# Patient Record
Sex: Female | Born: 1970 | Race: White | Hispanic: No | Marital: Married | Smoking: Never smoker
Health system: Southern US, Community
[De-identification: ages and names within clinical notes are randomized; demographics above are authoritative.]

## PROBLEM LIST (undated history)

## (undated) DIAGNOSIS — T4145XA Adverse effect of unspecified anesthetic, initial encounter: Secondary | ICD-10-CM

## (undated) DIAGNOSIS — Z923 Personal history of irradiation: Secondary | ICD-10-CM

## (undated) DIAGNOSIS — T8859XA Other complications of anesthesia, initial encounter: Secondary | ICD-10-CM

## (undated) DIAGNOSIS — F419 Anxiety disorder, unspecified: Secondary | ICD-10-CM

## (undated) DIAGNOSIS — M1990 Unspecified osteoarthritis, unspecified site: Secondary | ICD-10-CM

## (undated) DIAGNOSIS — C50919 Malignant neoplasm of unspecified site of unspecified female breast: Secondary | ICD-10-CM

## (undated) DIAGNOSIS — D689 Coagulation defect, unspecified: Secondary | ICD-10-CM

## (undated) DIAGNOSIS — Z9221 Personal history of antineoplastic chemotherapy: Secondary | ICD-10-CM

## (undated) DIAGNOSIS — D649 Anemia, unspecified: Secondary | ICD-10-CM

## (undated) DIAGNOSIS — F32A Depression, unspecified: Secondary | ICD-10-CM

## (undated) DIAGNOSIS — T7840XA Allergy, unspecified, initial encounter: Secondary | ICD-10-CM

## (undated) DIAGNOSIS — G809 Cerebral palsy, unspecified: Secondary | ICD-10-CM

## (undated) HISTORY — DX: Depression, unspecified: F32.A

## (undated) HISTORY — DX: Personal history of irradiation: Z92.3

## (undated) HISTORY — PX: EYE SURGERY: SHX253

## (undated) HISTORY — DX: Anemia, unspecified: D64.9

## (undated) HISTORY — DX: Allergy, unspecified, initial encounter: T78.40XA

## (undated) HISTORY — PX: LEG SURGERY: SHX1003

## (undated) HISTORY — DX: Malignant neoplasm of unspecified site of unspecified female breast: C50.919

## (undated) HISTORY — DX: Coagulation defect, unspecified: D68.9

## (undated) HISTORY — DX: Unspecified osteoarthritis, unspecified site: M19.90

## (undated) HISTORY — PX: BREAST BIOPSY: SHX20

---

## 2000-10-26 ENCOUNTER — Encounter: Admission: RE | Admit: 2000-10-26 | Discharge: 2000-10-26 | Payer: Self-pay | Admitting: Internal Medicine

## 2000-10-26 ENCOUNTER — Encounter: Payer: Self-pay | Admitting: Internal Medicine

## 2001-04-04 ENCOUNTER — Ambulatory Visit (HOSPITAL_COMMUNITY): Admission: RE | Admit: 2001-04-04 | Discharge: 2001-04-04 | Payer: Self-pay | Admitting: Obstetrics and Gynecology

## 2001-12-31 ENCOUNTER — Ambulatory Visit (HOSPITAL_COMMUNITY): Admission: RE | Admit: 2001-12-31 | Discharge: 2001-12-31 | Payer: Self-pay | Admitting: Obstetrics and Gynecology

## 2002-01-29 ENCOUNTER — Ambulatory Visit (HOSPITAL_COMMUNITY): Admission: RE | Admit: 2002-01-29 | Discharge: 2002-01-29 | Payer: Self-pay | Admitting: Obstetrics and Gynecology

## 2002-01-29 ENCOUNTER — Encounter: Payer: Self-pay | Admitting: Obstetrics and Gynecology

## 2003-05-18 ENCOUNTER — Ambulatory Visit (HOSPITAL_COMMUNITY): Admission: RE | Admit: 2003-05-18 | Discharge: 2003-05-18 | Payer: Self-pay | Admitting: Obstetrics and Gynecology

## 2004-07-03 HISTORY — PX: ABDOMINAL HYSTERECTOMY: SHX81

## 2006-03-08 ENCOUNTER — Encounter: Admission: RE | Admit: 2006-03-08 | Discharge: 2006-04-12 | Payer: Self-pay | Admitting: Family Medicine

## 2011-11-29 ENCOUNTER — Other Ambulatory Visit (HOSPITAL_COMMUNITY): Payer: Self-pay | Admitting: Family Medicine

## 2011-11-29 DIAGNOSIS — Z1231 Encounter for screening mammogram for malignant neoplasm of breast: Secondary | ICD-10-CM

## 2011-12-21 ENCOUNTER — Ambulatory Visit (HOSPITAL_COMMUNITY): Payer: Self-pay

## 2012-01-15 ENCOUNTER — Ambulatory Visit (HOSPITAL_COMMUNITY)
Admission: RE | Admit: 2012-01-15 | Discharge: 2012-01-15 | Disposition: A | Discharge status: Home / Self Care | Payer: PRIVATE HEALTH INSURANCE | Source: Ambulatory Visit | Attending: Family Medicine | Admitting: Family Medicine

## 2012-01-15 DIAGNOSIS — Z1231 Encounter for screening mammogram for malignant neoplasm of breast: Secondary | ICD-10-CM | POA: Insufficient documentation

## 2012-01-22 ENCOUNTER — Other Ambulatory Visit: Payer: Self-pay | Admitting: Family Medicine

## 2012-01-22 DIAGNOSIS — R928 Other abnormal and inconclusive findings on diagnostic imaging of breast: Secondary | ICD-10-CM

## 2012-02-01 ENCOUNTER — Other Ambulatory Visit: Payer: Self-pay | Admitting: Family Medicine

## 2012-02-01 ENCOUNTER — Ambulatory Visit
Admission: RE | Admit: 2012-02-01 | Discharge: 2012-02-01 | Disposition: A | Discharge status: Home / Self Care | Payer: PRIVATE HEALTH INSURANCE | Source: Ambulatory Visit | Attending: Family Medicine | Admitting: Family Medicine

## 2012-02-01 DIAGNOSIS — R928 Other abnormal and inconclusive findings on diagnostic imaging of breast: Secondary | ICD-10-CM

## 2012-02-02 ENCOUNTER — Other Ambulatory Visit: Payer: Self-pay | Admitting: Family Medicine

## 2012-02-02 ENCOUNTER — Ambulatory Visit
Admission: RE | Admit: 2012-02-02 | Discharge: 2012-02-02 | Disposition: A | Discharge status: Home / Self Care | Payer: PRIVATE HEALTH INSURANCE | Source: Ambulatory Visit | Attending: Family Medicine | Admitting: Family Medicine

## 2012-02-02 DIAGNOSIS — C50912 Malignant neoplasm of unspecified site of left female breast: Secondary | ICD-10-CM

## 2012-02-02 DIAGNOSIS — R928 Other abnormal and inconclusive findings on diagnostic imaging of breast: Secondary | ICD-10-CM

## 2012-02-06 ENCOUNTER — Telehealth (INDEPENDENT_AMBULATORY_CARE_PROVIDER_SITE_OTHER): Payer: Self-pay

## 2012-02-06 NOTE — Telephone Encounter (Signed)
Spoke to pt about moving her appt up earlier with Dr Dwain Sarna for 8/7 instead of 8/12. The pt will look at her schedule and get back in touch with me. I told the pt that I wouldn't change anything until she got back in touch with me.

## 2012-02-06 NOTE — Telephone Encounter (Signed)
Pt returned my call and she would like to keep her appt a lone for the 8/12. I notified Dr Dwain Sarna.

## 2012-02-08 ENCOUNTER — Other Ambulatory Visit: Payer: PRIVATE HEALTH INSURANCE

## 2012-02-11 ENCOUNTER — Ambulatory Visit
Admission: RE | Admit: 2012-02-11 | Discharge: 2012-02-11 | Disposition: A | Discharge status: Home / Self Care | Payer: PRIVATE HEALTH INSURANCE | Source: Ambulatory Visit | Attending: Family Medicine | Admitting: Family Medicine

## 2012-02-11 DIAGNOSIS — C50912 Malignant neoplasm of unspecified site of left female breast: Secondary | ICD-10-CM

## 2012-02-11 MED ORDER — GADOBENATE DIMEGLUMINE 529 MG/ML IV SOLN
14.0000 mL | Freq: Once | INTRAVENOUS | Status: AC | PRN
  Administered 2012-02-11: 14 mL via INTRAVENOUS

## 2012-02-12 ENCOUNTER — Encounter (INDEPENDENT_AMBULATORY_CARE_PROVIDER_SITE_OTHER): Payer: Self-pay | Admitting: General Surgery

## 2012-02-12 ENCOUNTER — Ambulatory Visit (INDEPENDENT_AMBULATORY_CARE_PROVIDER_SITE_OTHER): Payer: PRIVATE HEALTH INSURANCE | Admitting: General Surgery

## 2012-02-12 VITALS — BP 130/70 | HR 88 | Temp 98.0°F | Resp 12 | Ht 60.0 in | Wt 157.2 lb

## 2012-02-12 DIAGNOSIS — C50419 Malignant neoplasm of upper-outer quadrant of unspecified female breast: Secondary | ICD-10-CM

## 2012-02-12 NOTE — Progress Notes (Signed)
Patient ID: Alicia Pena, female   DOB: 1970/07/13, 41 y.o.   MRN: [id number]  Chief Complaint  Patient presents with  . Other    eval lt brca    HPI Alicia Pena is a 41 y.o. female.  Referred by Dr. Guinevere Ferrari HPI 41 yof who is otherwise healthy has had left breast upper outer quadrant dimpling since January. This has gotten worse over this time.  She underwent her first mmg recently with finding of a left breast spiculated mass.  She underwent more mmg with no other findings.  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  She has also undergone mr that shows multiple other masses on both sides but no adenopathy. She comes in today to discuss her options.  Past Medical History  Diagnosis Date  . Anemia     Past Surgical History  Procedure Date  . Abdominal hysterectomy   . Leg surgery   . Eye surgery     as a child    Family History  Problem Relation Age of Onset  . Cancer Father 35    lung  . Diabetes Maternal Grandmother   . Hyperlipidemia Maternal Grandmother   . Hypertension Maternal Grandmother     Social History History  Substance Use Topics  . Smoking status: Never Smoker   . Smokeless tobacco: Never Used  . Alcohol Use: No    Allergies  Allergen Reactions  . Duricef (Cefadroxil) Hives and Nausea Only    No current outpatient prescriptions on file.    Review of Systems Review of Systems  Constitutional: Negative for fever, chills and unexpected weight change.  HENT: Negative for hearing loss, congestion, sore throat, trouble swallowing and voice change.   Eyes: Negative for visual disturbance.  Respiratory: Negative for cough and wheezing.   Cardiovascular: Negative for chest pain, palpitations and leg swelling.  Gastrointestinal: Negative for nausea, vomiting, abdominal pain, diarrhea, constipation, blood in stool, abdominal distention and anal bleeding.  Genitourinary: Negative  for hematuria, vaginal bleeding and difficulty urinating.  Musculoskeletal: Negative for arthralgias.  Skin: Negative for rash and wound.  Neurological: Negative for seizures, syncope and headaches.  Hematological: Negative for adenopathy. Does not bruise/bleed easily.  Psychiatric/Behavioral: Negative for confusion.    Blood pressure 130/70, pulse 88, temperature 98 F (36.7 C), temperature source Temporal, resp. rate 12, height 5' (1.524 m), weight 157 lb 3.2 oz (71.305 kg).  Physical Exam Physical Exam  Vitals reviewed. Constitutional: She appears well-developed and well-nourished.  Cardiovascular: Normal rate, regular rhythm and normal heart sounds.   Pulmonary/Chest: Effort normal and breath sounds normal. She has no wheezes. She has no rales. Right breast exhibits no inverted nipple, no mass, no nipple discharge, no skin change and no tenderness. Left breast exhibits mass. Left breast exhibits no inverted nipple, no nipple discharge, no skin change and no tenderness. Breasts are symmetrical.    Lymphadenopathy:    She has no cervical adenopathy.    She has no axillary adenopathy.       Right: No supraclavicular adenopathy present.       Left: No supraclavicular adenopathy present.    Data Reviewed MRI, MMR and u/s Pathology report reviewed and provided to patient  Assessment    Left breast cancer    Plan    Genetic testing, second look u/s and biopsy of mr found lesions, oncology appt Then I will see back for discussion of surgery  We discussed the staging and pathophysiology of breast cancer. We discussed all of the different options for treatment for breast cancer including surgery, chemotherapy, radiation therapy, Herceptin, and antiestrogen therapy.   We discussed a sentinel lymph node biopsy as she does not appear to having lymph node involvement right now. We discussed the performance of that with injection of radioactive tracer and blue dye. We discussed that  she would have an incision underneath her axillary hairline. We discussed that there is a bout a 10-20% chance of having a positive node with a sentinel lymph node biopsy and we will await the permanent pathology to make any other first further decisions in terms of her treatment. One of these options might be to return to the operating room to perform an axillary lymph node dissection. We discussed about a 1-2% risk lifetime of chronic shoulder pain as well as lymphedema associated with a sentinel lymph node biopsy.  We discussed the options for treatment of the breast cancer which included lumpectomy versus a mastectomy. We discussed the performance of the lumpectomy with a wire placement. We discussed a 10-20% chance of a positive margin requiring reexcision in the operating room. We also discussed that she may need radiation therapy or antiestrogen therapy or both if she undergoes lumpectomy. We discussed the mastectomy and the postoperative care for that as well. We discussed that there is no difference in her survival whether she undergoes lumpectomy with radiation therapy or antiestrogen therapy versus a mastectomy. There is a slight difference in the local recurrence rate being 3-5% with lumpectomy and about 1% with a mastectomy. i am not sure she is candidate for bct until we get other biopsies. We discussed the risks of operation including bleeding, infection, possible reoperation. She understands her further therapy will be based on what her stages at the time of her operation.        Alicia Pena 02/12/2012, 10:14 PM

## 2012-02-14 ENCOUNTER — Telehealth: Payer: Self-pay | Admitting: *Deleted

## 2012-02-14 NOTE — Telephone Encounter (Signed)
Confirmed 02/20/12 genetic appt & 02/27/12 med onc appt w/ pt.  Emailed Alisha at [facility] to make aware.   Mailed before appt letter & packet to pt.  Took paperwork to Med Rec for chart.

## 2012-02-15 ENCOUNTER — Other Ambulatory Visit (INDEPENDENT_AMBULATORY_CARE_PROVIDER_SITE_OTHER): Payer: Self-pay | Admitting: General Surgery

## 2012-02-15 ENCOUNTER — Ambulatory Visit
Admission: RE | Admit: 2012-02-15 | Discharge: 2012-02-15 | Disposition: A | Discharge status: Home / Self Care | Payer: PRIVATE HEALTH INSURANCE | Source: Ambulatory Visit | Attending: General Surgery | Admitting: General Surgery

## 2012-02-15 DIAGNOSIS — C50419 Malignant neoplasm of upper-outer quadrant of unspecified female breast: Secondary | ICD-10-CM

## 2012-02-15 HISTORY — PX: OTHER SURGICAL HISTORY: SHX169

## 2012-02-16 ENCOUNTER — Other Ambulatory Visit: Payer: Self-pay | Admitting: *Deleted

## 2012-02-16 DIAGNOSIS — C50419 Malignant neoplasm of upper-outer quadrant of unspecified female breast: Secondary | ICD-10-CM | POA: Insufficient documentation

## 2012-02-19 ENCOUNTER — Ambulatory Visit
Admission: RE | Admit: 2012-02-19 | Discharge: 2012-02-19 | Disposition: A | Discharge status: Home / Self Care | Payer: PRIVATE HEALTH INSURANCE | Source: Ambulatory Visit | Attending: General Surgery | Admitting: General Surgery

## 2012-02-19 ENCOUNTER — Telehealth: Payer: Self-pay | Admitting: Oncology

## 2012-02-19 ENCOUNTER — Ambulatory Visit (INDEPENDENT_AMBULATORY_CARE_PROVIDER_SITE_OTHER): Payer: PRIVATE HEALTH INSURANCE | Admitting: General Surgery

## 2012-02-19 ENCOUNTER — Encounter (INDEPENDENT_AMBULATORY_CARE_PROVIDER_SITE_OTHER): Payer: Self-pay | Admitting: General Surgery

## 2012-02-19 ENCOUNTER — Other Ambulatory Visit (INDEPENDENT_AMBULATORY_CARE_PROVIDER_SITE_OTHER): Payer: Self-pay | Admitting: General Surgery

## 2012-02-19 VITALS — BP 138/76 | HR 88 | Temp 98.2°F | Resp 16 | Ht 60.0 in | Wt 156.2 lb

## 2012-02-19 DIAGNOSIS — N6320 Unspecified lump in the left breast, unspecified quadrant: Secondary | ICD-10-CM

## 2012-02-19 DIAGNOSIS — N6310 Unspecified lump in the right breast, unspecified quadrant: Secondary | ICD-10-CM

## 2012-02-19 DIAGNOSIS — C50419 Malignant neoplasm of upper-outer quadrant of unspecified female breast: Secondary | ICD-10-CM

## 2012-02-19 MED ORDER — LORAZEPAM 1 MG PO TABS: 1.0000 mg | ORAL_TABLET | Freq: Three times a day (TID) | ORAL | Status: DC

## 2012-02-19 NOTE — Progress Notes (Signed)
Subjective:     Patient ID: Alicia Pena, female   DOB: [birth year], 41 y.o.   MRN: [id number]  HPI This is a 41 year old female I saw proceed with a newly diagnosed left breast cancer. She had an MRI with a number of other abnormalities. She went back for second look ultrasound and had an additional area on the right biopsied it was a fibroadenoma. There is one other area on the right side that was not biopsied as it could not be seen on ultrasound. She also had an additional area biopsy on the left which shows this to be an invasive cancer. She stopped by today just to go over these results and discuss indications for more biopsies. She is also due to see genetics tomorrow and medical oncology next week.  Review of Systems  **ADDENDUM** CREATED: 02/13/2012 09:10:09  CORRECTED REPORT:  In the findings section of the report, the report should read:  Right breast:  1. There is a 0.9 x 0.9 x 1.3 cm enhancing ovoid mass in the upper  inner quadrant of the right breast.  Addended by: Rolla Plate, M.D. on 02/13/2012 09:10:09.  **END ADDENDUM** SIGNED BY: Rolla Plate, M.D.      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 Three dimensional images were evaluated at  the independent DynaCad workstation.  Comparison: Mammograms dated 02/01/2012 and 01/15/2012  Findings: There is a moderate background parenchymal enhancement  pattern.  Right breast:  1. There is a 9 x 9 x 1.3 cm enhancing ovoid mass in the upper  inner quadrant of the right breast.  2. In the central portion of the right breast there is a second  enhancing mass measuring 7 x 6 x 3 mm.  Left breast:   1. There is a 2.3 x 2.7 x 2.8 cm spiculated mass in the upper  outer quadrant of the left breast that is associated with a clip  artifact. This is concordant with the recently diagnosed invasive  ductal carcinoma.  2. 2.2 cm anterior to the known area of malignancy is a second  enhancing mass measuring 9 x 7 x 6 mm.  3. There is a third mass located 2.1 cm anterior to the known area  of malignancy measuring 8 x 7 x 5 mm.  4. There is a satellite spiculated mass located 6 mm anterior to  the known malignancy measuring 8 x 7 x 8 mm.  5. There is a 4 mm rounded mass in the 12 o'clock subareolar  location of the left breast.  6. The left nipple is retracted and the pectoralis muscle is  pulled anteriorly.  No enlarged axillary or internal mammary adenopathy is detected.  IMPRESSION:  1. Biopsy-proven carcinoma in the upper outer quadrant of the left  breast for which treatment planning is recommended.  2. Two additional masses are seen in the right breast and four  additional masses are seen in the left breast. Bilateral breast  ultrasound with possible ultrasound guided core biopsies of both  breast is recommended. Findings were discussed with Dr. Dwain Sarna.  RECOMMENDATION:  Ultrasound of both breast with possible biopsies.        Objective:   Physical Exam  Deferred     Assessment:     Left breast cancer    Plan:     I discussed with her and her in today her diagnosis again. I answered all of her questions. She needs an additional biopsy of this right sided lesion neck and only be done by MR guidance. I did give her prescription for a dose of Ativan to take before this to her nervousness. She is also due to see genetics as well. I think at least right now she is still a candidate to undergo a lumpectomy on the left side if she so chooses. If that was going to be the case and she will need some additional areas biopsied by MR and I'm going to have to discuss with the radiologist  first as well. She was seen medical oncology next week also. I will follow her up after this and will call to set up the MRI guided biopsy.

## 2012-02-19 NOTE — Telephone Encounter (Signed)
The patient called to verify her appt with the genetic counselor and Dr. Donnie Coffin.   She had many questions about the process and reasons and I spent about 20 minutes explaining to her.   She verbalized understanding.

## 2012-02-20 ENCOUNTER — Other Ambulatory Visit: Payer: PRIVATE HEALTH INSURANCE | Admitting: Lab

## 2012-02-20 ENCOUNTER — Encounter: Payer: Self-pay | Admitting: Genetic Counselor

## 2012-02-20 ENCOUNTER — Ambulatory Visit (HOSPITAL_BASED_OUTPATIENT_CLINIC_OR_DEPARTMENT_OTHER): Payer: PRIVATE HEALTH INSURANCE | Admitting: Genetic Counselor

## 2012-02-20 DIAGNOSIS — Z801 Family history of malignant neoplasm of trachea, bronchus and lung: Secondary | ICD-10-CM

## 2012-02-20 DIAGNOSIS — C50419 Malignant neoplasm of upper-outer quadrant of unspecified female breast: Secondary | ICD-10-CM

## 2012-02-20 DIAGNOSIS — C50919 Malignant neoplasm of unspecified site of unspecified female breast: Secondary | ICD-10-CM

## 2012-02-20 NOTE — Progress Notes (Signed)
Dr. Dwain Sarna requested a consultation for genetic counseling and risk assessment for Alicia Pena, a 41 y.o. female, for discussion of her personal history of invasive ductal carcinoma. She presents to clinic today with her aunt to discuss the possibility of a genetic predisposition to cancer, and to further clarify her risks, as well as her family members' risks for cancer.   HISTORY OF PRESENT ILLNESS: In July 2013, at the age of 48, Alicia Pena was diagnosed with invasive ductal carcinoma of the breast.    Past Medical History  Diagnosis Date  . Anemia   . Breast cancer     Past Surgical History  Procedure Date  . Abdominal hysterectomy   . Leg surgery   . Eye surgery     as a child    History  Substance Use Topics  . Smoking status: Never Smoker   . Smokeless tobacco: Never Used  . Alcohol Use: No    REPRODUCTIVE HISTORY AND PERSONAL RISK ASSESSMENT FACTORS: Menarche was at age 36.   Premenopausal Uterus Intact: No; removed in 2006 b/c of fibroids Ovaries Intact: Yes [id number] , first live birth at age N/A  She has not previously undergone treatment for infertility.   OCP use for 6 months to help shrink fibroid tumors   She has not used HRT in the past.    FAMILY HISTORY:  We obtained a detailed, 4-generation family history.  Significant diagnoses are listed below: Family History  Problem Relation Age of Onset  . Cancer Father 21    lung  . Diabetes Maternal Grandmother   . Hyperlipidemia Maternal Grandmother   . Hypertension Maternal Grandmother   The patient was diagnosed with breast cancer at age 8.  She is the only family member with breast cancer.  Her father was diagnosed with lung cancer which is attributed to his smoking.  Patient's maternal ancestors are of unknown descent, and paternal ancestors are of unknown descent. There is no reported Ashkenazi Jewish ancestry. There is no  known consanguinity.  GENETIC COUNSELING RISK ASSESSMENT, DISCUSSION,  AND SUGGESTED FOLLOW UP: We reviewed the natural history and genetic etiology of sporadic, familial and hereditary cancer syndromes.  About 5-10% of breast cancer is hereditary.  Of this, about 85% is the result of a BRCA1 or BRCA2 mutation.  We reviewed the red flags of hereditary cancer syndromes and the dominant inheritance patterns.   The patient's personal history of breast cancer is suggestive of the following possible diagnosis: hereditary cancer syndrome  We discussed that identification of a hereditary cancer syndrome may help her care providers tailor the patients medical management. If a mutation indicating a hereditary cancer syndrome is detected in this case, the [employer] recommendations would include increased cancer surveillience and possible prophylactic surgery. If a mutation is detected, the patient will be referred back to the referring provider and to any additional appropriate care providers to discuss the relevant options.   If a mutation is not found in the patient, this will decrease the likelihood of a hereditary cancer syndrome as the explanation for her breast cancer. Cancer surveillance options would be discussed for the patient according to the appropriate standard National Comprehensive Cancer Network and American Cancer Society guidelines, with consideration of their personal and family history risk factors. In this case, the patient will be referred back to their care providers for discussions of management.   In order to estimate her chance of having a BRCA1 or BRCA2 mutation, we used statistical  models (Penn II) and laboratory data that take into account her personal medical history, family history and ancestry.  Because each model is different, there can be a lot of variability in the risks they give.  Therefore, these numbers must be considered a rough range and not a precise risk of having a BRCA1 or BRCA2 mutation.  These models estimate  that she has approximately a 10% chance of having a mutation. Based on this assessment of her family and personal history, genetic testing is recommended.  After considering the risks, benefits, and limitations, the patient provided informed consent for  the following  Testing:  [facility] through [facility].   Per the patient's request, we will contact her by telephone to discuss these results. A follow up genetic counseling visit will be scheduled if indicated.  The patient was seen for a total of 45 minutes, greater than 50% of which was spent face-to-face counseling.  This plan is being carried out per Dr. Dwain Sarna recommendations.  This note will also be sent to the referring provider via the electronic medical record. The patient will be supplied with a summary of this genetic counseling discussion as well as educational information on the discussed hereditary cancer syndromes following the conclusion of their visit.   Patient was discussed with Dr. Drue Second.  EDUCATIONAL INFORMATION SUPPLIED TO PATIENT AT ENCOUNTER:  Hereditary Breast and Ovarian Cancer Syndrome brochure    _______________________________________________________________________ For Office Staff:  Number of people involved in session: 3 Was an Intern/ student involved with case: not applicable

## 2012-02-21 ENCOUNTER — Telehealth (INDEPENDENT_AMBULATORY_CARE_PROVIDER_SITE_OTHER): Payer: Self-pay

## 2012-02-21 ENCOUNTER — Other Ambulatory Visit (INDEPENDENT_AMBULATORY_CARE_PROVIDER_SITE_OTHER): Payer: Self-pay | Admitting: General Surgery

## 2012-02-21 DIAGNOSIS — C50919 Malignant neoplasm of unspecified site of unspecified female breast: Secondary | ICD-10-CM

## 2012-02-21 NOTE — Telephone Encounter (Signed)
Called Olegario Messier to let her know that I have orders in epic for pt to get bilateral MR bx's to be scheduled. Olegario Messier will call me with pt's appt dates.

## 2012-02-27 ENCOUNTER — Other Ambulatory Visit (HOSPITAL_BASED_OUTPATIENT_CLINIC_OR_DEPARTMENT_OTHER): Payer: PRIVATE HEALTH INSURANCE | Admitting: Lab

## 2012-02-27 ENCOUNTER — Ambulatory Visit (HOSPITAL_BASED_OUTPATIENT_CLINIC_OR_DEPARTMENT_OTHER): Payer: PRIVATE HEALTH INSURANCE | Admitting: Oncology

## 2012-02-27 ENCOUNTER — Ambulatory Visit: Payer: PRIVATE HEALTH INSURANCE

## 2012-02-27 VITALS — BP 139/95 | HR 83 | Temp 97.7°F | Resp 20 | Ht 60.0 in | Wt 155.9 lb

## 2012-02-27 DIAGNOSIS — D249 Benign neoplasm of unspecified breast: Secondary | ICD-10-CM

## 2012-02-27 DIAGNOSIS — C50919 Malignant neoplasm of unspecified site of unspecified female breast: Secondary | ICD-10-CM

## 2012-02-27 DIAGNOSIS — C50419 Malignant neoplasm of upper-outer quadrant of unspecified female breast: Secondary | ICD-10-CM

## 2012-02-27 LAB — CANCER ANTIGEN 27.29: CA 27.29: 12 U/mL (ref 0–39)

## 2012-02-27 LAB — CBC WITH DIFFERENTIAL/PLATELET
BASO%: 0.4 % (ref 0.0–2.0)
EOS%: 1.3 % (ref 0.0–7.0)
HCT: 38 % (ref 34.8–46.6)
LYMPH%: 23.8 % (ref 14.0–49.7)
MCH: 23.4 pg — ABNORMAL LOW (ref 25.1–34.0)
MCHC: 31.9 g/dL (ref 31.5–36.0)
NEUT%: 69.1 % (ref 38.4–76.8)
Platelets: 271 10*3/uL (ref 145–400)
lymph#: 2.1 10*3/uL (ref 0.9–3.3)

## 2012-02-27 LAB — COMPREHENSIVE METABOLIC PANEL (CC13)
AST: 14 U/L (ref 5–34)
Creatinine: 0.6 mg/dL (ref 0.6–1.1)
Total Bilirubin: 0.9 mg/dL (ref 0.20–1.20)
Total Protein: 7.5 g/dL (ref 6.4–8.3)

## 2012-02-27 NOTE — Progress Notes (Signed)
Referral MD  Reason for Referral: Locally advanced breast cancer    No chief complaint on file. : Left breast dimpling   HPI:   Past Medical History  Diagnosis Date  . Anemia   . Breast cancer   : This is a 41 year old woman from [place] who was referred by Dr. Dwain Sarna for consideration of neoadjuvant therapy. She relates having had left breast implant back in January of this year. She notes progression of this finding. She ultimately was referred for a mammogram. Mammogram and left breast ultrasound performed 02/01/2012 demonstrated a mass in the left breast which was confirmed by physical exam. This measured about 3 cm by palpation. Ultrasound showed a hypoechoic mass with posterior shadowing 1:00 position measuring 1.9 x 1.1 x 1.2 cm. 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  A subsequent MRI scan of both breasts was performed on 02/11/12,   multiple findings were identified as detailed below.  Right breast:  1. There is a 9 x 9 x 1.3 cm enhancing ovoid mass in the upper  inner quadrant of the right breast.  2. In the central portion of the right breast there is a second  enhancing mass measuring 7 x 6 x 3 mm.  Left breast:  1. There is a 2.3 x 2.7 x 2.8 cm spiculated mass in the upper  outer quadrant of the left breast that is associated with a clip  artifact. This is concordant with the recently diagnosed invasive  ductal carcinoma.  2. 2.2 cm anterior to the known area of malignancy is a second  enhancing mass measuring 9 x 7 x 6 mm.  3. There is a third mass located 2.1 cm anterior to the known area  of malignancy measuring 8 x 7 x 5 mm.  4. There is a satellite spiculated mass located 6 mm anterior to  the known malignancy measuring 8 x 7 x 8 mm.  5. There is a 4 mm rounded mass in the 12 o'clock subareolar  location of the left breast.  6. The  left nipple is retracted and the pectoralis muscle is  pulled anteriorly.  The patient has returned and had a biopsy of a lesion in the left breast anterior to the index lesion. This all showed grade 2 ductal cancer ER PR positive percent respectively without HER-2 amplification. A biopsy of the right side showed benign disease. She is due for additional biopsies of lesions both the right and left breasts.     Past Surgical History  Procedure Date  . Abdominal hysterectomy, without oophorectomy   2006  . Leg surgery   . Eye surgery     as a child  :  Current outpatient prescriptions:LORazepam (ATIVAN) 1 MG tablet, Take 1 tablet (1 mg total) by mouth every 8 (eight) hours. Take one tablet prior to mri, Disp: 2 tablet, Rfl: 0:    :  Allergies  Allergen Reactions  . Duricef (Cefadroxil) Hives and Nausea Only  :  Family History  Problem Relation Age of Onset  . Cancer Father 29    lung  . Diabetes Maternal Grandmother   . Hyperlipidemia Maternal Grandmother   . Hypertension Maternal Grandmother   :  History   Social History  . Marital Status: Single    Spouse Name: N/A    Number of Children: 0  . Years of Education: N/A  The patient moved to [place] and after graduating from high school. She has worked for him [place] for the past 18 years. She was adopted at the age of 50. She currently lives with her birth mother who has a mental capacity of 6-year-old. Occupational History  . Not on file.   Social History Main Topics  . Smoking status: Never Smoker   . Smokeless tobacco: Never Used  . Alcohol Use: No  . Drug Use: No  . Sexually Active: Not on file   Other Topics Concern  . Not on file, there is no other history of breast or ovarian cancer in the family. She is one sister was in good health.    Social History Narrative  . 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 ,   :  G0 P0  She did use birth control  pills for 6 months  Review of systems   denies headaches blurred vision shortness of breath or cough denies any nausea vomiting numbness in hands and feet.   Exam: Short physical habitus  HEENT:  Sclerae anicteric, conjunctivae pink.  Oropharynx clear.  No mucositis or candidiasis.  Nodes:  No cervical, supraclavicular, or axillary lymphadenopathy palpated.  Breast Exam:  Right breast is benign.  No masses, discharge, skin change, or nipple inversion.  Left breast has an indentation in the lateral portion of the breast with retraction of the nipple point is laterally. There is a palpable mass the left breast measuring about 3 cm on the upper outer quadrant. I cannot appreciate any axillary adenopathy. The right breast there may be a firm a little area in the central portion of the breast.  Lungs:  Clear to auscultation bilaterally.  No crackles, rhonchi, or wheezes.  Heart:  Regular rate and rhythm.  Abdomen:  Soft, nontender.  Positive bowel sounds.  No organomegaly or masses palpated.  Musculoskeletal:  No focal spinal tenderness to palpation. Wide-base gait Extremities:  Benign.  No peripheral edema or cyanosis.  Skin:  Benign.  Neuro:  Nonfocal.    Basename 02/27/12 1611  WBC 8.7  HGB 12.1  HCT 38.0  PLT 271    Basename 02/27/12 1611  NA 137  K 3.6  CL 106  CO2 22  GLUCOSE 84  BUN 10.0  CREATININE 0.6  CALCIUM 8.9    Blood smear review: NA   Pathology:As above   US Breast Left  02/01/2012  *RADIOLOGY REPORT*  Clinical Data:  Abnormal screening.  DIGITAL DIAGNOSTIC BILATERAL MAMMOGRAM WITHOUT CAD AND LEFT BREAST ULTRASOUND:  Comparison:  None.  Findings:  Spot compression views in the upper-outer quadrant of the left breast, posteriorly confirm the presence of a a round high- density mass with spiculated margins and overlying skin retraction. 90 degree lateral and spot compression views in the subareolar region of the right breast demonstrate normal tissue without persistent mass.   On physical exam, I see an area of dimpling in the 1 o'clock position of the left areola with the lying patient supine.  When the patient is upright, I see skin dimpling and retraction in the 1 o'clock position of the left breast, 4 cm from the nipple.  I palpate a firm, readily mobile mass in the 1 o'clock position of the left breast approximately 3 cm by palpation.  Ultrasound is performed, showing an irregular, hypoechoic mass with ill-defined margins and posterior shadowing in the 1 o'clock position, 4 cm the nipple measuring  1.9 x 1.1 x 1.2 cm.  Normal axillary contents are imaged.  IMPRESSION: Mass, left breast for which a biopsy is done. No mammographic evidence of malignancy, right breast.  RECOMMENDATION: Ultrasound guided left breast biopsy.  BI-RADS CATEGORY 4:  Suspicious abnormality - biopsy should be considered.  Original Report Authenticated By: Hiram Gash, M.D.   US Breast Bilateral  02/15/2012  *RADIOLOGY REPORT*  Clinical Data:  Abnormal MRI.  BILATERAL BREAST ULTRASOUND  Comparison:  Multiple priors most recently 02/01/2012  On physical exam of the right breast, I palpate no mass. Sonographically, an oval heterogeneously hypoechoic mass with circumscribed margins is imaged in the 1 o'clock position, 3 cm from the nipple measuring 0.9 x 0.6 x 1.0 cm.  This corresponds to one the enhancing mass is seen mammographically.  The second mass seen on the MRI in the central portion of the right breast is not seen sonographically.  On physical exam of the left breast, a firm mass is palpated in the 1 o'clock position, 4 cm the nipple.  No additional masses are palpable today.  Sonographically, an ill-defined hypoechoic mass with posterior shadowing is imaged in the 1 o'clock position, 3 cm the nipple measuring 0.5 x 0.4 x 0.3 cm.  Additionally, an oval hypoechoic mass with shadowing is imaged in the 12 o'clock position, 2 cm the nipple measuring 0.5 x 0.4 x 0.4 cm.  IMPRESSION: Bilateral breast  masses for which biopsies are done and reported separately.  RECOMMENDATION: Ultrasound guided breast biopsies.  BI-RADS CATEGORY 4:  Suspicious abnormality - biopsy should be considered.  Original Report Authenticated By: Hiram Gash, M.D.   Mr Breast Bilateral W Wo Contrast  02/13/2012  **ADDENDUM** CREATED: 02/13/2012 09:10:09  CORRECTED REPORT:  In the findings section of the report, the report should read:  Right breast: 1.  There is a 0.9 x 0.9 x 1.3 cm enhancing ovoid mass in the upper inner quadrant of the right breast.  Addended by:  Rolla Plate, M.D. on 02/13/2012 09:10:09.  **END ADDENDUM** SIGNED BY: Rolla Plate, M.D.   02/12/2012  *RADIOLOGY REPORT*  Clinical Data: 41 year old female with biopsy-proven invasive ductal carcinoma in the upper outer quadrant of the left breast  BILATERAL BREAST MRI WITH AND WITHOUT CONTRAST  Technique: Multiplanar, multisequence MR images of both breasts were obtained prior to and following the intravenous administration of 14ml of multihance.  Three dimensional images were evaluated at the independent DynaCad workstation.  Comparison:  Mammograms dated 02/01/2012 and 01/15/2012  Findings: There is a moderate background parenchymal enhancement pattern.  Right breast: 1.  There is a 9 x 9 x 1.3 cm enhancing ovoid mass in the upper inner quadrant of the right breast. 2.  In the central portion of the right breast there is a second enhancing mass measuring 7 x 6 x 3 mm.  Left breast: 1.  There is a 2.3 x 2.7 x 2.8 cm spiculated mass in the upper outer quadrant of the left breast that is associated with a clip artifact.  This is concordant with the recently diagnosed invasive ductal carcinoma. 2.   2.2 cm anterior to the known area of malignancy is a second enhancing mass measuring 9 x 7 x 6 mm. 3.  There is a third mass located 2.1 cm anterior to the known area of malignancy measuring 8 x 7 x 5 mm. 4.  There is a satellite spiculated mass located 6 mm  anterior to the known malignancy measuring 8 x  7 x 8 mm. 5.  There is a 4 mm rounded mass in the 12 o'clock subareolar location of the left breast. 6.  The left nipple is retracted and the pectoralis muscle is pulled anteriorly.  No enlarged axillary or internal mammary adenopathy is detected.  IMPRESSION:  1.  Biopsy-proven carcinoma in the upper outer quadrant of the left breast for which treatment planning is recommended. 2. Two additional masses are seen in the right breast and four additional masses are seen in the left breast.  Bilateral breast ultrasound with possible ultrasound guided core biopsies of both breast is recommended.  Findings were discussed with Dr. Dwain Sarna.  RECOMMENDATION: Ultrasound of both breast with possible biopsies.  THREE-DIMENSIONAL MR IMAGE RENDERING ON INDEPENDENT WORKSTATION:  Three-dimensional MR images were rendered by post-processing of the original MR data on an independent workstation.  The three- dimensional MR images were interpreted, and findings were reported in the accompanying complete MRI report for this study.  BI-RADS CATEGORY 4:  Suspicious abnormality - biopsy should be considered.  Original Report Authenticated By: Rolla Plate, M.D.   [place] Core Biopsy  02/15/2012  *RADIOLOGY REPORT*  Clinical Data:  Abnormal MRI  ULTRASOUND GUIDED CORE BIOPSY OF THE LEFT AND RIGHT BREAST  Comparison: Previous exams.  I met with the patient and we discussed the procedure of ultrasound- guided biopsy, including benefits and alternatives.  We discussed the high likelihood of a successful procedure. We discussed the risks of the procedure, including infection, bleeding, tissue injury, clip migration, and inadequate sampling.  Informed written consent was given.  Using sterile technique 2% lidocaine, ultrasound guidance and a 14 gauge automated biopsy device, biopsy was performed of right breast mass using a lateral approach.  At the conclusion of the procedure a ribbon tissue  marker clip was deployed into the biopsy cavity. Follow up 2 view mammogram was performed and dictated separately.  Using sterile technique 2% lidocaine, ultrasound guidance and a 14 gauge automated biopsy device, biopsy was performed of left breast mass using a lateral approach.  At the conclusion of the procedure a  coil tissue marker clip was deployed into the biopsy cavity.  Follow up 2 view mammogram was performed and dictated separately.  IMPRESSION: Ultrasound guided biopsy of bilateral breast masses.  No apparent complications.  Original Report Authenticated By: Hiram Gash, M.D.   [place] Core Biopsy  02/15/2012  *RADIOLOGY REPORT*  Clinical Data:  Abnormal MRI  ULTRASOUND GUIDED CORE BIOPSY OF THE LEFT AND RIGHT BREAST  Comparison: Previous exams.  I met with the patient and we discussed the procedure of ultrasound- guided biopsy, including benefits and alternatives.  We discussed the high likelihood of a successful procedure. We discussed the risks of the procedure, including infection, bleeding, tissue injury, clip migration, and inadequate sampling.  Informed written consent was given.  Using sterile technique 2% lidocaine, ultrasound guidance and a 14 gauge automated biopsy device, biopsy was performed of right breast mass using a lateral approach.  At the conclusion of the procedure a ribbon tissue marker clip was deployed into the biopsy cavity. Follow up 2 view mammogram was performed and dictated separately.  Using sterile technique 2% lidocaine, ultrasound guidance and a 14 gauge automated biopsy device, biopsy was performed of left breast mass using a lateral approach.  At the conclusion of the procedure a  coil tissue marker clip was deployed into the biopsy cavity.  Follow up 2 view mammogram was performed and dictated separately.  IMPRESSION: Ultrasound guided biopsy  of bilateral breast masses.  No apparent complications.  Original Report Authenticated By: Hiram Gash, M.D.   [place]  Core Biopsy  02/01/2012  *RADIOLOGY REPORT*  Clinical Data:  Left breast mass  ULTRASOUND GUIDED CORE BIOPSY OF THE LEFT BREAST  Comparison: Previous exams  I met with the patient and we discussed the procedure of ultrasound- guided biopsy, including benefits and alternatives.  We discussed the high likelihood of a successful procedure. We discussed the risks of the procedure, including infection, bleeding, tissue injury, clip migration, and inadequate sampling.  Informed written consent was given.  Using sterile technique 2% lidocaine, ultrasound guidance and a 14 gauge automated biopsy device, biopsy was performed of the mass using a lateral approach.  At the conclusion of the procedure a a ribbon tissue marker clip was deployed into the biopsy cavity. Follow up 2 view mammogram was performed and dictated separately.  IMPRESSION: Ultrasound guided biopsy of a left breast mass.  No apparent complications.  Original Report Authenticated By: Hiram Gash, M.D.   Mm Digital Diagnostic Bilat  02/15/2012  *RADIOLOGY REPORT*  Clinical Data:  Abnormal MRI.  DIGITAL DIAGNOSTIC BILATERAL MAMMOGRAM  Comparison:  Previous exams.  Findings:  Films are performed following ultrasound guided biopsy of a right breast mass and a left breast mass.  Images show a ribbon type clip in the upper outer quadrant of the right breast, middle third in association with a mass.  In the left breast, a coil type clip is seen in the upper outer quadrant of the left breast, 2.4 cm anterior to the known malignancy.  IMPRESSION: Adequate clip placement following ultrasound guided bilateral breast biopsies.  Original Report Authenticated By: Hiram Gash, M.D.   Mm Digital Diagnostic Bilat Ltd  02/01/2012  *RADIOLOGY REPORT*  Clinical Data:  Abnormal screening.  DIGITAL DIAGNOSTIC BILATERAL MAMMOGRAM WITHOUT CAD AND LEFT BREAST ULTRASOUND:  Comparison:  None.  Findings:  Spot compression views in the upper-outer quadrant of the left  breast, posteriorly confirm the presence of a a round high- density mass with spiculated margins and overlying skin retraction. 90 degree lateral and spot compression views in the subareolar region of the right breast demonstrate normal tissue without persistent mass.  On physical exam, I see an area of dimpling in the 1 o'clock position of the left areola with the lying patient supine.  When the patient is upright, I see skin dimpling and retraction in the 1 o'clock position of the left breast, 4 cm from the nipple.  I palpate a firm, readily mobile mass in the 1 o'clock position of the left breast approximately 3 cm by palpation.  Ultrasound is performed, showing an irregular, hypoechoic mass with ill-defined margins and posterior shadowing in the 1 o'clock position, 4 cm the nipple measuring 1.9 x 1.1 x 1.2 cm.  Normal axillary contents are imaged.  IMPRESSION: Mass, left breast for which a biopsy is done. No mammographic evidence of malignancy, right breast.  RECOMMENDATION: Ultrasound guided left breast biopsy.  BI-RADS CATEGORY 4:  Suspicious abnormality - biopsy should be considered.  Original Report Authenticated By: Hiram Gash, M.D.   Mm Digital Diagnostic Unilat L  02/01/2012  *RADIOLOGY REPORT*  Clinical Data:  Left breast mass  DIGITAL DIAGNOSTIC LEFT MAMMOGRAM  Comparison:  Previous exams  Findings:  Films are performed following ultrasound guided biopsy of a left breast mass  images show a clip in association with a mass in the upper-outer quadrant of the left breast, posteriorly.  IMPRESSION: Adequate clip placement following ultrasound guided left breast biopsy.  Original Report Authenticated By: Hiram Gash, M.D.   Mm Radiologist Eval And Mgmt  02/19/2012  *RADIOLOGY REPORT*  ESTABLISHED PATIENT OFFICE VISIT - LEVEL II [phone])  Chief Complaint:  Status post bilateral breast ultrasound guided core needle biopsies on 02/15/2012.  The patient reports no pain at either biopsy site  today.  History:  Recently diagnosed left breast invasive ductal carcinoma. Four additional left breast masses and two right breast masses on a subsequent staging MR examination of the breasts.  One of the additional masses in the right breast breast and two of the additional masses in the left breast were found at targeted ultrasound.  The right breast mass seen with ultrasound and one of the two additional left breast masses seen with ultrasound were biopsied with ultrasound guidance on 02/15/2012.  Exam:  The patient has some bruising in the areolar and periareolar regions of the left breast.  She has no bruising in the right breast and there is no palpable hematoma in either breast.  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  Assessment and Plan:  The final pathological diagnoses for both breasts are concordant with the imaging findings.  The coil clip placed at the time of the left breast ultrasound guided core needle biopsy on 02/15/2012 is located 3.6 cm anterior to the ribbon shaped clip located within the originally diagnosed invasive ductal carcinoma.  The coil clip was previously shown to be located 2.4 cm anterior to the anterior margin of the known malignancy in the left breast.  The ribbon shaped clip in the right breast is within the more benign-appearing of the two masses seen in the right breast on the recent MR.  The 7 mm mass with mildly irregular margins in the central right breast was sonographically occult.  This remains suspicious for the possibility of malignancy.  This would require MR guided core needle biopsy for diagnosis.  This was discussed with the patient and Dr. Dwain Sarna.   Original Report Authenticated By: Darrol Angel, M.D.    Mm Radiologist Eval And Mgmt  02/02/2012  *RADIOLOGY REPORT*  ESTABLISHED PATIENT OFFICE VISIT - LEVEL II [phone])  Chief Complaint:  Left breast mass  History:  The patient had an  abnormal mammogram showing a left breast mass.  Normal ultrasound guided biopsy was done.  Exam:  The biopsy site is clean and dry without significant erythema or bruising.  Pathology: Biopsy results reveal invasive ductal carcinoma, concordant with imaging findings.  Assessment and Plan:  A surgical consultation is made with Dr. Dwain Sarna August 12 at 2:00 p.m.  A breast MRI is scheduled for August 11 at 12:30 p.m.  Original Report Authenticated By: Hiram Gash, M.D.    Assessment and Plan: Pleasant woman who presents with a locally advanced breast cancer involving the left breast. She has multiple lesions in both breasts. At this point I am unclear as to whether she is a candidate for breast conservation. She is due for more biopsies tomorrow. I will discuss my findings with Dr. Dwain Sarna. If in fact these other lesions aren't been ongoing she certainly is a candidate for down staging possibly with antiestrogen therapy. She's had genetic testing performed were awaiting results of that as well. This will also play well in ultimate decision making. I plan to see her again in followup after final biopsies of been obtained and I had a  discussion with Dr. Dwain Sarna.  Mervin Hack M.D. FRCP C.*

## 2012-02-28 ENCOUNTER — Ambulatory Visit
Admission: RE | Admit: 2012-02-28 | Discharge: 2012-02-28 | Disposition: A | Discharge status: Home / Self Care | Payer: PRIVATE HEALTH INSURANCE | Source: Ambulatory Visit | Attending: General Surgery | Admitting: General Surgery

## 2012-02-28 DIAGNOSIS — C50919 Malignant neoplasm of unspecified site of unspecified female breast: Secondary | ICD-10-CM

## 2012-02-28 MED ORDER — GADOBENATE DIMEGLUMINE 529 MG/ML IV SOLN
14.0000 mL | Freq: Once | INTRAVENOUS | Status: AC | PRN
  Administered 2012-02-28: 14 mL via INTRAVENOUS

## 2012-02-29 ENCOUNTER — Encounter: Payer: Self-pay | Admitting: *Deleted

## 2012-02-29 ENCOUNTER — Telehealth (INDEPENDENT_AMBULATORY_CARE_PROVIDER_SITE_OTHER): Payer: Self-pay

## 2012-02-29 ENCOUNTER — Telehealth: Payer: Self-pay | Admitting: Genetic Counselor

## 2012-02-29 NOTE — Telephone Encounter (Signed)
Left good news message about test results and phone number to call me back.

## 2012-02-29 NOTE — Progress Notes (Signed)
Mailed after appt letter to pt. 

## 2012-02-29 NOTE — Telephone Encounter (Signed)
Called pt to give her the appt date and time for Dr Dwain Sarna on 8/30.

## 2012-03-01 ENCOUNTER — Encounter (INDEPENDENT_AMBULATORY_CARE_PROVIDER_SITE_OTHER): Payer: Self-pay | Admitting: General Surgery

## 2012-03-01 ENCOUNTER — Ambulatory Visit (INDEPENDENT_AMBULATORY_CARE_PROVIDER_SITE_OTHER): Payer: PRIVATE HEALTH INSURANCE | Admitting: General Surgery

## 2012-03-01 ENCOUNTER — Encounter: Payer: Self-pay | Admitting: Genetic Counselor

## 2012-03-01 VITALS — BP 152/100 | HR 104 | Temp 97.4°F | Resp 16 | Ht 60.0 in | Wt 155.4 lb

## 2012-03-01 DIAGNOSIS — C50419 Malignant neoplasm of upper-outer quadrant of unspecified female breast: Secondary | ICD-10-CM

## 2012-03-01 NOTE — Progress Notes (Signed)
Subjective:     Patient ID: Alicia Pena, female   DOB: Feb 20, 1971, 41 y.o.   MRN: [id number]  HPI This is a 41 year old female I've seen previously for a left upper-outer quadrant breast mass associated with dimpling. 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 She also undergone an MRI which showed multiple other masses. I sent her back to get a number of these biopsied at this point. She's had 2 other biopsies on the right side one of which is by ultrasound and the other one by MRI. 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 Neither of these is clinically apparent. She has had now 2 other biopsies on the left breast both of which are invasive ductal carcinoma. The second one I sent her back to get an MRI guided biopsy of the nodule that was farthest from the incident cancer. This is invasive ductal carcinoma and this appears to be about 7 cm anterior and medial to the central area of the dominant mass. She comes back in today to discuss her options. I've also received the message that her BRCA testing is negative. She has also been seen by medical oncology as well.  Review of Systems DIGITAL DIAGNOSTIC BILATERAL MAMMOGRAM  Comparison: Previous exams  Findings: Films are performed following bilateral MRI guided  biopsies.  In the left breast, a cylindrical biopsy clip was placed at the  time of today's MRI-guided biopsy. It is in the expected location  of the biopsy, in the anterior third of the upper central left  breast. The biopsy clip placed today is 7 cm anterior and medial  (measured on the CC view) to the central aspect of the dominant  mass which was proven to be malignant on recent ultrasound guided  core needle biopsy.  In the right breast 8 pounds of shaped biopsy clip is present in  the upper  and slightly outer right breast near the middle to  posterior thirds. This is in the general expected location of the  biopsied mass  IMPRESSION:  Satisfactory position of bilateral biopsy clips as described above.      Objective:   Physical Exam deferred    Assessment:     Multicentric left breast cancer    Plan:     I sat down with her today and had a long discussion about all of these findings. Her genetic testing is negative and her right side does not need any treatment. We discussed for the left side I still recommended a sentinel lymph node. I went over on the left side now that she has 3 separate areas there biopsy-proven invasive ductal carcinoma that are separated by about 7 cm. This gives her a multicentric left breast cancer I recommended that the best treatment for cancer and eventually for cosmesis would be a mastectomy followed by the her immediate or delayed reconstruction at that point. My feeling is that probably a delayed reconstruction might be reasonable given the possibility of radiation therapy for her. We discussed this and she was very tearful today. She does not want to undergo a mastectomy. We discussed several times the indications for this. She is to take the weekend and think about this and I'll see her back on next Tuesday. I offered a second opinion as well she will let me know she wants to do next week.

## 2012-03-05 ENCOUNTER — Ambulatory Visit (INDEPENDENT_AMBULATORY_CARE_PROVIDER_SITE_OTHER): Payer: PRIVATE HEALTH INSURANCE | Admitting: General Surgery

## 2012-03-05 ENCOUNTER — Encounter (INDEPENDENT_AMBULATORY_CARE_PROVIDER_SITE_OTHER): Payer: Self-pay | Admitting: General Surgery

## 2012-03-05 VITALS — BP 120/80 | HR 72 | Resp 16 | Ht 60.0 in | Wt 155.0 lb

## 2012-03-05 DIAGNOSIS — C50419 Malignant neoplasm of upper-outer quadrant of unspecified female breast: Secondary | ICD-10-CM

## 2012-03-06 ENCOUNTER — Encounter (INDEPENDENT_AMBULATORY_CARE_PROVIDER_SITE_OTHER): Payer: Self-pay | Admitting: General Surgery

## 2012-03-06 NOTE — Progress Notes (Signed)
Subjective:     Patient ID: Alicia Pena, female   DOB: 04/30/1971, 40 y.o.   MRN: [id number]  HPI This is a 40-year-old female I've seen previously for a left upper-outer quadrant breast mass associated with dimpling. 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 She also undergone an MRI which showed multiple other masses. I sent her back to get a number of these biopsied at this point. She's had 2 other biopsies on the right side one of which is by ultrasound and the other one by MRI. 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 Neither of these is clinically apparent. She has had now 2 other biopsies on the left breast both of which are invasive ductal carcinoma. The second one I sent her back to get an MRI guided biopsy of the nodule that was farthest from the incident cancer. This is invasive ductal carcinoma and this appears to be about 7 cm anterior and medial to the central area of the dominant mass. . I've also received the message that her BRCA testing is negative. She has also been seen by medical oncology as well. I saw her Friday and we discussed the multicentric breast cancer and I recommended mastectomy with snbx.  She wanted to consider this and return to discuss further.   Review of Systems     Objective:   Physical Exam deferred    Assessment:     Multicentric left breast cancer    Plan:     Left mastectomy, left ax snbx  We discussed indications for mastectomy and why I don't think lumpectomy is option.  All other treatments including chemotherapy and xrt will be made on this pathology.  I think because of the nature of her tumor and possibility of radiation postop we should plan on delayed reconstruction.  We discussed plastics referral and she is ok waiting for this.  We discussed  risks of surgery and hospital course. We discussed the risks of operation including bleeding, infection, possible reoperation. She understands her further therapy will be based on what her stages at the time of her operation.        

## 2012-03-15 NOTE — Pre-Procedure Instructions (Signed)
20 Alicia Pena  03/15/2012   Your procedure is scheduled on:  Thursday March 21, 2012  Report to [facility] Short Stay Center at 8:00 AM.  Call this number if you have problems the morning of surgery: [phone]   Remember:   Do not eat food or drink:After Midnight.      Take these medicines the morning of surgery with A SIP OF WATER: lorazepam   Do not wear jewelry, make-up or nail polish.  Do not wear lotions, powders, or perfumes.   Do not shave 48 hours prior to surgery. Men may shave face and neck.  Do not bring valuables to the hospital.  Contacts, dentures or bridgework may not be worn into surgery.  Leave suitcase in the car. After surgery it may be brought to your room.  For patients admitted to the hospital, checkout time is 11:00 AM the day of discharge.   Patients discharged the day of surgery will not be allowed to drive home.  Name and phone number of your driver: familly / friend  Special Instructions: CHG Shower Use Special Wash: 1/2 bottle night before surgery and 1/2 bottle morning of surgery.   Please read over the following fact sheets that you were given: Pain Booklet, Coughing and Deep Breathing, MRSA Information and Surgical Site Infection Prevention

## 2012-03-18 ENCOUNTER — Encounter (HOSPITAL_COMMUNITY): Payer: Self-pay

## 2012-03-18 ENCOUNTER — Encounter (HOSPITAL_COMMUNITY)
Admission: RE | Admit: 2012-03-18 | Discharge: 2012-03-18 | Disposition: A | Discharge status: Home / Self Care | Payer: PRIVATE HEALTH INSURANCE | Source: Ambulatory Visit | Attending: General Surgery | Admitting: General Surgery

## 2012-03-18 HISTORY — DX: Cerebral palsy, unspecified: G80.9

## 2012-03-18 HISTORY — DX: Anxiety disorder, unspecified: F41.9

## 2012-03-18 LAB — CBC WITH DIFFERENTIAL/PLATELET
Eosinophils Absolute: 0.2 10*3/uL (ref 0.0–0.7)
HCT: 39.3 % (ref 36.0–46.0)
Hemoglobin: 12.4 g/dL (ref 12.0–15.0)
Lymphs Abs: 1.9 10*3/uL (ref 0.7–4.0)
MCH: 23.8 pg — ABNORMAL LOW (ref 26.0–34.0)
Monocytes Absolute: 0.5 10*3/uL (ref 0.1–1.0)
Monocytes Relative: 6 % (ref 3–12)
Neutro Abs: 5.2 10*3/uL (ref 1.7–7.7)
Neutrophils Relative %: 67 % (ref 43–77)
RBC: 5.2 MIL/uL — ABNORMAL HIGH (ref 3.87–5.11)

## 2012-03-18 LAB — SURGICAL PCR SCREEN: Staphylococcus aureus: NEGATIVE

## 2012-03-18 MED ORDER — VANCOMYCIN HCL IN DEXTROSE 1-5 GM/200ML-% IV SOLN: 1000.0000 mg | INTRAVENOUS | Status: DC

## 2012-03-21 ENCOUNTER — Other Ambulatory Visit (HOSPITAL_COMMUNITY): Payer: PRIVATE HEALTH INSURANCE

## 2012-03-21 ENCOUNTER — Encounter (HOSPITAL_COMMUNITY): Payer: Self-pay | Admitting: *Deleted

## 2012-03-21 ENCOUNTER — Ambulatory Visit (HOSPITAL_COMMUNITY)
Admission: RE | Admit: 2012-03-21 | Discharge: 2012-03-21 | Disposition: A | Discharge status: Home / Self Care | Payer: PRIVATE HEALTH INSURANCE | Source: Ambulatory Visit | Attending: General Surgery | Admitting: General Surgery

## 2012-03-21 ENCOUNTER — Encounter (HOSPITAL_COMMUNITY): Payer: Self-pay | Admitting: General Practice

## 2012-03-21 ENCOUNTER — Ambulatory Visit (HOSPITAL_COMMUNITY): Payer: PRIVATE HEALTH INSURANCE | Admitting: Anesthesiology

## 2012-03-21 ENCOUNTER — Ambulatory Visit (HOSPITAL_COMMUNITY)
Admission: RE | Admit: 2012-03-21 | Discharge: 2012-03-22 | Disposition: A | Discharge status: Home / Self Care | Payer: PRIVATE HEALTH INSURANCE | Source: Ambulatory Visit | Attending: General Surgery | Admitting: General Surgery

## 2012-03-21 ENCOUNTER — Encounter (HOSPITAL_COMMUNITY): Payer: Self-pay | Admitting: Anesthesiology

## 2012-03-21 ENCOUNTER — Encounter (HOSPITAL_COMMUNITY)
Admission: RE | Disposition: A | Discharge status: Home / Self Care | Payer: Self-pay | Source: Ambulatory Visit | Attending: General Surgery

## 2012-03-21 DIAGNOSIS — F411 Generalized anxiety disorder: Secondary | ICD-10-CM | POA: Insufficient documentation

## 2012-03-21 DIAGNOSIS — C773 Secondary and unspecified malignant neoplasm of axilla and upper limb lymph nodes: Secondary | ICD-10-CM | POA: Insufficient documentation

## 2012-03-21 DIAGNOSIS — C50919 Malignant neoplasm of unspecified site of unspecified female breast: Secondary | ICD-10-CM

## 2012-03-21 DIAGNOSIS — C50419 Malignant neoplasm of upper-outer quadrant of unspecified female breast: Secondary | ICD-10-CM | POA: Insufficient documentation

## 2012-03-21 DIAGNOSIS — D0590 Unspecified type of carcinoma in situ of unspecified breast: Secondary | ICD-10-CM | POA: Insufficient documentation

## 2012-03-21 DIAGNOSIS — Z01810 Encounter for preprocedural cardiovascular examination: Secondary | ICD-10-CM | POA: Insufficient documentation

## 2012-03-21 DIAGNOSIS — Z01812 Encounter for preprocedural laboratory examination: Secondary | ICD-10-CM | POA: Insufficient documentation

## 2012-03-21 HISTORY — DX: Malignant neoplasm of unspecified site of unspecified female breast: C50.919

## 2012-03-21 HISTORY — PX: MASTECTOMY COMPLETE / SIMPLE W/ SENTINEL NODE BIOPSY: SUR846

## 2012-03-21 SURGERY — SIMPLE MASTECTOMY WITH AXILLARY SENTINEL NODE BIOPSY
Anesthesia: General | Site: Breast | Laterality: Left | Wound class: Clean

## 2012-03-21 MED ORDER — MIDAZOLAM HCL 2 MG/2ML IJ SOLN
INTRAMUSCULAR | Status: AC
  Filled 2012-03-21: qty 2

## 2012-03-21 MED ORDER — LACTATED RINGERS IV SOLN
INTRAVENOUS | Status: DC | PRN
  Administered 2012-03-21: 10:00:00 via INTRAVENOUS

## 2012-03-21 MED ORDER — MIDAZOLAM HCL 2 MG/2ML IJ SOLN
1.0000 mg | INTRAMUSCULAR | Status: DC | PRN
  Administered 2012-03-21: 1 mg via INTRAVENOUS

## 2012-03-21 MED ORDER — SODIUM CHLORIDE 0.9 % IJ SOLN
INTRAMUSCULAR | Status: DC | PRN
  Administered 2012-03-21: 11:00:00

## 2012-03-21 MED ORDER — FENTANYL CITRATE 0.05 MG/ML IJ SOLN
50.0000 ug | INTRAMUSCULAR | Status: DC | PRN
  Administered 2012-03-21: 50 ug via INTRAVENOUS

## 2012-03-21 MED ORDER — HYDROMORPHONE HCL PF 1 MG/ML IJ SOLN
0.2500 mg | INTRAMUSCULAR | Status: DC | PRN
  Administered 2012-03-21 (×2): 0.5 mg via INTRAVENOUS

## 2012-03-21 MED ORDER — 0.9 % SODIUM CHLORIDE (POUR BTL) OPTIME
TOPICAL | Status: DC | PRN
  Administered 2012-03-21: 1000 mL

## 2012-03-21 MED ORDER — ACETAMINOPHEN 650 MG RE SUPP: 650.0000 mg | Freq: Four times a day (QID) | RECTAL | Status: DC | PRN

## 2012-03-21 MED ORDER — HYDROMORPHONE HCL PF 1 MG/ML IJ SOLN
INTRAMUSCULAR | Status: AC
  Filled 2012-03-21: qty 1

## 2012-03-21 MED ORDER — FENTANYL CITRATE 0.05 MG/ML IJ SOLN
INTRAMUSCULAR | Status: AC
  Filled 2012-03-21: qty 2

## 2012-03-21 MED ORDER — MORPHINE SULFATE 2 MG/ML IJ SOLN
2.0000 mg | INTRAMUSCULAR | Status: DC | PRN
  Administered 2012-03-21: 2 mg via INTRAVENOUS
  Filled 2012-03-21: qty 1

## 2012-03-21 MED ORDER — LORAZEPAM 1 MG PO TABS: 1.0000 mg | ORAL_TABLET | Freq: Every evening | ORAL | Status: DC | PRN

## 2012-03-21 MED ORDER — ONDANSETRON HCL 4 MG/2ML IJ SOLN
INTRAMUSCULAR | Status: DC | PRN
  Administered 2012-03-21: 4 mg via INTRAVENOUS

## 2012-03-21 MED ORDER — ACETAMINOPHEN 325 MG PO TABS: 650.0000 mg | ORAL_TABLET | Freq: Four times a day (QID) | ORAL | Status: DC | PRN

## 2012-03-21 MED ORDER — PROPOFOL 10 MG/ML IV BOLUS
INTRAVENOUS | Status: DC | PRN
  Administered 2012-03-21: 200 mg via INTRAVENOUS

## 2012-03-21 MED ORDER — MIDAZOLAM HCL 5 MG/5ML IJ SOLN
INTRAMUSCULAR | Status: DC | PRN
  Administered 2012-03-21 (×2): 1 mg via INTRAVENOUS

## 2012-03-21 MED ORDER — VANCOMYCIN HCL 1000 MG IV SOLR
1000.0000 mg | INTRAVENOUS | Status: DC | PRN
  Administered 2012-03-21: 1000 mg via INTRAVENOUS

## 2012-03-21 MED ORDER — FENTANYL CITRATE 0.05 MG/ML IJ SOLN: 50.0000 ug | INTRAMUSCULAR | Status: DC | PRN

## 2012-03-21 MED ORDER — OXYCODONE HCL 5 MG PO TABS
5.0000 mg | ORAL_TABLET | ORAL | Status: DC | PRN
  Administered 2012-03-22 (×2): 5 mg via ORAL
  Filled 2012-03-21 (×2): qty 1

## 2012-03-21 MED ORDER — METHYLENE BLUE 1 % INJ SOLN
INTRAMUSCULAR | Status: AC
  Filled 2012-03-21: qty 10

## 2012-03-21 MED ORDER — TECHNETIUM TC 99M SULFUR COLLOID FILTERED
1.0000 | Freq: Once | INTRAVENOUS | Status: AC | PRN
  Administered 2012-03-21: 1 via INTRADERMAL

## 2012-03-21 MED ORDER — MIDAZOLAM HCL 2 MG/2ML IJ SOLN: 1.0000 mg | INTRAMUSCULAR | Status: DC | PRN

## 2012-03-21 MED ORDER — LIDOCAINE HCL (CARDIAC) 20 MG/ML IV SOLN
INTRAVENOUS | Status: DC | PRN
  Administered 2012-03-21: 70 mg via INTRAVENOUS

## 2012-03-21 MED ORDER — FENTANYL CITRATE 0.05 MG/ML IJ SOLN
INTRAMUSCULAR | Status: DC | PRN
  Administered 2012-03-21: 75 ug via INTRAVENOUS
  Administered 2012-03-21 (×5): 25 ug via INTRAVENOUS

## 2012-03-21 MED ORDER — SODIUM CHLORIDE 0.9 % IV SOLN
INTRAVENOUS | Status: DC
  Administered 2012-03-21 – 2012-03-22 (×2): via INTRAVENOUS

## 2012-03-21 MED ORDER — LACTATED RINGERS IV SOLN
INTRAVENOUS | Status: DC
  Administered 2012-03-21: 09:00:00 via INTRAVENOUS

## 2012-03-21 MED ORDER — PROMETHAZINE HCL 25 MG/ML IJ SOLN
12.5000 mg | Freq: Four times a day (QID) | INTRAMUSCULAR | Status: DC | PRN
  Administered 2012-03-21: 12.5 mg via INTRAVENOUS
  Filled 2012-03-21: qty 1

## 2012-03-21 MED ORDER — ONDANSETRON HCL 4 MG/2ML IJ SOLN
4.0000 mg | Freq: Four times a day (QID) | INTRAMUSCULAR | Status: DC | PRN
  Administered 2012-03-21: 4 mg via INTRAVENOUS
  Filled 2012-03-21: qty 2

## 2012-03-21 MED ORDER — ONDANSETRON HCL 4 MG/2ML IJ SOLN: 4.0000 mg | Freq: Four times a day (QID) | INTRAMUSCULAR | Status: DC | PRN

## 2012-03-21 MED ORDER — EPHEDRINE SULFATE 50 MG/ML IJ SOLN
INTRAMUSCULAR | Status: DC | PRN
  Administered 2012-03-21 (×3): 5 mg via INTRAVENOUS

## 2012-03-21 SURGICAL SUPPLY — 50 items
APPLIER CLIP 9.375 MED OPEN (MISCELLANEOUS) ×2
BINDER BREAST LRG (GAUZE/BANDAGES/DRESSINGS) ×2 IMPLANT
BINDER BREAST XLRG (GAUZE/BANDAGES/DRESSINGS) IMPLANT
CANISTER SUCTION 2500CC (MISCELLANEOUS) ×2 IMPLANT
CHLORAPREP W/TINT 26ML (MISCELLANEOUS) ×2 IMPLANT
CLIP APPLIE 9.375 MED OPEN (MISCELLANEOUS) ×1 IMPLANT
CLOTH BEACON ORANGE TIMEOUT ST (SAFETY) ×2 IMPLANT
CONT SPEC 4OZ CLIKSEAL STRL BL (MISCELLANEOUS) ×2 IMPLANT
COVER PROBE W GEL 5X96 (DRAPES) ×2 IMPLANT
COVER SURGICAL LIGHT HANDLE (MISCELLANEOUS) ×2 IMPLANT
DERMABOND ADHESIVE PROPEN (GAUZE/BANDAGES/DRESSINGS) ×3
DERMABOND ADVANCED (GAUZE/BANDAGES/DRESSINGS) ×1
DERMABOND ADVANCED .7 DNX12 (GAUZE/BANDAGES/DRESSINGS) ×1 IMPLANT
DERMABOND ADVANCED .7 DNX6 (GAUZE/BANDAGES/DRESSINGS) ×3 IMPLANT
DRAIN CHANNEL 19F RND (DRAIN) ×2 IMPLANT
DRAPE CHEST BREAST 15X10 FENES (DRAPES) ×2 IMPLANT
DRSG PAD ABDOMINAL 8X10 ST (GAUZE/BANDAGES/DRESSINGS) ×4 IMPLANT
ELECT BLADE 4.0 EZ CLEAN MEGAD (MISCELLANEOUS) ×2
ELECT CAUTERY BLADE 6.4 (BLADE) ×2 IMPLANT
ELECT REM PT RETURN 9FT ADLT (ELECTROSURGICAL) ×2
ELECTRODE BLDE 4.0 EZ CLN MEGD (MISCELLANEOUS) ×1 IMPLANT
ELECTRODE REM PT RTRN 9FT ADLT (ELECTROSURGICAL) ×1 IMPLANT
EVACUATOR SILICONE 100CC (DRAIN) ×2 IMPLANT
GLOVE BIO SURGEON STRL SZ7 (GLOVE) ×2 IMPLANT
GLOVE BIOGEL PI IND STRL 7.5 (GLOVE) ×1 IMPLANT
GLOVE BIOGEL PI INDICATOR 7.5 (GLOVE) ×1
GOWN STRL NON-REIN LRG LVL3 (GOWN DISPOSABLE) ×6 IMPLANT
KIT BASIN OR (CUSTOM PROCEDURE TRAY) ×2 IMPLANT
KIT ROOM TURNOVER OR (KITS) ×2 IMPLANT
NEEDLE 18GX1X1/2 (RX/OR ONLY) (NEEDLE) ×2 IMPLANT
NEEDLE HYPO 25GX1X1/2 BEV (NEEDLE) ×2 IMPLANT
NS IRRIG 1000ML POUR BTL (IV SOLUTION) ×2 IMPLANT
PACK GENERAL/GYN (CUSTOM PROCEDURE TRAY) ×2 IMPLANT
PAD ARMBOARD 7.5X6 YLW CONV (MISCELLANEOUS) ×2 IMPLANT
SPECIMEN JAR X LARGE (MISCELLANEOUS) ×2 IMPLANT
STAPLER VISISTAT 35W (STAPLE) ×2 IMPLANT
STRIP CLOSURE SKIN 1/2X4 (GAUZE/BANDAGES/DRESSINGS) ×2 IMPLANT
SUT ETHILON 2 0 FS 18 (SUTURE) ×2 IMPLANT
SUT ETHILON 3 0 FSL (SUTURE) ×2 IMPLANT
SUT MNCRL AB 4-0 PS2 18 (SUTURE) ×2 IMPLANT
SUT MON AB 5-0 PS2 18 (SUTURE) ×2 IMPLANT
SUT SILK 2 0 FS (SUTURE) ×2 IMPLANT
SUT VIC AB 2-0 SH 27 (SUTURE) ×1
SUT VIC AB 2-0 SH 27X BRD (SUTURE) ×1 IMPLANT
SUT VIC AB 3-0 SH 18 (SUTURE) ×2 IMPLANT
SUT VIC AB 3-0 SH 8-18 (SUTURE) ×4 IMPLANT
SYR CONTROL 10ML LL (SYRINGE) ×2 IMPLANT
TOWEL OR 17X24 6PK STRL BLUE (TOWEL DISPOSABLE) ×2 IMPLANT
TOWEL OR 17X26 10 PK STRL BLUE (TOWEL DISPOSABLE) ×2 IMPLANT
WATER STERILE IRR 1000ML POUR (IV SOLUTION) IMPLANT

## 2012-03-21 NOTE — Progress Notes (Signed)
Pt arrived to unit from PACU. Alert and oriented. Delayed responses. Vomiting upon transfer. Zofran given IV. Pain 4/10 to left breast surgical site. Dressing dry and intact with binder overtop. JP drain in place - serosanguinous drainage. Oriented to unit. Bed in low position. Instructed not to get out of bed without calling for help first. Call bell within reach.

## 2012-03-21 NOTE — Interval H&P Note (Signed)
History and Physical Interval Note:  03/21/2012 9:42 AM  Alicia Pena  has presented today for surgery, with the diagnosis of left breast cancer  The various methods of treatment have been discussed with the patient and family. After consideration of risks, benefits and other options for treatment, the patient has consented to  Procedure(s) (LRB) with comments: SIMPLE MASTECTOMY WITH AXILLARY SENTINEL NODE BIOPSY (Left) - left simple mastectomy with left axillary sentinel node biopsy as a surgical intervention .  The patient's history has been reviewed, patient examined, no change in status, stable for surgery.  I have reviewed the patient's chart and labs.  Questions were answered to the patient's satisfaction.     Megon Kalina

## 2012-03-21 NOTE — Anesthesia Procedure Notes (Signed)
Procedure Name: LMA Insertion Date/Time: 03/21/2012 11:01 AM Performed by: Gayla Medicus Pre-anesthesia Checklist: Patient identified, Emergency Drugs available, Patient being monitored, Suction available and Timeout performed Patient Re-evaluated:Patient Re-evaluated prior to inductionOxygen Delivery Method: Circle system utilized Preoxygenation: Pre-oxygenation with 100% oxygen Intubation Type: IV induction LMA: LMA inserted LMA Size: 4.0 Number of attempts: 1 Placement Confirmation: positive ETCO2 and breath sounds checked- equal and bilateral Tube secured with: Tape Dental Injury: Teeth and Oropharynx as per pre-operative assessment

## 2012-03-21 NOTE — Preoperative (Signed)
Beta Blockers   Reason not to administer Beta Blockers:Not Applicable 

## 2012-03-21 NOTE — Op Note (Signed)
Preoperative diagnosis: Multifocal left breast cancer Postoperative diagnosis: Same as above Procedure: #1 left simple mastectomy #2 left axillary sentinel lymph node biopsy Surgeon: Dr. Harden Mo Anesthesia: Gen. Estimated blood loss: Minimal Specimens: #1 left breast marked short stitch superior, long stitch lateral #2 left axillary sentinel lymph node with counts of between 345 and 1499 Complications: None Drains: 19 [place] Blake drain to mastectomy space Sponge and needle count correct x2 at operation Disposition to recovery in stable condition  Indications: This a 41 year old female who presented after noting a left breast mass and left nipple retraction. There is dimpling in her skin in the upper outer quadrant as well. This larger mass underwent a biopsy showed invasive ductal carcinoma. She underwent further evaluation which included an MRI that showed several other nodules. Another one of these was biopsied it did show invasive ductal carcinoma and this was about 5 cm away from the other lesion. Due to her exam and based on the pathology I did recommend a mastectomy on this side. I did not think that primary systemic therapy would change the fact that she needed a mastectomy. We discussed delayed reconstruction given the possibility of radiation. She did undergo genetic testing it was also negative.  Procedure: After informed consent was obtained the patient was taken to the operating room. She was administered technetium in a standard fashion.she was administered 2 g of intravenous cefazolin. Sequential compression devices were placed the legs. She was in place under general anesthesia without complication. Her left breast and excellent and prepped and draped in the standard sterile surgical fashion. A surgical timeout was performed.  I made an elliptical incision that encompassed the nipple and areola. I did this laterally to ensure I removed the mass and where the dimpling was as  well. Flaps were then created to the clavicle superiorly, sternum medially, inframammary crease inferiorly, and latissimus laterally. I then removed the breast tissue from the pectoralis muscle including the pectoralis fascia without difficulty. The flaps were quite thin upon completion. There was no remaining breast tissue. This was marked as above. I then used the neoprobe to identify one packet of what appeared to be 3 sentinel lymph nodes. These counts are listed as above. The background radioactivity was negligible. I then closed the x-ray fashion with 2-0 Vicryl. I then irrigated. Hemostasis was obtained. I inserted a 19 [place] Blake drain. I then closed the incision with 3-0 Vicryl, 5-0 Monocryl, and Dermabond, and Steri-Strips. Dressings were placed and a breast binder was placed over this. She tolerated as well as extubated and transferred to recovery stable.

## 2012-03-21 NOTE — Anesthesia Postprocedure Evaluation (Signed)
  Anesthesia Post-op Note  Patient: Alicia Pena  Procedure(s) Performed: Procedure(s) (LRB) with comments: SIMPLE MASTECTOMY WITH AXILLARY SENTINEL NODE BIOPSY (Left) - left simple mastectomy with left axillary sentinel node biopsy  Patient Location: PACU  Anesthesia Type: General  Level of Consciousness: awake, oriented, sedated and patient cooperative  Airway and Oxygen Therapy: Patient Spontanous Breathing and Patient connected to nasal cannula oxygen  Post-op Pain: mild  Post-op Assessment: Post-op Vital signs reviewed, Patient's Cardiovascular Status Stable, Respiratory Function Stable, Patent Airway, No signs of Nausea or vomiting and Pain level controlled  Post-op Vital Signs: stable  Complications: No apparent anesthesia complications

## 2012-03-21 NOTE — Anesthesia Preprocedure Evaluation (Signed)
Anesthesia Evaluation  Patient identified by MRN, date of birth, ID band Patient awake    Reviewed: Allergy & Precautions, H&P , NPO status , Patient's Chart, lab work & pertinent test results  Airway Mallampati: II  Neck ROM: full    Dental   Pulmonary          Cardiovascular     Neuro/Psych Anxiety Cerebral palsy    GI/Hepatic   Endo/Other    Renal/GU      Musculoskeletal   Abdominal   Peds  Hematology   Anesthesia Other Findings   Reproductive/Obstetrics                           Anesthesia Physical Anesthesia Plan  ASA: II  Anesthesia Plan: General   Post-op Pain Management:    Induction: Intravenous  Airway Management Planned: LMA  Additional Equipment:   Intra-op Plan:   Post-operative Plan:   Informed Consent: I have reviewed the patients History and Physical, chart, labs and discussed the procedure including the risks, benefits and alternatives for the proposed anesthesia with the patient or authorized representative who has indicated his/her understanding and acceptance.     Plan Discussed with: CRNA and Surgeon  Anesthesia Plan Comments:         Anesthesia Quick Evaluation

## 2012-03-21 NOTE — Progress Notes (Signed)
Pt has vomited 3-4 times since arriving to the floor. Zofran is not working. Pt's bowel sounds are hypoactive. No abdominal pain or tenderness. MD notified. Order obtained for Phenergan IV.

## 2012-03-21 NOTE — H&P (View-Only) (Signed)
Subjective:     Patient ID: Alicia Pena, female   DOB: 1970/07/17, 41 y.o.   MRN: [id number]  HPI This is a 41 year old female I've seen previously for a left upper-outer quadrant breast mass associated with dimpling. 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 She also undergone an MRI which showed multiple other masses. I sent her back to get a number of these biopsied at this point. She's had 2 other biopsies on the right side one of which is by ultrasound and the other one by MRI. 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 Neither of these is clinically apparent. She has had now 2 other biopsies on the left breast both of which are invasive ductal carcinoma. The second one I sent her back to get an MRI guided biopsy of the nodule that was farthest from the incident cancer. This is invasive ductal carcinoma and this appears to be about 7 cm anterior and medial to the central area of the dominant mass. Peggye Form also received the message that her BRCA testing is negative. She has also been seen by medical oncology as well. I saw her Friday and we discussed the multicentric breast cancer and I recommended mastectomy with snbx.  She wanted to consider this and return to discuss further.   Review of Systems     Objective:   Physical Exam deferred    Assessment:     Multicentric left breast cancer    Plan:     Left mastectomy, left ax snbx  We discussed indications for mastectomy and why I don't think lumpectomy is option.  All other treatments including chemotherapy and xrt will be made on this pathology.  I think because of the nature of her tumor and possibility of radiation postop we should plan on delayed reconstruction.  We discussed plastics referral and she is ok waiting for this.  We discussed  risks of surgery and hospital course. We discussed the risks of operation including bleeding, infection, possible reoperation. She understands her further therapy will be based on what her stages at the time of her operation.

## 2012-03-21 NOTE — Transfer of Care (Signed)
Immediate Anesthesia Transfer of Care Note  Patient: Alicia Pena  Procedure(s) Performed: Procedure(s) (LRB) with comments: SIMPLE MASTECTOMY WITH AXILLARY SENTINEL NODE BIOPSY (Left) - left simple mastectomy with left axillary sentinel node biopsy  Patient Location: PACU  Anesthesia Type: General  Level of Consciousness: awake and alert   Airway & Oxygen Therapy: Patient Spontanous Breathing and Patient connected to nasal cannula oxygen  Post-op Assessment: Report given to PACU RN, Post -op Vital signs reviewed and stable and Patient moving all extremities X 4  Post vital signs: Reviewed and stable  Complications: No apparent anesthesia complications

## 2012-03-22 ENCOUNTER — Telehealth (INDEPENDENT_AMBULATORY_CARE_PROVIDER_SITE_OTHER): Payer: Self-pay

## 2012-03-22 MED ORDER — OXYCODONE-ACETAMINOPHEN 5-325 MG PO TABS: 1.0000 | ORAL_TABLET | ORAL | Status: DC | PRN

## 2012-03-22 NOTE — Telephone Encounter (Signed)
LMOM for pt giving her the f/u appt for Monday at 4:10.

## 2012-03-22 NOTE — Progress Notes (Signed)
1 Day Post-Op  Subjective: No more n/v, slept well, pain controlled  Objective: Vital signs in last 24 hours: Temp:  [97.3 F (36.3 C)-98.1 F (36.7 C)] 97.8 F (36.6 C) (09/20 0602) Pulse Rate:  [68-140] 72  (09/20 0602) Resp:  [18-28] 18  (09/20 0602) BP: (93-155)/(52-110) 96/59 mmHg (09/20 0602) SpO2:  [95 %-100 %] 98 % (09/20 0602) Weight:  [163 lb 5.8 oz (74.1 kg)] 163 lb 5.8 oz (74.1 kg) (09/19 1419) Last BM Date: 03/20/12  Intake/Output from previous day: 09/19 0701 - 09/20 0700 In: 3096.3 [I.V.:3096.3] Out: 1485 [Urine:1300; Drains:185] Intake/Output this shift: Total I/O In: 1296.3 [I.V.:1296.3] Out: 645 [Urine:575; Drains:70]  General appearance: no distress Chest wall: no tenderness, left mastectomy flaps viable with no hematoma, drain with serosang fluid   Assessment/Plan: POD 1 s/p left sm/snbx  LOS: 1 day  If she can ambulate with pain controlled,tol diet with no further n/v then possibly dc later today.  Please call Dr. Andrey Campanile if able to be discharged.  [facility] 03/22/2012

## 2012-03-22 NOTE — Progress Notes (Signed)
Discharge instructions reviewed with pt and pt's family and prescription given.  Instructed pt on emptying and measuring JP drain.  Pt and pt's family verbalized understanding and had no questions.  Pt discharged in stable condition via  wheelchair with family.  Alicia Pena [place]

## 2012-03-25 ENCOUNTER — Encounter (INDEPENDENT_AMBULATORY_CARE_PROVIDER_SITE_OTHER): Payer: Self-pay | Admitting: General Surgery

## 2012-03-25 ENCOUNTER — Ambulatory Visit (INDEPENDENT_AMBULATORY_CARE_PROVIDER_SITE_OTHER): Payer: PRIVATE HEALTH INSURANCE | Admitting: General Surgery

## 2012-03-25 VITALS — BP 144/86 | HR 102 | Temp 98.8°F | Resp 24 | Ht 60.0 in | Wt 155.0 lb

## 2012-03-25 DIAGNOSIS — Z09 Encounter for follow-up examination after completed treatment for conditions other than malignant neoplasm: Secondary | ICD-10-CM

## 2012-03-26 ENCOUNTER — Encounter: Payer: Self-pay | Admitting: *Deleted

## 2012-03-26 ENCOUNTER — Telehealth: Payer: Self-pay | Admitting: *Deleted

## 2012-03-26 NOTE — Progress Notes (Signed)
Subjective:     Patient ID: Alicia Pena, female   DOB: [birth year], 41 y.o.   MRN: [id number]  HPI 29 yof who recently underwent left simple mastectomy and left axillary sentinel node biopsy.  She is doing well with over than 30 cc out of her drain still.  Her pathology has returned with multifocal invasive and in situ ductal carcinoma measuring 3.2, 2.3, 1.3 and 0.8 cm.  The margins are not involved and she does have focal lymphovascular invasion.  She has two nodes that are positive and one has extracapsular extension.  ER and PR are 100% positive for the three tumors tested.  Her2 neu is elevated in one but not two others and is being repeated.  She and I discussed her pathology today.  She was very tearful.  Review of Systems     Objective:   Physical Exam Healing left mastectomy incision without infection, drain with expected output    Assessment:     S/p left mastectomy/snbx    Plan:     I discussed her pathology. I told her that we likely need to return to or for ALND.  I also think we will need to place port.  She will need chemotherapy and herceptin at this point.  Will have her see oncology first though.  We also discussed she will likely need postmastectomy radiation therapy. I will see her back on Friday to review this again as I am not sure she understood it all today.

## 2012-03-26 NOTE — Telephone Encounter (Signed)
Scheduled pt to see Dr. Donnie Coffin on 03/27/12 at 11:00.  Confirmed appt date and time with pt.

## 2012-03-27 ENCOUNTER — Ambulatory Visit
Admission: RE | Admit: 2012-03-27 | Discharge: 2012-03-27 | Disposition: A | Discharge status: Home / Self Care | Payer: PRIVATE HEALTH INSURANCE | Source: Ambulatory Visit | Attending: Radiation Oncology | Admitting: Radiation Oncology

## 2012-03-27 ENCOUNTER — Telehealth: Payer: Self-pay | Admitting: *Deleted

## 2012-03-27 ENCOUNTER — Telehealth (INDEPENDENT_AMBULATORY_CARE_PROVIDER_SITE_OTHER): Payer: Self-pay | Admitting: General Surgery

## 2012-03-27 ENCOUNTER — Ambulatory Visit (HOSPITAL_BASED_OUTPATIENT_CLINIC_OR_DEPARTMENT_OTHER): Payer: PRIVATE HEALTH INSURANCE | Admitting: Oncology

## 2012-03-27 ENCOUNTER — Encounter: Payer: Self-pay | Admitting: Radiation Oncology

## 2012-03-27 VITALS — BP 143/95 | HR 72 | Temp 97.9°F | Resp 20 | Ht 60.0 in | Wt 156.9 lb

## 2012-03-27 VITALS — BP 149/95 | HR 80 | Temp 98.5°F | Resp 20 | Wt 157.9 lb

## 2012-03-27 DIAGNOSIS — C50419 Malignant neoplasm of upper-outer quadrant of unspecified female breast: Secondary | ICD-10-CM | POA: Insufficient documentation

## 2012-03-27 DIAGNOSIS — G809 Cerebral palsy, unspecified: Secondary | ICD-10-CM | POA: Insufficient documentation

## 2012-03-27 DIAGNOSIS — Z9010 Acquired absence of unspecified breast and nipple: Secondary | ICD-10-CM | POA: Insufficient documentation

## 2012-03-27 DIAGNOSIS — C50919 Malignant neoplasm of unspecified site of unspecified female breast: Secondary | ICD-10-CM

## 2012-03-27 DIAGNOSIS — C773 Secondary and unspecified malignant neoplasm of axilla and upper limb lymph nodes: Secondary | ICD-10-CM | POA: Insufficient documentation

## 2012-03-27 NOTE — Progress Notes (Signed)
Pt denies pain, states she still has drain in left breast draining approx 60 cc brown liquid daily. Pt sees surgeon again this Friday. Pt became teary when she saw self in mirror; door left open. Pt's aunt w/her.

## 2012-03-27 NOTE — Progress Notes (Signed)
Hematology and Oncology Follow Up Visit  Alicia Pena [id number] 12-11-1970 40 y.o. 03/27/2012 1:55 PM   DIAGNOSIS:   Encounter Diagnosis  Name Primary?  . Cancer of upper-outer quadrant of female breast Yes     PAST THERAPY: Mastectomy 03/21/2012 with sentinel lymph node evaluation.    Interim History:  Patient returns for followup. Chair mastectomy and sentinel lymph node evaluation. As noted in the patches multifocal disease largest tumor 3.3 cm. The larger tumor was also HER-2 positive with a ratio of 3.3. 2 sentinel lymph nodes identified the larger one had extracapsular extension but does have disease in both lymph nodes. She has recovered from surgery. She has a drain in place. Medications: I have reviewed the patient's current medications.  Allergies:  Allergies  Allergen Reactions  . Duricef (Cefadroxil) Hives and Nausea Only    Past Medical History, Surgical history, Social history, and Family History were reviewed and updated.  Review of Systems: Constitutional:  Negative for fever, chills, night sweats, anorexia, weight loss, pain. Cardiovascular: no chest pain or dyspnea on exertion Respiratory: negative Neurological: negative Dermatological: negative ENT: negative Skin Gastrointestinal: negative Genito-Urinary: negative Hematological and Lymphatic: negative Breast: negative Musculoskeletal: negative Remaining ROS negative.  Physical Exam:  Blood pressure 143/95, pulse 72, temperature 97.9 F (36.6 C), temperature source Oral, resp. rate 20, height 5' (1.524 m), weight 156 lb 14.4 oz (71.169 kg).  ECOG:  0  HEENT:  Sclerae anicteric, conjunctivae pink.  Oropharynx clear.  No mucositis or candidiasis.  Nodes:  No cervical, supraclavicular, or axillary lymphadenopathy palpated.  Breast Exam:  Right breast is benign.  No masses, discharge, skin change, or nipple inversion.  Left breast is status post mastectomy drains in place. Lungs:  Clear to  auscultation bilaterally.  No crackles, rhonchi, or wheezes.  Heart:  Regular rate and rhythm.  Abdomen:  Soft, nontender.  Positive bowel sounds.  No organomegaly or masses palpated.  Musculoskeletal:  No focal spinal tenderness to palpation.  Extremities:  Benign.  No peripheral edema or cyanosis.  Skin:  Benign.  Neuro:  Nonfocal.    Lab Results: Lab Results  Component Value Date   WBC 7.8 03/18/2012   HGB 12.4 03/18/2012   HCT 39.3 03/18/2012   MCV 75.6* 03/18/2012   PLT 309 03/18/2012     Chemistry      Component Value Date/Time   NA 137 02/27/2012 1611   K 3.6 02/27/2012 1611   CL 106 02/27/2012 1611   CO2 22 02/27/2012 1611   BUN 10.0 02/27/2012 1611   CREATININE 0.6 02/27/2012 1611      Component Value Date/Time   CALCIUM 8.9 02/27/2012 1611   ALKPHOS 93 02/27/2012 1611   AST 14 02/27/2012 1611   ALT 15 02/27/2012 1611   BILITOT 0.90 02/27/2012 1611       Radiological Studies:  Mr Biopsy/wire Localization  02/29/2012  **ADDENDUM** CREATED: 02/29/2012 15:10:41  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  No further evaluation is necessary of the right breast.  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  These results are discussed with the patient by telephone at 1:15 p.m.  She states the right side is healing well without pain or bruising.  She does have some tenderness on the left side without bruising or discharge.  She has an appointment with Dr. Dwain Sarna tomorrow to discuss surgical treatment planning.  Addended by:  Hiram Gash, M.D. on 02/29/2012  15:10:41.  **END ADDENDUM** SIGNED BY: Hiram Gash, M.D.   02/28/2012  *RADIOLOGY REPORT*  Clinical Data:  Recent diagnosis of left breast cancer, diagnosed at two sites, following two separate ultrasound-guided core needle biopsies (the smaller of the two masses was originally detected by MRI).  Recent biopsy of a right  breast mass demonstrating fibroadenoma.  Additionally, biopsy is recommended of a 7 mm enhancing mass in the central right breast, and of an additional enhancing nodule in the left breast, to assess extent of disease.  MRI GUIDED VACUUM ASSISTED BIOPSY OF THE LEFT AND RIGHT BREASTS WITHOUT AND WITH CONTRAST  Comparison: Previous exams, including breast MRI of February 11, 2012.  Technique: Multiplanar, multisequence MR images of the left and right breasts were obtained prior to and following the intravenous administration of 14 ml of Mulithance.  I met with the patient, and we discussed the procedure of MRI guided biopsy, including risks, benefits, and alternatives. Specifically, we discussed the risks of infection, bleeding, tissue injury, clip migration, and inadequate sampling.  Informed, written consent was given.  Using sterile technique, 2% Lidocaine, MRI guidance, and a 9 gauge vacuum assisted device, biopsy was performed of an oval approximately 6-7 mm enhancing nodule in the posterior third of the upper central right breast using a lateral to medial approach. It is noted that the enhancing nodule in the right breast appears more posterior in position than it did on the original MRI of February 11, 2012, but given that this is the only enhancing mass in the right breast (aside from the previously diagnosed fibroadenoma in the anterior right breast), it was biopsied. It is possible that the apparent difference in position is related to the difference in compression by the grid and positioning of the right breast at time of biopsy, compared to prior breast MRI.  Using sterile technique, 2% lidocaine, MRI guidance, an 9-gauge vacuum-assisted device, biopsy was performed of a 5-6 mm enhancing nodule in the anterior left breast, superior to the nipple in the upper central left breast, and far anterior to the recently diagnosed dominant breast malignancy.  At the conclusion of the procedure a dumbbell shaped biopsy  clip was deployed into the biopsy cavity the right breast.  At the conclusion of the procedure, a cylindrical tissue marker clip was deployed into the biopsy cavity of the left breast.  IMPRESSION: MRI guided biopsy of both breasts No apparent complications.  THREE-DIMENSIONAL MR IMAGE RENDERING ON INDEPENDENT WORKSTATION:  Three-dimensional MR images were rendered by post-processing of the original MR data on an independent workstation.  The three- dimensional MR images were interpreted, and findings were reported in the accompanying complete MRI report for this study.   Original Report Authenticated By: Hiram Gash, M.D.    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  No further evaluation is necessary of the right breast.  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  These results are discussed with the patient by telephone at 1:15 p.m.  She states the right side is healing well without pain or bruising.  She does have some tenderness on the left side without bruising or discharge.  She has an appointment with Dr. Dwain Sarna tomorrow to discuss surgical treatment planning.  Addended by:  Hiram Gash, M.D. on 02/29/2012 15:10:41.  **END ADDENDUM** SIGNED BY: Hiram Gash, M.D.   02/28/2012  *  RADIOLOGY REPORT*  Clinical Data:  Recent diagnosis of left breast cancer, diagnosed at two sites, following two separate ultrasound-guided core needle biopsies (the smaller of the two masses was originally detected by MRI).  Recent biopsy of a right breast mass demonstrating fibroadenoma.  Additionally, biopsy is recommended of a 7 mm enhancing mass in the central right breast, and of an additional enhancing nodule in the left breast, to assess extent of disease.  MRI GUIDED VACUUM ASSISTED BIOPSY OF THE LEFT AND RIGHT  BREASTS WITHOUT AND WITH CONTRAST  Comparison: Previous exams, including breast MRI of February 11, 2012.  Technique: Multiplanar, multisequence MR images of the left and right breasts were obtained prior to and following the intravenous administration of 14 ml of Mulithance.  I met with the patient, and we discussed the procedure of MRI guided biopsy, including risks, benefits, and alternatives. Specifically, we discussed the risks of infection, bleeding, tissue injury, clip migration, and inadequate sampling.  Informed, written consent was given.  Using sterile technique, 2% Lidocaine, MRI guidance, and a 9 gauge vacuum assisted device, biopsy was performed of an oval approximately 6-7 mm enhancing nodule in the posterior third of the upper central right breast using a lateral to medial approach. It is noted that the enhancing nodule in the right breast appears more posterior in position than it did on the original MRI of February 11, 2012, but given that this is the only enhancing mass in the right breast (aside from the previously diagnosed fibroadenoma in the anterior right breast), it was biopsied. It is possible that the apparent difference in position is related to the difference in compression by the grid and positioning of the right breast at time of biopsy, compared to prior breast MRI.  Using sterile technique, 2% lidocaine, MRI guidance, an 9-gauge vacuum-assisted device, biopsy was performed of a 5-6 mm enhancing nodule in the anterior left breast, superior to the nipple in the upper central left breast, and far anterior to the recently diagnosed dominant breast malignancy.  At the conclusion of the procedure a dumbbell shaped biopsy clip was deployed into the biopsy cavity the right breast.  At the conclusion of the procedure, a cylindrical tissue marker clip was deployed into the biopsy cavity of the left breast.  IMPRESSION: MRI guided biopsy of both breasts No apparent complications.  THREE-DIMENSIONAL  MR IMAGE RENDERING ON INDEPENDENT WORKSTATION:  Three-dimensional MR images were rendered by post-processing of the original MR data on an independent workstation.  The three- dimensional MR images were interpreted, and findings were reported in the accompanying complete MRI report for this study.   Original Report Authenticated By: Hiram Gash, M.D.    Nm Sentinel Node Inj-no Rpt (breast)  03/21/2012  CLINICAL DATA: left axillary sentinel node biopsy   Sulfur colloid was injected intradermally by the nuclear medicine  technologist for breast cancer sentinel node localization.     Mm Digital Diagnostic Bilat  02/28/2012  *RADIOLOGY REPORT*  Clinical Data:  Recent diagnosis of left breast cancer, multifocal, at two sites.  Biopsy was performed of a third enhancing nodule in the left breast today, the most anterior of the nodules. Additionally, biopsy was performed of an approximately 7 mm enhancing nodule in the upper central right breast.  DIGITAL DIAGNOSTIC BILATERAL MAMMOGRAM  Comparison:  Previous exams  Findings:  Films are performed following bilateral MRI guided biopsies.  In the left breast, a cylindrical biopsy clip was placed at the time of today's MRI-guided biopsy.  It  is in the expected location of the biopsy, in the anterior third of the upper central left breast.  The biopsy clip placed today is 7 cm anterior and medial (measured on the CC view) to the central aspect of the dominant mass which was proven to be malignant on recent ultrasound guided core needle biopsy.  In the right breast 8 pounds of shaped biopsy clip is present in the upper and slightly outer right breast near the middle to posterior thirds.  This is in the general expected location of the biopsied mass  IMPRESSION: Satisfactory position of bilateral biopsy clips as described above.   Original Report Authenticated By: Britta Mccreedy, M.D.    Diagnosis 1. 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 - MARGINS NOT INVOLVED. - FOCAL LYMPHATIC VASCULAR INVOLVEMENT AND PERINEURAL INVOLVEMENT BY TUMOR. 2. Lymph node, sentinel, biopsy, left axillary #1 - METASTATIC CARCINOMA IN ONE LYMPH NODE WITH EXTRACAPSULAR EXTENSION. (1/1). 3. Lymph node, sentinel, biopsy, left axillary #2 - METASTATIC CARCINOMA IN ONE LYMPH NODE (1/  IMPRESSIONS AND PLAN: A 41 y.o. female with   History of HER-2 positive breast cancer with multifocal disease. She has 2 lymph nodes involved. I discussed this with the surgeon and we have all agreed that she should have lymph node dissection as well as port placement. We discussed Herceptin therapy in conjunction with chemotherapy. We discussed going ahead with a staging PET scan, 2-D echo and chemotherapy teaching class. She did have genetic switch was negative. I will plan to see her back in about 10 days to reevaluate her. She understands need to for therapy.  Spent more than half the time coordinating care, as well as discussion of BMI and its implications.      Alicia Pena 9/25/20131:55 PM Cell [phone]

## 2012-03-27 NOTE — Telephone Encounter (Signed)
Patient confirmed over the phone the new date and time pet scan and echo scan will follow up with md

## 2012-03-27 NOTE — Telephone Encounter (Signed)
Pt called to report the drainage from her wound is changed in color from bright red, to dull red and now to dark red.  Reassured her this is okay.  Advised her to continue to measure the output daily and report the totals at her office visit this week.

## 2012-03-27 NOTE — Progress Notes (Signed)
Please see the Nurse Progress Note in the MD Initial Consult Encounter for this patient. 

## 2012-03-28 NOTE — Progress Notes (Signed)
Radiation Oncology         [phone] ________________________________  Name: Alicia Pena MRN: [id number]  Date: 03/27/2012  DOB: Feb 20, 1971  WU:[id number], [name], MD  Emelia Loron, MD     REFERRING PHYSICIAN: Emelia Loron, MD   DIAGNOSIS: The encounter diagnosis was Cancer of upper-outer quadrant of female breast.   HISTORY OF PRESENT ILLNESS::Alicia Pena is a 41 y.o. female who is seen for an initial consultation visit. The patient is seen postoperatively after a recent mastectomy for her diagnosis of left-sided breast cancer. She indicates that she noticed some dimpling within the left breast but did not think too much of this. However she was interested in presenting for her first mammogram and this did show some suspicious findings. Further workup showed multiple areas of concern. There was a 2.8 spiculated mass within the upper outer quadrant of the left breast which was associated with a clip artifact from prior biopsy. This did demonstrate invasive ductal carcinoma from the needle core biopsy from the upper outer quadrant of the left breast visional areas were present of concern in the left breast including a 9 mm enhancing mass which was 2.2 cm anterior to the known area of malignancy. There was also a third mass measuring 8 mm which was 2.1 cm anterior to the known area of malignancy. A satellite spiculated mass was also present measuring 8 mm and a 4 mm rounded mass was present in the 12:00 solid area all her location. The patient did have additional biopsies and was diagnosed with multi-centric disease. There were also a couple of small areas of concern in the right breast that these returned negative for malignancy on biopsy.  The patient ultimately proceeded to undergo a simple mastectomy on the left with sentinel lymph node evaluation. This was completed on 03/21/2012. 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 4 distinct areas were  seen measuring 3.2 cm, 2.3 cm, 1.3 cm and 0.8 cm. The margins were negative. There was focal lymphatic vascular involvement and. Neural involvement by tumor. Both sentinel lymph nodes demonstrated carcinoma and extracapsular extension was seen. Receptor studies have indicated that all 3 tumors tested were estrogen receptor positive and all 3 tumors tested were progesterone receptor positive. One of the tumors was also HER-2/neu positive. The patient's pathologic stage was [id number].   The patient indicates that she is doing well postoperatively. She is still draining some fluid and she sees Dr. Dwain Sarna again this Friday. She is being scheduled for an axillary lymph node dissection in light of her final pathology. She has also seen Dr. Donnie Coffin in medical oncology and he is planning to proceed with chemotherapy including Herceptin given her HER-2/neu status. I been asked to see her today for consideration of possible postmastectomy radiotherapy as well.   PREVIOUS RADIATION THERAPY: No   PAST MEDICAL HISTORY:  has a past medical history of Anemia; Anxiety; Cerebral palsy; Breast cancer (03/21/2012); and Allergy.     PAST SURGICAL HISTORY: Past Surgical History  Procedure Date  . Leg surgery     LLE  . Eye surgery     as a child  . Mastectomy complete / simple w/ sentinel node biopsy 03/21/2012    left Dr.Matthew Dwain Sarna  . Breast biopsy 89/07/2011    left breast uoq bx=invasive ductal ca  . Breast biopsy bi lateral 02/15/12    left = invasive ductal, right breast= fibroadenoma,fibrocystic changes,no atypia,hyperlasia or malignancy identified  . Jp drain 03/21/12  french blake drain to mastectomy space,left side   . Abdominal hysterectomy 2006    w/o oophorectomy     FAMILY HISTORY: family history includes Cancer (age of onset:55) in her father; Diabetes in her maternal grandmother; Hyperlipidemia in her maternal grandmother; and Hypertension in her maternal grandmother.   SOCIAL  HISTORY:  reports that she has never smoked. She has never used smokeless tobacco. She reports that she does not drink alcohol or use illicit drugs.   ALLERGIES: Duricef   MEDICATIONS:  Current Outpatient Prescriptions  Medication Sig Dispense Refill  . acetaminophen (TYLENOL) 325 MG tablet Take 650 mg by mouth every 6 (six) hours as needed.      Marland Kitchen ibuprofen (ADVIL,MOTRIN) 200 MG tablet Take 200 mg by mouth every 6 (six) hours as needed.      . Multiple Vitamin (MULTIVITAMIN WITH MINERALS) TABS Take 1 tablet by mouth daily.      Marland Kitchen oxyCODONE-acetaminophen (ROXICET) 5-325 MG per tablet Take 1-2 tablets by mouth every 4 (four) hours as needed for pain.  30 tablet  0     REVIEW OF SYSTEMS:  A 15 point review of systems is documented in the electronic medical record. This was obtained by the nursing staff. However, I reviewed this with the patient to discuss relevant findings and make appropriate changes.  A comprehensive review of systems was negative.    PHYSICAL EXAM:  weight is 157 lb 14.4 oz (71.623 kg). Her oral temperature is 98.5 F (36.9 C). Her blood pressure is 149/95 and her pulse is 80. Her respiration is 20.   General: Well-developed, in no acute distress, sitting in a wheelchair. The patient has expressed some concern after the surgery in terms of its effect on her and she did not want to look at herself in the mirror which was in the room. HEENT: Normocephalic, atraumatic; oral cavity clear Neck: Supple without any lymphadenopathy Cardiovascular: Regular rate and rhythm Respiratory: Clear to auscultation bilaterally Breasts/ chest wall: Breast exam negative on the right for lesions or lymphadenopathy. The patient is still bandaged on the left with no palpable nodules along the chest wall. The patient is tender in the axilla with no palpable lymphadenopathy. The surgical incisions appear to be healing well at this point with no sign of infection. GI: Soft, nontender, normal bowel  sounds Extremities: No edema present Neuro: No focal deficits     LABORATORY DATA:  Lab Results  Component Value Date   WBC 7.8 03/18/2012   HGB 12.4 03/18/2012   HCT 39.3 03/18/2012   MCV 75.6* 03/18/2012   PLT 309 03/18/2012   Lab Results  Component Value Date   NA 137 02/27/2012   K 3.6 02/27/2012   CL 106 02/27/2012   CO2 22 02/27/2012   Lab Results  Component Value Date   ALT 15 02/27/2012   AST 14 02/27/2012   ALKPHOS 93 02/27/2012   BILITOT 0.90 02/27/2012      RADIOGRAPHY: Mr Biopsy/wire Localization  02/29/2012  **ADDENDUM** CREATED: 02/29/2012 15:10:41  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  No further evaluation is necessary of the right breast.  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  These results are discussed with the patient by telephone at 1:15 p.m.  She states the right side is healing well without pain or bruising.  She does have some tenderness on the left side without bruising or discharge.  She has an  appointment with Dr. Dwain Sarna tomorrow to discuss surgical treatment planning.  Addended by:  Hiram Gash, M.D. on 02/29/2012 15:10:41.  **END ADDENDUM** SIGNED BY: Hiram Gash, M.D.   02/28/2012  *RADIOLOGY REPORT*  Clinical Data:  Recent diagnosis of left breast cancer, diagnosed at two sites, following two separate ultrasound-guided core needle biopsies (the smaller of the two masses was originally detected by MRI).  Recent biopsy of a right breast mass demonstrating fibroadenoma.  Additionally, biopsy is recommended of a 7 mm enhancing mass in the central right breast, and of an additional enhancing nodule in the left breast, to assess extent of disease.  MRI GUIDED VACUUM ASSISTED BIOPSY OF THE LEFT AND RIGHT BREASTS WITHOUT AND WITH CONTRAST  Comparison: Previous exams, including breast MRI of February 11, 2012.  Technique: Multiplanar,  multisequence MR images of the left and right breasts were obtained prior to and following the intravenous administration of 14 ml of Mulithance.  I met with the patient, and we discussed the procedure of MRI guided biopsy, including risks, benefits, and alternatives. Specifically, we discussed the risks of infection, bleeding, tissue injury, clip migration, and inadequate sampling.  Informed, written consent was given.  Using sterile technique, 2% Lidocaine, MRI guidance, and a 9 gauge vacuum assisted device, biopsy was performed of an oval approximately 6-7 mm enhancing nodule in the posterior third of the upper central right breast using a lateral to medial approach. It is noted that the enhancing nodule in the right breast appears more posterior in position than it did on the original MRI of February 11, 2012, but given that this is the only enhancing mass in the right breast (aside from the previously diagnosed fibroadenoma in the anterior right breast), it was biopsied. It is possible that the apparent difference in position is related to the difference in compression by the grid and positioning of the right breast at time of biopsy, compared to prior breast MRI.  Using sterile technique, 2% lidocaine, MRI guidance, an 9-gauge vacuum-assisted device, biopsy was performed of a 5-6 mm enhancing nodule in the anterior left breast, superior to the nipple in the upper central left breast, and far anterior to the recently diagnosed dominant breast malignancy.  At the conclusion of the procedure a dumbbell shaped biopsy clip was deployed into the biopsy cavity the right breast.  At the conclusion of the procedure, a cylindrical tissue marker clip was deployed into the biopsy cavity of the left breast.  IMPRESSION: MRI guided biopsy of both breasts No apparent complications.  THREE-DIMENSIONAL MR IMAGE RENDERING ON INDEPENDENT WORKSTATION:  Three-dimensional MR images were rendered by post-processing of the original MR  data on an independent workstation.  The three- dimensional MR images were interpreted, and findings were reported in the accompanying complete MRI report for this study.   Original Report Authenticated By: Hiram Gash, M.D.    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  No further evaluation is necessary of the right breast.  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  These results are discussed with the patient by telephone at 1:15 p.m.  She states the right side is healing well without pain or bruising.  She does have some tenderness on the left side without bruising or discharge.  She has an appointment with Dr. Dwain Sarna tomorrow to discuss surgical treatment planning.  Addended by:  Hiram Gash, M.D. on 02/29/2012 15:10:41.  **END ADDENDUM** SIGNED BY: Hiram Gash, M.D.   02/28/2012  *RADIOLOGY REPORT*  Clinical Data:  Recent diagnosis of left breast cancer, diagnosed at two sites, following two separate ultrasound-guided core needle biopsies (the smaller of the two masses was originally detected by MRI).  Recent biopsy of a right breast mass demonstrating fibroadenoma.  Additionally, biopsy is recommended of a 7 mm enhancing mass in the central right breast, and of an additional enhancing nodule in the left breast, to assess extent of disease.  MRI GUIDED VACUUM ASSISTED BIOPSY OF THE LEFT AND RIGHT BREASTS WITHOUT AND WITH CONTRAST  Comparison: Previous exams, including breast MRI of February 11, 2012.  Technique: Multiplanar, multisequence MR images of the left and right breasts were obtained prior to and following the intravenous administration of 14 ml of Mulithance.  I met with the patient, and we discussed the procedure of MRI guided biopsy, including risks, benefits, and alternatives.  Specifically, we discussed the risks of infection, bleeding, tissue injury, clip migration, and inadequate sampling.  Informed, written consent was given.  Using sterile technique, 2% Lidocaine, MRI guidance, and a 9 gauge vacuum assisted device, biopsy was performed of an oval approximately 6-7 mm enhancing nodule in the posterior third of the upper central right breast using a lateral to medial approach. It is noted that the enhancing nodule in the right breast appears more posterior in position than it did on the original MRI of February 11, 2012, but given that this is the only enhancing mass in the right breast (aside from the previously diagnosed fibroadenoma in the anterior right breast), it was biopsied. It is possible that the apparent difference in position is related to the difference in compression by the grid and positioning of the right breast at time of biopsy, compared to prior breast MRI.  Using sterile technique, 2% lidocaine, MRI guidance, an 9-gauge vacuum-assisted device, biopsy was performed of a 5-6 mm enhancing nodule in the anterior left breast, superior to the nipple in the upper central left breast, and far anterior to the recently diagnosed dominant breast malignancy.  At the conclusion of the procedure a dumbbell shaped biopsy clip was deployed into the biopsy cavity the right breast.  At the conclusion of the procedure, a cylindrical tissue marker clip was deployed into the biopsy cavity of the left breast.  IMPRESSION: MRI guided biopsy of both breasts No apparent complications.  THREE-DIMENSIONAL MR IMAGE RENDERING ON INDEPENDENT WORKSTATION:  Three-dimensional MR images were rendered by post-processing of the original MR data on an independent workstation.  The three- dimensional MR images were interpreted, and findings were reported in the accompanying complete MRI report for this study.   Original Report Authenticated By: Hiram Gash, M.D.    Nm Sentinel Node Inj-no Rpt  (breast)  03/21/2012  CLINICAL DATA: left axillary sentinel node biopsy   Sulfur colloid was injected intradermally by the nuclear medicine  technologist for breast cancer sentinel node localization.     Mm Digital Diagnostic Bilat  02/28/2012  *RADIOLOGY REPORT*  Clinical Data:  Recent diagnosis of left breast cancer, multifocal, at two sites.  Biopsy was performed of a third enhancing nodule in the left breast today, the most anterior of the nodules. Additionally, biopsy was performed of an approximately 7 mm enhancing nodule in the upper central right breast.  DIGITAL DIAGNOSTIC BILATERAL MAMMOGRAM  Comparison:  Previous exams  Findings:  Films are performed following bilateral MRI guided biopsies.  In the left breast, a cylindrical biopsy clip was placed at the time of today's MRI-guided biopsy.  It is in the expected location of the biopsy, in the anterior third of the upper central left breast.  The biopsy clip placed today is 7 cm anterior and medial (measured on the CC view) to the central aspect of the dominant mass which was proven to be malignant on recent ultrasound guided core needle biopsy.  In the right breast 8 pounds of shaped biopsy clip is present in the upper and slightly outer right breast near the middle to posterior thirds.  This is in the general expected location of the biopsied mass  IMPRESSION: Satisfactory position of bilateral biopsy clips as described above.   Original Report Authenticated By: Britta Mccreedy, M.D.        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 2 out of 2 lymph nodes positive for carcinoma with extracapsular extension seen. Negative margins for the main specimen.   PLAN: The plan is for the patient to go to surgery for an axillary lymph node dissection. She will also be proceeding with postoperative systemic treatment through Dr. Caron Presume. Given her young age and the findings at  surgery, I agree with this overall plan I do recommend postmastectomy radiation after she completes the main portion of her chemotherapy.  I discussed what this entails in terms of overall treatment and the patient did have a number of questions which were answered. We discussed a possible 6-1/2 week course of treatment on a daily basis. The radiation with target the left chest wall and supraclavicular region. We discussed the possible side effects and risks of treatment, both acute toxicity and long-term risks and possible toxicity. All of her questions were answered.    the patient does wish to proceed with this overall treatment plan. I therefore look forward to seeing her back in clinic towards the end of her chemotherapy. The patient did express an understanding of the planning process as well and we discussed the timing of beginning treatment and what needs to transpire to begin a course of radiation.  I spent 60 minutes minutes face to face with the patient and more than 50% of that time was spent in counseling and/or coordination of care.    ________________________________   Radene Gunning, MD, PhD

## 2012-03-29 ENCOUNTER — Encounter (INDEPENDENT_AMBULATORY_CARE_PROVIDER_SITE_OTHER): Payer: Self-pay | Admitting: General Surgery

## 2012-03-29 ENCOUNTER — Ambulatory Visit (INDEPENDENT_AMBULATORY_CARE_PROVIDER_SITE_OTHER): Payer: PRIVATE HEALTH INSURANCE | Admitting: General Surgery

## 2012-03-29 ENCOUNTER — Telehealth: Payer: Self-pay | Admitting: *Deleted

## 2012-03-29 VITALS — BP 134/82 | HR 78 | Resp 16 | Ht 60.0 in | Wt 155.0 lb

## 2012-03-29 DIAGNOSIS — Z09 Encounter for follow-up examination after completed treatment for conditions other than malignant neoplasm: Secondary | ICD-10-CM

## 2012-03-29 DIAGNOSIS — C50419 Malignant neoplasm of upper-outer quadrant of unspecified female breast: Secondary | ICD-10-CM

## 2012-03-29 MED ORDER — ALPRAZOLAM 0.25 MG PO TABS: 0.2500 mg | ORAL_TABLET | Freq: Every evening | ORAL | Status: DC | PRN

## 2012-03-29 NOTE — Progress Notes (Signed)
Subjective:     Patient ID: Alicia Pena, female   DOB: 05/07/1971, 41 y.o.   MRN: [id number]  HPI 41 year old female who is status post a left mastectomy and sentinel node biopsy. Her nodes were positive. She has done well from the operation and returns today with more than 30 cc still coming out of her drain. She has also been seen by medical and radiation oncology since then. I discussed their recommendations with her today also.she also complains of some swelling around her left axilla as well as pain in her shoulder.  Review of Systems     Objective:   Physical Exam Well-healing incision without infection and draining with serous fluid present    Assessment:     Node positive left breast cancer status post mastectomy    Plan:     I reviewed today my recommendations for port placement and axillary node dissection. We specifically discussed the risks and benefits of both of those procedures today. I think for cancer it is important to do a node dissection given the fact that she has had a mastectomy as well as her age. She is not specifically meet the criteria for Z11. We specifically discussed the risk moving forward of an axillary dissection including lymphedema, shoulder pain, numbness around this area. She is left-handed is very concerned about this which I think is appropriate. We discussed the need for physical therapy and seen a lymphedema specialist postoperatively I think that is even more important in light of the fact a short he has some symptoms after the surgery. We will plan on performing this procedure next week.

## 2012-03-29 NOTE — Telephone Encounter (Signed)
PATIENT CALLED TO HAVE SCANS RESCHEDULED TO 04-09-2012 PATIENT CONFIRMED OVER THE PHONE THE NEW DATE AND TIMES

## 2012-04-01 NOTE — Addendum Note (Signed)
Encounter addended by: Delynn Flavin, RN on: 04/01/2012  7:26 PM<BR>     Documentation filed: Charges VN

## 2012-04-02 ENCOUNTER — Other Ambulatory Visit (HOSPITAL_BASED_OUTPATIENT_CLINIC_OR_DEPARTMENT_OTHER): Payer: PRIVATE HEALTH INSURANCE

## 2012-04-02 ENCOUNTER — Encounter (HOSPITAL_BASED_OUTPATIENT_CLINIC_OR_DEPARTMENT_OTHER): Payer: Self-pay | Admitting: *Deleted

## 2012-04-02 ENCOUNTER — Encounter (HOSPITAL_BASED_OUTPATIENT_CLINIC_OR_DEPARTMENT_OTHER)
Admission: RE | Admit: 2012-04-02 | Discharge: 2012-04-02 | Disposition: A | Discharge status: Home / Self Care | Payer: PRIVATE HEALTH INSURANCE | Source: Ambulatory Visit | Attending: General Surgery | Admitting: General Surgery

## 2012-04-02 ENCOUNTER — Telehealth (INDEPENDENT_AMBULATORY_CARE_PROVIDER_SITE_OTHER): Payer: Self-pay

## 2012-04-02 LAB — BASIC METABOLIC PANEL
BUN: 16 mg/dL (ref 6–23)
CO2: 25 mEq/L (ref 19–32)
Calcium: 9 mg/dL (ref 8.4–10.5)
Glucose, Bld: 86 mg/dL (ref 70–99)
Sodium: 138 mEq/L (ref 135–145)

## 2012-04-02 LAB — CBC WITH DIFFERENTIAL/PLATELET
Eosinophils Absolute: 0.4 10*3/uL (ref 0.0–0.7)
Eosinophils Relative: 4 % (ref 0–5)
Hemoglobin: 11.2 g/dL — ABNORMAL LOW (ref 12.0–15.0)
Lymphs Abs: 1.8 10*3/uL (ref 0.7–4.0)
MCH: 23.7 pg — ABNORMAL LOW (ref 26.0–34.0)
MCHC: 31.5 g/dL (ref 30.0–36.0)
MCV: 75.1 fL — ABNORMAL LOW (ref 78.0–100.0)
Monocytes Relative: 7 % (ref 3–12)
Platelets: 322 10*3/uL (ref 150–400)
RBC: 4.73 MIL/uL (ref 3.87–5.11)

## 2012-04-02 NOTE — Progress Notes (Signed)
To bring meds and overnight bag Pt had cp-weak lt side, does not use a cane or walker-will use wc for long distances

## 2012-04-02 NOTE — Telephone Encounter (Signed)
Pt returned my call and I explained to her that blood was most likely from a hematoma.  She then told me she was having a lot of discomfort with the drain.  I recommended she take Ibuprofen every 6 hours if needed, and to rest as much as possible.  She agreed.

## 2012-04-02 NOTE — Telephone Encounter (Signed)
Pt called today concerned about an increase in drainage which she stated was mostly bloody.  I spoke with Dr. Dwain Sarna who said it was most likely old blood from her hematoma.  I left a message on her voicemail to call our office to discuss.

## 2012-04-04 ENCOUNTER — Ambulatory Visit (HOSPITAL_COMMUNITY): Payer: PRIVATE HEALTH INSURANCE

## 2012-04-04 ENCOUNTER — Encounter (HOSPITAL_BASED_OUTPATIENT_CLINIC_OR_DEPARTMENT_OTHER): Payer: Self-pay | Admitting: Anesthesiology

## 2012-04-04 ENCOUNTER — Ambulatory Visit (HOSPITAL_BASED_OUTPATIENT_CLINIC_OR_DEPARTMENT_OTHER): Payer: PRIVATE HEALTH INSURANCE | Admitting: Anesthesiology

## 2012-04-04 ENCOUNTER — Encounter (HOSPITAL_BASED_OUTPATIENT_CLINIC_OR_DEPARTMENT_OTHER)
Admission: RE | Disposition: A | Discharge status: Home / Self Care | Payer: Self-pay | Source: Ambulatory Visit | Attending: General Surgery

## 2012-04-04 ENCOUNTER — Encounter (HOSPITAL_BASED_OUTPATIENT_CLINIC_OR_DEPARTMENT_OTHER): Payer: Self-pay | Admitting: *Deleted

## 2012-04-04 ENCOUNTER — Ambulatory Visit (HOSPITAL_BASED_OUTPATIENT_CLINIC_OR_DEPARTMENT_OTHER)
Admission: RE | Admit: 2012-04-04 | Discharge: 2012-04-05 | Disposition: A | Discharge status: Home / Self Care | Payer: PRIVATE HEALTH INSURANCE | Source: Ambulatory Visit | Attending: General Surgery | Admitting: General Surgery

## 2012-04-04 DIAGNOSIS — C773 Secondary and unspecified malignant neoplasm of axilla and upper limb lymph nodes: Secondary | ICD-10-CM | POA: Insufficient documentation

## 2012-04-04 DIAGNOSIS — Z9010 Acquired absence of unspecified breast and nipple: Secondary | ICD-10-CM | POA: Insufficient documentation

## 2012-04-04 DIAGNOSIS — C50919 Malignant neoplasm of unspecified site of unspecified female breast: Secondary | ICD-10-CM | POA: Insufficient documentation

## 2012-04-04 DIAGNOSIS — C50419 Malignant neoplasm of upper-outer quadrant of unspecified female breast: Secondary | ICD-10-CM

## 2012-04-04 DIAGNOSIS — Z01812 Encounter for preprocedural laboratory examination: Secondary | ICD-10-CM | POA: Insufficient documentation

## 2012-04-04 HISTORY — DX: Other complications of anesthesia, initial encounter: T88.59XA

## 2012-04-04 HISTORY — PX: AXILLARY LYMPH NODE DISSECTION: SHX5229

## 2012-04-04 HISTORY — PX: PORTACATH PLACEMENT: SHX2246

## 2012-04-04 HISTORY — DX: Adverse effect of unspecified anesthetic, initial encounter: T41.45XA

## 2012-04-04 SURGERY — LYMPHADENECTOMY, AXILLARY
Anesthesia: General | Site: Chest | Laterality: Right | Wound class: Clean

## 2012-04-04 MED ORDER — SODIUM CHLORIDE 0.9 % IV SOLN
INTRAVENOUS | Status: DC
  Administered 2012-04-04: 75 mL/h via INTRAVENOUS

## 2012-04-04 MED ORDER — MIDAZOLAM HCL 2 MG/2ML IJ SOLN
1.0000 mg | INTRAMUSCULAR | Status: DC | PRN
  Administered 2012-04-04: 1 mg via INTRAVENOUS

## 2012-04-04 MED ORDER — LACTATED RINGERS IV SOLN
INTRAVENOUS | Status: DC
  Administered 2012-04-04: 07:00:00 via INTRAVENOUS

## 2012-04-04 MED ORDER — DEXAMETHASONE SODIUM PHOSPHATE 4 MG/ML IJ SOLN
INTRAMUSCULAR | Status: DC | PRN
  Administered 2012-04-04: 10 mg via INTRAVENOUS

## 2012-04-04 MED ORDER — ALPRAZOLAM 0.25 MG PO TABS: 0.2500 mg | ORAL_TABLET | Freq: Every evening | ORAL | Status: DC | PRN

## 2012-04-04 MED ORDER — 0.9 % SODIUM CHLORIDE (POUR BTL) OPTIME
TOPICAL | Status: DC | PRN
  Administered 2012-04-04: 250 mL

## 2012-04-04 MED ORDER — LIDOCAINE HCL (CARDIAC) 20 MG/ML IV SOLN
INTRAVENOUS | Status: DC | PRN
  Administered 2012-04-04: 60 mg via INTRAVENOUS

## 2012-04-04 MED ORDER — ACETAMINOPHEN 325 MG PO TABS: 650.0000 mg | ORAL_TABLET | Freq: Four times a day (QID) | ORAL | Status: DC | PRN

## 2012-04-04 MED ORDER — HEPARIN (PORCINE) IN NACL 2-0.9 UNIT/ML-% IJ SOLN
INTRAMUSCULAR | Status: DC | PRN
  Administered 2012-04-04: 1 via INTRAVENOUS

## 2012-04-04 MED ORDER — ACETAMINOPHEN 650 MG RE SUPP: 650.0000 mg | Freq: Four times a day (QID) | RECTAL | Status: DC | PRN

## 2012-04-04 MED ORDER — ONDANSETRON HCL 4 MG/2ML IJ SOLN
INTRAMUSCULAR | Status: DC | PRN
  Administered 2012-04-04: 4 mg via INTRAVENOUS

## 2012-04-04 MED ORDER — PROPOFOL 10 MG/ML IV BOLUS
INTRAVENOUS | Status: DC | PRN
  Administered 2012-04-04: 200 mg via INTRAVENOUS

## 2012-04-04 MED ORDER — HEPARIN SOD (PORK) LOCK FLUSH 100 UNIT/ML IV SOLN
INTRAVENOUS | Status: DC | PRN
  Administered 2012-04-04: 500 [IU] via INTRAVENOUS

## 2012-04-04 MED ORDER — MIDAZOLAM HCL 5 MG/5ML IJ SOLN
INTRAMUSCULAR | Status: DC | PRN
  Administered 2012-04-04: 2 mg via INTRAVENOUS

## 2012-04-04 MED ORDER — OXYCODONE HCL 5 MG PO TABS: 5.0000 mg | ORAL_TABLET | Freq: Once | ORAL | Status: AC | PRN

## 2012-04-04 MED ORDER — BUPIVACAINE HCL (PF) 0.25 % IJ SOLN
INTRAMUSCULAR | Status: DC | PRN
  Administered 2012-04-04: 10 mL

## 2012-04-04 MED ORDER — ACETAMINOPHEN 10 MG/ML IV SOLN
1000.0000 mg | Freq: Once | INTRAVENOUS | Status: AC
  Administered 2012-04-04: 1000 mg via INTRAVENOUS

## 2012-04-04 MED ORDER — PROMETHAZINE HCL 25 MG/ML IJ SOLN
12.5000 mg | Freq: Four times a day (QID) | INTRAMUSCULAR | Status: DC | PRN
  Administered 2012-04-05: 12.5 mg via INTRAVENOUS

## 2012-04-04 MED ORDER — OXYCODONE HCL 5 MG/5ML PO SOLN: 5.0000 mg | Freq: Once | ORAL | Status: AC | PRN

## 2012-04-04 MED ORDER — FENTANYL CITRATE 0.05 MG/ML IJ SOLN
INTRAMUSCULAR | Status: DC | PRN
  Administered 2012-04-04 (×3): 50 ug via INTRAVENOUS
  Administered 2012-04-04 (×2): 25 ug via INTRAVENOUS

## 2012-04-04 MED ORDER — MIDAZOLAM HCL 2 MG/2ML IJ SOLN: 0.5000 mg | INTRAMUSCULAR | Status: DC | PRN

## 2012-04-04 MED ORDER — HYDROMORPHONE HCL PF 1 MG/ML IJ SOLN
0.2500 mg | INTRAMUSCULAR | Status: DC | PRN
  Administered 2012-04-04: 0.5 mg via INTRAVENOUS
  Administered 2012-04-04: 0.25 mg via INTRAVENOUS
  Administered 2012-04-04: 0.5 mg via INTRAVENOUS

## 2012-04-04 MED ORDER — MORPHINE SULFATE 2 MG/ML IJ SOLN: 2.0000 mg | INTRAMUSCULAR | Status: DC | PRN

## 2012-04-04 MED ORDER — OXYCODONE-ACETAMINOPHEN 5-325 MG PO TABS
1.0000 | ORAL_TABLET | ORAL | Status: DC | PRN
  Administered 2012-04-04 – 2012-04-05 (×4): 2 via ORAL

## 2012-04-04 MED ORDER — ONDANSETRON HCL 4 MG/2ML IJ SOLN
4.0000 mg | Freq: Four times a day (QID) | INTRAMUSCULAR | Status: DC | PRN
  Administered 2012-04-04: 4 mg via INTRAVENOUS

## 2012-04-04 MED ORDER — CEFAZOLIN SODIUM-DEXTROSE 2-3 GM-% IV SOLR
2.0000 g | INTRAVENOUS | Status: AC
  Administered 2012-04-04: 2 g via INTRAVENOUS

## 2012-04-04 MED ORDER — FENTANYL CITRATE 0.05 MG/ML IJ SOLN: 50.0000 ug | INTRAMUSCULAR | Status: DC | PRN

## 2012-04-04 SURGICAL SUPPLY — 71 items
APPLIER CLIP 9.375 MED OPEN (MISCELLANEOUS) ×6
BAG DECANTER FOR FLEXI CONT (MISCELLANEOUS) ×6 IMPLANT
BENZOIN TINCTURE PRP APPL 2/3 (GAUZE/BANDAGES/DRESSINGS) ×3 IMPLANT
BINDER BREAST LRG (GAUZE/BANDAGES/DRESSINGS) ×3 IMPLANT
BLADE SURG 11 STRL SS (BLADE) ×3 IMPLANT
BLADE SURG 15 STRL LF DISP TIS (BLADE) ×4 IMPLANT
BLADE SURG 15 STRL SS (BLADE) ×2
CANISTER SUCTION 1200CC (MISCELLANEOUS) ×6 IMPLANT
CHLORAPREP W/TINT 26ML (MISCELLANEOUS) ×9 IMPLANT
CLIP APPLIE 9.375 MED OPEN (MISCELLANEOUS) ×4 IMPLANT
CLOTH BEACON ORANGE TIMEOUT ST (SAFETY) ×3 IMPLANT
COVER MAYO STAND STRL (DRAPES) ×6 IMPLANT
COVER TABLE BACK 60X90 (DRAPES) ×6 IMPLANT
DECANTER SPIKE VIAL GLASS SM (MISCELLANEOUS) IMPLANT
DERMABOND ADVANCED (GAUZE/BANDAGES/DRESSINGS) ×2
DERMABOND ADVANCED .7 DNX12 (GAUZE/BANDAGES/DRESSINGS) ×4 IMPLANT
DRAIN CHANNEL 19F RND (DRAIN) ×3 IMPLANT
DRAPE C-ARM 42X72 X-RAY (DRAPES) ×3 IMPLANT
DRAPE LAPAROSCOPIC ABDOMINAL (DRAPES) ×6 IMPLANT
DRSG PAD ABDOMINAL 8X10 ST (GAUZE/BANDAGES/DRESSINGS) ×6 IMPLANT
DRSG TEGADERM 4X4.75 (GAUZE/BANDAGES/DRESSINGS) ×3 IMPLANT
ELECT COATED BLADE 2.86 ST (ELECTRODE) ×3 IMPLANT
ELECT REM PT RETURN 9FT ADLT (ELECTROSURGICAL) ×3
ELECTRODE REM PT RTRN 9FT ADLT (ELECTROSURGICAL) ×2 IMPLANT
EVACUATOR SILICONE 100CC (DRAIN) ×3 IMPLANT
GAUZE SPONGE 4X4 12PLY STRL LF (GAUZE/BANDAGES/DRESSINGS) ×3 IMPLANT
GLOVE BIO SURGEON STRL SZ 6.5 (GLOVE) ×6 IMPLANT
GLOVE BIO SURGEON STRL SZ7 (GLOVE) ×6 IMPLANT
GLOVE BIOGEL PI IND STRL 7.5 (GLOVE) ×4 IMPLANT
GLOVE BIOGEL PI INDICATOR 7.5 (GLOVE) ×2
GOWN PREVENTION PLUS XLARGE (GOWN DISPOSABLE) ×15 IMPLANT
HEMOSTAT SNOW SURGICEL 2X4 (HEMOSTASIS) ×3 IMPLANT
IV HEPARIN 1000UNITS/500ML (IV SOLUTION) ×6 IMPLANT
IV KIT MINILOC 20X1 SAFETY (NEEDLE) IMPLANT
KIT PORT POWER 8FR ISP CVUE (Catheter) ×3 IMPLANT
KIT POWER CATH 8FR (Catheter) IMPLANT
NDL SAFETY ECLIPSE 18X1.5 (NEEDLE) IMPLANT
NEEDLE HYPO 18GX1.5 SHARP (NEEDLE)
NEEDLE HYPO 25X1 1.5 SAFETY (NEEDLE) ×6 IMPLANT
NS IRRIG 1000ML POUR BTL (IV SOLUTION) ×3 IMPLANT
PACK BASIN DAY SURGERY FS (CUSTOM PROCEDURE TRAY) ×3 IMPLANT
PENCIL BUTTON HOLSTER BLD 10FT (ELECTRODE) ×6 IMPLANT
PIN SAFETY STERILE (MISCELLANEOUS) ×3 IMPLANT
SHEET MEDIUM DRAPE 40X70 STRL (DRAPES) ×6 IMPLANT
SLEEVE SCD COMPRESS KNEE MED (MISCELLANEOUS) ×3 IMPLANT
SPONGE GAUZE 4X4 12PLY (GAUZE/BANDAGES/DRESSINGS) ×3 IMPLANT
SPONGE LAP 4X18 X RAY DECT (DISPOSABLE) ×6 IMPLANT
STAPLER VISISTAT 35W (STAPLE) ×3 IMPLANT
STOCKINETTE IMPERVIOUS LG (DRAPES) ×3 IMPLANT
STRIP CLOSURE SKIN 1/2X4 (GAUZE/BANDAGES/DRESSINGS) ×3 IMPLANT
SUT ETHILON 2 0 FS 18 (SUTURE) ×3 IMPLANT
SUT MNCRL AB 4-0 PS2 18 (SUTURE) ×6 IMPLANT
SUT MON AB 4-0 PC3 18 (SUTURE) ×3 IMPLANT
SUT PROLENE 2 0 SH DA (SUTURE) ×3 IMPLANT
SUT SILK 2 0 SH (SUTURE) IMPLANT
SUT SILK 2 0 TIES 17X18 (SUTURE)
SUT SILK 2-0 18XBRD TIE BLK (SUTURE) IMPLANT
SUT VIC AB 2-0 SH 27 (SUTURE) ×1
SUT VIC AB 2-0 SH 27XBRD (SUTURE) ×2 IMPLANT
SUT VIC AB 3-0 SH 27 (SUTURE) ×1
SUT VIC AB 3-0 SH 27X BRD (SUTURE) ×2 IMPLANT
SUT VICRYL AB 2 0 TIE (SUTURE) IMPLANT
SUT VICRYL AB 2 0 TIES (SUTURE)
SUT VICRYL AB 3 0 TIES (SUTURE) IMPLANT
SYR 5ML LUER SLIP (SYRINGE) ×6 IMPLANT
SYR CONTROL 10ML LL (SYRINGE) ×6 IMPLANT
TOWEL OR 17X24 6PK STRL BLUE (TOWEL DISPOSABLE) ×6 IMPLANT
TOWEL OR NON WOVEN STRL DISP B (DISPOSABLE) ×3 IMPLANT
TUBE CONNECTING 20X1/4 (TUBING) ×6 IMPLANT
WATER STERILE IRR 1000ML POUR (IV SOLUTION) IMPLANT
YANKAUER SUCT BULB TIP NO VENT (SUCTIONS) ×3 IMPLANT

## 2012-04-04 NOTE — Anesthesia Postprocedure Evaluation (Signed)
  Anesthesia Post-op Note  Patient: Alicia Pena  Procedure(s) Performed: Procedure(s) (LRB) with comments: AXILLARY LYMPH NODE DISSECTION (Left) - Left Axillary lymph node dissection, Port a cath placement INSERTION PORT-A-CATH (Right)  Patient Location: PACU  Anesthesia Type: General  Level of Consciousness: awake and alert   Airway and Oxygen Therapy: Patient Spontanous Breathing and Patient connected to face mask oxygen  Post-op Pain: mild  Post-op Assessment: Post-op Vital signs reviewed  Post-op Vital Signs: Reviewed  Complications: No apparent anesthesia complications

## 2012-04-04 NOTE — Transfer of Care (Signed)
Immediate Anesthesia Transfer of Care Note  Patient: Alicia Pena  Procedure(s) Performed: Procedure(s) (LRB) with comments: AXILLARY LYMPH NODE DISSECTION (Left) - Left Axillary lymph node dissection, Port a cath placement INSERTION PORT-A-CATH (Right)  Patient Location: PACU  Anesthesia Type: General  Level of Consciousness: awake  Airway & Oxygen Therapy: Patient Spontanous Breathing and Patient connected to face mask oxygen  Post-op Assessment: Report given to PACU RN and Post -op Vital signs reviewed and stable  Post vital signs: Reviewed and stable  Complications: No apparent anesthesia complications

## 2012-04-04 NOTE — Op Note (Signed)
Preoperative diagnosis: Clinical stage II left breast cancer status post mastectomy and sentinel mode biopsy Postoperative diagnosis: Same as above Procedure: #1 completion left axillary node dissection #2 right subclavian power port placement Surgeon: Dr. Harden Mo Anesthesia: Gen. With LMA Estimated blood loss: 75 cc complications: None Drains: 19 French Blake drain to left axilla Specimens: Left axillary contents Sponge and needle count correct x2 at end of operation Disposition to recovery stable  Indications: This 41 year old female recently underwent a left mastectomy and sentinel node biopsy. Her final pathology on her nose and had been 2 out of 2 sentinel nodes positive for carcinoma with extracapsular extension. She has been seen in a multidisciplinary fashion and she is now scheduled to begin chemotherapy we have all agreed on a completion left axillary dissection for her. We discussed the risks and benefits of these procedures prior to beginning.  Procedure: After informed consent was obtained the patient was taken to the operative. She was given 2 g of intravenous cefazolin without trouble. Sequential compression devices were on the legs. I did her axillary dissection first. We prepped her left mastectomy and left axilla in the standard sterile surgical fashion. Surgical timeout was then performed.  I lengthened her mastectomy incision laterally. I removed the old sutures. I removed her old drain. I then approached the axilla. This was very difficult as there was a lot of scar tissue from her sentinel node biopsies. Eventually I was able to identify the axillary vein, the thoracodorsal bundle, and long thoracic nerve. The structures were all preserved.I swept the tissue from the vein caudad. This again was difficult as there was a fair amount of scar tissue present. I placed some clips on some small veins. Eventually I was able to remove all this tissue down to the muscle. There  were no more level II were 3 axillary contents present upon completion. There was a little bit of oozing from some of the raw surface area after I irrigated. I placed a small piece of Surgicel overlying this. I then placed a new 42 [place] Blake drain and secured this with 2-0 nylon. This was all hemostatic upon completion. I then closed this with 3-0 Vicryl and 4-0 Monocryl. Dermabond and Steri-Strips were placed. This eventually was covered with an ABD pad and gauze as well as a breast binder.  I then turned my attention to the port. She was reprepped and draped. Both arms were tucked and an axillary roll was placed. I went to the right side for the port. I accessed her subclavian vein on the second pass. The first did not get good blood flow. This was after I injected quarter percent Marcaine throughout this region as well as on her clavicle. I then inserted the wire. This went over to her subclavian a number of times eventually using fluoroscopy I was able to get this to track down into her cava. I then made a pocket for the port overlying the pectoralis fascia. I tunneled the line between the two. I then dilated up the tract. I then inserted the peel-away sheath under fluoroscopy and removed the wire assembly. There was a small amount of ectopy during this portion of the procedure. I then inserted the line and removed the peel-away sheath. I pulled back the line under fluoroscopy to be in the cava. Appeared to be in good position. I then attached the port. I sewed the port in 3 places with 2-0 Prolene suture. The port flushed easily and aspirated. I packed this  with heparin. This was closed with 3-0 Vicryl, 4-0 Monocryl, and Dermabond. She tolerated all of this well was extubated and transferred to recovery stable.

## 2012-04-04 NOTE — Interval H&P Note (Signed)
History and Physical Interval Note:  04/04/2012 7:30 AM  Rubbie Battiest  has presented today for surgery, with the diagnosis of breast cancer  The various methods of treatment have been discussed with the patient and family. After consideration of risks, benefits and other options for treatment, the patient has consented to  Procedure(s) (LRB) with comments: AXILLARY LYMPH NODE DISSECTION (Left) - Left Axillary lymph node dissection, Port a cath placement INSERTION PORT-A-CATH (N/A) as a surgical intervention .  The patient's history has been reviewed, patient examined, no change in status, stable for surgery.  I have reviewed the patient's chart and labs.  Questions were answered to the patient's satisfaction.     Pietrina Jagodzinski

## 2012-04-04 NOTE — H&P (View-Only) (Signed)
Subjective:     Patient ID: Alicia Pena, female   DOB: 08/22/1970, 40 y.o.   MRN: [id number]  HPI 40-year-old female who is status post a left mastectomy and sentinel node biopsy. Her nodes were positive. She has done well from the operation and returns today with more than 30 cc still coming out of her drain. She has also been seen by medical and radiation oncology since then. I discussed their recommendations with her today also.she also complains of some swelling around her left axilla as well as pain in her shoulder.  Review of Systems     Objective:   Physical Exam Well-healing incision without infection and draining with serous fluid present    Assessment:     Node positive left breast cancer status post mastectomy    Plan:     I reviewed today my recommendations for port placement and axillary node dissection. We specifically discussed the risks and benefits of both of those procedures today. I think for cancer it is important to do a node dissection given the fact that she has had a mastectomy as well as her age. She is not specifically meet the criteria for Z11. We specifically discussed the risk moving forward of an axillary dissection including lymphedema, shoulder pain, numbness around this area. She is left-handed is very concerned about this which I think is appropriate. We discussed the need for physical therapy and seen a lymphedema specialist postoperatively I think that is even more important in light of the fact a short he has some symptoms after the surgery. We will plan on performing this procedure next week.      

## 2012-04-04 NOTE — Anesthesia Procedure Notes (Signed)
Procedure Name: LMA Insertion Performed by: Janaya Broy W Pre-anesthesia Checklist: Patient identified, Timeout performed, Emergency Drugs available, Suction available and Patient being monitored Patient Re-evaluated:Patient Re-evaluated prior to inductionOxygen Delivery Method: Circle system utilized Preoxygenation: Pre-oxygenation with 100% oxygen Intubation Type: IV induction Ventilation: Mask ventilation without difficulty LMA: LMA inserted LMA Size: 4.0 Number of attempts: 1 Placement Confirmation: breath sounds checked- equal and bilateral and positive ETCO2 Tube secured with: Tape Dental Injury: Teeth and Oropharynx as per pre-operative assessment      

## 2012-04-04 NOTE — Anesthesia Preprocedure Evaluation (Addendum)
Anesthesia Evaluation  Patient identified by MRN, date of birth, ID band Patient awake    Reviewed: Allergy & Precautions, H&P , NPO status , Patient's Chart, lab work & pertinent test results  History of Anesthesia Complications (+) PONV  Airway Mallampati: III TM Distance: >3 FB Neck ROM: Full    Dental No notable dental hx. (+) Teeth Intact and Dental Advisory Given   Pulmonary neg pulmonary ROS,  breath sounds clear to auscultation  Pulmonary exam normal       Cardiovascular negative cardio ROS  Rhythm:Regular Rate:Normal     Neuro/Psych PSYCHIATRIC DISORDERS Cerebral Palsy    GI/Hepatic negative GI ROS, Neg liver ROS,   Endo/Other  negative endocrine ROS  Renal/GU negative Renal ROS  negative genitourinary   Musculoskeletal   Abdominal   Peds  Hematology negative hematology ROS (+)   Anesthesia Other Findings   Reproductive/Obstetrics negative OB ROS                           Anesthesia Physical Anesthesia Plan  ASA: III  Anesthesia Plan: General   Post-op Pain Management:    Induction: Intravenous  Airway Management Planned: LMA  Additional Equipment:   Intra-op Plan:   Post-operative Plan: Extubation in OR  Informed Consent: I have reviewed the patients History and Physical, chart, labs and discussed the procedure including the risks, benefits and alternatives for the proposed anesthesia with the patient or authorized representative who has indicated his/her understanding and acceptance.   Dental advisory given  Plan Discussed with: CRNA, Anesthesiologist and Surgeon  Anesthesia Plan Comments:        Anesthesia Quick Evaluation

## 2012-04-05 ENCOUNTER — Encounter (HOSPITAL_BASED_OUTPATIENT_CLINIC_OR_DEPARTMENT_OTHER): Payer: Self-pay | Admitting: General Surgery

## 2012-04-05 ENCOUNTER — Telehealth (INDEPENDENT_AMBULATORY_CARE_PROVIDER_SITE_OTHER): Payer: Self-pay | Admitting: General Surgery

## 2012-04-05 ENCOUNTER — Other Ambulatory Visit (HOSPITAL_COMMUNITY): Payer: PRIVATE HEALTH INSURANCE

## 2012-04-05 ENCOUNTER — Encounter: Payer: Self-pay | Admitting: *Deleted

## 2012-04-05 ENCOUNTER — Telehealth: Payer: Self-pay | Admitting: *Deleted

## 2012-04-05 MED ORDER — OXYCODONE-ACETAMINOPHEN 5-325 MG PO TABS: 1.0000 | ORAL_TABLET | ORAL | Status: DC | PRN

## 2012-04-05 NOTE — Progress Notes (Signed)
1 Day Post-Op  Subjective: No complaints  Objective: Vital signs in last 24 hours: Temp:  [97.8 F (36.6 C)-98.3 F (36.8 C)] 97.8 F (36.6 C) (10/04 0000) Pulse Rate:  [82-124] 82  (10/04 0000) Resp:  [14-23] 16  (10/04 0000) BP: (117-139)/(71-90) 117/72 mmHg (10/04 0000) SpO2:  [90 %-100 %] 96 % (10/04 0000)    Intake/Output from previous day: 10/03 0701 - 10/04 0700 In: 4362.5 [P.O.:1550; I.V.:2812.5] Out: 2150 [Urine:2050; Drains:100] Intake/Output this shift: Total I/O In: 220 [P.O.:220] Out: -   drain with expected serosang output  Lab Results:   Basename 04/04/12 0708 04/02/12 1100  WBC -- 8.6  HGB 11.5* 11.2*  HCT -- 35.5*  PLT -- 322   BMET  Basename 04/02/12 1100  NA 138  K 3.9  CL 103  CO2 25  GLUCOSE 86  BUN 16  CREATININE 0.49*  CALCIUM 9.0   PT/INR No results found for this basename: LABPROT:2,INR:2 in the last 72 hours ABG No results found for this basename: PHART:2,PCO2:2,PO2:2,HCO3:2 in the last 72 hours  Studies/Results: Dg Chest Port 1 View  04/04/2012  *RADIOLOGY REPORT*  Clinical Data: Post right-sided subclavian port placement  PORTABLE CHEST - 1 VIEW  Comparison: None.  Findings:  Borderline enlarged cardiac silhouette and mediastinal contours, likely accentuated due to decreased lung volumes.  Right subclavian vein approach port a catheter tip overlies the superior cavoatrial junction.  Left lateral chest wall/breast surgical drain.  Surgical clips overlie the left axilla.  There is mild elevation of the right hemidiaphragm with bibasilar heterogeneous opacities, right greater than left.  No definite pneumothorax.  No definite pleural effusion.  Unchanged bones.  IMPRESSION: 1.  Right subclavian vein approach port a catheter tip overlies the superior cavoatrial junction.  No pneumothorax. 2.  Left lateral chest wall/breast surgical drain with left axillary surgical clips. 3.  Decreased lung volumes and elevation of the right hemidiaphragm  with bibasilar opacities, right greater than left, likely atelectasis.   Original Report Authenticated By: Waynard Reeds, M.D.    Dg Fluoro Guide Cv Line-no Report  04/04/2012  CLINICAL DATA: Port a cath   FLOURO GUIDE CV LINE  Fluoroscopy was utilized by the requesting physician.  No radiographic  interpretation.      Anti-infectives: Anti-infectives     Start     Dose/Rate Route Frequency Ordered Stop   04/04/12 0636   ceFAZolin (ANCEF) IVPB 2 g/50 mL premix        2 g 100 mL/hr over 30 Minutes Intravenous 60 min pre-op 04/04/12 0636 04/04/12 0739          Assessment/Plan: s/p Procedure(s) (LRB) with comments: AXILLARY LYMPH NODE DISSECTION (Left) - Left Axillary lymph node dissection, Port a cath placement INSERTION PORT-A-CATH (Right) dc home today  LOS: 1 day    [facility] 04/05/2012

## 2012-04-05 NOTE — Telephone Encounter (Signed)
Called to f/u with pt after port placement and ALND.  Pt relate she is "a little sore".  Confirmed future appts.  Informed pt that we will get her scheduled for chemo class and nutrition consult.  Pt denies further needs or concerns at this time.  Encourage pt to call with questions.  Received verbal understanding.  Contact information given.  Gave emotional support.

## 2012-04-05 NOTE — Telephone Encounter (Signed)
Spoke with pt and informed her that we have set up an appt for her to see Dr. Dwain Sarna on 10/11 at 10:40.  She was okay with this.

## 2012-04-05 NOTE — Telephone Encounter (Signed)
Left voice message inform the patient of the new dates and times for  Please schedule nutrition consult and chemo class next week.

## 2012-04-08 ENCOUNTER — Encounter: Payer: Self-pay | Admitting: *Deleted

## 2012-04-08 ENCOUNTER — Encounter (INDEPENDENT_AMBULATORY_CARE_PROVIDER_SITE_OTHER): Payer: Self-pay | Admitting: General Surgery

## 2012-04-08 ENCOUNTER — Telehealth (INDEPENDENT_AMBULATORY_CARE_PROVIDER_SITE_OTHER): Payer: Self-pay | Admitting: General Surgery

## 2012-04-08 ENCOUNTER — Ambulatory Visit (INDEPENDENT_AMBULATORY_CARE_PROVIDER_SITE_OTHER): Payer: PRIVATE HEALTH INSURANCE | Admitting: General Surgery

## 2012-04-08 ENCOUNTER — Ambulatory Visit (HOSPITAL_COMMUNITY)
Admission: RE | Admit: 2012-04-08 | Discharge: 2012-04-08 | Disposition: A | Discharge status: Home / Self Care | Payer: PRIVATE HEALTH INSURANCE | Source: Ambulatory Visit | Attending: Surgery | Admitting: Surgery

## 2012-04-08 VITALS — BP 138/84 | HR 90 | Temp 98.7°F | Resp 18 | Ht 60.0 in | Wt 157.0 lb

## 2012-04-08 DIAGNOSIS — M7989 Other specified soft tissue disorders: Secondary | ICD-10-CM

## 2012-04-08 DIAGNOSIS — Z5189 Encounter for other specified aftercare: Secondary | ICD-10-CM

## 2012-04-08 DIAGNOSIS — Z4889 Encounter for other specified surgical aftercare: Secondary | ICD-10-CM

## 2012-04-08 DIAGNOSIS — C50919 Malignant neoplasm of unspecified site of unspecified female breast: Secondary | ICD-10-CM

## 2012-04-08 NOTE — Telephone Encounter (Signed)
Patient calling status post mastectomy on Thursday 04/04/2012. She called this weekend with some drainage around mastectomy drains. She changed the bandage and milked the drains and she is having no more problems with drainage. She is calling because she is having bilateral leg swelling since Thursday that is not getting better. She states it is both legs under the knees. She had some swelling in her hands as well but that is getting better. She is drinking plenty of fluids and walking some. Please advise.

## 2012-04-08 NOTE — Progress Notes (Signed)
[facility] Brief Psychosocial Assessment Clinical Social Work  Clinical Social Work was referred by patient navigator for assessment of psychosocial needs.  Clinical Social Worker contacted patient at home to offer support and assess for needs.  Pt stated she was doing "ok", but was planning to see the doctor today because of swelling in her feet.  Pt also expressed some frustration with having multiple physicians at different locations.  CSW and pt processed all of her physicians and their locations.  Pt is scheduled for chemo class on October 9th, and plans to meet with CSW.  CSW informed pt of the pt and family support team and encouraged her to call with any questions or concerns.        Tamala Julian, MSW, LCSW Clinical Social Worker [facility] [phone]

## 2012-04-08 NOTE — Telephone Encounter (Signed)
Preliminary venous doppler report states no bilateral DVT's.

## 2012-04-08 NOTE — Telephone Encounter (Signed)
Per Dr Luisa Hart, patient needs bilateral venous study to evaluate for DVT's.

## 2012-04-08 NOTE — Progress Notes (Signed)
Bilateral:  No evidence of DVT, superficial thrombosis, or Baker's Cyst.   

## 2012-04-08 NOTE — Telephone Encounter (Signed)
Spoke with pt and informed her that we are sending her for a US venous img of lower legs.  Informed her she needs to go to [facility] 1st floor and she needs to arrive at 2:45.  She was fine with this.

## 2012-04-08 NOTE — Progress Notes (Signed)
Subjective:     Patient ID: Alicia Pena, female   DOB: [birth year], 41 y.o.   MRN: [id number]  HPI S/p left completion axillary dissection last week.  The drain had been putting out ss output until the output decreased and she had some pain in her axilla then had spontaneous drainage of serous output and improvement in her discomfort and mobility.  She had a similar occurrence this afternoon with drainage on her binder.  She has had 20ml of light ss output since this morning.  No fevers   Review of Systems     Objective:   Physical Exam No distress and nontoxic-appearing Her incision appears to be healing well without sign of infection. There is no obvious fluid collection in the axilla and no evidence of axillary infection. I do see her old bandage which has some serous drainage on this and some very thin serosanguineous output and the JP bulb currently. I do not see any active drainage from around the drain site. I milked the drain and there does not appear to be any visible occlusion at least in the external portion of the drain.    Assessment:     Status post left axillary dissection She may have a clot in drains causing her to drain the fluid from the drain site itself. I do not see any evidence of infection or undrained fluid collection currently. I do not see any need for antibiotics. Regardless if the drain is occluded or patent it seems that she is draining adequately currently.     Plan:     We will leave the drain in place and see what happens over the next few days. She is scheduled to see Dr. Dwain Sarna back for repeat evaluation in a few days and if the drain is not working at that time that he can remove it. Regardless she is draining the fluid from her axilla because there does not appear to be any residual fluid collection in the axilla currently.

## 2012-04-09 ENCOUNTER — Encounter (HOSPITAL_COMMUNITY)
Admission: RE | Admit: 2012-04-09 | Discharge: 2012-04-09 | Disposition: A | Discharge status: Home / Self Care | Payer: PRIVATE HEALTH INSURANCE | Source: Ambulatory Visit | Attending: Oncology | Admitting: Oncology

## 2012-04-09 ENCOUNTER — Encounter (HOSPITAL_COMMUNITY): Payer: Self-pay

## 2012-04-09 ENCOUNTER — Ambulatory Visit: Payer: PRIVATE HEALTH INSURANCE | Admitting: Oncology

## 2012-04-09 ENCOUNTER — Ambulatory Visit (HOSPITAL_COMMUNITY)
Admission: RE | Admit: 2012-04-09 | Discharge: 2012-04-09 | Disposition: A | Discharge status: Home / Self Care | Payer: PRIVATE HEALTH INSURANCE | Source: Ambulatory Visit | Attending: Oncology | Admitting: Oncology

## 2012-04-09 DIAGNOSIS — C50419 Malignant neoplasm of upper-outer quadrant of unspecified female breast: Secondary | ICD-10-CM

## 2012-04-09 MED ORDER — FLUDEOXYGLUCOSE F - 18 (FDG) INJECTION
17.1000 | Freq: Once | INTRAVENOUS | Status: AC | PRN
  Administered 2012-04-09: 17.1 via INTRAVENOUS

## 2012-04-09 NOTE — Progress Notes (Signed)
  Echocardiogram 2D Echocardiogram has been performed.  Cathie Beams 04/09/2012, 12:43 PM

## 2012-04-10 ENCOUNTER — Ambulatory Visit: Payer: PRIVATE HEALTH INSURANCE | Admitting: Nutrition

## 2012-04-10 ENCOUNTER — Other Ambulatory Visit: Payer: PRIVATE HEALTH INSURANCE

## 2012-04-10 LAB — GLUCOSE, CAPILLARY: Glucose-Capillary: 92 mg/dL (ref 70–99)

## 2012-04-10 NOTE — Progress Notes (Signed)
This is a 41 year old female patient of Dr. Renelda Loma diagnosed with breast cancer.  Past medical history includes anemia, anxiety, and cerebral palsy.  Medications include Xanax, Ativan, and multivitamin.  Labs were reviewed.  Height: 60 inches. Weight: 157 pounds. Usual body weight: 157 pounds. BMI: 30.66 which is obese.  Patient and 2 family members present to nutrition consult. Patient has just been through chemotherapy education class. She reports she is status post surgery and getting ready for chemotherapy and then radiation treatment. Patient would like education on healthy diet during chemotherapy. Dietary recall reveals patient consumes fast food, sugar sweetened cereals, and frozen meals. She consumes approximately 2 servings of fruits and vegetables daily and refined grains. She consumes juices or juice drinks most of the day.  Nutrition diagnosis: Food and nutrition related knowledge deficit related to new diagnosis of breast cancer and associated treatments as evidenced by no prior need for nutrition related information.  Intervention: I educated patient and family on the importance of increasing plant-based foods. I have encouraged patient to work in a protein source at breakfast. We have discussed consuming a lower fat diet. I've educated her on appropriate snacks and meal examples that she could begin to incorporate. I've also educated her on substitution she can begin to make to improve her overall diet. I have reviewed tips for breast cancer patients, organic foods, and plant based diet tips. I provided fact sheets on these topics. Patient was able to teach back strategies for incorporating small changes into her meals daily.  Monitoring, evaluation, goals: Patient will tolerate small changes to make improvements to her diet focusing on increasing fruits and vegetables and plant based foods.  Next visit: Patient has my contact information for questions or concerns.

## 2012-04-12 ENCOUNTER — Encounter (INDEPENDENT_AMBULATORY_CARE_PROVIDER_SITE_OTHER): Payer: Self-pay | Admitting: General Surgery

## 2012-04-12 ENCOUNTER — Ambulatory Visit (INDEPENDENT_AMBULATORY_CARE_PROVIDER_SITE_OTHER): Payer: PRIVATE HEALTH INSURANCE | Admitting: General Surgery

## 2012-04-12 ENCOUNTER — Ambulatory Visit (HOSPITAL_BASED_OUTPATIENT_CLINIC_OR_DEPARTMENT_OTHER): Payer: PRIVATE HEALTH INSURANCE | Admitting: Oncology

## 2012-04-12 ENCOUNTER — Telehealth: Payer: Self-pay | Admitting: *Deleted

## 2012-04-12 VITALS — BP 122/74 | HR 69 | Temp 98.0°F | Resp 18 | Ht 60.0 in | Wt 155.0 lb

## 2012-04-12 VITALS — BP 132/84 | HR 83 | Temp 98.4°F | Resp 20 | Ht 60.0 in | Wt 156.6 lb

## 2012-04-12 DIAGNOSIS — C773 Secondary and unspecified malignant neoplasm of axilla and upper limb lymph nodes: Secondary | ICD-10-CM

## 2012-04-12 DIAGNOSIS — Z09 Encounter for follow-up examination after completed treatment for conditions other than malignant neoplasm: Secondary | ICD-10-CM

## 2012-04-12 DIAGNOSIS — C50419 Malignant neoplasm of upper-outer quadrant of unspecified female breast: Secondary | ICD-10-CM

## 2012-04-12 DIAGNOSIS — C50919 Malignant neoplasm of unspecified site of unspecified female breast: Secondary | ICD-10-CM

## 2012-04-12 MED ORDER — LIDOCAINE-PRILOCAINE 2.5-2.5 % EX CREA: TOPICAL_CREAM | CUTANEOUS | Status: DC | PRN

## 2012-04-12 MED ORDER — PROCHLORPERAZINE 25 MG RE SUPP: 25.0000 mg | Freq: Two times a day (BID) | RECTAL | Status: DC | PRN

## 2012-04-12 MED ORDER — DEXAMETHASONE 4 MG PO TABS: 8.0000 mg | ORAL_TABLET | Freq: Two times a day (BID) | ORAL | Status: DC

## 2012-04-12 MED ORDER — PROCHLORPERAZINE MALEATE 10 MG PO TABS: 10.0000 mg | ORAL_TABLET | Freq: Four times a day (QID) | ORAL | Status: DC | PRN

## 2012-04-12 MED ORDER — ONDANSETRON HCL 8 MG PO TABS: 8.0000 mg | ORAL_TABLET | Freq: Two times a day (BID) | ORAL | Status: DC

## 2012-04-12 MED ORDER — LORAZEPAM 0.5 MG PO TABS: 0.5000 mg | ORAL_TABLET | Freq: Four times a day (QID) | ORAL | Status: DC | PRN

## 2012-04-12 NOTE — Telephone Encounter (Signed)
Gave patient appointment for 04-26-2012 starting at 11:00am

## 2012-04-12 NOTE — Progress Notes (Signed)
Hematology and Oncology Follow Up Visit  Alicia Pena [id number] [birth year] 41 y.o. 04/12/2012 1:24 PM   DIAGNOSIS:   No diagnosis found.   PAST THERAPY: Mastectomy 03/21/2012 with sentinel lymph node evaluation.    Interim History:  Patient returns for followup. Chair mastectomy and sentinel lymph node evaluation. As noted in the patches multifocal disease largest tumor 3.3 cm. The larger tumor was also HER-2 positive with a ratio of 3.3. 2 sentinel lymph nodes identified the larger one had extracapsular extension but does have disease in both lymph nodes. She has recovered from surgery.she has since had a node dissection and had a port placed. The final athology showed 3/8  Nodes involved for a total of 5/10 involved (N2a). Staging PET scan was essentially negative. Medications: I have reviewed the patient's current medications.  Allergies:  Allergies  Allergen Reactions  . Duricef (Cefadroxil) Hives and Nausea Only    Past Medical History, Surgical history, Social history, and Family History were reviewed and updated.  Review of Systems: Constitutional:  Negative for fever, chills, night sweats, anorexia, weight loss, pain. Cardiovascular: no chest pain or dyspnea on exertion Respiratory: negative Neurological: negative Dermatological: negative ENT: negative Skin Gastrointestinal: negative Genito-Urinary: negative Hematological and Lymphatic: negative Breast: negative Musculoskeletal: negative Remaining ROS negative.  Physical Exam:  Blood pressure 132/84, pulse 83, temperature 98.4 F (36.9 C), temperature source Oral, resp. rate 20, height 5' (1.524 m), weight 156 lb 9.6 oz (71.033 kg).  ECOG:  0  HEENT:  Sclerae anicteric, conjunctivae pink.  Oropharynx clear.  No mucositis or candidiasis.  Nodes:  No cervical, supraclavicular, or axillary lymphadenopathy palpated.  Breast Exam:  Right breast is benign.  No masses, discharge, skin change, or nipple inversion.   Left breast is status post mastectomy drains in place. Lungs:  Clear to auscultation bilaterally.  No crackles, rhonchi, or wheezes.  Heart:  Regular rate and rhythm.  Abdomen:  Soft, nontender.  Positive bowel sounds.  No organomegaly or masses palpated.  Musculoskeletal:  No focal spinal tenderness to palpation.  Extremities:  Benign.  No peripheral edema or cyanosis.  Skin:  Benign.  Neuro:  Nonfocal.    Lab Results: Lab Results  Component Value Date   WBC 8.6 04/02/2012   HGB 11.5* 04/04/2012   HCT 35.5* 04/02/2012   MCV 75.1* 04/02/2012   PLT 322 04/02/2012     Chemistry      Component Value Date/Time   NA 138 04/02/2012 1100   NA 137 02/27/2012 1611   K 3.9 04/02/2012 1100   K 3.6 02/27/2012 1611   CL 103 04/02/2012 1100   CL 106 02/27/2012 1611   CO2 25 04/02/2012 1100   CO2 22 02/27/2012 1611   BUN 16 04/02/2012 1100   BUN 10.0 02/27/2012 1611   CREATININE 0.49* 04/02/2012 1100   CREATININE 0.6 02/27/2012 1611      Component Value Date/Time   CALCIUM 9.0 04/02/2012 1100   CALCIUM 8.9 02/27/2012 1611   ALKPHOS 93 02/27/2012 1611   AST 14 02/27/2012 1611   ALT 15 02/27/2012 1611   BILITOT 0.90 02/27/2012 1611       Radiological Studies:  Mr Biopsy/wire Localization  02/29/2012  **ADDENDUM** CREATED: 02/29/2012 15:10:41  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  No further evaluation is necessary of the right breast.  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  These results are discussed with the patient by telephone  at 1:15 p.m.  She states the right side is healing well without pain or bruising.  She does have some tenderness on the left side without bruising or discharge.  She has an appointment with Dr. Dwain Sarna tomorrow to discuss surgical treatment planning.  Addended by:  Hiram Gash, M.D. on 02/29/2012 15:10:41.  **END ADDENDUM** SIGNED BY: Hiram Gash, M.D.    02/28/2012  *RADIOLOGY REPORT*  Clinical Data:  Recent diagnosis of left breast cancer, diagnosed at two sites, following two separate ultrasound-guided core needle biopsies (the smaller of the two masses was originally detected by MRI).  Recent biopsy of a right breast mass demonstrating fibroadenoma.  Additionally, biopsy is recommended of a 7 mm enhancing mass in the central right breast, and of an additional enhancing nodule in the left breast, to assess extent of disease.  MRI GUIDED VACUUM ASSISTED BIOPSY OF THE LEFT AND RIGHT BREASTS WITHOUT AND WITH CONTRAST  Comparison: Previous exams, including breast MRI of February 11, 2012.  Technique: Multiplanar, multisequence MR images of the left and right breasts were obtained prior to and following the intravenous administration of 14 ml of Mulithance.  I met with the patient, and we discussed the procedure of MRI guided biopsy, including risks, benefits, and alternatives. Specifically, we discussed the risks of infection, bleeding, tissue injury, clip migration, and inadequate sampling.  Informed, written consent was given.  Using sterile technique, 2% Lidocaine, MRI guidance, and a 9 gauge vacuum assisted device, biopsy was performed of an oval approximately 6-7 mm enhancing nodule in the posterior third of the upper central right breast using a lateral to medial approach. It is noted that the enhancing nodule in the right breast appears more posterior in position than it did on the original MRI of February 11, 2012, but given that this is the only enhancing mass in the right breast (aside from the previously diagnosed fibroadenoma in the anterior right breast), it was biopsied. It is possible that the apparent difference in position is related to the difference in compression by the grid and positioning of the right breast at time of biopsy, compared to prior breast MRI.  Using sterile technique, 2% lidocaine, MRI guidance, an 9-gauge vacuum-assisted device,  biopsy was performed of a 5-6 mm enhancing nodule in the anterior left breast, superior to the nipple in the upper central left breast, and far anterior to the recently diagnosed dominant breast malignancy.  At the conclusion of the procedure a dumbbell shaped biopsy clip was deployed into the biopsy cavity the right breast.  At the conclusion of the procedure, a cylindrical tissue marker clip was deployed into the biopsy cavity of the left breast.  IMPRESSION: MRI guided biopsy of both breasts No apparent complications.  THREE-DIMENSIONAL MR IMAGE RENDERING ON INDEPENDENT WORKSTATION:  Three-dimensional MR images were rendered by post-processing of the original MR data on an independent workstation.  The three- dimensional MR images were interpreted, and findings were reported in the accompanying complete MRI report for this study.   Original Report Authenticated By: Hiram Gash, M.D.    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  No further evaluation is necessary of the right breast.  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  These results are discussed with the patient by telephone at 1:15 p.m.  She states the right side is healing well without pain  or bruising.  She does have some tenderness on the left side without bruising or discharge.  She has an appointment with Dr. Dwain Sarna tomorrow to discuss surgical treatment planning.  Addended by:  Hiram Gash, M.D. on 02/29/2012 15:10:41.  **END ADDENDUM** SIGNED BY: Hiram Gash, M.D.   02/28/2012  *RADIOLOGY REPORT*  Clinical Data:  Recent diagnosis of left breast cancer, diagnosed at two sites, following two separate ultrasound-guided core needle biopsies (the smaller of the two masses was originally detected by MRI).  Recent biopsy of a right breast  mass demonstrating fibroadenoma.  Additionally, biopsy is recommended of a 7 mm enhancing mass in the central right breast, and of an additional enhancing nodule in the left breast, to assess extent of disease.  MRI GUIDED VACUUM ASSISTED BIOPSY OF THE LEFT AND RIGHT BREASTS WITHOUT AND WITH CONTRAST  Comparison: Previous exams, including breast MRI of February 11, 2012.  Technique: Multiplanar, multisequence MR images of the left and right breasts were obtained prior to and following the intravenous administration of 14 ml of Mulithance.  I met with the patient, and we discussed the procedure of MRI guided biopsy, including risks, benefits, and alternatives. Specifically, we discussed the risks of infection, bleeding, tissue injury, clip migration, and inadequate sampling.  Informed, written consent was given.  Using sterile technique, 2% Lidocaine, MRI guidance, and a 9 gauge vacuum assisted device, biopsy was performed of an oval approximately 6-7 mm enhancing nodule in the posterior third of the upper central right breast using a lateral to medial approach. It is noted that the enhancing nodule in the right breast appears more posterior in position than it did on the original MRI of February 11, 2012, but given that this is the only enhancing mass in the right breast (aside from the previously diagnosed fibroadenoma in the anterior right breast), it was biopsied. It is possible that the apparent difference in position is related to the difference in compression by the grid and positioning of the right breast at time of biopsy, compared to prior breast MRI.  Using sterile technique, 2% lidocaine, MRI guidance, an 9-gauge vacuum-assisted device, biopsy was performed of a 5-6 mm enhancing nodule in the anterior left breast, superior to the nipple in the upper central left breast, and far anterior to the recently diagnosed dominant breast malignancy.  At the conclusion of the procedure a dumbbell shaped biopsy clip was  deployed into the biopsy cavity the right breast.  At the conclusion of the procedure, a cylindrical tissue marker clip was deployed into the biopsy cavity of the left breast.  IMPRESSION: MRI guided biopsy of both breasts No apparent complications.  THREE-DIMENSIONAL MR IMAGE RENDERING ON INDEPENDENT WORKSTATION:  Three-dimensional MR images were rendered by post-processing of the original MR data on an independent workstation.  The three- dimensional MR images were interpreted, and findings were reported in the accompanying complete MRI report for this study.   Original Report Authenticated By: Hiram Gash, M.D.    Nm Sentinel Node Inj-no Rpt (breast)  03/21/2012  CLINICAL DATA: left axillary sentinel node biopsy   Sulfur colloid was injected intradermally by the nuclear medicine  technologist for breast cancer sentinel node localization.     Mm Digital Diagnostic Bilat  02/28/2012  *RADIOLOGY REPORT*  Clinical Data:  Recent diagnosis of left breast cancer, multifocal, at two sites.  Biopsy was performed of a third enhancing nodule in the left breast today, the most anterior of the nodules. Additionally, biopsy was performed  of an approximately 7 mm enhancing nodule in the upper central right breast.  DIGITAL DIAGNOSTIC BILATERAL MAMMOGRAM  Comparison:  Previous exams  Findings:  Films are performed following bilateral MRI guided biopsies.  In the left breast, a cylindrical biopsy clip was placed at the time of today's MRI-guided biopsy.  It is in the expected location of the biopsy, in the anterior third of the upper central left breast.  The biopsy clip placed today is 7 cm anterior and medial (measured on the CC view) to the central aspect of the dominant mass which was proven to be malignant on recent ultrasound guided core needle biopsy.  In the right breast 8 pounds of shaped biopsy clip is present in the upper and slightly outer right breast near the middle to posterior thirds.  This is in the  general expected location of the biopsied mass  IMPRESSION: Satisfactory position of bilateral biopsy clips as described above.   Original Report Authenticated By: Britta Mccreedy, M.D.    Diagnosis 1. 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 - MARGINS NOT INVOLVED. - FOCAL LYMPHATIC VASCULAR INVOLVEMENT AND PERINEURAL INVOLVEMENT BY TUMOR. 2. Lymph node, sentinel, biopsy, left axillary #1 - METASTATIC CARCINOMA IN ONE LYMPH NODE WITH EXTRACAPSULAR EXTENSION. (1/1). 3. Lymph node, sentinel, biopsy, left axillary #2 - METASTATIC CARCINOMA IN ONE LYMPH NODE (1/  IMPRESSIONS AND PLAN: A 41 y.o. female with   History of HER-2 positive breast cancer with multifocal disease and 5 involved LN. She still has a drain in place. We discussed starting [facility] chemotherapy with in the next few weeks. We will see her again in 2 weeks/. Spent more than half the time coordinating care, as well as discussion of BMI and its implications.      Duvan Mousel 10/11/20131:24 PM Cell [id number]

## 2012-04-12 NOTE — Progress Notes (Signed)
Subjective:     Patient ID: Alicia Pena, female   DOB: 1971/01/06, 41 y.o.   MRN: [id number]  HPI This is a 41 year old female who underwent a left mastectomy and sentinel node biopsy for a multifocal left breast cancer. Her nodes in that have been positive. She was returned to the operating room and underwent a completion left axillary dissection as well as a right subclavian port placement. She returns today doing well. She had some lower chimney swelling that is present still. This is getting better. She has a negative duplex. Her drain is to put an hour 30 cc per day right now. Her pathology is returned at 8 additional lymph nodes with 3 showing metastatic ductal carcinoma. This upgrades her to an N2A.  Review of Systems     Objective:   Physical Exam Healing left mastectomy incision, drain with cloudy fluid as I left some snow in place this is to be expected Right port site clean without infection    Assessment:     Stage IIIa left breast cancer status post  Modified radical mastectomy    Plan:     She is due to see medical oncology. I'm going to refer her to see physical therapy. I will likely see her back next week to remove the drain. She will call on Monday with her output.

## 2012-04-18 ENCOUNTER — Encounter (INDEPENDENT_AMBULATORY_CARE_PROVIDER_SITE_OTHER): Payer: PRIVATE HEALTH INSURANCE | Admitting: General Surgery

## 2012-04-19 ENCOUNTER — Telehealth (INDEPENDENT_AMBULATORY_CARE_PROVIDER_SITE_OTHER): Payer: Self-pay | Admitting: General Surgery

## 2012-04-19 NOTE — Telephone Encounter (Signed)
She called about a different odor from the drainage.  She says that she does not have any increased, pain, redness, or fevers.  She also says that the drain fluid has not changed over the last few days.  About 15-32ml of output/day.  I recommended that if she has any  increased, pain, redness, or fevers then she should come to the Er for evaluation.  Otherwise, I think that it is okay for her to continue current drain care and follow up with Dwain Sarna at her regular appointment on Monday.

## 2012-04-22 ENCOUNTER — Encounter (INDEPENDENT_AMBULATORY_CARE_PROVIDER_SITE_OTHER): Payer: Self-pay | Admitting: General Surgery

## 2012-04-22 ENCOUNTER — Ambulatory Visit (INDEPENDENT_AMBULATORY_CARE_PROVIDER_SITE_OTHER): Payer: PRIVATE HEALTH INSURANCE | Admitting: General Surgery

## 2012-04-22 VITALS — BP 132/74 | HR 88 | Temp 97.4°F | Resp 20 | Ht 60.0 in | Wt 156.4 lb

## 2012-04-22 DIAGNOSIS — Z09 Encounter for follow-up examination after completed treatment for conditions other than malignant neoplasm: Secondary | ICD-10-CM

## 2012-04-22 NOTE — Progress Notes (Signed)
Subjective:     Patient ID: Alicia Pena, female   DOB: March 18, 1971, 41 y.o.   MRN: [id number]  HPI This is a 41 year old female who underwent a left mastectomy and sentinel node biopsy for a multifocal left breast cancer. She was returned to the operating room and underwent a completion left axillary dissection as well as a right subclavian port placement. She returns today doing well. Her drain is to putting out less than 20 cc per day right now. She is otherwise doing well.  The drain output is serous.She is due to begin chemo around first week of November   Review of Systems     Objective:   Physical Exam Healing left mastectomy incision, some extra skin as she plans on reconstruction, drain with serous fluid, her left arm is stiff   port site clean without infection Assessment:     S/p left mrm    Plan:     I removed her drain today. I placed some gauze over this. This will continue to drain hopefully will seal in the next several days. I gave her a prescription for postmastectomy supplies. She is due to see physical therapy on Wednesday which I think is going to be very poor and for her. I will plan on seeing her back in a month or sooner she needs anything before then.

## 2012-04-23 ENCOUNTER — Encounter (INDEPENDENT_AMBULATORY_CARE_PROVIDER_SITE_OTHER): Payer: Self-pay | Admitting: General Surgery

## 2012-04-24 ENCOUNTER — Ambulatory Visit: Payer: PRIVATE HEALTH INSURANCE | Attending: General Surgery | Admitting: Physical Therapy

## 2012-04-24 DIAGNOSIS — IMO0001 Reserved for inherently not codable concepts without codable children: Secondary | ICD-10-CM | POA: Insufficient documentation

## 2012-04-24 DIAGNOSIS — M25519 Pain in unspecified shoulder: Secondary | ICD-10-CM | POA: Insufficient documentation

## 2012-04-24 DIAGNOSIS — M24519 Contracture, unspecified shoulder: Secondary | ICD-10-CM | POA: Insufficient documentation

## 2012-04-25 ENCOUNTER — Ambulatory Visit: Payer: PRIVATE HEALTH INSURANCE | Admitting: Physical Therapy

## 2012-04-25 ENCOUNTER — Other Ambulatory Visit: Payer: Self-pay | Admitting: *Deleted

## 2012-04-25 DIAGNOSIS — C50419 Malignant neoplasm of upper-outer quadrant of unspecified female breast: Secondary | ICD-10-CM

## 2012-04-26 ENCOUNTER — Telehealth: Payer: Self-pay | Admitting: Oncology

## 2012-04-26 ENCOUNTER — Ambulatory Visit (HOSPITAL_BASED_OUTPATIENT_CLINIC_OR_DEPARTMENT_OTHER): Payer: PRIVATE HEALTH INSURANCE | Admitting: Oncology

## 2012-04-26 ENCOUNTER — Other Ambulatory Visit (HOSPITAL_BASED_OUTPATIENT_CLINIC_OR_DEPARTMENT_OTHER): Payer: PRIVATE HEALTH INSURANCE | Admitting: Lab

## 2012-04-26 VITALS — BP 148/92 | HR 99 | Temp 97.3°F | Resp 20 | Ht 60.0 in | Wt 153.3 lb

## 2012-04-26 DIAGNOSIS — C773 Secondary and unspecified malignant neoplasm of axilla and upper limb lymph nodes: Secondary | ICD-10-CM

## 2012-04-26 DIAGNOSIS — C50419 Malignant neoplasm of upper-outer quadrant of unspecified female breast: Secondary | ICD-10-CM

## 2012-04-26 DIAGNOSIS — C50919 Malignant neoplasm of unspecified site of unspecified female breast: Secondary | ICD-10-CM

## 2012-04-26 LAB — CBC WITH DIFFERENTIAL/PLATELET
BASO%: 0.5 % (ref 0.0–2.0)
EOS%: 2.8 % (ref 0.0–7.0)
HCT: 35.3 % (ref 34.8–46.6)
LYMPH%: 24.4 % (ref 14.0–49.7)
MCH: 23.5 pg — ABNORMAL LOW (ref 25.1–34.0)
MCHC: 31.2 g/dL — ABNORMAL LOW (ref 31.5–36.0)
MCV: 75.4 fL — ABNORMAL LOW (ref 79.5–101.0)
MONO%: 6.7 % (ref 0.0–14.0)
NEUT%: 65.6 % (ref 38.4–76.8)
lymph#: 1.6 10*3/uL (ref 0.9–3.3)

## 2012-04-26 NOTE — Progress Notes (Signed)
Hematology and Oncology Follow Up Visit  Alicia Pena [id number] [birth year] 40 y.o. 04/28/2012 11:45 PM   DIAGNOSIS:   Encounter Diagnosis  Name Primary?  . Breast cancer Yes     PAST THERAPY:  T2N2a Her2+ breast cancer, s/p mastectomy  Interim History:  Recently had drain removed, healing well.  Medications: I have reviewed the patient's current medications.  Allergies:  Allergies  Allergen Reactions  . Duricef (Cefadroxil) Hives and Nausea Only    Past Medical History, Surgical history, Social history, and Family History were reviewed and updated.  Review of Systems: Constitutional:  Negative for fever, chills, night sweats, anorexia, weight loss, pain. Cardiovascular: no chest pain or dyspnea on exertion Respiratory: no cough, shortness of breath, or wheezing Neurological: no TIA or stroke symptoms Dermatological: negative ENT: negative Skin Gastrointestinal: negative Genito-Urinary: negative Hematological and Lymphatic: negative Breast: negative Musculoskeletal: negative Remaining ROS negative.  Physical Exam:  Blood pressure 148/92, pulse 99, temperature 97.3 F (36.3 C), temperature source Oral, resp. rate 20, height 5' (1.524 m), weight 153 lb 4.8 oz (69.536 kg).  ECOG: 0   HEENT:  Sclerae anicteric, conjunctivae pink.  Oropharynx clear.  No mucositis or candidiasis.  Nodes:  No cervical, supraclavicular, or axillary lymphadenopathy palpated.  Breast Exam:  Right breast is benign.  No masses, discharge, skin change, or nipple inversion.  Left breast is surgically absent., scars are healing well..  Lungs:  Clear to auscultation bilaterally.  No crackles, rhonchi, or wheezes.  Heart:  Regular rate and rhythm.  Abdomen:  Soft, nontender.  Positive bowel sounds.  No organomegaly or masses palpated.  Musculoskeletal:  No focal spinal tenderness to palpation.  Extremities:  Benign.  No peripheral edema or cyanosis.  Skin:  Benign.  Neuro:  Nonfocal.    Lab  Results: Lab Results  Component Value Date   WBC 6.4 04/26/2012   HGB 11.0* 04/26/2012   HCT 35.3 04/26/2012   MCV 75.4* 04/26/2012   PLT 370 04/26/2012     Chemistry      Component Value Date/Time   NA 138 04/02/2012 1100   NA 137 02/27/2012 1611   K 3.9 04/02/2012 1100   K 3.6 02/27/2012 1611   CL 103 04/02/2012 1100   CL 106 02/27/2012 1611   CO2 25 04/02/2012 1100   CO2 22 02/27/2012 1611   BUN 16 04/02/2012 1100   BUN 10.0 02/27/2012 1611   CREATININE 0.49* 04/02/2012 1100   CREATININE 0.6 02/27/2012 1611      Component Value Date/Time   CALCIUM 9.0 04/02/2012 1100   CALCIUM 8.9 02/27/2012 1611   ALKPHOS 93 02/27/2012 1611   AST 14 02/27/2012 1611   ALT 15 02/27/2012 1611   BILITOT 0.90 02/27/2012 1611       Radiological Studies:  Nm Pet Image Initial (pi) Skull Base To Thigh  04/09/2012  *RADIOLOGY REPORT*  Clinical Data: Initial treatment strategy for new diagnosis of breast cancer.  NUCLEAR MEDICINE PET SKULL BASE TO THIGH  Fasting Blood Glucose:  92  Technique:  17.1 mCi F-18 FDG was injected intravenously. CT data was obtained and used for attenuation correction and anatomic localization only.  (This was not acquired as a diagnostic CT examination.) Additional exam technical data entered on technologist worksheet.  Comparison:  MRI breast of 02/11/2012.  Chest film of 04/04/2012.  Findings:  Neck: No abnormal hypermetabolism.  Chest:  Low level hypermetabolism corresponds to  surgical clips and edema within the left axilla and left chest wall.  This  is consistent with postoperative change.  No focal abnormality within either axilla or the right breast.  Abdomen/Pelvis:  No abnormal hypermetabolism.  Skeleton:  No abnormal bony hypermetabolism.  There is right upper extremity hypermetabolism which is presumably the site of injection with minimal extravasation.  CT  images performed for attenuation correction demonstrate no significant findings within the neck.  A right-sided Port-A-Cath  terminates at the high right atrium. Mild cardiomegaly.  Convex left lumbar spine curvature.  Hysterectomy.  Sclerotic lesion within the right femoral neck which is not significantly hypermetabolic.  This measures 1.6 cm on image 195.  IMPRESSION:  1.  Surgical changes within the left chest and axilla.  No evidence of hypermetabolic metastatic disease. 2. Not hypermetabolic sclerotic lesion in the posterior right femoral head is most likely an enchondroma or bone island.   Original Report Authenticated By: Consuello Bossier, M.D.    Dg Chest Port 1 View  04/04/2012  *RADIOLOGY REPORT*  Clinical Data: Post right-sided subclavian port placement  PORTABLE CHEST - 1 VIEW  Comparison: None.  Findings:  Borderline enlarged cardiac silhouette and mediastinal contours, likely accentuated due to decreased lung volumes.  Right subclavian vein approach port a catheter tip overlies the superior cavoatrial junction.  Left lateral chest wall/breast surgical drain.  Surgical clips overlie the left axilla.  There is mild elevation of the right hemidiaphragm with bibasilar heterogeneous opacities, right greater than left.  No definite pneumothorax.  No definite pleural effusion.  Unchanged bones.  IMPRESSION: 1.  Right subclavian vein approach port a catheter tip overlies the superior cavoatrial junction.  No pneumothorax. 2.  Left lateral chest wall/breast surgical drain with left axillary surgical clips. 3.  Decreased lung volumes and elevation of the right hemidiaphragm with bibasilar opacities, right greater than left, likely atelectasis.   Original Report Authenticated By: Waynard Reeds, M.D.    Dg Fluoro Guide Cv Line-no Report  04/04/2012  CLINICAL DATA: Port a cath   FLOURO GUIDE CV LINE  Fluoroscopy was utilized by the requesting physician.  No radiographic  interpretation.       IMPRESSIONS AND PLAN: A 41 y.o. female with   Hx of multifocal breast cancer, her2+, she has had her port placed and we anticipate  starting chemotherapy on 11/1; the schedule was reviewed with the patient. We reviewed side effects of treatment.  Spent more than half the time coordinating care, as well as discussion of BMI and its implications.      Alicia Cavan 10/27/201311:45 PM Cell [id number]       Nael Petrosyan 10/25/201312:43 PM Cell [phone]

## 2012-04-26 NOTE — Telephone Encounter (Signed)
gve the pt her nov 2013 appt calendar. Sent michelle a staff message to add the chemo appts. Pt is aware.  Wants [place] to email her schedule

## 2012-04-26 NOTE — Patient Instructions (Addendum)
  Chemo to start 11/1; f/u 11/8  Every 3 weeks  X 6, with weekly herceptin.

## 2012-04-29 ENCOUNTER — Ambulatory Visit: Payer: PRIVATE HEALTH INSURANCE | Admitting: Physical Therapy

## 2012-05-02 ENCOUNTER — Telehealth: Payer: Self-pay | Admitting: *Deleted

## 2012-05-02 ENCOUNTER — Ambulatory Visit: Payer: PRIVATE HEALTH INSURANCE | Admitting: Physical Therapy

## 2012-05-02 NOTE — Telephone Encounter (Signed)
Alicia Pena called asking for the decadron to be called to pharmacist because she did not receive this and she is to take this the day before chemotherapy.   Called Pharnacist, Nedra Hai and reviwed all medicines ordered on October 11th.  [facility] has all meds on file and have already filled emla, compazine pills and supp.  The lorazepam, ondansetron and dexamethasone will be filled today:

## 2012-05-02 NOTE — Telephone Encounter (Signed)
Pharmacist called reporting patient says we sent generic zofran but they have not received this yet.  Noted this with the encounter on 04-12-2012.  Pharmacist given this order at this time.

## 2012-05-03 ENCOUNTER — Other Ambulatory Visit (HOSPITAL_BASED_OUTPATIENT_CLINIC_OR_DEPARTMENT_OTHER): Payer: PRIVATE HEALTH INSURANCE | Admitting: Lab

## 2012-05-03 ENCOUNTER — Encounter: Payer: PRIVATE HEALTH INSURANCE | Admitting: Physical Therapy

## 2012-05-03 ENCOUNTER — Ambulatory Visit (HOSPITAL_BASED_OUTPATIENT_CLINIC_OR_DEPARTMENT_OTHER): Payer: PRIVATE HEALTH INSURANCE

## 2012-05-03 ENCOUNTER — Other Ambulatory Visit: Payer: Self-pay | Admitting: *Deleted

## 2012-05-03 ENCOUNTER — Other Ambulatory Visit: Payer: Self-pay | Admitting: Oncology

## 2012-05-03 VITALS — BP 128/80 | HR 64 | Temp 97.6°F | Resp 20

## 2012-05-03 DIAGNOSIS — C50419 Malignant neoplasm of upper-outer quadrant of unspecified female breast: Secondary | ICD-10-CM

## 2012-05-03 DIAGNOSIS — C50919 Malignant neoplasm of unspecified site of unspecified female breast: Secondary | ICD-10-CM

## 2012-05-03 DIAGNOSIS — Z5111 Encounter for antineoplastic chemotherapy: Secondary | ICD-10-CM

## 2012-05-03 LAB — CBC WITH DIFFERENTIAL/PLATELET
EOS%: 0 % (ref 0.0–7.0)
LYMPH%: 6 % — ABNORMAL LOW (ref 14.0–49.7)
MCH: 23.8 pg — ABNORMAL LOW (ref 25.1–34.0)
MCHC: 31.6 g/dL (ref 31.5–36.0)
MCV: 75.5 fL — ABNORMAL LOW (ref 79.5–101.0)
MONO%: 2.3 % (ref 0.0–14.0)
Platelets: 362 10*3/uL (ref 145–400)
RBC: 4.74 10*6/uL (ref 3.70–5.45)
RDW: 16.1 % — ABNORMAL HIGH (ref 11.2–14.5)

## 2012-05-03 LAB — COMPREHENSIVE METABOLIC PANEL (CC13)
ALT: 12 U/L (ref 0–55)
AST: 8 U/L (ref 5–34)
Alkaline Phosphatase: 81 U/L (ref 40–150)
Calcium: 9.9 mg/dL (ref 8.4–10.4)
Chloride: 111 mEq/L — ABNORMAL HIGH (ref 98–107)
Creatinine: 0.6 mg/dL (ref 0.6–1.1)
Potassium: 4 mEq/L (ref 3.5–5.1)

## 2012-05-03 MED ORDER — SODIUM CHLORIDE 0.9 % IJ SOLN
10.0000 mL | INTRAMUSCULAR | Status: DC | PRN
  Administered 2012-05-03: 10 mL
  Filled 2012-05-03: qty 10

## 2012-05-03 MED ORDER — DOCETAXEL CHEMO INJECTION 160 MG/16ML
75.0000 mg/m2 | Freq: Once | INTRAVENOUS | Status: AC
  Administered 2012-05-03: 130 mg via INTRAVENOUS
  Filled 2012-05-03: qty 13

## 2012-05-03 MED ORDER — LORAZEPAM 2 MG/ML IJ SOLN
0.2500 mg | Freq: Once | INTRAMUSCULAR | Status: AC
Start: 2012-05-03 — End: 2012-05-03
  Administered 2012-05-03: 2 mg via INTRAVENOUS

## 2012-05-03 MED ORDER — TRASTUZUMAB CHEMO INJECTION 440 MG
4.0000 mg/kg | Freq: Once | INTRAVENOUS | Status: AC
  Administered 2012-05-03: 273 mg via INTRAVENOUS
  Filled 2012-05-03: qty 13

## 2012-05-03 MED ORDER — ACETAMINOPHEN 325 MG PO TABS
650.0000 mg | ORAL_TABLET | Freq: Once | ORAL | Status: AC
  Administered 2012-05-03: 650 mg via ORAL

## 2012-05-03 MED ORDER — SODIUM CHLORIDE 0.9 % IV SOLN
600.0000 mg | Freq: Once | INTRAVENOUS | Status: AC
  Administered 2012-05-03: 600 mg via INTRAVENOUS
  Filled 2012-05-03: qty 60

## 2012-05-03 MED ORDER — ONDANSETRON 16 MG/50ML IVPB (CHCC)
16.0000 mg | Freq: Once | INTRAVENOUS | Status: AC
  Administered 2012-05-03: 16 mg via INTRAVENOUS

## 2012-05-03 MED ORDER — DIPHENHYDRAMINE HCL 25 MG PO CAPS
50.0000 mg | ORAL_CAPSULE | Freq: Once | ORAL | Status: AC
  Administered 2012-05-03: 50 mg via ORAL

## 2012-05-03 MED ORDER — HEPARIN SOD (PORK) LOCK FLUSH 100 UNIT/ML IV SOLN
500.0000 [IU] | Freq: Once | INTRAVENOUS | Status: AC | PRN
  Administered 2012-05-03: 500 [IU]
  Filled 2012-05-03: qty 5

## 2012-05-03 MED ORDER — SODIUM CHLORIDE 0.9 % IV SOLN
Freq: Once | INTRAVENOUS | Status: AC
  Administered 2012-05-03: 10:00:00 via INTRAVENOUS

## 2012-05-03 MED ORDER — DEXAMETHASONE SODIUM PHOSPHATE 4 MG/ML IJ SOLN
20.0000 mg | Freq: Once | INTRAMUSCULAR | Status: AC
  Administered 2012-05-03: 20 mg via INTRAVENOUS

## 2012-05-03 NOTE — Patient Instructions (Addendum)
[facility] Discharge Instructions for Patients Receiving Chemotherapy  Today you received the following chemotherapy agents Taxotere, Carboplatin and Herceptin.  To help prevent nausea and vomiting after your treatment, we encourage you to take your nausea medication as directed.  Dexamethasone (DECADRON) 4 MG tabletTake 2 tablets (8 mg total) by mouth 2 (two) times daily with a meal. Take two times a day the day before Taxotere. Then take two times a day starting the day after chemo for 3 days, sat/sun/mon. Zofran (Ondansetron) 8mg  tabs, 1 every 12 hours as needed for nausea. Start at bedtime tonight. Prochlorperazine(Compazine) 10 mg, Take 1 tablet every 6 hours as needed for nausea and vomiting. Prochlorperazine (COMPAZINE) 25 MG suppository  Place 1 suppository (25 mg total) rectally every 12 (twelve) hours as needed for nausea. - Rectal LORazepam (ATIVAN) 0.5 MG tablet Take 1 tablet (0.5 mg total) by mouth every 6 (six) hours as needed (Nausea or vomiting)     If you develop nausea and vomiting that is not controlled by your nausea medication, call the clinic. If it is after clinic hours your family physician or the after hours number for the clinic or go to the Emergency Department.   BELOW ARE SYMPTOMS THAT SHOULD BE REPORTED IMMEDIATELY:  *FEVER GREATER THAN 100.5 F  *CHILLS WITH OR WITHOUT FEVER  NAUSEA AND VOMITING THAT IS NOT CONTROLLED WITH YOUR NAUSEA MEDICATION  *UNUSUAL SHORTNESS OF BREATH  *UNUSUAL BRUISING OR BLEEDING  TENDERNESS IN MOUTH AND THROAT WITH OR WITHOUT PRESENCE OF ULCERS  *URINARY PROBLEMS  *BOWEL PROBLEMS  UNUSUAL RASH Items with * indicate a potential emergency and should be followed up as soon as possible.  One of the nurses will contact you 24 hours after your treatment. Please let the nurse know about any problems that you may have experienced. Feel free to call the clinic you have any questions or concerns. The clinic phone  number is [phone].

## 2012-05-04 ENCOUNTER — Ambulatory Visit (HOSPITAL_BASED_OUTPATIENT_CLINIC_OR_DEPARTMENT_OTHER): Payer: PRIVATE HEALTH INSURANCE

## 2012-05-04 VITALS — BP 138/88 | HR 103 | Temp 97.3°F

## 2012-05-04 DIAGNOSIS — C50919 Malignant neoplasm of unspecified site of unspecified female breast: Secondary | ICD-10-CM

## 2012-05-04 DIAGNOSIS — C50419 Malignant neoplasm of upper-outer quadrant of unspecified female breast: Secondary | ICD-10-CM

## 2012-05-04 MED ORDER — PEGFILGRASTIM INJECTION 6 MG/0.6ML
6.0000 mg | Freq: Once | SUBCUTANEOUS | Status: AC
  Administered 2012-05-04: 6 mg via SUBCUTANEOUS

## 2012-05-06 ENCOUNTER — Ambulatory Visit: Payer: PRIVATE HEALTH INSURANCE

## 2012-05-06 ENCOUNTER — Telehealth: Payer: Self-pay | Admitting: *Deleted

## 2012-05-06 ENCOUNTER — Other Ambulatory Visit: Payer: Self-pay | Admitting: Certified Registered Nurse Anesthetist

## 2012-05-06 NOTE — Telephone Encounter (Signed)
Message copied by Augusto Garbe on Mon May 06, 2012 10:51 AM ------      Message from: [place], [place] E      Created: Fri May 03, 2012  4:33 PM      Regarding: New chemo       First Taxotere/Carbo/Herceptin

## 2012-05-06 NOTE — Telephone Encounter (Signed)
Patient at work today.  Working half days but would like a note from Dr. Donnie Coffin to work full time for this week.  Has previously left this request on collaborative voicemail before this call.  Reports feeling okay and having more side effects from the injection than the chemotherapy.  Also reports he bowels are sluggish.  Not drinking 64 oz water but will try to increase due to bowels.  Reports she will get claritin because she has only taken tylenol and this hasn't helped the discomfort  from the neulasta.  Denies questions at this time.

## 2012-05-07 ENCOUNTER — Ambulatory Visit: Payer: PRIVATE HEALTH INSURANCE | Admitting: Physical Therapy

## 2012-05-08 ENCOUNTER — Other Ambulatory Visit: Payer: Self-pay | Admitting: *Deleted

## 2012-05-08 DIAGNOSIS — C50919 Malignant neoplasm of unspecified site of unspecified female breast: Secondary | ICD-10-CM

## 2012-05-09 ENCOUNTER — Ambulatory Visit: Payer: PRIVATE HEALTH INSURANCE | Admitting: Physical Therapy

## 2012-05-09 ENCOUNTER — Telehealth: Payer: Self-pay | Admitting: *Deleted

## 2012-05-09 NOTE — Telephone Encounter (Signed)
Patient called reporting reaction to chemotherapy.  Received first carbo/taxot/hercep on 05-03-2012 with neulasta support.  Reports body aches but on yesterday she awoke with sore throat, headache and now can't talk.  Taking all of her effort to talk during this call.  Job requires her to talk.  Also not eating because it hurts to eat.  Denies fever and no red or white spots noted in her mouth.  Scheduled for lab, MD f/u and herceptin tomorrow.  Will notify providers.  Instructed to drink lemon tea and do mouth gargles to treat the symptoms.  Ms. Alicia Pena is going to stay home from work due to these symptoms and can be reached at home if any new orders.

## 2012-05-10 ENCOUNTER — Ambulatory Visit (HOSPITAL_BASED_OUTPATIENT_CLINIC_OR_DEPARTMENT_OTHER): Payer: PRIVATE HEALTH INSURANCE

## 2012-05-10 ENCOUNTER — Other Ambulatory Visit (HOSPITAL_BASED_OUTPATIENT_CLINIC_OR_DEPARTMENT_OTHER): Payer: PRIVATE HEALTH INSURANCE | Admitting: Lab

## 2012-05-10 ENCOUNTER — Telehealth: Payer: Self-pay | Admitting: Oncology

## 2012-05-10 ENCOUNTER — Ambulatory Visit (HOSPITAL_BASED_OUTPATIENT_CLINIC_OR_DEPARTMENT_OTHER): Payer: PRIVATE HEALTH INSURANCE | Admitting: Oncology

## 2012-05-10 VITALS — BP 138/87 | HR 91 | Temp 97.8°F | Resp 20 | Ht 60.0 in | Wt 151.4 lb

## 2012-05-10 DIAGNOSIS — C50419 Malignant neoplasm of upper-outer quadrant of unspecified female breast: Secondary | ICD-10-CM

## 2012-05-10 DIAGNOSIS — C50919 Malignant neoplasm of unspecified site of unspecified female breast: Secondary | ICD-10-CM

## 2012-05-10 DIAGNOSIS — Z5112 Encounter for antineoplastic immunotherapy: Secondary | ICD-10-CM

## 2012-05-10 LAB — CBC WITH DIFFERENTIAL/PLATELET
BASO%: 0.2 % (ref 0.0–2.0)
Basophils Absolute: 0 10*3/uL (ref 0.0–0.1)
EOS%: 0.2 % (ref 0.0–7.0)
HCT: 35.1 % (ref 34.8–46.6)
LYMPH%: 15.8 % (ref 14.0–49.7)
MCH: 23.7 pg — ABNORMAL LOW (ref 25.1–34.0)
MCHC: 32 g/dL (ref 31.5–36.0)
MCV: 74 fL — ABNORMAL LOW (ref 79.5–101.0)
MONO%: 8.7 % (ref 0.0–14.0)
NEUT%: 75.1 % (ref 38.4–76.8)
lymph#: 2.7 10*3/uL (ref 0.9–3.3)

## 2012-05-10 MED ORDER — TRASTUZUMAB CHEMO INJECTION 440 MG
2.0000 mg/kg | Freq: Once | INTRAVENOUS | Status: AC
  Administered 2012-05-10: 147 mg via INTRAVENOUS
  Filled 2012-05-10: qty 7

## 2012-05-10 MED ORDER — SODIUM CHLORIDE 0.9 % IJ SOLN
10.0000 mL | INTRAMUSCULAR | Status: DC | PRN
  Administered 2012-05-10: 10 mL
  Filled 2012-05-10: qty 10

## 2012-05-10 MED ORDER — DIPHENHYDRAMINE HCL 25 MG PO CAPS
50.0000 mg | ORAL_CAPSULE | Freq: Once | ORAL | Status: AC
  Administered 2012-05-10: 50 mg via ORAL

## 2012-05-10 MED ORDER — ACETAMINOPHEN 325 MG PO TABS
650.0000 mg | ORAL_TABLET | Freq: Once | ORAL | Status: AC
  Administered 2012-05-10: 650 mg via ORAL

## 2012-05-10 MED ORDER — HEPARIN SOD (PORK) LOCK FLUSH 100 UNIT/ML IV SOLN
500.0000 [IU] | Freq: Once | INTRAVENOUS | Status: AC | PRN
  Administered 2012-05-10: 500 [IU]
  Filled 2012-05-10: qty 5

## 2012-05-10 MED ORDER — SODIUM CHLORIDE 0.9 % IV SOLN
Freq: Once | INTRAVENOUS | Status: AC
  Administered 2012-05-10: 13:00:00 via INTRAVENOUS

## 2012-05-10 NOTE — Patient Instructions (Addendum)
[facility] Discharge Instructions for Patients Receiving Chemotherapy  Today you received the following chemotherapy agents herceptin  To help prevent nausea and vomiting after your treatment, we encourage you to take your nausea medication zofran Begin taking it at  tonight and take it as often as prescribed.  Herceptin usually does not cause significant nausea.   If you develop nausea and vomiting that is not controlled by your nausea medication, call the clinic. If it is after clinic hours your family physician or the after hours number for the clinic or go to the Emergency Department.   BELOW ARE SYMPTOMS THAT SHOULD BE REPORTED IMMEDIATELY:  *FEVER GREATER THAN 100.5 F  *CHILLS WITH OR WITHOUT FEVER  NAUSEA AND VOMITING THAT IS NOT CONTROLLED WITH YOUR NAUSEA MEDICATION  *UNUSUAL SHORTNESS OF BREATH  *UNUSUAL BRUISING OR BLEEDING  TENDERNESS IN MOUTH AND THROAT WITH OR WITHOUT PRESENCE OF ULCERS  *URINARY PROBLEMS  *BOWEL PROBLEMS  UNUSUAL RASH Items with * indicate a potential emergency and should be followed up as soon as possible.  . Feel free to call the clinic you have any questions or concerns. The clinic phone number is [phone].   I have been informed and understand all the instructions given to me. I know to contact the clinic, my physician, or go to the Emergency Department if any problems should occur. I do not have any questions at this time, but understand that I may call the clinic during office hours   should I have any questions or need assistance in obtaining follow up care.    __________________________________________  _____________  __________ Signature of Patient or Authorized Representative            Date                   Time    __________________________________________ Nurse's Signature

## 2012-05-10 NOTE — Telephone Encounter (Signed)
gve the pt her nov 2013 appt calendar. S/w michelle brown to work on the pt's dec tx schedule since she needs it to show to her boss.

## 2012-05-12 NOTE — Progress Notes (Signed)
Hematology and Oncology Follow Up Visit  Alicia Pena [id number] 07/04/1970 41 y.o. 05/12/2012 4:39 PM   DIAGNOSIS:   Encounter Diagnosis  Name Primary?  . Cancer of upper-outer quadrant of female breast Yes     PAST THERAPY:  T2N2a Her2+ breast cancer, s/p mastectomy, s/p TCH, cycle #1  Interim History:  Day 7 , cycle 1 TCH, she did well with treatment. She is still trying to work. She is complaining of a dry mouth, but no sores.  Medications: I have reviewed the patient's current medications.  Allergies:  Allergies  Allergen Reactions  . Duricef (Cefadroxil) Hives and Nausea Only    Past Medical History, Surgical history, Social history, and Family History were reviewed and updated.  Review of Systems: Constitutional:  Negative for fever, chills, night sweats, anorexia, weight loss, pain. Cardiovascular: no chest pain or dyspnea on exertion Respiratory: no cough, shortness of breath, or wheezing Neurological: no TIA or stroke symptoms Dermatological: negative ENT: negative Skin Gastrointestinal: negative Genito-Urinary: negative Hematological and Lymphatic: negative Breast: negative Musculoskeletal: negative Remaining ROS negative.  Physical Exam:  Blood pressure 138/87, pulse 91, temperature 97.8 F (36.6 C), resp. rate 20, height 5' (1.524 m), weight 151 lb 6.4 oz (68.675 kg).  ECOG: 0   HEENT:  Sclerae anicteric, conjunctivae pink.  Oropharynx clear.  No mucositis or candidiasis.  Nodes:  No cervical, supraclavicular, or axillary lymphadenopathy palpated. Port  Lungs:  Clear to auscultation bilaterally.  No crackles, rhonchi, or wheezes.  Heart:  Regular rate and rhythm.  Abdomen:  Soft, nontender.  Positive bowel sounds.  No organomegaly or masses palpated.  Musculoskeletal:  No focal spinal tenderness to palpation.  Extremities:  Benign.  No peripheral edema or cyanosis.  Skin:  Benign.  Neuro:  Nonfocal.    Lab Results: Lab Results  Component Value  Date   WBC 16.9* 05/10/2012   HGB 11.3* 05/10/2012   HCT 35.1 05/10/2012   MCV 74.0* 05/10/2012   PLT 146 05/10/2012     Chemistry      Component Value Date/Time   NA 139 05/03/2012 1044   NA 138 04/02/2012 1100   K 4.0 05/03/2012 1044   K 3.9 04/02/2012 1100   CL 111* 05/03/2012 1044   CL 103 04/02/2012 1100   CO2 20* 05/03/2012 1044   CO2 25 04/02/2012 1100   BUN 13.0 05/03/2012 1044   BUN 16 04/02/2012 1100   CREATININE 0.6 05/03/2012 1044   CREATININE 0.49* 04/02/2012 1100      Component Value Date/Time   CALCIUM 9.9 05/03/2012 1044   CALCIUM 9.0 04/02/2012 1100   ALKPHOS 81 05/03/2012 1044   AST 8 05/03/2012 1044   ALT 12 05/03/2012 1044   BILITOT 0.65 05/03/2012 1044       Radiological Studies:  Nm Pet Image Initial (pi) Skull Base To Thigh  04/09/2012  *RADIOLOGY REPORT*  Clinical Data: Initial treatment strategy for new diagnosis of breast cancer.  NUCLEAR MEDICINE PET SKULL BASE TO THIGH  Fasting Blood Glucose:  92  Technique:  17.1 mCi F-18 FDG was injected intravenously. CT data was obtained and used for attenuation correction and anatomic localization only.  (This was not acquired as a diagnostic CT examination.) Additional exam technical data entered on technologist worksheet.  Comparison:  MRI breast of 02/11/2012.  Chest film of 04/04/2012.  Findings:  Neck: No abnormal hypermetabolism.  Chest:  Low level hypermetabolism corresponds to  surgical clips and edema within the left axilla and left chest wall.  This is consistent with postoperative change.  No focal abnormality within either axilla or the right breast.  Abdomen/Pelvis:  No abnormal hypermetabolism.  Skeleton:  No abnormal bony hypermetabolism.  There is right upper extremity hypermetabolism which is presumably the site of injection with minimal extravasation.  CT  images performed for attenuation correction demonstrate no significant findings within the neck.  A right-sided Port-A-Cath terminates at the high right atrium. Mild  cardiomegaly.  Convex left lumbar spine curvature.  Hysterectomy.  Sclerotic lesion within the right femoral neck which is not significantly hypermetabolic.  This measures 1.6 cm on image 195.  IMPRESSION:  1.  Surgical changes within the left chest and axilla.  No evidence of hypermetabolic metastatic disease. 2. Not hypermetabolic sclerotic lesion in the posterior right femoral head is most likely an enchondroma or bone island.   Original Report Authenticated By: Consuello Bossier, M.D.    Dg Chest Port 1 View  04/04/2012  *RADIOLOGY REPORT*  Clinical Data: Post right-sided subclavian port placement  PORTABLE CHEST - 1 VIEW  Comparison: None.  Findings:  Borderline enlarged cardiac silhouette and mediastinal contours, likely accentuated due to decreased lung volumes.  Right subclavian vein approach port a catheter tip overlies the superior cavoatrial junction.  Left lateral chest wall/breast surgical drain.  Surgical clips overlie the left axilla.  There is mild elevation of the right hemidiaphragm with bibasilar heterogeneous opacities, right greater than left.  No definite pneumothorax.  No definite pleural effusion.  Unchanged bones.  IMPRESSION: 1.  Right subclavian vein approach port a catheter tip overlies the superior cavoatrial junction.  No pneumothorax. 2.  Left lateral chest wall/breast surgical drain with left axillary surgical clips. 3.  Decreased lung volumes and elevation of the right hemidiaphragm with bibasilar opacities, right greater than left, likely atelectasis.   Original Report Authenticated By: Waynard Reeds, M.D.    Dg Fluoro Guide Cv Line-no Report  04/04/2012  CLINICAL DATA: Port a cath   FLOURO GUIDE CV LINE  Fluoroscopy was utilized by the requesting physician.  No radiographic  interpretation.       IMPRESSIONS AND PLAN: A 41 y.o. female with   Hx of multifocal breast cancer, her2+; she is doing well, with minimal side effects. I will see he in 2 weeks , herceptin will  continue weekly.    Delanie Tirrell 11/10/20134:39 PM Cell [phone]       Ivon Roedel 11/10/20134:39 PM Cell [phone]

## 2012-05-13 ENCOUNTER — Ambulatory Visit: Payer: PRIVATE HEALTH INSURANCE | Attending: General Surgery | Admitting: Physical Therapy

## 2012-05-13 DIAGNOSIS — M25519 Pain in unspecified shoulder: Secondary | ICD-10-CM | POA: Insufficient documentation

## 2012-05-13 DIAGNOSIS — M24519 Contracture, unspecified shoulder: Secondary | ICD-10-CM | POA: Insufficient documentation

## 2012-05-13 DIAGNOSIS — IMO0001 Reserved for inherently not codable concepts without codable children: Secondary | ICD-10-CM | POA: Insufficient documentation

## 2012-05-15 ENCOUNTER — Ambulatory Visit: Payer: PRIVATE HEALTH INSURANCE | Admitting: Physical Therapy

## 2012-05-15 NOTE — Addendum Note (Signed)
Encounter addended by: Delynn Flavin, RN on: 05/15/2012  7:58 PM<BR>     Documentation filed: Charges VN

## 2012-05-17 ENCOUNTER — Ambulatory Visit (HOSPITAL_BASED_OUTPATIENT_CLINIC_OR_DEPARTMENT_OTHER): Payer: PRIVATE HEALTH INSURANCE

## 2012-05-17 VITALS — BP 129/77 | HR 88 | Temp 97.4°F

## 2012-05-17 DIAGNOSIS — C50919 Malignant neoplasm of unspecified site of unspecified female breast: Secondary | ICD-10-CM

## 2012-05-17 DIAGNOSIS — C50419 Malignant neoplasm of upper-outer quadrant of unspecified female breast: Secondary | ICD-10-CM

## 2012-05-17 DIAGNOSIS — Z5112 Encounter for antineoplastic immunotherapy: Secondary | ICD-10-CM

## 2012-05-17 MED ORDER — TRASTUZUMAB CHEMO INJECTION 440 MG
2.0000 mg/kg | Freq: Once | INTRAVENOUS | Status: AC
  Administered 2012-05-17: 147 mg via INTRAVENOUS
  Filled 2012-05-17: qty 7

## 2012-05-17 MED ORDER — DIPHENHYDRAMINE HCL 25 MG PO CAPS
50.0000 mg | ORAL_CAPSULE | Freq: Once | ORAL | Status: AC
  Administered 2012-05-17: 50 mg via ORAL

## 2012-05-17 MED ORDER — SODIUM CHLORIDE 0.9 % IV SOLN
Freq: Once | INTRAVENOUS | Status: AC
  Administered 2012-05-17: 50 mL via INTRAVENOUS

## 2012-05-17 MED ORDER — HEPARIN SOD (PORK) LOCK FLUSH 100 UNIT/ML IV SOLN
500.0000 [IU] | Freq: Once | INTRAVENOUS | Status: DC | PRN
  Filled 2012-05-17: qty 5

## 2012-05-17 MED ORDER — SODIUM CHLORIDE 0.9 % IJ SOLN
10.0000 mL | INTRAMUSCULAR | Status: DC | PRN
  Filled 2012-05-17: qty 10

## 2012-05-17 MED ORDER — ACETAMINOPHEN 325 MG PO TABS
650.0000 mg | ORAL_TABLET | Freq: Once | ORAL | Status: AC
  Administered 2012-05-17: 650 mg via ORAL

## 2012-05-17 NOTE — Patient Instructions (Addendum)
[facility] Discharge Instructions for Patients Receiving Chemotherapy  Today you received the following chemotherapy agents Herceptin To help prevent nausea and vomiting after your treatment, we encourage you to take your nausea medication as per Dr. Donnie Coffin  If you develop nausea and vomiting that is not controlled by your nausea medication, call the clinic. If it is after clinic hours your family physician or the after hours number for the clinic or go to the Emergency Department.   BELOW ARE SYMPTOMS THAT SHOULD BE REPORTED IMMEDIATELY:  *FEVER GREATER THAN 100.5 F  *CHILLS WITH OR WITHOUT FEVER  NAUSEA AND VOMITING THAT IS NOT CONTROLLED WITH YOUR NAUSEA MEDICATION  *UNUSUAL SHORTNESS OF BREATH  *UNUSUAL BRUISING OR BLEEDING  TENDERNESS IN MOUTH AND THROAT WITH OR WITHOUT PRESENCE OF ULCERS  *URINARY PROBLEMS  *BOWEL PROBLEMS  UNUSUAL RASH Items with * indicate a potential emergency and should be followed up as soon as possible.  Feel free to call the clinic you have any questions or concerns. The clinic phone number is [phone].   I have been informed and understand all the instructions given to me. I know to contact the clinic, my physician, or go to the Emergency Department if any problems should occur. I do not have any questions at this time, but understand that I may call the clinic during office hours   should I have any questions or need assistance in obtaining follow up care.    __________________________________________  _____________  __________ Signature of Patient or Authorized Representative            Date                   Time    __________________________________________ Nurse's Signature

## 2012-05-20 ENCOUNTER — Ambulatory Visit: Payer: PRIVATE HEALTH INSURANCE | Admitting: Physical Therapy

## 2012-05-23 ENCOUNTER — Ambulatory Visit: Payer: PRIVATE HEALTH INSURANCE | Admitting: Physical Therapy

## 2012-05-23 ENCOUNTER — Ambulatory Visit (INDEPENDENT_AMBULATORY_CARE_PROVIDER_SITE_OTHER): Payer: PRIVATE HEALTH INSURANCE | Admitting: General Surgery

## 2012-05-23 ENCOUNTER — Encounter (INDEPENDENT_AMBULATORY_CARE_PROVIDER_SITE_OTHER): Payer: Self-pay | Admitting: General Surgery

## 2012-05-23 VITALS — BP 142/80 | HR 88 | Temp 99.4°F | Resp 16 | Ht 60.0 in | Wt 157.4 lb

## 2012-05-23 DIAGNOSIS — Z09 Encounter for follow-up examination after completed treatment for conditions other than malignant neoplasm: Secondary | ICD-10-CM

## 2012-05-23 NOTE — Progress Notes (Signed)
Subjective:     Patient ID: Renetha Vero, female   DOB: 26-Sep-1970, 41 y.o.   MRN: [id number]  HPI This is a 41 year old female who underwent a left MRM for a multifocal left breast cancer. She also had a port placed at the same time and is now completed her first cycle of chemotherapy. She is tolerated this well. She's doing well after surgery now and really has no complaints. Her left arm is really returning to normal now.   Review of Systems     Objective:   Physical Exam Left mastectomy incision clean without any evidence of infection     Assessment:     Status post left MRM for multifocal left breast cancer    Plan:     E. She's doing well. She is going to complete chemotherapy. I will plan on seeing her back in 6 months or sooner she. I gave her a release to the [employer] class today.

## 2012-05-24 ENCOUNTER — Ambulatory Visit (HOSPITAL_BASED_OUTPATIENT_CLINIC_OR_DEPARTMENT_OTHER): Payer: PRIVATE HEALTH INSURANCE

## 2012-05-24 ENCOUNTER — Other Ambulatory Visit (HOSPITAL_BASED_OUTPATIENT_CLINIC_OR_DEPARTMENT_OTHER): Payer: PRIVATE HEALTH INSURANCE | Admitting: Lab

## 2012-05-24 ENCOUNTER — Ambulatory Visit (HOSPITAL_BASED_OUTPATIENT_CLINIC_OR_DEPARTMENT_OTHER): Payer: PRIVATE HEALTH INSURANCE | Admitting: Oncology

## 2012-05-24 ENCOUNTER — Ambulatory Visit: Payer: PRIVATE HEALTH INSURANCE | Admitting: Oncology

## 2012-05-24 VITALS — BP 141/79 | HR 108 | Temp 98.3°F | Resp 20 | Ht 60.0 in | Wt 156.6 lb

## 2012-05-24 DIAGNOSIS — Z5112 Encounter for antineoplastic immunotherapy: Secondary | ICD-10-CM

## 2012-05-24 DIAGNOSIS — C50919 Malignant neoplasm of unspecified site of unspecified female breast: Secondary | ICD-10-CM

## 2012-05-24 DIAGNOSIS — Z5111 Encounter for antineoplastic chemotherapy: Secondary | ICD-10-CM

## 2012-05-24 DIAGNOSIS — Z170 Estrogen receptor positive status [ER+]: Secondary | ICD-10-CM

## 2012-05-24 DIAGNOSIS — C50419 Malignant neoplasm of upper-outer quadrant of unspecified female breast: Secondary | ICD-10-CM

## 2012-05-24 LAB — CBC WITH DIFFERENTIAL/PLATELET
BASO%: 0 % (ref 0.0–2.0)
EOS%: 0 % (ref 0.0–7.0)
HCT: 32.6 % — ABNORMAL LOW (ref 34.8–46.6)
MCH: 23.8 pg — ABNORMAL LOW (ref 25.1–34.0)
MCHC: 31 g/dL — ABNORMAL LOW (ref 31.5–36.0)
NEUT%: 85.4 % — ABNORMAL HIGH (ref 38.4–76.8)
RBC: 4.25 10*6/uL (ref 3.70–5.45)
RDW: 19 % — ABNORMAL HIGH (ref 11.2–14.5)
WBC: 12.2 10*3/uL — ABNORMAL HIGH (ref 3.9–10.3)
lymph#: 1 10*3/uL (ref 0.9–3.3)
nRBC: 0 % (ref 0–0)

## 2012-05-24 LAB — COMPREHENSIVE METABOLIC PANEL (CC13)
ALT: 19 U/L (ref 0–55)
AST: 12 U/L (ref 5–34)
Albumin: 3.4 g/dL — ABNORMAL LOW (ref 3.5–5.0)
Alkaline Phosphatase: 87 U/L (ref 40–150)
BUN: 20 mg/dL (ref 7.0–26.0)
Creatinine: 0.7 mg/dL (ref 0.6–1.1)
Potassium: 3.6 mEq/L (ref 3.5–5.1)

## 2012-05-24 MED ORDER — SODIUM CHLORIDE 0.9 % IV SOLN
600.0000 mg | Freq: Once | INTRAVENOUS | Status: AC
  Administered 2012-05-24: 600 mg via INTRAVENOUS
  Filled 2012-05-24: qty 60

## 2012-05-24 MED ORDER — HEPARIN SOD (PORK) LOCK FLUSH 100 UNIT/ML IV SOLN
500.0000 [IU] | Freq: Once | INTRAVENOUS | Status: DC | PRN
  Filled 2012-05-24: qty 5

## 2012-05-24 MED ORDER — ACETAMINOPHEN 325 MG PO TABS
650.0000 mg | ORAL_TABLET | Freq: Once | ORAL | Status: AC
  Administered 2012-05-24: 650 mg via ORAL

## 2012-05-24 MED ORDER — DOCETAXEL CHEMO INJECTION 160 MG/16ML
75.0000 mg/m2 | Freq: Once | INTRAVENOUS | Status: AC
  Administered 2012-05-24: 130 mg via INTRAVENOUS
  Filled 2012-05-24: qty 13

## 2012-05-24 MED ORDER — SODIUM CHLORIDE 0.9 % IV SOLN
Freq: Once | INTRAVENOUS | Status: AC
  Administered 2012-05-24: 13:00:00 via INTRAVENOUS

## 2012-05-24 MED ORDER — SODIUM CHLORIDE 0.9 % IJ SOLN
10.0000 mL | INTRAMUSCULAR | Status: DC | PRN
  Filled 2012-05-24: qty 10

## 2012-05-24 MED ORDER — DEXAMETHASONE SODIUM PHOSPHATE 4 MG/ML IJ SOLN
20.0000 mg | Freq: Once | INTRAMUSCULAR | Status: AC
  Administered 2012-05-24: 20 mg via INTRAVENOUS

## 2012-05-24 MED ORDER — ONDANSETRON 16 MG/50ML IVPB (CHCC)
16.0000 mg | Freq: Once | INTRAVENOUS | Status: AC
  Administered 2012-05-24: 16 mg via INTRAVENOUS

## 2012-05-24 MED ORDER — DIPHENHYDRAMINE HCL 25 MG PO CAPS
50.0000 mg | ORAL_CAPSULE | Freq: Once | ORAL | Status: AC
  Administered 2012-05-24: 50 mg via ORAL

## 2012-05-24 MED ORDER — TRASTUZUMAB CHEMO INJECTION 440 MG
2.0000 mg/kg | Freq: Once | INTRAVENOUS | Status: AC
  Administered 2012-05-24: 147 mg via INTRAVENOUS
  Filled 2012-05-24: qty 7

## 2012-05-24 NOTE — Progress Notes (Signed)
Hematology and Oncology Follow Up Visit  Alicia Pena [id number] 03-Jun-1971 40 y.o. 05/24/2012 12:20 PM   DIAGNOSIS:   No diagnosis found.   PAST THERAPY:  T2N2a Her2+ breast cancer, s/p mastectomy, s/p TCH, cycle #1  Interim History:  Day 1 , cycle 2 TCH, she did well with treatment. She is still trying to work. She is complaining of a dry mouth, but no sores.  Medications: I have reviewed the patient's current medications.  Allergies:  Allergies  Allergen Reactions  . Duricef (Cefadroxil) Hives and Nausea Only    Past Medical History, Surgical history, Social history, and Family History were reviewed and updated.  Review of Systems: Constitutional:  Negative for fever, chills, night sweats, anorexia, weight loss, pain. Cardiovascular: no chest pain or dyspnea on exertion Respiratory: no cough, shortness of breath, or wheezing Neurological: no TIA or stroke symptoms Dermatological: negative ENT: negative Skin Gastrointestinal: negative Genito-Urinary: negative Hematological and Lymphatic: negative Breast: negative Musculoskeletal: negative Remaining ROS negative.  Physical Exam:  Blood pressure 141/79, pulse 108, temperature 98.3 F (36.8 C), resp. rate 20, height 5' (1.524 m), weight 156 lb 9.6 oz (71.033 kg).  ECOG: 0   HEENT:  Sclerae anicteric, conjunctivae pink.  Oropharynx clear.  No mucositis or candidiasis.  Nodes:  No cervical, supraclavicular, or axillary lymphadenopathy palpated. Port  Lungs:  Clear to auscultation bilaterally.  No crackles, rhonchi, or wheezes.  Heart:  Regular rate and rhythm.  Abdomen:  Soft, nontender.  Positive bowel sounds.  No organomegaly or masses palpated.  Musculoskeletal:  No focal spinal tenderness to palpation.  Extremities:  Benign.  No peripheral edema or cyanosis.  Skin:  Benign.  Neuro:  Nonfocal.    Lab Results: Lab Results  Component Value Date   WBC 12.2* 05/24/2012   HGB 10.1* 05/24/2012   HCT 32.6*  05/24/2012   MCV 76.7* 05/24/2012   PLT 343 05/24/2012     Chemistry      Component Value Date/Time   NA 139 05/03/2012 1044   NA 138 04/02/2012 1100   K 4.0 05/03/2012 1044   K 3.9 04/02/2012 1100   CL 111* 05/03/2012 1044   CL 103 04/02/2012 1100   CO2 20* 05/03/2012 1044   CO2 25 04/02/2012 1100   BUN 13.0 05/03/2012 1044   BUN 16 04/02/2012 1100   CREATININE 0.6 05/03/2012 1044   CREATININE 0.49* 04/02/2012 1100      Component Value Date/Time   CALCIUM 9.9 05/03/2012 1044   CALCIUM 9.0 04/02/2012 1100   ALKPHOS 81 05/03/2012 1044   AST 8 05/03/2012 1044   ALT 12 05/03/2012 1044   BILITOT 0.65 05/03/2012 1044       Radiological Studies:  Nm Pet Image Initial (pi) Skull Base To Thigh  04/09/2012  *RADIOLOGY REPORT*  Clinical Data: Initial treatment strategy for new diagnosis of breast cancer.  NUCLEAR MEDICINE PET SKULL BASE TO THIGH  Fasting Blood Glucose:  92  Technique:  17.1 mCi F-18 FDG was injected intravenously. CT data was obtained and used for attenuation correction and anatomic localization only.  (This was not acquired as a diagnostic CT examination.) Additional exam technical data entered on technologist worksheet.  Comparison:  MRI breast of 02/11/2012.  Chest film of 04/04/2012.  Findings:  Neck: No abnormal hypermetabolism.  Chest:  Low level hypermetabolism corresponds to  surgical clips and edema within the left axilla and left chest wall.  This is consistent with postoperative change.  No focal abnormality within either axilla or  the right breast.  Abdomen/Pelvis:  No abnormal hypermetabolism.  Skeleton:  No abnormal bony hypermetabolism.  There is right upper extremity hypermetabolism which is presumably the site of injection with minimal extravasation.  CT  images performed for attenuation correction demonstrate no significant findings within the neck.  A right-sided Port-A-Cath terminates at the high right atrium. Mild cardiomegaly.  Convex left lumbar spine curvature.   Hysterectomy.  Sclerotic lesion within the right femoral neck which is not significantly hypermetabolic.  This measures 1.6 cm on image 195.  IMPRESSION:  1.  Surgical changes within the left chest and axilla.  No evidence of hypermetabolic metastatic disease. 2. Not hypermetabolic sclerotic lesion in the posterior right femoral head is most likely an enchondroma or bone island.   Original Report Authenticated By: Consuello Bossier, M.D.    Dg Chest Port 1 View  04/04/2012  *RADIOLOGY REPORT*  Clinical Data: Post right-sided subclavian port placement  PORTABLE CHEST - 1 VIEW  Comparison: None.  Findings:  Borderline enlarged cardiac silhouette and mediastinal contours, likely accentuated due to decreased lung volumes.  Right subclavian vein approach port a catheter tip overlies the superior cavoatrial junction.  Left lateral chest wall/breast surgical drain.  Surgical clips overlie the left axilla.  There is mild elevation of the right hemidiaphragm with bibasilar heterogeneous opacities, right greater than left.  No definite pneumothorax.  No definite pleural effusion.  Unchanged bones.  IMPRESSION: 1.  Right subclavian vein approach port a catheter tip overlies the superior cavoatrial junction.  No pneumothorax. 2.  Left lateral chest wall/breast surgical drain with left axillary surgical clips. 3.  Decreased lung volumes and elevation of the right hemidiaphragm with bibasilar opacities, right greater than left, likely atelectasis.   Original Report Authenticated By: Waynard Reeds, M.D.    Dg Fluoro Guide Cv Line-no Report  04/04/2012  CLINICAL DATA: Port a cath   FLOURO GUIDE CV LINE  Fluoroscopy was utilized by the requesting physician.  No radiographic  interpretation.       IMPRESSIONS AND PLAN: A 41 y.o. female with   Hx of multifocal breast cancer, her2+; D1 C2 TCH, she is doing well, with minimal side effects. I will see he in 2 weeks , herceptin will continue  weekly.    [name] 11/22/201312:20 PM Cell [phone]       [name] 11/22/201312:20 PM Cell [phone]

## 2012-05-24 NOTE — Patient Instructions (Addendum)
[facility] Discharge Instructions for Patients Receiving Chemotherapy  Today you received the following chemotherapy agents Taxotere, Carboplatin and Herceptin.  To help prevent nausea and vomiting after your treatment, we encourage you to take your nausea medication as prescribed.    If you develop nausea and vomiting that is not controlled by your nausea medication, call the clinic. If it is after clinic hours your family physician or the after hours number for the clinic or go to the Emergency Department.   BELOW ARE SYMPTOMS THAT SHOULD BE REPORTED IMMEDIATELY:  *FEVER GREATER THAN 100.5 F  *CHILLS WITH OR WITHOUT FEVER  NAUSEA AND VOMITING THAT IS NOT CONTROLLED WITH YOUR NAUSEA MEDICATION  *UNUSUAL SHORTNESS OF BREATH  *UNUSUAL BRUISING OR BLEEDING  TENDERNESS IN MOUTH AND THROAT WITH OR WITHOUT PRESENCE OF ULCERS  *URINARY PROBLEMS  *BOWEL PROBLEMS  UNUSUAL RASH Items with * indicate a potential emergency and should be followed up as soon as possible.  One of the nurses will contact you 24 hours after your treatment. Please let the nurse know about any problems that you may have experienced. Feel free to call the clinic you have any questions or concerns. The clinic phone number is [phone].   I have been informed and understand all the instructions given to me. I know to contact the clinic, my physician, or go to the Emergency Department if any problems should occur. I do not have any questions at this time, but understand that I may call the clinic during office hours   should I have any questions or need assistance in obtaining follow up care.    __________________________________________  _____________  __________ Signature of Patient or Authorized Representative            Date                   Time    __________________________________________ Nurse's Signature    

## 2012-05-25 ENCOUNTER — Ambulatory Visit (HOSPITAL_BASED_OUTPATIENT_CLINIC_OR_DEPARTMENT_OTHER): Payer: PRIVATE HEALTH INSURANCE

## 2012-05-25 VITALS — BP 153/78 | HR 62 | Temp 96.7°F | Resp 18

## 2012-05-25 DIAGNOSIS — C50419 Malignant neoplasm of upper-outer quadrant of unspecified female breast: Secondary | ICD-10-CM

## 2012-05-25 DIAGNOSIS — Z5189 Encounter for other specified aftercare: Secondary | ICD-10-CM

## 2012-05-25 DIAGNOSIS — C50919 Malignant neoplasm of unspecified site of unspecified female breast: Secondary | ICD-10-CM

## 2012-05-25 MED ORDER — PEGFILGRASTIM INJECTION 6 MG/0.6ML
6.0000 mg | Freq: Once | SUBCUTANEOUS | Status: AC
  Administered 2012-05-25: 6 mg via SUBCUTANEOUS

## 2012-05-27 ENCOUNTER — Telehealth: Payer: Self-pay | Admitting: *Deleted

## 2012-05-27 ENCOUNTER — Ambulatory Visit (HOSPITAL_BASED_OUTPATIENT_CLINIC_OR_DEPARTMENT_OTHER): Payer: PRIVATE HEALTH INSURANCE | Admitting: Lab

## 2012-05-27 ENCOUNTER — Other Ambulatory Visit: Payer: Self-pay | Admitting: *Deleted

## 2012-05-27 ENCOUNTER — Telehealth: Payer: Self-pay | Admitting: Oncology

## 2012-05-27 DIAGNOSIS — C50919 Malignant neoplasm of unspecified site of unspecified female breast: Secondary | ICD-10-CM

## 2012-05-27 DIAGNOSIS — R319 Hematuria, unspecified: Secondary | ICD-10-CM

## 2012-05-27 LAB — URINALYSIS, MICROSCOPIC - CHCC
Nitrite: NEGATIVE
Protein: 30 mg/dL
Specific Gravity, Urine: 1.015 (ref 1.003–1.035)

## 2012-05-27 LAB — CBC WITH DIFFERENTIAL/PLATELET
Eosinophils Absolute: 0 10*3/uL (ref 0.0–0.5)
LYMPH%: 1.9 % — ABNORMAL LOW (ref 14.0–49.7)
MCH: 23.7 pg — ABNORMAL LOW (ref 25.1–34.0)
MCV: 76.3 fL — ABNORMAL LOW (ref 79.5–101.0)
MONO%: 0.7 % (ref 0.0–14.0)
NEUT#: 16.2 10*3/uL — ABNORMAL HIGH (ref 1.5–6.5)
Platelets: 200 10*3/uL (ref 145–400)
RBC: 4.35 10*6/uL (ref 3.70–5.45)
nRBC: 0 % (ref 0–0)

## 2012-05-27 NOTE — Telephone Encounter (Signed)
Received call from patient stating she has had bright red blood in her urine since Saturday.  Denies any other symptoms.  Instructed patient to come in for CBC and urinalysis.  Appt made and patient aware.

## 2012-05-27 NOTE — Telephone Encounter (Signed)
Pt is aware of her lab appt for today@1 :00pm

## 2012-05-28 ENCOUNTER — Other Ambulatory Visit: Payer: Self-pay | Admitting: Certified Registered Nurse Anesthetist

## 2012-05-28 ENCOUNTER — Other Ambulatory Visit: Payer: Self-pay | Admitting: *Deleted

## 2012-05-28 DIAGNOSIS — C50419 Malignant neoplasm of upper-outer quadrant of unspecified female breast: Secondary | ICD-10-CM

## 2012-05-31 ENCOUNTER — Ambulatory Visit (HOSPITAL_BASED_OUTPATIENT_CLINIC_OR_DEPARTMENT_OTHER): Payer: PRIVATE HEALTH INSURANCE

## 2012-05-31 ENCOUNTER — Other Ambulatory Visit (HOSPITAL_BASED_OUTPATIENT_CLINIC_OR_DEPARTMENT_OTHER): Payer: PRIVATE HEALTH INSURANCE | Admitting: Lab

## 2012-05-31 ENCOUNTER — Encounter: Payer: Self-pay | Admitting: Oncology

## 2012-05-31 ENCOUNTER — Ambulatory Visit (HOSPITAL_BASED_OUTPATIENT_CLINIC_OR_DEPARTMENT_OTHER): Payer: PRIVATE HEALTH INSURANCE | Admitting: Oncology

## 2012-05-31 VITALS — BP 116/83 | HR 108 | Temp 97.9°F | Resp 20 | Ht 60.0 in | Wt 156.5 lb

## 2012-05-31 DIAGNOSIS — C50419 Malignant neoplasm of upper-outer quadrant of unspecified female breast: Secondary | ICD-10-CM

## 2012-05-31 DIAGNOSIS — Z170 Estrogen receptor positive status [ER+]: Secondary | ICD-10-CM

## 2012-05-31 DIAGNOSIS — C50919 Malignant neoplasm of unspecified site of unspecified female breast: Secondary | ICD-10-CM

## 2012-05-31 DIAGNOSIS — Z5112 Encounter for antineoplastic immunotherapy: Secondary | ICD-10-CM

## 2012-05-31 LAB — CBC WITH DIFFERENTIAL/PLATELET
Basophils Absolute: 0 10*3/uL (ref 0.0–0.1)
Eosinophils Absolute: 0 10*3/uL (ref 0.0–0.5)
HGB: 10.6 g/dL — ABNORMAL LOW (ref 11.6–15.9)
LYMPH%: 28.4 % (ref 14.0–49.7)
MCV: 74.9 fL — ABNORMAL LOW (ref 79.5–101.0)
MONO%: 13.9 % (ref 0.0–14.0)
NEUT#: 3.9 10*3/uL (ref 1.5–6.5)
Platelets: 94 10*3/uL — ABNORMAL LOW (ref 145–400)

## 2012-05-31 MED ORDER — DIPHENHYDRAMINE HCL 25 MG PO CAPS
50.0000 mg | ORAL_CAPSULE | Freq: Once | ORAL | Status: AC
  Administered 2012-05-31: 50 mg via ORAL

## 2012-05-31 MED ORDER — SODIUM CHLORIDE 0.9 % IV SOLN
Freq: Once | INTRAVENOUS | Status: AC
  Administered 2012-05-31: 12:00:00 via INTRAVENOUS

## 2012-05-31 MED ORDER — TRASTUZUMAB CHEMO INJECTION 440 MG
2.0000 mg/kg | Freq: Once | INTRAVENOUS | Status: AC
  Administered 2012-05-31: 147 mg via INTRAVENOUS
  Filled 2012-05-31: qty 7

## 2012-05-31 MED ORDER — ACETAMINOPHEN 325 MG PO TABS
650.0000 mg | ORAL_TABLET | Freq: Once | ORAL | Status: AC
  Administered 2012-05-31: 650 mg via ORAL

## 2012-05-31 MED ORDER — SODIUM CHLORIDE 0.9 % IJ SOLN
10.0000 mL | INTRAMUSCULAR | Status: DC | PRN
  Administered 2012-05-31: 10 mL
  Filled 2012-05-31: qty 10

## 2012-05-31 MED ORDER — HEPARIN SOD (PORK) LOCK FLUSH 100 UNIT/ML IV SOLN
500.0000 [IU] | Freq: Once | INTRAVENOUS | Status: AC | PRN
  Administered 2012-05-31: 500 [IU]
  Filled 2012-05-31: qty 5

## 2012-05-31 NOTE — Progress Notes (Signed)
Enrolled pt in the [employer] First Step program.  I faxed signed form and activated card today.  °

## 2012-05-31 NOTE — Progress Notes (Signed)
Hematology and Oncology Follow Up Visit  Alicia Pena [id number] [birth year] 41 y.o. 05/31/2012 12:02 PM   DIAGNOSIS:   No diagnosis found.   PAST THERAPY:  T2N2a Her2+ breast cancer, s/p mastectomy, s/p TCH, cycle #1  Interim History:  Day 7 , cycle 2 TCH, she did well with treatment. She is still trying to work. She is complaining of a dry mouth, but no sores.  Medications: I have reviewed the patient's current medications.  Allergies:  Allergies  Allergen Reactions  . Duricef (Cefadroxil) Hives and Nausea Only    Past Medical History, Surgical history, Social history, and Family History were reviewed and updated.  Review of Systems: Constitutional:  Negative for fever, chills, night sweats, anorexia, weight loss, pain. Cardiovascular: no chest pain or dyspnea on exertion Respiratory: no cough, shortness of breath, or wheezing Neurological: no TIA or stroke symptoms Dermatological: negative ENT: negative Skin Gastrointestinal: negative Genito-Urinary: negative Hematological and Lymphatic: negative Breast: negative Musculoskeletal: negative Remaining ROS negative.  Physical Exam:  Blood pressure 116/83, pulse 108, temperature 97.9 F (36.6 C), temperature source Oral, resp. rate 20, height 5' (1.524 m), weight 156 lb 8 oz (70.988 kg).  ECOG: 0   HEENT:  Sclerae anicteric, conjunctivae pink.  Oropharynx clear.  No mucositis or candidiasis.  Nodes:  No cervical, supraclavicular, or axillary lymphadenopathy palpated. Port  Lungs:  Clear to auscultation bilaterally.  No crackles, rhonchi, or wheezes.  Heart:  Regular rate and rhythm.  Abdomen:  Soft, nontender.  Positive bowel sounds.  No organomegaly or masses palpated.  Musculoskeletal:  No focal spinal tenderness to palpation.  Extremities:  Benign.  No peripheral edema or cyanosis.  Skin:  Benign.  Neuro:  Nonfocal.    Lab Results: Lab Results  Component Value Date   WBC 6.7 05/31/2012   HGB 10.6* 05/31/2012     HCT 33.2* 05/31/2012   MCV 74.9* 05/31/2012   PLT 94* 05/31/2012     Chemistry      Component Value Date/Time   NA 142 05/24/2012 1056   NA 138 04/02/2012 1100   K 3.6 05/24/2012 1056   K 3.9 04/02/2012 1100   CL 112* 05/24/2012 1056   CL 103 04/02/2012 1100   CO2 26 05/24/2012 1056   CO2 25 04/02/2012 1100   BUN 20.0 05/24/2012 1056   BUN 16 04/02/2012 1100   CREATININE 0.7 05/24/2012 1056   CREATININE 0.49* 04/02/2012 1100      Component Value Date/Time   CALCIUM 9.3 05/24/2012 1056   CALCIUM 9.0 04/02/2012 1100   ALKPHOS 87 05/24/2012 1056   AST 12 05/24/2012 1056   ALT 19 05/24/2012 1056   BILITOT 0.61 05/24/2012 1056       Radiological Studies:  Nm Pet Image Initial (pi) Skull Base To Thigh  04/09/2012  *RADIOLOGY REPORT*  Clinical Data: Initial treatment strategy for new diagnosis of breast cancer.  NUCLEAR MEDICINE PET SKULL BASE TO THIGH  Fasting Blood Glucose:  92  Technique:  17.1 mCi F-18 FDG was injected intravenously. CT data was obtained and used for attenuation correction and anatomic localization only.  (This was not acquired as a diagnostic CT examination.) Additional exam technical data entered on technologist worksheet.  Comparison:  MRI breast of 02/11/2012.  Chest film of 04/04/2012.  Findings:  Neck: No abnormal hypermetabolism.  Chest:  Low level hypermetabolism corresponds to  surgical clips and edema within the left axilla and left chest wall.  This is consistent with postoperative change.  No focal abnormality  within either axilla or the right breast.  Abdomen/Pelvis:  No abnormal hypermetabolism.  Skeleton:  No abnormal bony hypermetabolism.  There is right upper extremity hypermetabolism which is presumably the site of injection with minimal extravasation.  CT  images performed for attenuation correction demonstrate no significant findings within the neck.  A right-sided Port-A-Cath terminates at the high right atrium. Mild cardiomegaly.  Convex left lumbar  spine curvature.  Hysterectomy.  Sclerotic lesion within the right femoral neck which is not significantly hypermetabolic.  This measures 1.6 cm on image 195.  IMPRESSION:  1.  Surgical changes within the left chest and axilla.  No evidence of hypermetabolic metastatic disease. 2. Not hypermetabolic sclerotic lesion in the posterior right femoral head is most likely an enchondroma or bone island.   Original Report Authenticated By: Consuello Bossier, M.D.    Dg Chest Port 1 View  04/04/2012  *RADIOLOGY REPORT*  Clinical Data: Post right-sided subclavian port placement  PORTABLE CHEST - 1 VIEW  Comparison: None.  Findings:  Borderline enlarged cardiac silhouette and mediastinal contours, likely accentuated due to decreased lung volumes.  Right subclavian vein approach port a catheter tip overlies the superior cavoatrial junction.  Left lateral chest wall/breast surgical drain.  Surgical clips overlie the left axilla.  There is mild elevation of the right hemidiaphragm with bibasilar heterogeneous opacities, right greater than left.  No definite pneumothorax.  No definite pleural effusion.  Unchanged bones.  IMPRESSION: 1.  Right subclavian vein approach port a catheter tip overlies the superior cavoatrial junction.  No pneumothorax. 2.  Left lateral chest wall/breast surgical drain with left axillary surgical clips. 3.  Decreased lung volumes and elevation of the right hemidiaphragm with bibasilar opacities, right greater than left, likely atelectasis.   Original Report Authenticated By: Waynard Reeds, M.D.    Dg Fluoro Guide Cv Line-no Report  04/04/2012  CLINICAL DATA: Port a cath   FLOURO GUIDE CV LINE  Fluoroscopy was utilized by the requesting physician.  No radiographic  interpretation.       IMPRESSIONS AND PLAN: A 41 y.o. female with   Hx of multifocal breast cancer, her2+; D7 C2 TCH, she is doing well, with minimal side effects. I will see he in 2 weeks , herceptin will continue  weekly.    Ladainian Therien 11/29/201312:02 PM Cell [phone]       Delaine Canter 11/29/201312:02 PM Cell [phone]

## 2012-05-31 NOTE — Patient Instructions (Signed)
[facility] Discharge Instructions for Patients Receiving Chemotherapy  Today you received the following chemotherapy agents Herceptin  To help prevent nausea and vomiting after your treatment, we encourage you to take your nausea medication as prescribed.   If you develop nausea and vomiting that is not controlled by your nausea medication, call the clinic. If it is after clinic hours your family physician or the after hours number for the clinic or go to the Emergency Department.   BELOW ARE SYMPTOMS THAT SHOULD BE REPORTED IMMEDIATELY:  *FEVER GREATER THAN 100.5 F  *CHILLS WITH OR WITHOUT FEVER  NAUSEA AND VOMITING THAT IS NOT CONTROLLED WITH YOUR NAUSEA MEDICATION  *UNUSUAL SHORTNESS OF BREATH  *UNUSUAL BRUISING OR BLEEDING  TENDERNESS IN MOUTH AND THROAT WITH OR WITHOUT PRESENCE OF ULCERS  *URINARY PROBLEMS  *BOWEL PROBLEMS  UNUSUAL RASH Items with * indicate a potential emergency and should be followed up as soon as possible.  Feel free to call the clinic you have any questions or concerns. The clinic phone number is [phone].   I have been informed and understand all the instructions given to me. I know to contact the clinic, my physician, or go to the Emergency Department if any problems should occur. I do not have any questions at this time, but understand that I may call the clinic during office hours   should I have any questions or need assistance in obtaining follow up care.   

## 2012-06-03 ENCOUNTER — Ambulatory Visit: Payer: PRIVATE HEALTH INSURANCE | Attending: General Surgery

## 2012-06-03 DIAGNOSIS — IMO0001 Reserved for inherently not codable concepts without codable children: Secondary | ICD-10-CM | POA: Insufficient documentation

## 2012-06-03 DIAGNOSIS — M24519 Contracture, unspecified shoulder: Secondary | ICD-10-CM | POA: Insufficient documentation

## 2012-06-03 DIAGNOSIS — M25519 Pain in unspecified shoulder: Secondary | ICD-10-CM | POA: Insufficient documentation

## 2012-06-05 ENCOUNTER — Ambulatory Visit: Payer: PRIVATE HEALTH INSURANCE | Admitting: Physical Therapy

## 2012-06-06 ENCOUNTER — Other Ambulatory Visit: Payer: Self-pay | Admitting: *Deleted

## 2012-06-06 ENCOUNTER — Encounter: Payer: Self-pay | Admitting: *Deleted

## 2012-06-06 DIAGNOSIS — C50419 Malignant neoplasm of upper-outer quadrant of unspecified female breast: Secondary | ICD-10-CM

## 2012-06-07 ENCOUNTER — Telehealth: Payer: Self-pay | Admitting: Oncology

## 2012-06-07 ENCOUNTER — Other Ambulatory Visit: Payer: PRIVATE HEALTH INSURANCE | Admitting: Lab

## 2012-06-07 ENCOUNTER — Telehealth: Payer: Self-pay | Admitting: *Deleted

## 2012-06-07 ENCOUNTER — Ambulatory Visit (HOSPITAL_BASED_OUTPATIENT_CLINIC_OR_DEPARTMENT_OTHER): Payer: PRIVATE HEALTH INSURANCE | Admitting: Oncology

## 2012-06-07 ENCOUNTER — Ambulatory Visit (HOSPITAL_BASED_OUTPATIENT_CLINIC_OR_DEPARTMENT_OTHER): Payer: PRIVATE HEALTH INSURANCE

## 2012-06-07 VITALS — BP 137/75 | HR 97 | Temp 97.9°F | Resp 20 | Ht 60.0 in | Wt 159.0 lb

## 2012-06-07 DIAGNOSIS — C50919 Malignant neoplasm of unspecified site of unspecified female breast: Secondary | ICD-10-CM

## 2012-06-07 DIAGNOSIS — C50419 Malignant neoplasm of upper-outer quadrant of unspecified female breast: Secondary | ICD-10-CM

## 2012-06-07 DIAGNOSIS — Z5112 Encounter for antineoplastic immunotherapy: Secondary | ICD-10-CM

## 2012-06-07 LAB — CBC WITH DIFFERENTIAL/PLATELET
Basophils Absolute: 0 10*3/uL (ref 0.0–0.1)
Eosinophils Absolute: 0 10*3/uL (ref 0.0–0.5)
HGB: 9.6 g/dL — ABNORMAL LOW (ref 11.6–15.9)
MCV: 78 fL — ABNORMAL LOW (ref 79.5–101.0)
MONO#: 0.4 10*3/uL (ref 0.1–0.9)
MONO%: 5.3 % (ref 0.0–14.0)
NEUT#: 6.1 10*3/uL (ref 1.5–6.5)
RDW: 21 % — ABNORMAL HIGH (ref 11.2–14.5)
WBC: 8.2 10*3/uL (ref 3.9–10.3)
lymph#: 1.6 10*3/uL (ref 0.9–3.3)

## 2012-06-07 MED ORDER — TRASTUZUMAB CHEMO INJECTION 440 MG
2.0000 mg/kg | Freq: Once | INTRAVENOUS | Status: AC
  Administered 2012-06-07: 147 mg via INTRAVENOUS
  Filled 2012-06-07: qty 7

## 2012-06-07 MED ORDER — SODIUM CHLORIDE 0.9 % IV SOLN
Freq: Once | INTRAVENOUS | Status: AC
  Administered 2012-06-07: 13:00:00 via INTRAVENOUS

## 2012-06-07 MED ORDER — HEPARIN SOD (PORK) LOCK FLUSH 100 UNIT/ML IV SOLN
500.0000 [IU] | Freq: Once | INTRAVENOUS | Status: DC | PRN
  Filled 2012-06-07: qty 5

## 2012-06-07 MED ORDER — DIPHENHYDRAMINE HCL 25 MG PO CAPS
50.0000 mg | ORAL_CAPSULE | Freq: Once | ORAL | Status: AC
  Administered 2012-06-07: 50 mg via ORAL

## 2012-06-07 MED ORDER — SODIUM CHLORIDE 0.9 % IJ SOLN
10.0000 mL | INTRAMUSCULAR | Status: DC | PRN
  Filled 2012-06-07: qty 10

## 2012-06-07 MED ORDER — ACETAMINOPHEN 325 MG PO TABS
650.0000 mg | ORAL_TABLET | Freq: Once | ORAL | Status: AC
  Administered 2012-06-07: 650 mg via ORAL

## 2012-06-07 NOTE — Progress Notes (Signed)
Hematology and Oncology Follow Up Visit  Alicia Pena [id number] May 18, 1971 40 y.o. 06/07/2012 12:55 PM   DIAGNOSIS:   No diagnosis found.   PAST THERAPY:  T2N2a Her2+ breast cancer, s/p mastectomy, s/p TCH, cycle #1  Interim History:  Day 1 cycle l2 TCH, she did well with treatment. She is still trying to work. She is complaining of a dry mouth, but no sores. She has not had any more hematuria.  Medications: I have reviewed the patient's current medications.  Allergies:  Allergies  Allergen Reactions  . Duricef (Cefadroxil) Hives and Nausea Only    Past Medical History, Surgical history, Social history, and Family History were reviewed and updated.  Review of Systems: Constitutional:  Negative for fever, chills, night sweats, anorexia, weight loss, pain. Cardiovascular: no chest pain or dyspnea on exertion Respiratory: no cough, shortness of breath, or wheezing Neurological: no TIA or stroke symptoms Dermatological: negative ENT: negative Skin Gastrointestinal: negative Genito-Urinary: negative Hematological and Lymphatic: negative Breast: negative Musculoskeletal: negative Remaining ROS negative.  Physical Exam:  Blood pressure 137/75, pulse 97, temperature 97.9 F (36.6 C), resp. rate 20, height 5' (1.524 m), weight 159 lb (72.122 kg).  ECOG: 0   HEENT:  Sclerae anicteric, conjunctivae pink.  Oropharynx clear.  No mucositis or candidiasis.  Nodes:  No cervical, supraclavicular, or axillary lymphadenopathy palpated. Port  Lungs:  Clear to auscultation bilaterally.  No crackles, rhonchi, or wheezes.  Heart:  Regular rate and rhythm.  Abdomen:  Soft, nontender.  Positive bowel sounds.  No organomegaly or masses palpated.  Musculoskeletal:  No focal spinal tenderness to palpation.  Extremities:  Benign.  No peripheral edema or cyanosis.  Skin:  Benign.  Neuro:  Nonfocal.    Lab Results: Lab Results  Component Value Date   WBC 8.2 06/07/2012   HGB 9.6* 06/07/2012    HCT 31.2* 06/07/2012   MCV 78.0* 06/07/2012   PLT 137* 06/07/2012     Chemistry      Component Value Date/Time   NA 142 05/24/2012 1056   NA 138 04/02/2012 1100   K 3.6 05/24/2012 1056   K 3.9 04/02/2012 1100   CL 112* 05/24/2012 1056   CL 103 04/02/2012 1100   CO2 26 05/24/2012 1056   CO2 25 04/02/2012 1100   BUN 20.0 05/24/2012 1056   BUN 16 04/02/2012 1100   CREATININE 0.7 05/24/2012 1056   CREATININE 0.49* 04/02/2012 1100      Component Value Date/Time   CALCIUM 9.3 05/24/2012 1056   CALCIUM 9.0 04/02/2012 1100   ALKPHOS 87 05/24/2012 1056   AST 12 05/24/2012 1056   ALT 19 05/24/2012 1056   BILITOT 0.61 05/24/2012 1056       Radiological Studies:  Nm Pet Image Initial (pi) Skull Base To Thigh  04/09/2012  *RADIOLOGY REPORT*  Clinical Data: Initial treatment strategy for new diagnosis of breast cancer.  NUCLEAR MEDICINE PET SKULL BASE TO THIGH  Fasting Blood Glucose:  92  Technique:  17.1 mCi F-18 FDG was injected intravenously. CT data was obtained and used for attenuation correction and anatomic localization only.  (This was not acquired as a diagnostic CT examination.) Additional exam technical data entered on technologist worksheet.  Comparison:  MRI breast of 02/11/2012.  Chest film of 04/04/2012.  Findings:  Neck: No abnormal hypermetabolism.  Chest:  Low level hypermetabolism corresponds to  surgical clips and edema within the left axilla and left chest wall.  This is consistent with postoperative change.  No focal abnormality  within either axilla or the right breast.  Abdomen/Pelvis:  No abnormal hypermetabolism.  Skeleton:  No abnormal bony hypermetabolism.  There is right upper extremity hypermetabolism which is presumably the site of injection with minimal extravasation.  CT  images performed for attenuation correction demonstrate no significant findings within the neck.  A right-sided Port-A-Cath terminates at the high right atrium. Mild cardiomegaly.  Convex left lumbar  spine curvature.  Hysterectomy.  Sclerotic lesion within the right femoral neck which is not significantly hypermetabolic.  This measures 1.6 cm on image 195.  IMPRESSION:  1.  Surgical changes within the left chest and axilla.  No evidence of hypermetabolic metastatic disease. 2. Not hypermetabolic sclerotic lesion in the posterior right femoral head is most likely an enchondroma or bone island.   Original Report Authenticated By: Consuello Bossier, M.D.    Dg Chest Port 1 View  04/04/2012  *RADIOLOGY REPORT*  Clinical Data: Post right-sided subclavian port placement  PORTABLE CHEST - 1 VIEW  Comparison: None.  Findings:  Borderline enlarged cardiac silhouette and mediastinal contours, likely accentuated due to decreased lung volumes.  Right subclavian vein approach port a catheter tip overlies the superior cavoatrial junction.  Left lateral chest wall/breast surgical drain.  Surgical clips overlie the left axilla.  There is mild elevation of the right hemidiaphragm with bibasilar heterogeneous opacities, right greater than left.  No definite pneumothorax.  No definite pleural effusion.  Unchanged bones.  IMPRESSION: 1.  Right subclavian vein approach port a catheter tip overlies the superior cavoatrial junction.  No pneumothorax. 2.  Left lateral chest wall/breast surgical drain with left axillary surgical clips. 3.  Decreased lung volumes and elevation of the right hemidiaphragm with bibasilar opacities, right greater than left, likely atelectasis.   Original Report Authenticated By: Waynard Reeds, M.D.    Dg Fluoro Guide Cv Line-no Report  04/04/2012  CLINICAL DATA: Port a cath   FLOURO GUIDE CV LINE  Fluoroscopy was utilized by the requesting physician.  No radiographic  interpretation.       IMPRESSIONS AND PLAN: A 41 y.o. female with   Hx of multifocal breast cancer, her2+; D1 C3 TCH, she is doing well, with minimal side effects. I will see he in 1 weeks , herceptin will continue weekly.she will  return in 1 week for herceptin.    Alicia Pena 12/6/201312:55 PM Cell [phone]       Alicia Pena 12/6/201312:55 PM Cell [phone]

## 2012-06-07 NOTE — Telephone Encounter (Signed)
Per staff message and POf I have scheduled appts.  JWM  

## 2012-06-07 NOTE — Telephone Encounter (Signed)
The pt is aware to pick up her schedule the next time she comes in for her appt. Sent michelle a staff message to add the pt's chemo appts

## 2012-06-07 NOTE — Patient Instructions (Addendum)
[facility] Discharge Instructions for Patients Receiving Chemotherapy  Today you received the following chemotherapy agents herceptin  To help prevent nausea and vomiting after your treatment, we encourage you to take your nausea medication  and take it as often as prescribedIf you develop nausea and vomiting that is not controlled by your nausea medication, call the clinic. If it is after clinic hours your family physician or the after hours number for the clinic or go to the Emergency Department.   BELOW ARE SYMPTOMS THAT SHOULD BE REPORTED IMMEDIATELY:  *FEVER GREATER THAN 100.5 F  *CHILLS WITH OR WITHOUT FEVER  NAUSEA AND VOMITING THAT IS NOT CONTROLLED WITH YOUR NAUSEA MEDICATION  *UNUSUAL SHORTNESS OF BREATH  *UNUSUAL BRUISING OR BLEEDING  TENDERNESS IN MOUTH AND THROAT WITH OR WITHOUT PRESENCE OF ULCERS  *URINARY PROBLEMS  *BOWEL PROBLEMS  UNUSUAL RASH Items with * indicate a potential emergency and should be followed up as soon as possible.  One of the nurses will contact you 24 hours after your treatment. Please let the nurse know about any problems that you may have experienced. Feel free to call the clinic you have any questions or concerns. The clinic phone number is [phone].   I have been informed and understand all the instructions given to me. I know to contact the clinic, my physician, or go to the Emergency Department if any problems should occur. I do not have any questions at this time, but understand that I may call the clinic during office hours   should I have any questions or need assistance in obtaining follow up care.    __________________________________________  _____________  __________ Signature of Patient or Authorized Representative            Date                   Time    __________________________________________ Nurse's Signature    

## 2012-06-10 ENCOUNTER — Other Ambulatory Visit: Payer: PRIVATE HEALTH INSURANCE | Admitting: Lab

## 2012-06-10 ENCOUNTER — Ambulatory Visit: Payer: PRIVATE HEALTH INSURANCE | Admitting: Physical Therapy

## 2012-06-10 ENCOUNTER — Ambulatory Visit: Payer: PRIVATE HEALTH INSURANCE | Admitting: Oncology

## 2012-06-13 ENCOUNTER — Other Ambulatory Visit: Payer: Self-pay | Admitting: *Deleted

## 2012-06-13 DIAGNOSIS — C50419 Malignant neoplasm of upper-outer quadrant of unspecified female breast: Secondary | ICD-10-CM

## 2012-06-14 ENCOUNTER — Other Ambulatory Visit: Payer: PRIVATE HEALTH INSURANCE | Admitting: Lab

## 2012-06-14 ENCOUNTER — Ambulatory Visit (HOSPITAL_BASED_OUTPATIENT_CLINIC_OR_DEPARTMENT_OTHER): Payer: PRIVATE HEALTH INSURANCE | Admitting: Adult Health

## 2012-06-14 ENCOUNTER — Encounter: Payer: Self-pay | Admitting: Adult Health

## 2012-06-14 ENCOUNTER — Other Ambulatory Visit (HOSPITAL_BASED_OUTPATIENT_CLINIC_OR_DEPARTMENT_OTHER): Payer: PRIVATE HEALTH INSURANCE | Admitting: Lab

## 2012-06-14 ENCOUNTER — Ambulatory Visit (HOSPITAL_BASED_OUTPATIENT_CLINIC_OR_DEPARTMENT_OTHER): Payer: PRIVATE HEALTH INSURANCE

## 2012-06-14 ENCOUNTER — Ambulatory Visit: Payer: PRIVATE HEALTH INSURANCE | Admitting: Oncology

## 2012-06-14 VITALS — BP 134/85 | HR 84 | Temp 98.3°F | Resp 20 | Ht 60.0 in | Wt 159.7 lb

## 2012-06-14 DIAGNOSIS — C50919 Malignant neoplasm of unspecified site of unspecified female breast: Secondary | ICD-10-CM

## 2012-06-14 DIAGNOSIS — C50419 Malignant neoplasm of upper-outer quadrant of unspecified female breast: Secondary | ICD-10-CM

## 2012-06-14 DIAGNOSIS — Z5112 Encounter for antineoplastic immunotherapy: Secondary | ICD-10-CM

## 2012-06-14 DIAGNOSIS — Z5111 Encounter for antineoplastic chemotherapy: Secondary | ICD-10-CM

## 2012-06-14 LAB — CBC WITH DIFFERENTIAL/PLATELET
BASO%: 0.1 % (ref 0.0–2.0)
Basophils Absolute: 0 10*3/uL (ref 0.0–0.1)
HCT: 30.4 % — ABNORMAL LOW (ref 34.8–46.6)
HGB: 9.6 g/dL — ABNORMAL LOW (ref 11.6–15.9)
MONO#: 0.9 10*3/uL (ref 0.1–0.9)
NEUT#: 8.2 10*3/uL — ABNORMAL HIGH (ref 1.5–6.5)
NEUT%: 77.3 % — ABNORMAL HIGH (ref 38.4–76.8)
WBC: 10.6 10*3/uL — ABNORMAL HIGH (ref 3.9–10.3)
lymph#: 1.5 10*3/uL (ref 0.9–3.3)

## 2012-06-14 LAB — COMPREHENSIVE METABOLIC PANEL (CC13)
AST: 16 U/L (ref 5–34)
Albumin: 3.2 g/dL — ABNORMAL LOW (ref 3.5–5.0)
BUN: 23 mg/dL (ref 7.0–26.0)
Calcium: 8.9 mg/dL (ref 8.4–10.4)
Chloride: 110 mEq/L — ABNORMAL HIGH (ref 98–107)
Potassium: 3.9 mEq/L (ref 3.5–5.1)

## 2012-06-14 MED ORDER — DOCETAXEL CHEMO INJECTION 160 MG/16ML
75.0000 mg/m2 | Freq: Once | INTRAVENOUS | Status: AC
  Administered 2012-06-14: 130 mg via INTRAVENOUS
  Filled 2012-06-14: qty 13

## 2012-06-14 MED ORDER — HEPARIN SOD (PORK) LOCK FLUSH 100 UNIT/ML IV SOLN
500.0000 [IU] | Freq: Once | INTRAVENOUS | Status: AC | PRN
  Administered 2012-06-14: 500 [IU]
  Filled 2012-06-14: qty 5

## 2012-06-14 MED ORDER — SODIUM CHLORIDE 0.9 % IJ SOLN
10.0000 mL | INTRAMUSCULAR | Status: DC | PRN
  Administered 2012-06-14: 10 mL
  Filled 2012-06-14: qty 10

## 2012-06-14 MED ORDER — DIPHENHYDRAMINE HCL 25 MG PO CAPS
50.0000 mg | ORAL_CAPSULE | Freq: Once | ORAL | Status: AC
  Administered 2012-06-14: 50 mg via ORAL

## 2012-06-14 MED ORDER — ONDANSETRON 16 MG/50ML IVPB (CHCC)
16.0000 mg | Freq: Once | INTRAVENOUS | Status: AC
  Administered 2012-06-14: 16 mg via INTRAVENOUS

## 2012-06-14 MED ORDER — TRASTUZUMAB CHEMO INJECTION 440 MG
2.0000 mg/kg | Freq: Once | INTRAVENOUS | Status: AC
  Administered 2012-06-14: 147 mg via INTRAVENOUS
  Filled 2012-06-14: qty 7

## 2012-06-14 MED ORDER — DEXAMETHASONE SODIUM PHOSPHATE 4 MG/ML IJ SOLN
20.0000 mg | Freq: Once | INTRAMUSCULAR | Status: AC
  Administered 2012-06-14: 20 mg via INTRAVENOUS

## 2012-06-14 MED ORDER — SODIUM CHLORIDE 0.9 % IV SOLN
Freq: Once | INTRAVENOUS | Status: AC
  Administered 2012-06-14: 15:00:00 via INTRAVENOUS

## 2012-06-14 MED ORDER — SODIUM CHLORIDE 0.9 % IV SOLN
600.0000 mg | Freq: Once | INTRAVENOUS | Status: AC
  Administered 2012-06-14: 600 mg via INTRAVENOUS
  Filled 2012-06-14: qty 60

## 2012-06-14 MED ORDER — ACETAMINOPHEN 325 MG PO TABS
650.0000 mg | ORAL_TABLET | Freq: Once | ORAL | Status: AC
  Administered 2012-06-14: 650 mg via ORAL

## 2012-06-14 NOTE — Progress Notes (Signed)
Hematology and Oncology Follow Up Visit  Alicia Pena [id number] 05/24/1971 40 y.o. 06/14/2012 3:41 PM   DIAGNOSIS:   Encounter Diagnosis  Name Primary?  . Cancer of upper-outer quadrant of female breast Yes     PAST THERAPY:  T2N2a Her2+ breast cancer, s/p mastectomy, s/p TCH, cycle #1  Interim History: C3 day 1 of TCH with Neulasta support.  Ms. Alicia Pena is doing well today. She's ready for her third cycle of chemotehrapy.  She usually experiences epistaxis and a sore mouth the week of chemotherapy and would like suggestions of how best to deal with this.  Otherwise she's feeling well and without any questions or concerns.    Medications: I have reviewed the patient's current medications.  Allergies:  Allergies  Allergen Reactions  . Duricef (Cefadroxil) Hives and Nausea Only    Past Medical History, Surgical history, Social history, and Family History were reviewed and updated.  Review of Systems: General: fatigue (-), night sweats (-), fever (-), pain (-) Lymph: palpable nodes (-) HEENT: vision changes (-), mucositis (-), gum bleeding (-), epistaxis (-) Cardiovascular: chest pain (-), palpitations (-) Pulmonary: shortness of breath (-), dyspnea on exertion (-), cough (-), hemoptysis (-) GI:  Early satiety (-), melena (-), dysphagia (-), nausea/vomiting (-), diarrhea (-) GU: dysuria (-), hematuria (-), incontinence (-) Musculoskeletal: joint swelling (-), joint pain (-), back pain (-) Neuro: weakness (-), numbness (-), headache (-), confusion (-) Skin: Rash (-), lesions (-), dryness (-) Psych: depression (-), suicidal/homicidal ideation (-), feeling of hopelessness (-)   Physical Exam: There were no vitals taken for this visit. General: Patient is a well appearing female in no acute distress HEENT: PERRLA, sclerae anicteric no conjunctival pallor, MMM Neck: supple, no palpable adenopathy Lungs: clear to auscultation bilaterally, no wheezes, rhonchi, or  rales Cardiovascular: regular rate rhythm, S1, S2, no murmurs, rubs or gallops Abdomen: Soft, non-tender, non-distended, normoactive bowel sounds, no HSM Extremities: warm and well perfused, no clubbing, cyanosis, or edema Skin: No rashes or lesions Neuro: Non-focal ECOG: 0  Lab Results: Lab Results  Component Value Date   WBC 10.6* 06/14/2012   HGB 9.6* 06/14/2012   HCT 30.4* 06/14/2012   MCV 78.8* 06/14/2012   PLT 384 06/14/2012     Chemistry      Component Value Date/Time   NA 142 05/24/2012 1056   NA 138 04/02/2012 1100   K 3.6 05/24/2012 1056   K 3.9 04/02/2012 1100   CL 112* 05/24/2012 1056   CL 103 04/02/2012 1100   CO2 26 05/24/2012 1056   CO2 25 04/02/2012 1100   BUN 20.0 05/24/2012 1056   BUN 16 04/02/2012 1100   CREATININE 0.7 05/24/2012 1056   CREATININE 0.49* 04/02/2012 1100      Component Value Date/Time   CALCIUM 9.3 05/24/2012 1056   CALCIUM 9.0 04/02/2012 1100   ALKPHOS 87 05/24/2012 1056   AST 12 05/24/2012 1056   ALT 19 05/24/2012 1056   BILITOT 0.61 05/24/2012 1056       Radiological Studies:  Nm Pet Image Initial (pi) Skull Base To Thigh  04/09/2012  *RADIOLOGY REPORT*  Clinical Data: Initial treatment strategy for new diagnosis of breast cancer.  NUCLEAR MEDICINE PET SKULL BASE TO THIGH  Fasting Blood Glucose:  92  Technique:  17.1 mCi F-18 FDG was injected intravenously. CT data was obtained and used for attenuation correction and anatomic localization only.  (This was not acquired as a diagnostic CT examination.) Additional exam technical data entered on technologist worksheet.  Comparison:  MRI breast of 02/11/2012.  Chest film of 04/04/2012.  Findings:  Neck: No abnormal hypermetabolism.  Chest:  Low level hypermetabolism corresponds to  surgical clips and edema within the left axilla and left chest wall.  This is consistent with postoperative change.  No focal abnormality within either axilla or the right breast.  Abdomen/Pelvis:  No abnormal  hypermetabolism.  Skeleton:  No abnormal bony hypermetabolism.  There is right upper extremity hypermetabolism which is presumably the site of injection with minimal extravasation.  CT  images performed for attenuation correction demonstrate no significant findings within the neck.  A right-sided Port-A-Cath terminates at the high right atrium. Mild cardiomegaly.  Convex left lumbar spine curvature.  Hysterectomy.  Sclerotic lesion within the right femoral neck which is not significantly hypermetabolic.  This measures 1.6 cm on image 195.  IMPRESSION:  1.  Surgical changes within the left chest and axilla.  No evidence of hypermetabolic metastatic disease. 2. Not hypermetabolic sclerotic lesion in the posterior right femoral head is most likely an enchondroma or bone island.   Original Report Authenticated By: Consuello Bossier, M.D.    Dg Chest Port 1 View  04/04/2012  *RADIOLOGY REPORT*  Clinical Data: Post right-sided subclavian port placement  PORTABLE CHEST - 1 VIEW  Comparison: None.  Findings:  Borderline enlarged cardiac silhouette and mediastinal contours, likely accentuated due to decreased lung volumes.  Right subclavian vein approach port a catheter tip overlies the superior cavoatrial junction.  Left lateral chest wall/breast surgical drain.  Surgical clips overlie the left axilla.  There is mild elevation of the right hemidiaphragm with bibasilar heterogeneous opacities, right greater than left.  No definite pneumothorax.  No definite pleural effusion.  Unchanged bones.  IMPRESSION: 1.  Right subclavian vein approach port a catheter tip overlies the superior cavoatrial junction.  No pneumothorax. 2.  Left lateral chest wall/breast surgical drain with left axillary surgical clips. 3.  Decreased lung volumes and elevation of the right hemidiaphragm with bibasilar opacities, right greater than left, likely atelectasis.   Original Report Authenticated By: Waynard Reeds, M.D.    Dg Fluoro Guide Cv  Line-no Report  04/04/2012  CLINICAL DATA: Port a cath   FLOURO GUIDE CV LINE  Fluoroscopy was utilized by the requesting physician.  No radiographic  interpretation.       IMPRESSIONS AND PLAN: A 41 y.o. female with   Hx of multifocal breast cancer, her2+; D1 C3 TCH, she is doing well, with minimal side effects. I have recommended she use salt water rinses after meals this week, and for her to use saline nasal spray twice daily.  She will return next week for Herceptin and a follow up appt with Dr. Donnie Coffin.    All questions were answered.  The patient knows to call the clinic with any problems, questions, or concerns.  We can certainly see the patient much sooner if necessary.    I spent 15 minutes counseling the patient face to face.  The total time spent in the appointment was 30 minutes.    This case was reviewed by Dr. Park Breed.    Cherie Ouch Lyn Hollingshead, NP Medical Oncology [facility] Phone: [phone]

## 2012-06-14 NOTE — Patient Instructions (Addendum)
Doing well, proceed with chemo.  Please call us if you have any questions or concerns.

## 2012-06-15 ENCOUNTER — Ambulatory Visit (HOSPITAL_BASED_OUTPATIENT_CLINIC_OR_DEPARTMENT_OTHER): Payer: PRIVATE HEALTH INSURANCE

## 2012-06-15 VITALS — BP 174/96 | HR 76 | Temp 97.9°F

## 2012-06-15 DIAGNOSIS — C50919 Malignant neoplasm of unspecified site of unspecified female breast: Secondary | ICD-10-CM

## 2012-06-15 DIAGNOSIS — Z5189 Encounter for other specified aftercare: Secondary | ICD-10-CM

## 2012-06-15 DIAGNOSIS — C50419 Malignant neoplasm of upper-outer quadrant of unspecified female breast: Secondary | ICD-10-CM

## 2012-06-15 MED ORDER — PEGFILGRASTIM INJECTION 6 MG/0.6ML
6.0000 mg | Freq: Once | SUBCUTANEOUS | Status: AC
  Administered 2012-06-15: 6 mg via SUBCUTANEOUS

## 2012-06-18 ENCOUNTER — Ambulatory Visit: Payer: PRIVATE HEALTH INSURANCE | Admitting: Oncology

## 2012-06-20 ENCOUNTER — Other Ambulatory Visit: Payer: Self-pay | Admitting: *Deleted

## 2012-06-20 DIAGNOSIS — C50419 Malignant neoplasm of upper-outer quadrant of unspecified female breast: Secondary | ICD-10-CM

## 2012-06-21 ENCOUNTER — Ambulatory Visit (HOSPITAL_BASED_OUTPATIENT_CLINIC_OR_DEPARTMENT_OTHER): Payer: PRIVATE HEALTH INSURANCE

## 2012-06-21 ENCOUNTER — Ambulatory Visit (HOSPITAL_BASED_OUTPATIENT_CLINIC_OR_DEPARTMENT_OTHER): Payer: PRIVATE HEALTH INSURANCE | Admitting: Oncology

## 2012-06-21 ENCOUNTER — Other Ambulatory Visit (HOSPITAL_BASED_OUTPATIENT_CLINIC_OR_DEPARTMENT_OTHER): Payer: PRIVATE HEALTH INSURANCE | Admitting: Lab

## 2012-06-21 VITALS — BP 134/89 | HR 112 | Temp 99.4°F | Resp 20 | Ht 60.0 in | Wt 160.7 lb

## 2012-06-21 DIAGNOSIS — C50419 Malignant neoplasm of upper-outer quadrant of unspecified female breast: Secondary | ICD-10-CM

## 2012-06-21 DIAGNOSIS — C50919 Malignant neoplasm of unspecified site of unspecified female breast: Secondary | ICD-10-CM

## 2012-06-21 DIAGNOSIS — C773 Secondary and unspecified malignant neoplasm of axilla and upper limb lymph nodes: Secondary | ICD-10-CM

## 2012-06-21 DIAGNOSIS — Z5112 Encounter for antineoplastic immunotherapy: Secondary | ICD-10-CM

## 2012-06-21 LAB — CBC WITH DIFFERENTIAL/PLATELET
Basophils Absolute: 0.1 10*3/uL (ref 0.0–0.1)
Eosinophils Absolute: 0 10*3/uL (ref 0.0–0.5)
HGB: 10.3 g/dL — ABNORMAL LOW (ref 11.6–15.9)
LYMPH%: 23.5 % (ref 14.0–49.7)
MCV: 78 fL — ABNORMAL LOW (ref 79.5–101.0)
MONO#: 1.5 10*3/uL — ABNORMAL HIGH (ref 0.1–0.9)
NEUT#: 5.7 10*3/uL (ref 1.5–6.5)
Platelets: 274 10*3/uL (ref 145–400)
RBC: 4.22 10*6/uL (ref 3.70–5.45)
WBC: 9.5 10*3/uL (ref 3.9–10.3)
nRBC: 1 % — ABNORMAL HIGH (ref 0–0)

## 2012-06-21 MED ORDER — SODIUM CHLORIDE 0.9 % IV SOLN
Freq: Once | INTRAVENOUS | Status: AC
  Administered 2012-06-21: 14:00:00 via INTRAVENOUS

## 2012-06-21 MED ORDER — ACETAMINOPHEN 325 MG PO TABS
650.0000 mg | ORAL_TABLET | Freq: Once | ORAL | Status: AC
  Administered 2012-06-21: 650 mg via ORAL

## 2012-06-21 MED ORDER — DIPHENHYDRAMINE HCL 25 MG PO CAPS
50.0000 mg | ORAL_CAPSULE | Freq: Once | ORAL | Status: AC
  Administered 2012-06-21: 50 mg via ORAL

## 2012-06-21 MED ORDER — SODIUM CHLORIDE 0.9 % IJ SOLN
10.0000 mL | INTRAMUSCULAR | Status: DC | PRN
  Administered 2012-06-21: 10 mL
  Filled 2012-06-21: qty 10

## 2012-06-21 MED ORDER — HEPARIN SOD (PORK) LOCK FLUSH 100 UNIT/ML IV SOLN
500.0000 [IU] | Freq: Once | INTRAVENOUS | Status: AC | PRN
  Administered 2012-06-21: 500 [IU]
  Filled 2012-06-21: qty 5

## 2012-06-21 MED ORDER — TRASTUZUMAB CHEMO INJECTION 440 MG
2.0000 mg/kg | Freq: Once | INTRAVENOUS | Status: AC
  Administered 2012-06-21: 147 mg via INTRAVENOUS
  Filled 2012-06-21: qty 7

## 2012-06-21 NOTE — Patient Instructions (Addendum)
[facility] Discharge Instructions for Patients Receiving Chemotherapy  Today you received the following chemotherapy agents Herceptin To help prevent nausea and vomiting after your treatment, we encourage you to take your nausea medication as prescribed.  If you develop nausea and vomiting that is not controlled by your nausea medication, call the clinic. If it is after clinic hours your family physician or the after hours number for the clinic or go to the Emergency Department.   BELOW ARE SYMPTOMS THAT SHOULD BE REPORTED IMMEDIATELY:  *FEVER GREATER THAN 100.5 F  *CHILLS WITH OR WITHOUT FEVER  NAUSEA AND VOMITING THAT IS NOT CONTROLLED WITH YOUR NAUSEA MEDICATION  *UNUSUAL SHORTNESS OF BREATH  *UNUSUAL BRUISING OR BLEEDING  TENDERNESS IN MOUTH AND THROAT WITH OR WITHOUT PRESENCE OF ULCERS  *URINARY PROBLEMS  *BOWEL PROBLEMS  UNUSUAL RASH Items with * indicate a potential emergency and should be followed up as soon as possible.  One of the nurses will contact you 24 hours after your treatment. Please let the nurse know about any problems that you may have experienced. Feel free to call the clinic you have any questions or concerns. The clinic phone number is [phone].   I have been informed and understand all the instructions given to me. I know to contact the clinic, my physician, or go to the Emergency Department if any problems should occur. I do not have any questions at this time, but understand that I may call the clinic during office hours   should I have any questions or need assistance in obtaining follow up care.    __________________________________________  _____________  __________ Signature of Patient or Authorized Representative            Date                   Time    __________________________________________ Nurse's Signature    

## 2012-06-21 NOTE — Progress Notes (Signed)
Hematology and Oncology Follow Up Visit  Alicia Pena [id number] [birth year] 40 y.o. 06/21/2012 1:23 PM   DIAGNOSIS:   No diagnosis found.   PAST THERAPY:  T2N2a Her2+ breast cancer, s/p mastectomy, s/p TCH, cycle #3  Interim History:  Day 7 cycle 3 TCH, she did well with treatment. She is still trying to work. She is complaining of a dry mouth, but no sores. She hs not had any more hematuria. She notes occasional nose bleeds, possibly due to dryness. Medications: I have reviewed the patient's current medications.  Allergies:  Allergies  Allergen Reactions  . Duricef (Cefadroxil) Hives and Nausea Only    Past Medical History, Surgical history, Social history, and Family History were reviewed and updated.  Review of Systems: Constitutional:  Negative for fever, chills, night sweats, anorexia, weight loss, pain. Cardiovascular: no chest pain or dyspnea on exertion Respiratory: no cough, shortness of breath, or wheezing Neurological: no TIA or stroke symptoms Dermatological: negative ENT: negative Skin Gastrointestinal: negative Genito-Urinary: negative Hematological and Lymphatic: negative Breast: negative Musculoskeletal: negative Remaining ROS negative.  Physical Exam:  Blood pressure 134/89, pulse 112, temperature 99.4 F (37.4 C), temperature source Oral, resp. rate 20, height 5' (1.524 m), weight 160 lb 11.2 oz (72.893 kg).  ECOG: 0   HEENT:  Sclerae anicteric, conjunctivae pink.  Oropharynx clear.  No mucositis or candidiasis.  Nodes:  No cervical, supraclavicular, or axillary lymphadenopathy palpated. Port  Lungs:  Clear to auscultation bilaterally.  No crackles, rhonchi, or wheezes.  Heart:  Regular rate and rhythm.  Abdomen:  Soft, nontender.  Positive bowel sounds.  No organomegaly or masses palpated.  Musculoskeletal:  No focal spinal tenderness to palpation.  Extremities:  Benign.  No peripheral edema or cyanosis.  Skin:  Benign.  Neuro:  Nonfocal.    Lab  Results: Lab Results  Component Value Date   WBC 9.5 06/21/2012   HGB 10.3* 06/21/2012   HCT 32.9* 06/21/2012   MCV 78.0* 06/21/2012   PLT 274 06/21/2012     Chemistry      Component Value Date/Time   NA 144 06/14/2012 1337   NA 138 04/02/2012 1100   K 3.9 06/14/2012 1337   K 3.9 04/02/2012 1100   CL 110* 06/14/2012 1337   CL 103 04/02/2012 1100   CO2 26 06/14/2012 1337   CO2 25 04/02/2012 1100   BUN 23.0 06/14/2012 1337   BUN 16 04/02/2012 1100   CREATININE 0.8 06/14/2012 1337   CREATININE 0.49* 04/02/2012 1100      Component Value Date/Time   CALCIUM 8.9 06/14/2012 1337   CALCIUM 9.0 04/02/2012 1100   ALKPHOS 94 06/14/2012 1337   AST 16 06/14/2012 1337   ALT 35 06/14/2012 1337   BILITOT 0.42 06/14/2012 1337       Radiological Studies:  Nm Pet Image Initial (pi) Skull Base To Thigh  04/09/2012  *RADIOLOGY REPORT*  Clinical Data: Initial treatment strategy for new diagnosis of breast cancer.  NUCLEAR MEDICINE PET SKULL BASE TO THIGH  Fasting Blood Glucose:  92  Technique:  17.1 mCi F-18 FDG was injected intravenously. CT data was obtained and used for attenuation correction and anatomic localization only.  (This was not acquired as a diagnostic CT examination.) Additional exam technical data entered on technologist worksheet.  Comparison:  MRI breast of 02/11/2012.  Chest film of 04/04/2012.  Findings:  Neck: No abnormal hypermetabolism.  Chest:  Low level hypermetabolism corresponds to  surgical clips and edema within the left axilla and left  chest wall.  This is consistent with postoperative change.  No focal abnormality within either axilla or the right breast.  Abdomen/Pelvis:  No abnormal hypermetabolism.  Skeleton:  No abnormal bony hypermetabolism.  There is right upper extremity hypermetabolism which is presumably the site of injection with minimal extravasation.  CT  images performed for attenuation correction demonstrate no significant findings within the neck.  A  right-sided Port-A-Cath terminates at the high right atrium. Mild cardiomegaly.  Convex left lumbar spine curvature.  Hysterectomy.  Sclerotic lesion within the right femoral neck which is not significantly hypermetabolic.  This measures 1.6 cm on image 195.  IMPRESSION:  1.  Surgical changes within the left chest and axilla.  No evidence of hypermetabolic metastatic disease. 2. Not hypermetabolic sclerotic lesion in the posterior right femoral head is most likely an enchondroma or bone island.   Original Report Authenticated By: Consuello Bossier, M.D.    Dg Chest Port 1 View  04/04/2012  *RADIOLOGY REPORT*  Clinical Data: Post right-sided subclavian port placement  PORTABLE CHEST - 1 VIEW  Comparison: None.  Findings:  Borderline enlarged cardiac silhouette and mediastinal contours, likely accentuated due to decreased lung volumes.  Right subclavian vein approach port a catheter tip overlies the superior cavoatrial junction.  Left lateral chest wall/breast surgical drain.  Surgical clips overlie the left axilla.  There is mild elevation of the right hemidiaphragm with bibasilar heterogeneous opacities, right greater than left.  No definite pneumothorax.  No definite pleural effusion.  Unchanged bones.  IMPRESSION: 1.  Right subclavian vein approach port a catheter tip overlies the superior cavoatrial junction.  No pneumothorax. 2.  Left lateral chest wall/breast surgical drain with left axillary surgical clips. 3.  Decreased lung volumes and elevation of the right hemidiaphragm with bibasilar opacities, right greater than left, likely atelectasis.   Original Report Authenticated By: Waynard Reeds, M.D.    Dg Fluoro Guide Cv Line-no Report  04/04/2012  CLINICAL DATA: Port a cath   FLOURO GUIDE CV LINE  Fluoroscopy was utilized by the requesting physician.  No radiographic  interpretation.       IMPRESSIONS AND PLAN: A 41 y.o. female with   Hx of multifocal breast cancer, her2+; D14 C3 TCH, she is doing  well, with minimal side effects. I will see he in 1 weeks , herceptin will continue weekly.she will return in 1 week for herceptin.    Alicia Pena 12/20/20131:23 PM Cell [phone]

## 2012-06-22 ENCOUNTER — Telehealth: Payer: Self-pay | Admitting: *Deleted

## 2012-06-22 NOTE — Telephone Encounter (Signed)
Called pt to inform her that Dr. Donnie Coffin will no longer be with the practice as of 07/03/12 and she was aware.  Answered her questions that she had at this time.  I confirmed 07/05/12 appt w/ pt.  She was ok with meeting Lillia Abed on 1/3, but would like to meet both physicians before she made up her mind on who she wanted to be cared by.  She however, did no want any lapses in her treatment, so I explained the reasoning of scheduling her at this time and she was fine.

## 2012-06-25 ENCOUNTER — Other Ambulatory Visit: Payer: Self-pay | Admitting: *Deleted

## 2012-06-25 DIAGNOSIS — C50419 Malignant neoplasm of upper-outer quadrant of unspecified female breast: Secondary | ICD-10-CM

## 2012-06-28 ENCOUNTER — Other Ambulatory Visit (HOSPITAL_BASED_OUTPATIENT_CLINIC_OR_DEPARTMENT_OTHER): Payer: PRIVATE HEALTH INSURANCE | Admitting: Lab

## 2012-06-28 ENCOUNTER — Ambulatory Visit (HOSPITAL_BASED_OUTPATIENT_CLINIC_OR_DEPARTMENT_OTHER): Payer: PRIVATE HEALTH INSURANCE

## 2012-06-28 ENCOUNTER — Ambulatory Visit (HOSPITAL_BASED_OUTPATIENT_CLINIC_OR_DEPARTMENT_OTHER): Payer: PRIVATE HEALTH INSURANCE | Admitting: Oncology

## 2012-06-28 VITALS — BP 149/90 | HR 86 | Temp 97.0°F | Resp 20 | Ht 60.0 in | Wt 164.4 lb

## 2012-06-28 DIAGNOSIS — Z5112 Encounter for antineoplastic immunotherapy: Secondary | ICD-10-CM

## 2012-06-28 DIAGNOSIS — Z170 Estrogen receptor positive status [ER+]: Secondary | ICD-10-CM

## 2012-06-28 DIAGNOSIS — C50919 Malignant neoplasm of unspecified site of unspecified female breast: Secondary | ICD-10-CM

## 2012-06-28 DIAGNOSIS — C50419 Malignant neoplasm of upper-outer quadrant of unspecified female breast: Secondary | ICD-10-CM

## 2012-06-28 LAB — CBC WITH DIFFERENTIAL/PLATELET
Basophils Absolute: 0 10*3/uL (ref 0.0–0.1)
Eosinophils Absolute: 0 10*3/uL (ref 0.0–0.5)
HGB: 9.5 g/dL — ABNORMAL LOW (ref 11.6–15.9)
LYMPH%: 20.4 % (ref 14.0–49.7)
MCV: 80.4 fL (ref 79.5–101.0)
MONO%: 2.7 % (ref 0.0–14.0)
NEUT#: 6 10*3/uL (ref 1.5–6.5)
NEUT%: 76.8 % (ref 38.4–76.8)
Platelets: 95 10*3/uL — ABNORMAL LOW (ref 145–400)

## 2012-06-28 LAB — COMPREHENSIVE METABOLIC PANEL (CC13)
CO2: 24 mEq/L (ref 22–29)
Creatinine: 0.6 mg/dL (ref 0.6–1.1)
Glucose: 102 mg/dl — ABNORMAL HIGH (ref 70–99)
Total Bilirubin: 0.62 mg/dL (ref 0.20–1.20)
Total Protein: 5.8 g/dL — ABNORMAL LOW (ref 6.4–8.3)

## 2012-06-28 MED ORDER — SODIUM CHLORIDE 0.9 % IJ SOLN
10.0000 mL | INTRAMUSCULAR | Status: DC | PRN
  Administered 2012-06-28: 10 mL
  Filled 2012-06-28: qty 10

## 2012-06-28 MED ORDER — DIPHENHYDRAMINE HCL 25 MG PO CAPS
50.0000 mg | ORAL_CAPSULE | Freq: Once | ORAL | Status: AC
  Administered 2012-06-28: 50 mg via ORAL

## 2012-06-28 MED ORDER — SODIUM CHLORIDE 0.9 % IV SOLN
Freq: Once | INTRAVENOUS | Status: AC
  Administered 2012-06-28: 13:00:00 via INTRAVENOUS

## 2012-06-28 MED ORDER — ACETAMINOPHEN 325 MG PO TABS
650.0000 mg | ORAL_TABLET | Freq: Once | ORAL | Status: AC
  Administered 2012-06-28: 650 mg via ORAL

## 2012-06-28 MED ORDER — TRASTUZUMAB CHEMO INJECTION 440 MG
2.0000 mg/kg | Freq: Once | INTRAVENOUS | Status: AC
  Administered 2012-06-28: 147 mg via INTRAVENOUS
  Filled 2012-06-28: qty 7

## 2012-06-28 MED ORDER — HEPARIN SOD (PORK) LOCK FLUSH 100 UNIT/ML IV SOLN
500.0000 [IU] | Freq: Once | INTRAVENOUS | Status: AC | PRN
  Administered 2012-06-28: 500 [IU]
  Filled 2012-06-28: qty 5

## 2012-06-28 NOTE — Patient Instructions (Addendum)
[facility] Discharge Instructions for Patients Receiving Chemotherapy  Today you received the following chemotherapy agents Herceptin.  BELOW ARE SYMPTOMS THAT SHOULD BE REPORTED IMMEDIATELY:  *FEVER GREATER THAN 100.5 F  *CHILLS WITH OR WITHOUT FEVER  NAUSEA AND VOMITING THAT IS NOT CONTROLLED WITH YOUR NAUSEA MEDICATION  *UNUSUAL SHORTNESS OF BREATH  *UNUSUAL BRUISING OR BLEEDING  TENDERNESS IN MOUTH AND THROAT WITH OR WITHOUT PRESENCE OF ULCERS  *URINARY PROBLEMS  *BOWEL PROBLEMS  UNUSUAL RASH Items with * indicate a potential emergency and should be followed up as soon as possible.  Feel free to call the clinic you have any questions or concerns at [phone].    Trastuzumab injection for infusion What is this medicine? TRASTUZUMAB (tras TOO zoo mab) is a monoclonal antibody. It targets a protein called HER2. This protein is found in some stomach and breast cancers. This medicine can stop cancer cell growth. This medicine may be used with other cancer treatments. This medicine may be used for other purposes; ask your health care provider or pharmacist if you have questions. What should I tell my health care provider before I take this medicine? They need to know if you have any of these conditions: -heart disease -heart failure -infection (especially a virus infection such as chickenpox, cold sores, or herpes) -lung or breathing disease, like asthma -recent or ongoing radiation therapy -an unusual or allergic reaction to trastuzumab, benzyl alcohol, or other medications, foods, dyes, or preservatives -pregnant or trying to get pregnant -breast-feeding How should I use this medicine? This drug is given as an infusion into a vein. It is administered in a hospital or clinic by a specially trained health care professional. Talk to your pediatrician regarding the use of this medicine in children. This medicine is not approved for use in  children. Overdosage: If you think you have taken too much of this medicine contact a poison control center or emergency room at once. NOTE: This medicine is only for you. Do not share this medicine with others. What if I miss a dose? It is important not to miss a dose. Call your doctor or health care professional if you are unable to keep an appointment. What may interact with this medicine? -cyclophosphamide -doxorubicin -warfarin This list may not describe all possible interactions. Give your health care provider a list of all the medicines, herbs, non-prescription drugs, or dietary supplements you use. Also tell them if you smoke, drink alcohol, or use illegal drugs. Some items may interact with your medicine. What should I watch for while using this medicine? Visit your doctor for checks on your progress. Report any side effects. Continue your course of treatment even though you feel ill unless your doctor tells you to stop. Call your doctor or health care professional for advice if you get a fever, chills or sore throat, or other symptoms of a cold or flu. Do not treat yourself. Try to avoid being around people who are sick. You may experience fever, chills and shaking during your first infusion. These effects are usually mild and can be treated with other medicines. Report any side effects during the infusion to your health care professional. Fever and chills usually do not happen with later infusions. What side effects may I notice from receiving this medicine? Side effects that you should report to your doctor or other health care professional as soon as possible: -breathing difficulties -chest pain or palpitations -cough -dizziness or fainting -fever or chills, sore throat -skin rash, itching  or hives -swelling of the legs or ankles -unusually weak or tired Side effects that usually do not require medical attention (report to your doctor or other health care professional if they  continue or are bothersome): -loss of appetite -headache -muscle aches -nausea This list may not describe all possible side effects. Call your doctor for medical advice about side effects. You may report side effects to FDA at 1-800-FDA-1088. Where should I keep my medicine? This drug is given in a hospital or clinic and will not be stored at home. NOTE: This sheet is a summary. It may not cover all possible information. If you have questions about this medicine, talk to your doctor, pharmacist, or health care provider.  2013, [employer]. (04/23/2009 1:43:15 PM)

## 2012-06-28 NOTE — Progress Notes (Signed)
Hematology and Oncology Follow Up Visit  Alicia Pena [id number] Nov 29, 1970 41 y.o. 06/28/2012 12:32 PM   DIAGNOSIS:   No diagnosis found.   PAST THERAPY:  T2N2a Her2+ breast cancer, s/p mastectomy, s/p TCH, cycle #3  Interim History:  Day 14  cycle 3 TCH, she did well with treatment. She is still trying to work. She is complaining of a dry mouth, but no sores. She hs not had any more hematuria. She notes occasional nose bleeds, possibly due to dryness. Medications: I have reviewed the patient's current medications.  Allergies:  Allergies  Allergen Reactions  . Duricef (Cefadroxil) Hives and Nausea Only    Past Medical History, Surgical history, Social history, and Family History were reviewed and updated.  Review of Systems: Constitutional:  Negative for fever, chills, night sweats, anorexia, weight loss, pain. Cardiovascular: no chest pain or dyspnea on exertion Respiratory: no cough, shortness of breath, or wheezing Neurological: no TIA or stroke symptoms Dermatological: negative ENT: negative Skin Gastrointestinal: negative Genito-Urinary: negative Hematological and Lymphatic: negative Breast: negative Musculoskeletal: negative Remaining ROS negative.  Physical Exam:  Blood pressure 149/90, pulse 86, temperature 97 F (36.1 C), resp. rate 20, height 5' (1.524 m), weight 164 lb 6.4 oz (74.571 kg).  ECOG: 0   HEENT:  Sclerae anicteric, conjunctivae pink.  Oropharynx clear.  No mucositis or candidiasis.  Nodes:  No cervical, supraclavicular, or axillary lymphadenopathy palpated. Port  Lungs:  Clear to auscultation bilaterally.  No crackles, rhonchi, or wheezes.  Heart:  Regular rate and rhythm.  Abdomen:  Soft, nontender.  Positive bowel sounds.  No organomegaly or masses palpated.  Musculoskeletal:  No focal spinal tenderness to palpation.  Extremities:  Benign.  No peripheral edema or cyanosis.  Skin:  Benign.  Neuro:  Nonfocal.    Lab Results: Lab Results    Component Value Date   WBC 7.8 06/28/2012   HGB 9.5* 06/28/2012   HCT 30.7* 06/28/2012   MCV 80.4 06/28/2012   PLT 95* 06/28/2012     Chemistry      Component Value Date/Time   NA 144 06/14/2012 1337   NA 138 04/02/2012 1100   K 3.9 06/14/2012 1337   K 3.9 04/02/2012 1100   CL 110* 06/14/2012 1337   CL 103 04/02/2012 1100   CO2 26 06/14/2012 1337   CO2 25 04/02/2012 1100   BUN 23.0 06/14/2012 1337   BUN 16 04/02/2012 1100   CREATININE 0.8 06/14/2012 1337   CREATININE 0.49* 04/02/2012 1100      Component Value Date/Time   CALCIUM 8.9 06/14/2012 1337   CALCIUM 9.0 04/02/2012 1100   ALKPHOS 94 06/14/2012 1337   AST 16 06/14/2012 1337   ALT 35 06/14/2012 1337   BILITOT 0.42 06/14/2012 1337       IMPRESSIONS AND PLAN: A 41 y.o. female with   Hx of multifocal breast cancer, her2+; D14 C3 TCH, she is doing well, with minimal side effects. I will see he in 1 weeks , herceptin will continue weekly.she will return in 1 week for herceptin. F/u 2 d echo in 1/14 return in 1 week for cycle 4   Alicia Pena 12/27/201312:32 PM Cell [phone]

## 2012-06-29 LAB — CANCER ANTIGEN 27.29: CA 27.29: 21 U/mL (ref 0–39)

## 2012-07-01 ENCOUNTER — Other Ambulatory Visit: Payer: Self-pay | Admitting: *Deleted

## 2012-07-01 DIAGNOSIS — C50419 Malignant neoplasm of upper-outer quadrant of unspecified female breast: Secondary | ICD-10-CM

## 2012-07-05 ENCOUNTER — Ambulatory Visit (HOSPITAL_BASED_OUTPATIENT_CLINIC_OR_DEPARTMENT_OTHER): Payer: PRIVATE HEALTH INSURANCE

## 2012-07-05 ENCOUNTER — Encounter: Payer: Self-pay | Admitting: Oncology

## 2012-07-05 ENCOUNTER — Other Ambulatory Visit (HOSPITAL_BASED_OUTPATIENT_CLINIC_OR_DEPARTMENT_OTHER): Payer: PRIVATE HEALTH INSURANCE | Admitting: Lab

## 2012-07-05 ENCOUNTER — Ambulatory Visit: Payer: PRIVATE HEALTH INSURANCE | Admitting: Oncology

## 2012-07-05 ENCOUNTER — Other Ambulatory Visit: Payer: PRIVATE HEALTH INSURANCE | Admitting: Lab

## 2012-07-05 ENCOUNTER — Ambulatory Visit (HOSPITAL_BASED_OUTPATIENT_CLINIC_OR_DEPARTMENT_OTHER): Payer: PRIVATE HEALTH INSURANCE | Admitting: Oncology

## 2012-07-05 VITALS — BP 163/95 | HR 114 | Temp 97.7°F | Resp 20 | Ht 60.0 in | Wt 164.0 lb

## 2012-07-05 DIAGNOSIS — C50419 Malignant neoplasm of upper-outer quadrant of unspecified female breast: Secondary | ICD-10-CM

## 2012-07-05 DIAGNOSIS — C50919 Malignant neoplasm of unspecified site of unspecified female breast: Secondary | ICD-10-CM

## 2012-07-05 DIAGNOSIS — Z5112 Encounter for antineoplastic immunotherapy: Secondary | ICD-10-CM

## 2012-07-05 DIAGNOSIS — Z5111 Encounter for antineoplastic chemotherapy: Secondary | ICD-10-CM

## 2012-07-05 DIAGNOSIS — C773 Secondary and unspecified malignant neoplasm of axilla and upper limb lymph nodes: Secondary | ICD-10-CM

## 2012-07-05 DIAGNOSIS — R5381 Other malaise: Secondary | ICD-10-CM

## 2012-07-05 LAB — CBC WITH DIFFERENTIAL/PLATELET
BASO%: 0 % (ref 0.0–2.0)
EOS%: 0 % (ref 0.0–7.0)
MCH: 26 pg (ref 25.1–34.0)
MCHC: 31.9 g/dL (ref 31.5–36.0)
NEUT%: 88.1 % — ABNORMAL HIGH (ref 38.4–76.8)
RDW: 25.5 % — ABNORMAL HIGH (ref 11.2–14.5)
lymph#: 0.8 10*3/uL — ABNORMAL LOW (ref 0.9–3.3)

## 2012-07-05 LAB — COMPREHENSIVE METABOLIC PANEL (CC13)
ALT: 23 U/L (ref 0–55)
AST: 15 U/L (ref 5–34)
Calcium: 9.2 mg/dL (ref 8.4–10.4)
Chloride: 111 mEq/L — ABNORMAL HIGH (ref 98–107)
Creatinine: 0.7 mg/dL (ref 0.6–1.1)

## 2012-07-05 MED ORDER — SODIUM CHLORIDE 0.9 % IV SOLN
600.0000 mg | Freq: Once | INTRAVENOUS | Status: AC
  Administered 2012-07-05: 600 mg via INTRAVENOUS
  Filled 2012-07-05: qty 60

## 2012-07-05 MED ORDER — DOCETAXEL CHEMO INJECTION 160 MG/16ML
75.0000 mg/m2 | Freq: Once | INTRAVENOUS | Status: AC
  Administered 2012-07-05: 130 mg via INTRAVENOUS
  Filled 2012-07-05: qty 13

## 2012-07-05 MED ORDER — ONDANSETRON 16 MG/50ML IVPB (CHCC)
16.0000 mg | Freq: Once | INTRAVENOUS | Status: AC
  Administered 2012-07-05: 16 mg via INTRAVENOUS

## 2012-07-05 MED ORDER — TRASTUZUMAB CHEMO INJECTION 440 MG
2.0000 mg/kg | Freq: Once | INTRAVENOUS | Status: AC
  Administered 2012-07-05: 147 mg via INTRAVENOUS
  Filled 2012-07-05: qty 7

## 2012-07-05 MED ORDER — DIPHENHYDRAMINE HCL 25 MG PO CAPS
50.0000 mg | ORAL_CAPSULE | Freq: Once | ORAL | Status: AC
  Administered 2012-07-05: 50 mg via ORAL

## 2012-07-05 MED ORDER — ACETAMINOPHEN 325 MG PO TABS
650.0000 mg | ORAL_TABLET | Freq: Once | ORAL | Status: AC
  Administered 2012-07-05: 650 mg via ORAL

## 2012-07-05 MED ORDER — SODIUM CHLORIDE 0.9 % IV SOLN
Freq: Once | INTRAVENOUS | Status: AC
  Administered 2012-07-05: 20 mL via INTRAVENOUS

## 2012-07-05 MED ORDER — HEPARIN SOD (PORK) LOCK FLUSH 100 UNIT/ML IV SOLN
500.0000 [IU] | Freq: Once | INTRAVENOUS | Status: AC | PRN
  Administered 2012-07-05: 500 [IU]
  Filled 2012-07-05: qty 5

## 2012-07-05 MED ORDER — SODIUM CHLORIDE 0.9 % IJ SOLN
10.0000 mL | INTRAMUSCULAR | Status: DC | PRN
  Administered 2012-07-05: 10 mL
  Filled 2012-07-05: qty 10

## 2012-07-05 MED ORDER — DEXAMETHASONE SODIUM PHOSPHATE 10 MG/ML IJ SOLN
20.0000 mg | Freq: Once | INTRAMUSCULAR | Status: AC
  Administered 2012-07-05: 20 mg via INTRAVENOUS

## 2012-07-05 NOTE — Patient Instructions (Addendum)
Proceed with TCH today  We will see you back in 1 week

## 2012-07-06 ENCOUNTER — Ambulatory Visit (HOSPITAL_BASED_OUTPATIENT_CLINIC_OR_DEPARTMENT_OTHER): Payer: PRIVATE HEALTH INSURANCE

## 2012-07-06 ENCOUNTER — Encounter: Payer: Self-pay | Admitting: Oncology

## 2012-07-06 VITALS — BP 155/98 | HR 59 | Temp 97.5°F | Resp 18

## 2012-07-06 DIAGNOSIS — Z5189 Encounter for other specified aftercare: Secondary | ICD-10-CM

## 2012-07-06 DIAGNOSIS — C50419 Malignant neoplasm of upper-outer quadrant of unspecified female breast: Secondary | ICD-10-CM

## 2012-07-06 DIAGNOSIS — C50919 Malignant neoplasm of unspecified site of unspecified female breast: Secondary | ICD-10-CM

## 2012-07-06 MED ORDER — PEGFILGRASTIM INJECTION 6 MG/0.6ML
6.0000 mg | Freq: Once | SUBCUTANEOUS | Status: AC
  Administered 2012-07-06: 6 mg via SUBCUTANEOUS

## 2012-07-07 ENCOUNTER — Other Ambulatory Visit: Payer: Self-pay | Admitting: Oncology

## 2012-07-09 ENCOUNTER — Ambulatory Visit: Payer: PRIVATE HEALTH INSURANCE | Admitting: Oncology

## 2012-07-10 ENCOUNTER — Telehealth: Payer: Self-pay | Admitting: Oncology

## 2012-07-10 NOTE — Telephone Encounter (Signed)
S/w pt re next appt for 1/10 and also PT appt for 1/15. Pt given PT appt d/t/location/phone #. [facility] [address] [phone]). Pt aware she will be contacted w/echo appt and will get new schedule when she comes in.

## 2012-07-11 ENCOUNTER — Telehealth: Payer: Self-pay | Admitting: Oncology

## 2012-07-11 NOTE — Telephone Encounter (Signed)
S/w pt re appt for echo @ [facility] 1/15 @ 1pm. Same day as PT appt.

## 2012-07-12 ENCOUNTER — Encounter: Payer: Self-pay | Admitting: Adult Health

## 2012-07-12 ENCOUNTER — Other Ambulatory Visit (HOSPITAL_BASED_OUTPATIENT_CLINIC_OR_DEPARTMENT_OTHER): Payer: PRIVATE HEALTH INSURANCE | Admitting: Lab

## 2012-07-12 ENCOUNTER — Ambulatory Visit (HOSPITAL_BASED_OUTPATIENT_CLINIC_OR_DEPARTMENT_OTHER): Payer: PRIVATE HEALTH INSURANCE | Admitting: Adult Health

## 2012-07-12 ENCOUNTER — Ambulatory Visit (HOSPITAL_BASED_OUTPATIENT_CLINIC_OR_DEPARTMENT_OTHER): Payer: PRIVATE HEALTH INSURANCE

## 2012-07-12 ENCOUNTER — Other Ambulatory Visit: Payer: Self-pay

## 2012-07-12 VITALS — BP 133/80 | HR 120 | Temp 98.6°F | Resp 20 | Ht 60.0 in | Wt 162.0 lb

## 2012-07-12 DIAGNOSIS — C50919 Malignant neoplasm of unspecified site of unspecified female breast: Secondary | ICD-10-CM

## 2012-07-12 DIAGNOSIS — C50219 Malignant neoplasm of upper-inner quadrant of unspecified female breast: Secondary | ICD-10-CM

## 2012-07-12 DIAGNOSIS — C50419 Malignant neoplasm of upper-outer quadrant of unspecified female breast: Secondary | ICD-10-CM

## 2012-07-12 DIAGNOSIS — R000 Tachycardia, unspecified: Secondary | ICD-10-CM

## 2012-07-12 DIAGNOSIS — Z170 Estrogen receptor positive status [ER+]: Secondary | ICD-10-CM

## 2012-07-12 DIAGNOSIS — E860 Dehydration: Secondary | ICD-10-CM

## 2012-07-12 DIAGNOSIS — Z5112 Encounter for antineoplastic immunotherapy: Secondary | ICD-10-CM

## 2012-07-12 LAB — CBC WITH DIFFERENTIAL/PLATELET
Basophils Absolute: 0 10*3/uL (ref 0.0–0.1)
Eosinophils Absolute: 0 10*3/uL (ref 0.0–0.5)
HGB: 10.3 g/dL — ABNORMAL LOW (ref 11.6–15.9)
MCV: 81.2 fL (ref 79.5–101.0)
MONO#: 1.1 10*3/uL — ABNORMAL HIGH (ref 0.1–0.9)
MONO%: 13.4 % (ref 0.0–14.0)
NEUT#: 4.7 10*3/uL (ref 1.5–6.5)
RBC: 3.99 10*6/uL (ref 3.70–5.45)
RDW: 24.5 % — ABNORMAL HIGH (ref 11.2–14.5)
WBC: 7.9 10*3/uL (ref 3.9–10.3)
lymph#: 2.1 10*3/uL (ref 0.9–3.3)
nRBC: 1 % — ABNORMAL HIGH (ref 0–0)

## 2012-07-12 LAB — COMPREHENSIVE METABOLIC PANEL (CC13)
ALT: 25 U/L (ref 0–55)
Albumin: 3.1 g/dL — ABNORMAL LOW (ref 3.5–5.0)
CO2: 28 mEq/L (ref 22–29)
Potassium: 3 mEq/L — ABNORMAL LOW (ref 3.5–5.1)
Sodium: 142 mEq/L (ref 136–145)
Total Bilirubin: 0.68 mg/dL (ref 0.20–1.20)
Total Protein: 6.1 g/dL — ABNORMAL LOW (ref 6.4–8.3)

## 2012-07-12 MED ORDER — SODIUM CHLORIDE 0.9 % IJ SOLN
10.0000 mL | INTRAMUSCULAR | Status: DC | PRN
  Administered 2012-07-12: 10 mL
  Filled 2012-07-12: qty 10

## 2012-07-12 MED ORDER — TRASTUZUMAB CHEMO INJECTION 440 MG
2.0000 mg/kg | Freq: Once | INTRAVENOUS | Status: AC
  Administered 2012-07-12: 147 mg via INTRAVENOUS
  Filled 2012-07-12: qty 7

## 2012-07-12 MED ORDER — DIPHENHYDRAMINE HCL 25 MG PO CAPS
50.0000 mg | ORAL_CAPSULE | Freq: Once | ORAL | Status: AC
  Administered 2012-07-12: 50 mg via ORAL

## 2012-07-12 MED ORDER — ACETAMINOPHEN 325 MG PO TABS
650.0000 mg | ORAL_TABLET | Freq: Once | ORAL | Status: AC
  Administered 2012-07-12: 650 mg via ORAL

## 2012-07-12 MED ORDER — HEPARIN SOD (PORK) LOCK FLUSH 100 UNIT/ML IV SOLN
500.0000 [IU] | Freq: Once | INTRAVENOUS | Status: AC | PRN
  Administered 2012-07-12: 500 [IU]
  Filled 2012-07-12: qty 5

## 2012-07-12 MED ORDER — SODIUM CHLORIDE 0.9 % IV SOLN
Freq: Once | INTRAVENOUS | Status: AC
  Administered 2012-07-12: 14:00:00 via INTRAVENOUS

## 2012-07-12 NOTE — Progress Notes (Signed)
Hematology and Oncology Follow Up Visit  Alicia Pena [id number] [birth year] 42 y.o. 07/12/2012 2:21 PM   DIAGNOSIS:   Encounter Diagnoses  Name Primary?  . Cancer of upper-outer quadrant of female breast Yes  . Dehydration      PAST THERAPY:  T2N2a Her2+ breast cancer, s/p mastectomy, s/p TCH, cycle #4 day 8  Interim History: Alicia Pena is doing well today.  She's here for f/u for her weekly herceptin and tolerated her TCH c 4 relatively well last week.  She has experienced some muscle aches, and nasal drainage.  She has occasional palpitations, and is drinking 32 ounces of fluids per day.  Otherwise she is doing well and without questions/concerns.    Medications: Current Outpatient Prescriptions  Medication Sig Dispense Refill  . acetaminophen (TYLENOL) 325 MG tablet Take 650 mg by mouth every 6 (six) hours as needed.      . ALPRAZolam (XANAX) 0.25 MG tablet Take 1 tablet (0.25 mg total) by mouth at bedtime as needed for sleep.  10 tablet  0  . dexamethasone (DECADRON) 4 MG tablet TAKE 2 TABLETS BY MOUTH TWICE DAILY WITH MEALS THE DAY BEFORE CHEMO, THEN FOR 3 DAYS STARTING DAY AFTER CHEMO  30 tablet  0  . ibuprofen (ADVIL,MOTRIN) 200 MG tablet Take 200 mg by mouth every 6 (six) hours as needed.      . lidocaine-prilocaine (EMLA) cream Apply topically as needed.  30 g  2  . LORazepam (ATIVAN) 0.5 MG tablet Take 1 tablet (0.5 mg total) by mouth every 6 (six) hours as needed (Nausea or vomiting).  30 tablet  0  . LORazepam (ATIVAN) 1 MG tablet       . Multiple Vitamin (MULTIVITAMIN WITH MINERALS) TABS Take 1 tablet by mouth daily.      . ondansetron (ZOFRAN) 8 MG tablet Take 1 tablet (8 mg total) by mouth 2 (two) times daily. Take two times a day starting the day after chemo for 3 days. Then take two times a day as needed for nausea or vomiting.  30 tablet  1  . oxyCODONE-acetaminophen (ROXICET) 5-325 MG per tablet Take 1-2 tablets by mouth every 4 (four) hours as needed for pain.  30  tablet  0  . prochlorperazine (COMPAZINE) 10 MG tablet Take 1 tablet (10 mg total) by mouth every 6 (six) hours as needed (Nausea or vomiting).  30 tablet  1  . prochlorperazine (COMPAZINE) 25 MG suppository Place 1 suppository (25 mg total) rectally every 12 (twelve) hours as needed for nausea.  12 suppository  3   No current facility-administered medications for this visit.   Facility-Administered Medications Ordered in Other Visits  Medication Dose Route Frequency Provider Last Rate Last Dose  . heparin lock flush 100 unit/mL  500 Units Intracatheter Once PRN Victorino December, MD      . sodium chloride 0.9 % injection 10 mL  10 mL Intracatheter PRN Victorino December, MD      . trastuzumab (HERCEPTIN) 147 mg in sodium chloride 0.9 % 250 mL chemo infusion  2 mg/kg (Treatment Plan Actual) Intravenous Once Victorino December, MD         Allergies:  Allergies  Allergen Reactions  . Duricef (Cefadroxil) Hives and Nausea Only    Past Medical History, Surgical history, Social history, and Family History were reviewed and updated.  Review of Systems: General: fatigue (+), night sweats (-), fever (-), pain (-) Lymph: palpable nodes (-) HEENT: vision changes (-), mucositis (-),  gum bleeding (-), epistaxis (-) Cardiovascular: chest pain (-), palpitations (-) Pulmonary: shortness of breath (-), dyspnea on exertion (-), cough (-), hemoptysis (-) GI:  Early satiety (-), melena (-), dysphagia (-), nausea/vomiting (-), diarrhea (-) GU: dysuria (-), hematuria (-), incontinence (-) Musculoskeletal: joint swelling (-), joint pain (-), back pain (-) Neuro: weakness (-), numbness (-), headache (-), confusion (-) Skin: Rash (-), lesions (-), dryness (-) Psych: depression (-), suicidal/homicidal ideation (-), feeling of hopelessness (-)   Physical Exam:  Blood pressure 133/80, pulse 120, temperature 98.6 F (37 C), resp. rate 20, height 5' (1.524 m), weight 162 lb (73.483 kg). General: Patient is a well  appearing female in no acute distress HEENT: PERRLA, sclerae anicteric no conjunctival pallor, MMM Neck: supple, no palpable adenopathy Lungs: clear to auscultation bilaterally, no wheezes, rhonchi, or rales Cardiovascular: regular rate rhythm, S1, S2, no murmurs, rubs or gallops, HR 107 on EKG Abdomen: Soft, non-tender, non-distended, normoactive bowel sounds, no HSM Extremities: warm and well perfused, no clubbing, cyanosis, or edema Skin: No rashes or lesions Neuro: Non-focal ECOG: 1   ab Results: Lab Results  Component Value Date   WBC 7.9 07/12/2012   HGB 10.3* 07/12/2012   HCT 32.4* 07/12/2012   MCV 81.2 07/12/2012   PLT 158 Large platelets present 07/12/2012     Chemistry      Component Value Date/Time   NA 143 07/05/2012 1342   NA 138 04/02/2012 1100   K 3.7 07/05/2012 1342   K 3.9 04/02/2012 1100   CL 111* 07/05/2012 1342   CL 103 04/02/2012 1100   CO2 20* 07/05/2012 1342   CO2 25 04/02/2012 1100   BUN 16.0 07/05/2012 1342   BUN 16 04/02/2012 1100   CREATININE 0.7 07/05/2012 1342   CREATININE 0.49* 04/02/2012 1100      Component Value Date/Time   CALCIUM 9.2 07/05/2012 1342   CALCIUM 9.0 04/02/2012 1100   ALKPHOS 82 07/05/2012 1342   AST 15 07/05/2012 1342   ALT 23 07/05/2012 1342   BILITOT 0.69 07/05/2012 1342       Assessment and plan: A 42 y.o. female with Her-2/neu + breast cancer on Cycle 4 day 8 of TCH.    1. Breast cancer: doing well with treatment.  She will proceed with her weekly Herceptin today along with IV fluids.    2. Tachycardia.  In the exam room today her HR ranged from 107 to 130.  We got an EKG today and she was in sinus tach at 107.  She will receive IV fluids today as dehydration is a likely component.    3.  She will return next week for her weekly Herceptin.  Cherie Ouch Lyn Hollingshead, NP Medical Oncology [facility] Phone: [phone]   [place], [place] 1/10/20142:21 PM

## 2012-07-12 NOTE — Patient Instructions (Addendum)
[facility] Discharge Instructions for Patients Receiving Chemotherapy  Today you received the following chemotherapy agents :  Herceptin, IVF. To help prevent nausea and vomiting after your treatment, we encourage you to take your nausea medication as instructed by your physician.    If you develop nausea and vomiting that is not controlled by your nausea medication, call the clinic. If it is after clinic hours your family physician or the after hours number for the clinic or go to the Emergency Department.   BELOW ARE SYMPTOMS THAT SHOULD BE REPORTED IMMEDIATELY:  *FEVER GREATER THAN 100.5 F  *CHILLS WITH OR WITHOUT FEVER  NAUSEA AND VOMITING THAT IS NOT CONTROLLED WITH YOUR NAUSEA MEDICATION  *UNUSUAL SHORTNESS OF BREATH  *UNUSUAL BRUISING OR BLEEDING  TENDERNESS IN MOUTH AND THROAT WITH OR WITHOUT PRESENCE OF ULCERS  *URINARY PROBLEMS  *BOWEL PROBLEMS  UNUSUAL RASH Items with * indicate a potential emergency and should be followed up as soon as possible.  One of the nurses will contact you 24 hours after your treatment. Please let the nurse know about any problems that you may have experienced. Feel free to call the clinic you have any questions or concerns. The clinic phone number is [phone].   I have been informed and understand all the instructions given to me. I know to contact the clinic, my physician, or go to the Emergency Department if any problems should occur. I do not have any questions at this time, but understand that I may call the clinic during office hours   should I have any questions or need assistance in obtaining follow up care.    __________________________________________  _____________  __________ Signature of Patient or Authorized Representative            Date                   Time    __________________________________________ Nurse's Signature    

## 2012-07-12 NOTE — Patient Instructions (Addendum)
Proceed with Herceptin and IV fluids today.  Please call us if you have any questions or concerns.

## 2012-07-17 ENCOUNTER — Ambulatory Visit: Payer: PRIVATE HEALTH INSURANCE | Attending: Oncology | Admitting: Physical Therapy

## 2012-07-17 ENCOUNTER — Ambulatory Visit (HOSPITAL_COMMUNITY): Payer: PRIVATE HEALTH INSURANCE | Attending: Cardiovascular Disease | Admitting: Radiology

## 2012-07-17 DIAGNOSIS — M24519 Contracture, unspecified shoulder: Secondary | ICD-10-CM | POA: Insufficient documentation

## 2012-07-17 DIAGNOSIS — I379 Nonrheumatic pulmonary valve disorder, unspecified: Secondary | ICD-10-CM | POA: Insufficient documentation

## 2012-07-17 DIAGNOSIS — I369 Nonrheumatic tricuspid valve disorder, unspecified: Secondary | ICD-10-CM | POA: Insufficient documentation

## 2012-07-17 DIAGNOSIS — M25519 Pain in unspecified shoulder: Secondary | ICD-10-CM | POA: Insufficient documentation

## 2012-07-17 DIAGNOSIS — C50419 Malignant neoplasm of upper-outer quadrant of unspecified female breast: Secondary | ICD-10-CM

## 2012-07-17 DIAGNOSIS — I428 Other cardiomyopathies: Secondary | ICD-10-CM

## 2012-07-17 DIAGNOSIS — IMO0001 Reserved for inherently not codable concepts without codable children: Secondary | ICD-10-CM | POA: Insufficient documentation

## 2012-07-17 DIAGNOSIS — C50919 Malignant neoplasm of unspecified site of unspecified female breast: Secondary | ICD-10-CM | POA: Insufficient documentation

## 2012-07-17 DIAGNOSIS — Z5111 Encounter for antineoplastic chemotherapy: Secondary | ICD-10-CM

## 2012-07-17 DIAGNOSIS — I059 Rheumatic mitral valve disease, unspecified: Secondary | ICD-10-CM | POA: Insufficient documentation

## 2012-07-17 NOTE — Progress Notes (Signed)
Echocardiogram performed.  

## 2012-07-19 ENCOUNTER — Other Ambulatory Visit: Payer: PRIVATE HEALTH INSURANCE | Admitting: Lab

## 2012-07-19 ENCOUNTER — Ambulatory Visit: Payer: PRIVATE HEALTH INSURANCE

## 2012-07-19 ENCOUNTER — Ambulatory Visit (HOSPITAL_BASED_OUTPATIENT_CLINIC_OR_DEPARTMENT_OTHER): Payer: PRIVATE HEALTH INSURANCE

## 2012-07-19 ENCOUNTER — Ambulatory Visit (HOSPITAL_BASED_OUTPATIENT_CLINIC_OR_DEPARTMENT_OTHER): Payer: PRIVATE HEALTH INSURANCE | Admitting: Adult Health

## 2012-07-19 ENCOUNTER — Encounter: Payer: Self-pay | Admitting: Adult Health

## 2012-07-19 VITALS — BP 127/85 | HR 83 | Temp 97.9°F | Resp 20 | Ht 60.0 in | Wt 167.1 lb

## 2012-07-19 DIAGNOSIS — Z5112 Encounter for antineoplastic immunotherapy: Secondary | ICD-10-CM

## 2012-07-19 DIAGNOSIS — C50919 Malignant neoplasm of unspecified site of unspecified female breast: Secondary | ICD-10-CM

## 2012-07-19 DIAGNOSIS — C50419 Malignant neoplasm of upper-outer quadrant of unspecified female breast: Secondary | ICD-10-CM

## 2012-07-19 DIAGNOSIS — R000 Tachycardia, unspecified: Secondary | ICD-10-CM

## 2012-07-19 LAB — COMPREHENSIVE METABOLIC PANEL (CC13)
ALT: 36 U/L (ref 0–55)
AST: 17 U/L (ref 5–34)
Alkaline Phosphatase: 81 U/L (ref 40–150)
BUN: 21 mg/dL (ref 7.0–26.0)
Creatinine: 0.6 mg/dL (ref 0.6–1.1)
Total Bilirubin: 0.64 mg/dL (ref 0.20–1.20)

## 2012-07-19 LAB — CBC WITH DIFFERENTIAL/PLATELET
BASO%: 0.4 % (ref 0.0–2.0)
Basophils Absolute: 0 10*3/uL (ref 0.0–0.1)
EOS%: 0 % (ref 0.0–7.0)
Eosinophils Absolute: 0 10*3/uL (ref 0.0–0.5)
HCT: 28 % — ABNORMAL LOW (ref 34.8–46.6)
HGB: 8.7 g/dL — ABNORMAL LOW (ref 11.6–15.9)
LYMPH%: 24 % (ref 14.0–49.7)
MCH: 26.5 pg (ref 25.1–34.0)
MCHC: 31.1 g/dL — ABNORMAL LOW (ref 31.5–36.0)
MCV: 85.4 fL (ref 79.5–101.0)
MONO#: 0.4 10*3/uL (ref 0.1–0.9)
MONO%: 8.4 % (ref 0.0–14.0)
NEUT#: 3.4 10*3/uL (ref 1.5–6.5)
NEUT%: 67.2 % (ref 38.4–76.8)
Platelets: 97 10*3/uL — ABNORMAL LOW (ref 145–400)
RBC: 3.28 10*6/uL — ABNORMAL LOW (ref 3.70–5.45)
RDW: 25.7 % — ABNORMAL HIGH (ref 11.2–14.5)
WBC: 5.1 10*3/uL (ref 3.9–10.3)
lymph#: 1.2 10*3/uL (ref 0.9–3.3)
nRBC: 0 % (ref 0–0)

## 2012-07-19 MED ORDER — SODIUM CHLORIDE 0.9 % IJ SOLN
10.0000 mL | INTRAMUSCULAR | Status: DC | PRN
  Filled 2012-07-19: qty 10

## 2012-07-19 MED ORDER — DIPHENHYDRAMINE HCL 25 MG PO CAPS
50.0000 mg | ORAL_CAPSULE | Freq: Once | ORAL | Status: AC
  Administered 2012-07-19: 50 mg via ORAL

## 2012-07-19 MED ORDER — SODIUM CHLORIDE 0.9 % IV SOLN: Freq: Once | INTRAVENOUS | Status: DC

## 2012-07-19 MED ORDER — HEPARIN SOD (PORK) LOCK FLUSH 100 UNIT/ML IV SOLN
500.0000 [IU] | Freq: Once | INTRAVENOUS | Status: DC | PRN
  Filled 2012-07-19: qty 5

## 2012-07-19 MED ORDER — ACETAMINOPHEN 325 MG PO TABS
650.0000 mg | ORAL_TABLET | Freq: Once | ORAL | Status: AC
  Administered 2012-07-19: 650 mg via ORAL

## 2012-07-19 MED ORDER — TRASTUZUMAB CHEMO INJECTION 440 MG
2.0000 mg/kg | Freq: Once | INTRAVENOUS | Status: AC
  Administered 2012-07-19: 147 mg via INTRAVENOUS
  Filled 2012-07-19: qty 7

## 2012-07-19 NOTE — Progress Notes (Signed)
Ok for tx per dr note of 07/20/11.  dmr

## 2012-07-19 NOTE — Patient Instructions (Addendum)
[facility] Discharge Instructions for Patients Receiving Chemotherapy  Today you received the following chemotherapy agents herceptin  To help prevent nausea and vomiting after your treatment, we encourage you to take your nausea medication  and take it as often as prescribedIf you develop nausea and vomiting that is not controlled by your nausea medication, call the clinic. If it is after clinic hours your family physician or the after hours number for the clinic or go to the Emergency Department.   BELOW ARE SYMPTOMS THAT SHOULD BE REPORTED IMMEDIATELY:  *FEVER GREATER THAN 100.5 F  *CHILLS WITH OR WITHOUT FEVER  NAUSEA AND VOMITING THAT IS NOT CONTROLLED WITH YOUR NAUSEA MEDICATION  *UNUSUAL SHORTNESS OF BREATH  *UNUSUAL BRUISING OR BLEEDING  TENDERNESS IN MOUTH AND THROAT WITH OR WITHOUT PRESENCE OF ULCERS  *URINARY PROBLEMS  *BOWEL PROBLEMS  UNUSUAL RASH Items with * indicate a potential emergency and should be followed up as soon as possible.  One of the nurses will contact you 24 hours after your treatment. Please let the nurse know about any problems that you may have experienced. Feel free to call the clinic you have any questions or concerns. The clinic phone number is [phone].   I have been informed and understand all the instructions given to me. I know to contact the clinic, my physician, or go to the Emergency Department if any problems should occur. I do not have any questions at this time, but understand that I may call the clinic during office hours   should I have any questions or need assistance in obtaining follow up care.    __________________________________________  _____________  __________ Signature of Patient or Authorized Representative            Date                   Time    __________________________________________ Nurse's Signature    

## 2012-07-19 NOTE — Progress Notes (Signed)
Hematology and Oncology Follow Up Visit  Alicia Pena [id number] 21-Feb-1971 42 y.o. 07/19/2012 10:08 AM   DIAGNOSIS:   Encounter Diagnosis  Name Primary?  . Cancer of upper-outer quadrant of female breast Yes     PAST THERAPY:  T2N2a Her2+ breast cancer, s/p mastectomy, s/p TCH, cycle #4 day 15  Interim History: Alicia Pena is doing well today. She's here for her weekly Herceptin.  She had an echo on 1/15 that showed an EF of 55-60%.  She's c/o some lower ext. Swelling and weight gain.  She isn't sure if her diet has a lot of sodium or not.  Otherwise, she's feeling well and w/o questions/concerns.  A 10 point ROS is neg.   Medications: Current Outpatient Prescriptions  Medication Sig Dispense Refill  . acetaminophen (TYLENOL) 325 MG tablet Take 650 mg by mouth every 6 (six) hours as needed.      . ALPRAZolam (XANAX) 0.25 MG tablet Take 1 tablet (0.25 mg total) by mouth at bedtime as needed for sleep.  10 tablet  0  . dexamethasone (DECADRON) 4 MG tablet TAKE 2 TABLETS BY MOUTH TWICE DAILY WITH MEALS THE DAY BEFORE CHEMO, THEN FOR 3 DAYS STARTING DAY AFTER CHEMO  30 tablet  0  . lidocaine-prilocaine (EMLA) cream Apply topically as needed.  30 g  2  . LORazepam (ATIVAN) 0.5 MG tablet Take 1 tablet (0.5 mg total) by mouth every 6 (six) hours as needed (Nausea or vomiting).  30 tablet  0  . LORazepam (ATIVAN) 1 MG tablet       . Multiple Vitamin (MULTIVITAMIN WITH MINERALS) TABS Take 1 tablet by mouth daily.      . ondansetron (ZOFRAN) 8 MG tablet Take 1 tablet (8 mg total) by mouth 2 (two) times daily. Take two times a day starting the day after chemo for 3 days. Then take two times a day as needed for nausea or vomiting.  30 tablet  1  . prochlorperazine (COMPAZINE) 10 MG tablet Take 1 tablet (10 mg total) by mouth every 6 (six) hours as needed (Nausea or vomiting).  30 tablet  1  . prochlorperazine (COMPAZINE) 25 MG suppository Place 1 suppository (25 mg total) rectally every 12 (twelve)  hours as needed for nausea.  12 suppository  3  . ibuprofen (ADVIL,MOTRIN) 200 MG tablet Take 200 mg by mouth every 6 (six) hours as needed.      Marland Kitchen oxyCODONE-acetaminophen (ROXICET) 5-325 MG per tablet Take 1-2 tablets by mouth every 4 (four) hours as needed for pain.  30 tablet  0     Allergies:  Allergies  Allergen Reactions  . Duricef (Cefadroxil) Hives and Nausea Only    Past Medical History, Surgical history, Social history, and Family History were reviewed and updated.  Review of Systems: General: fatigue (+), night sweats (-), fever (-), pain (-) Lymph: palpable nodes (-) HEENT: vision changes (-), mucositis (-), gum bleeding (-), epistaxis (-) Cardiovascular: chest pain (-), palpitations (-) Pulmonary: shortness of breath (-), dyspnea on exertion (-), cough (-), hemoptysis (-) GI:  Early satiety (-), melena (-), dysphagia (-), nausea/vomiting (-), diarrhea (-) GU: dysuria (-), hematuria (-), incontinence (-) Musculoskeletal: joint swelling (-), joint pain (-), back pain (-) Neuro: weakness (-), numbness (-), headache (-), confusion (-) Skin: Rash (-), lesions (-), dryness (-) Psych: depression (-), suicidal/homicidal ideation (-), feeling of hopelessness (-)   Physical Exam:  Blood pressure 127/85, pulse 83, temperature 97.9 F (36.6 C), resp. rate 20, height 5' (  1.524 m), weight 167 lb 1.6 oz (75.796 kg). General: Patient is a well appearing female in no acute distress HEENT: PERRLA, sclerae anicteric no conjunctival pallor, MMM Neck: supple, no palpable adenopathy Lungs: clear to auscultation bilaterally, no wheezes, rhonchi, or rales Cardiovascular: regular rate rhythm, S1, S2, no murmurs, rubs or gallops, HR 107 on EKG Abdomen: Soft, non-tender, non-distended, normoactive bowel sounds, no HSM Extremities: warm and well perfused, no clubbing, cyanosis, scant BLE edema Skin: No rashes or lesions Neuro: Non-focal ECOG: 1   ab Results: Lab Results  Component  Value Date   WBC 5.1 07/19/2012   HGB 8.7* 07/19/2012   HCT 28.0* 07/19/2012   MCV 85.4 07/19/2012   PLT 97* 07/19/2012     Chemistry      Component Value Date/Time   NA 142 07/12/2012 1148   NA 138 04/02/2012 1100   K 3.0* 07/12/2012 1148   K 3.9 04/02/2012 1100   CL 105 07/12/2012 1148   CL 103 04/02/2012 1100   CO2 28 07/12/2012 1148   CO2 25 04/02/2012 1100   BUN 15.0 07/12/2012 1148   BUN 16 04/02/2012 1100   CREATININE 0.7 07/12/2012 1148   CREATININE 0.49* 04/02/2012 1100      Component Value Date/Time   CALCIUM 8.5 07/12/2012 1148   CALCIUM 9.0 04/02/2012 1100   ALKPHOS 94 07/12/2012 1148   AST 18 07/12/2012 1148   ALT 25 07/12/2012 1148   BILITOT 0.68 07/12/2012 1148       Assessment and plan: A 42 y.o. female with Her-2/neu + breast cancer on Cycle 4 day 8 of TCH.    1. Breast cancer: doing well with treatment.  She will proceed with her weekly Herceptin today.  2. Tachycardia. This has resolved since she received IV fluids.  She had an echo on 1/15 that showed an EF of 55-65%   3.  She will return next week for [facility].   Cherie Ouch Lyn Hollingshead, NP Medical Oncology [facility] Phone: [phone]   [place], [place] 1/17/201410:08 AM

## 2012-07-19 NOTE — Patient Instructions (Signed)
Doing well.  Echo was normal.  Proceed with Herceptin today.  We will see you next week for chemotherapy.  Please call us if you have any questions or concerns.

## 2012-07-21 NOTE — Progress Notes (Signed)
OFFICE PROGRESS NOTE  CC  Alicia Pena, [name], MD [address] [place] [address] Dr. Emelia Loron Dr. 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 Originally diagnosed in September 2013.  PRIOR THERAPY:  #1Patient originally presented in September 2013 when she noticed dimpling within the left breast. She subsequently had a mammogram that showed suspicious findings. Further workup showed multiple areas of concern. There was noted to be a 2.8 cm spiculated mass within the upper outer quadrant of the left breast. The pathology did show invasive ductal carcinoma from the upper-outer quadrant of the left breast. There was also noted to be a 9 mm enhancing mass which was 2.2 cm anterior to the known area of malignancy. A third mass measuring 8 mm was 2.1 cm anterior to the known area of malignancy. Also a satellite spiculated mass was present 8 mm and a 4 mm rounded mass in the 12:00. She did have additional biopsies and was diagnosed With multicentric disease. There were also noted to be couple of small areas of concern in the right breast but these were negative for malignancy on biopsy.  #2 patient ultimately proceeded to undergo simple mastectomy on the left with sentinel lymph node biopsy on 03/21/2012. The final pathology within the left breast showed multifocal invasive and in situ ductal carcinoma. 4 distinct areas were seen measuring 3.2 cm, 2.3 cm, 1.3 cm, and 0.8 cm. All margins were negative. There was focal lymphatic vascular involvement and normal involvement by tumor. 2 sentinel nodes were positive for metastatic carcinoma with extracapsular extension. The tumors were ER positive PR positive and HER-2/neu positive.  #3 patient did undergo axillary lymph node dissection of the left axilla On 04/04/2012 additional 8 lymph nodes were removed. I have those 3 were positive therefore patient had 5/10 lymph nodes  positive for metastatic disease. With the final pathologic staging at T2 N2a.  #4 patient was begun on adjuvant systemic chemotherapy by Dr. Pierce Crane consisting of Taxotere carboplatinum given every 21 days with Herceptin given on oh weekly basis for the duration of Taxotere and carbo platinum. Her chemotherapy began On 05/03/2012. A total of 6 cycles of TCH combination is planned.  #5 after completion of chemotherapy she will begin radiation therapy and she has RE been seen by Dr. Dorothy Puffer.  CURRENT THERAPY:Patient is here for cycle 4 day 1 of TCH combination.  INTERVAL HISTORY: Alicia Pena 42 y.o. female returns forFollowup visit prior to her chemotherapy today. Overall she is tolerating the chemotherapy well. She does have dry mouth she has no sores. She denies any hematuria hematochezia melena hemoptysis hematemesis no epistaxis. She denies any nausea vomiting fevers chills night sweats no peripheral paresthesias.No myalgias and arthralgias. Remainder of the 10 point review of systems is negative.  MEDICAL HISTORY: Past Medical History  Diagnosis Date  . Anemia   . Anxiety     does not like needles ..   . 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 Breast cancer 03/21/2012    left multifocal invasive, in situ ca,margins not involved,ER/PR=+,metastatic (1/1) node  . Allergy   . Complication of anesthesia     ALLERGIES:  is allergic to duricef.  MEDICATIONS:  Current Outpatient Prescriptions  Medication Sig Dispense Refill  . lidocaine-prilocaine (EMLA) cream Apply topically as needed.  30 g  2  . acetaminophen (TYLENOL) 325 MG tablet Take 650 mg by mouth every 6 (six) hours  as needed.      . ALPRAZolam (XANAX) 0.25 MG tablet Take 1 tablet (0.25 mg total) by mouth at bedtime as needed for sleep.  10 tablet  0  . dexamethasone (DECADRON) 4 MG tablet TAKE 2 TABLETS BY MOUTH TWICE DAILY WITH MEALS THE DAY BEFORE CHEMO, THEN FOR 3 DAYS STARTING DAY AFTER CHEMO  30 tablet  0  . ibuprofen  (ADVIL,MOTRIN) 200 MG tablet Take 200 mg by mouth every 6 (six) hours as needed.      Marland Kitchen LORazepam (ATIVAN) 0.5 MG tablet Take 1 tablet (0.5 mg total) by mouth every 6 (six) hours as needed (Nausea or vomiting).  30 tablet  0  . LORazepam (ATIVAN) 1 MG tablet       . Multiple Vitamin (MULTIVITAMIN WITH MINERALS) TABS Take 1 tablet by mouth daily.      . ondansetron (ZOFRAN) 8 MG tablet Take 1 tablet (8 mg total) by mouth 2 (two) times daily. Take two times a day starting the day after chemo for 3 days. Then take two times a day as needed for nausea or vomiting.  30 tablet  1  . oxyCODONE-acetaminophen (ROXICET) 5-325 MG per tablet Take 1-2 tablets by mouth every 4 (four) hours as needed for pain.  30 tablet  0  . prochlorperazine (COMPAZINE) 10 MG tablet Take 1 tablet (10 mg total) by mouth every 6 (six) hours as needed (Nausea or vomiting).  30 tablet  1  . prochlorperazine (COMPAZINE) 25 MG suppository Place 1 suppository (25 mg total) rectally every 12 (twelve) hours as needed for nausea.  12 suppository  3    SURGICAL HISTORY:  Past Surgical History  Procedure Date  . Leg surgery     LLE  . Eye surgery     as a child  . Mastectomy complete / simple w/ sentinel node biopsy 03/21/2012    left Dr.Matthew Dwain Sarna  . Breast biopsy 89/07/2011    left breast uoq bx=invasive ductal ca  . Breast biopsy bi lateral 02/15/12    left = invasive ductal, right breast= fibroadenoma,fibrocystic changes,no atypia,hyperlasia or malignancy identified  . Abdominal hysterectomy 2006    w/o oophorectomy  . Axillary lymph node dissection 04/04/2012    Procedure: AXILLARY LYMPH NODE DISSECTION;  Surgeon: Emelia Loron, MD;  Location: [facility];  Service: General;  Laterality: Left;  Left Axillary lymph node dissection, Port a cath placement  . Portacath placement 04/04/2012    Procedure: INSERTION PORT-A-CATH;  Surgeon: Emelia Loron, MD;  Location: [facility];  Service:  General;  Laterality: Right;    REVIEW OF SYSTEMS:  Pertinent items are noted in HPI.   HEALTH MAINTENANCE:   PHYSICAL EXAMINATION: Blood pressure 163/95, pulse 114, temperature 97.7 F (36.5 C), resp. rate 20, height 5' (1.524 m), weight 164 lb (74.39 kg). Body mass index is 32.03 kg/(m^2). ECOG PERFORMANCE STATUS: 1 - Symptomatic but completely ambulatory   General appearance: alert, cooperative and appears stated age Lymph nodes: Cervical, supraclavicular, and axillary nodes normal. Resp: clear to auscultation bilaterally Back: symmetric, no curvature. ROM normal. No CVA tenderness. Cardio: regular rate and rhythm GI: soft, non-tender; bowel sounds normal; no masses,  no organomegaly Extremities: extremities normal, atraumatic, no cyanosis or edema Neurologic: Grossly normal   LABORATORY DATA: Lab Results  Component Value Date   WBC 5.1 07/19/2012   HGB 8.7* 07/19/2012   HCT 28.0* 07/19/2012   MCV 85.4 07/19/2012   PLT 97* 07/19/2012  Chemistry      Component Value Date/Time   NA 142 07/19/2012 0901   NA 138 04/02/2012 1100   K 3.5 07/19/2012 0901   K 3.9 04/02/2012 1100   CL 108* 07/19/2012 0901   CL 103 04/02/2012 1100   CO2 24 07/19/2012 0901   CO2 25 04/02/2012 1100   BUN 21.0 07/19/2012 0901   BUN 16 04/02/2012 1100   CREATININE 0.6 07/19/2012 0901   CREATININE 0.49* 04/02/2012 1100      Component Value Date/Time   CALCIUM 8.8 07/19/2012 0901   CALCIUM 9.0 04/02/2012 1100   ALKPHOS 81 07/19/2012 0901   AST 17 07/19/2012 0901   ALT 36 07/19/2012 0901   BILITOT 0.64 07/19/2012 0901       RADIOGRAPHIC STUDIES:  No results found.  ASSESSMENT: 42 year old female with  #1 T2 N2 A. Invasive ductal carcinoma of the left breast that was multifocal she is status post mastectomy with axillary lymph node dissection in September October 2013. Patient's tumor was ER positive PR positive HER-2/neu positive.  #2 she is currently receiving adjuvant chemotherapy consisting of  Taxotere carboplatinum and Herceptin total of 6 cycles of therapy is planned. She is here for cycle #4 Of [facility] today . Overall she is tolerating her treatment quite nicely.  #3 patient will need an echocardiogram and I have set this up for her.  #4 patient is complaining of increasing weakness in her lower extremities. She does have underlying cerebral palsy. And she feels since starting her chemotherapy she has had more weakness. I will refer her to physical therapy.   PLAN:   #1 patient to proceed with cycle 4 of TCH today.  #2 echocardiogram to be set up today.  #3 referral to physical therapy.   All questions were answered. The patient knows to call the clinic with any problems, questions or concerns. We can certainly see the patient much sooner if necessary.  I spent 30 minutes counseling the patient face to face. The total time spent in the appointment was 30 minutes.    Drue Second, MD Medical/Oncology [facility] [phone] (beeper) [phone] (Office)  07/21/2012, 12:32 PM

## 2012-07-24 ENCOUNTER — Encounter: Payer: PRIVATE HEALTH INSURANCE | Admitting: Physical Therapy

## 2012-07-26 ENCOUNTER — Ambulatory Visit (HOSPITAL_BASED_OUTPATIENT_CLINIC_OR_DEPARTMENT_OTHER): Payer: PRIVATE HEALTH INSURANCE

## 2012-07-26 ENCOUNTER — Ambulatory Visit (HOSPITAL_BASED_OUTPATIENT_CLINIC_OR_DEPARTMENT_OTHER): Payer: PRIVATE HEALTH INSURANCE | Admitting: Adult Health

## 2012-07-26 ENCOUNTER — Encounter: Payer: Self-pay | Admitting: Adult Health

## 2012-07-26 ENCOUNTER — Ambulatory Visit: Payer: PRIVATE HEALTH INSURANCE | Admitting: Oncology

## 2012-07-26 ENCOUNTER — Ambulatory Visit: Payer: PRIVATE HEALTH INSURANCE

## 2012-07-26 ENCOUNTER — Other Ambulatory Visit: Payer: PRIVATE HEALTH INSURANCE | Admitting: Lab

## 2012-07-26 ENCOUNTER — Ambulatory Visit: Payer: PRIVATE HEALTH INSURANCE | Admitting: Adult Health

## 2012-07-26 ENCOUNTER — Other Ambulatory Visit (HOSPITAL_BASED_OUTPATIENT_CLINIC_OR_DEPARTMENT_OTHER): Payer: PRIVATE HEALTH INSURANCE | Admitting: Lab

## 2012-07-26 VITALS — BP 145/96 | HR 102 | Temp 98.2°F | Resp 20 | Ht 60.0 in | Wt 169.4 lb

## 2012-07-26 DIAGNOSIS — Z5112 Encounter for antineoplastic immunotherapy: Secondary | ICD-10-CM

## 2012-07-26 DIAGNOSIS — C50419 Malignant neoplasm of upper-outer quadrant of unspecified female breast: Secondary | ICD-10-CM

## 2012-07-26 DIAGNOSIS — C50919 Malignant neoplasm of unspecified site of unspecified female breast: Secondary | ICD-10-CM

## 2012-07-26 DIAGNOSIS — M6281 Muscle weakness (generalized): Secondary | ICD-10-CM

## 2012-07-26 DIAGNOSIS — R5381 Other malaise: Secondary | ICD-10-CM

## 2012-07-26 DIAGNOSIS — C773 Secondary and unspecified malignant neoplasm of axilla and upper limb lymph nodes: Secondary | ICD-10-CM

## 2012-07-26 LAB — CBC WITH DIFFERENTIAL/PLATELET
Eosinophils Absolute: 0 10*3/uL (ref 0.0–0.5)
HGB: 9.2 g/dL — ABNORMAL LOW (ref 11.6–15.9)
MONO#: 1.1 10*3/uL — ABNORMAL HIGH (ref 0.1–0.9)
MONO%: 7.7 % (ref 0.0–14.0)
NEUT#: 11.6 10*3/uL — ABNORMAL HIGH (ref 1.5–6.5)
RBC: 3.39 10*6/uL — ABNORMAL LOW (ref 3.70–5.45)
RDW: 25.8 % — ABNORMAL HIGH (ref 11.2–14.5)
WBC: 13.7 10*3/uL — ABNORMAL HIGH (ref 3.9–10.3)
lymph#: 1 10*3/uL (ref 0.9–3.3)

## 2012-07-26 LAB — COMPREHENSIVE METABOLIC PANEL (CC13)
ALT: 33 U/L (ref 0–55)
AST: 22 U/L (ref 5–34)
BUN: 20.5 mg/dL (ref 7.0–26.0)
CO2: 20 mEq/L — ABNORMAL LOW (ref 22–29)
Calcium: 9.3 mg/dL (ref 8.4–10.4)
Creatinine: 0.6 mg/dL (ref 0.6–1.1)
Total Bilirubin: 0.66 mg/dL (ref 0.20–1.20)

## 2012-07-26 LAB — CK: Total CK: 30 U/L (ref 7–177)

## 2012-07-26 MED ORDER — ACETAMINOPHEN 325 MG PO TABS
650.0000 mg | ORAL_TABLET | Freq: Once | ORAL | Status: AC
  Administered 2012-07-26: 650 mg via ORAL

## 2012-07-26 MED ORDER — HEPARIN SOD (PORK) LOCK FLUSH 100 UNIT/ML IV SOLN
500.0000 [IU] | Freq: Once | INTRAVENOUS | Status: AC | PRN
  Administered 2012-07-26: 500 [IU]
  Filled 2012-07-26: qty 5

## 2012-07-26 MED ORDER — SODIUM CHLORIDE 0.9 % IJ SOLN
10.0000 mL | INTRAMUSCULAR | Status: DC | PRN
  Administered 2012-07-26: 10 mL
  Filled 2012-07-26: qty 10

## 2012-07-26 MED ORDER — DIPHENHYDRAMINE HCL 25 MG PO CAPS
50.0000 mg | ORAL_CAPSULE | Freq: Once | ORAL | Status: AC
  Administered 2012-07-26: 50 mg via ORAL

## 2012-07-26 MED ORDER — TRASTUZUMAB CHEMO INJECTION 440 MG
2.0000 mg/kg | Freq: Once | INTRAVENOUS | Status: AC
  Administered 2012-07-26: 147 mg via INTRAVENOUS
  Filled 2012-07-26: qty 7

## 2012-07-26 MED ORDER — SODIUM CHLORIDE 0.9 % IV SOLN
Freq: Once | INTRAVENOUS | Status: AC
  Administered 2012-07-26: 14:00:00 via INTRAVENOUS

## 2012-07-26 NOTE — Patient Instructions (Addendum)
[facility] Discharge Instructions for Patients Receiving Chemotherapy  Today you received the following chemotherapy agents Herceptin.  To help prevent nausea and vomiting after your treatment, we encourage you to take your nausea medication as prescribed.   If you develop nausea and vomiting that is not controlled by your nausea medication, call the clinic. If it is after clinic hours your family physician or the after hours number for the clinic or go to the Emergency Department.   BELOW ARE SYMPTOMS THAT SHOULD BE REPORTED IMMEDIATELY:  *FEVER GREATER THAN 100.5 F  *CHILLS WITH OR WITHOUT FEVER  NAUSEA AND VOMITING THAT IS NOT CONTROLLED WITH YOUR NAUSEA MEDICATION  *UNUSUAL SHORTNESS OF BREATH  *UNUSUAL BRUISING OR BLEEDING  TENDERNESS IN MOUTH AND THROAT WITH OR WITHOUT PRESENCE OF ULCERS  *URINARY PROBLEMS  *BOWEL PROBLEMS  UNUSUAL RASH Items with * indicate a potential emergency and should be followed up as soon as possible.  Feel free to call the clinic you have any questions or concerns. The clinic phone number is [phone].   I have been informed and understand all the instructions given to me. I know to contact the clinic, my physician, or go to the Emergency Department if any problems should occur. I do not have any questions at this time, but understand that I may call the clinic during office hours   should I have any questions or need assistance in obtaining follow up care.    __________________________________________  _____________  __________ Signature of Patient or Authorized Representative            Date                   Time    __________________________________________ Nurse's Signature    

## 2012-07-26 NOTE — Patient Instructions (Addendum)
Proceed with Herceptin only today.  Continue with physical therapy.  Please call us if you have any questions or concerns.

## 2012-07-26 NOTE — Progress Notes (Signed)
OFFICE PROGRESS NOTE  CC  [name], [name], MD [address] [place] [address] Dr. Emelia Loron Dr. 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 Originally diagnosed in September 2013.  PRIOR THERAPY:  #1Patient originally presented in September 2013 when she noticed dimpling within the left breast. She subsequently had a mammogram that showed suspicious findings. Further workup showed multiple areas of concern. There was noted to be a 2.8 cm spiculated mass within the upper outer quadrant of the left breast. The pathology did show invasive ductal carcinoma from the upper-outer quadrant of the left breast. There was also noted to be a 9 mm enhancing mass which was 2.2 cm anterior to the known area of malignancy. A third mass measuring 8 mm was 2.1 cm anterior to the known area of malignancy. Also a satellite spiculated mass was present 8 mm and a 4 mm rounded mass in the 12:00. She did have additional biopsies and was diagnosed With multicentric disease. There were also noted to be couple of small areas of concern in the right breast but these were negative for malignancy on biopsy.  #2 patient ultimately proceeded to undergo simple mastectomy on the left with sentinel lymph node biopsy on 03/21/2012. The final pathology within the left breast showed multifocal invasive and in situ ductal carcinoma. 4 distinct areas were seen measuring 3.2 cm, 2.3 cm, 1.3 cm, and 0.8 cm. All margins were negative. There was focal lymphatic vascular involvement and normal involvement by tumor. 2 sentinel nodes were positive for metastatic carcinoma with extracapsular extension. The tumors were ER positive PR positive and HER-2/neu positive.  #3 patient did undergo axillary lymph node dissection of the left axilla On 04/04/2012 additional 8 lymph nodes were removed. I have those 3 were positive therefore patient had 5/10 lymph nodes  positive for metastatic disease. With the final pathologic staging at T2 N2a.  #4 patient was begun on adjuvant systemic chemotherapy by Dr. Pierce Crane consisting of Taxotere carboplatinum given every 21 days with Herceptin given on oh weekly basis for the duration of Taxotere and carbo platinum. Her chemotherapy began On 05/03/2012. A total of 6 cycles of TCH combination is planned.  #5 after completion of chemotherapy she will begin radiation therapy and she has RE been seen by Dr. Dorothy Puffer.  CURRENT THERAPY:Patient is here for cycle 5 day 1 of TCH combination.  INTERVAL HISTORY: Alicia Pena 42 y.o. female returns forFollowup visit prior to her chemotherapy today.  She is here in a wheelchair today.  She has been complaining of weakness previously, but today she says she almost fell out of the car and doesn't have the muscle strength to walk.  She did go and see physical therapy for an evaluation last week, she missed her treatment due to the weather, and has her next appt on Monday.  She feels like her weakness is continuing to worsen, and PT told her that it was a combination of the chemotherapy, the fact that she hasn't been to therapy in years, and her weight gain.  Otherwise, she denies fevers, chills, pain, numbness, or any other concerns.    MEDICAL HISTORY: Past Medical History  Diagnosis Date  . Anemia   . Anxiety     does not like needles ..   . 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 Breast cancer 03/21/2012    left multifocal invasive, in situ ca,margins not involved,ER/PR=+,metastatic (  1/1) node  . Allergy   . Complication of anesthesia     ALLERGIES:  is allergic to duricef.  MEDICATIONS:  Current Outpatient Prescriptions  Medication Sig Dispense Refill  . acetaminophen (TYLENOL) 325 MG tablet Take 650 mg by mouth every 6 (six) hours as needed.      . ALPRAZolam (XANAX) 0.25 MG tablet Take 1 tablet (0.25 mg total) by mouth at bedtime as needed for sleep.  10 tablet  0  .  dexamethasone (DECADRON) 4 MG tablet TAKE 2 TABLETS BY MOUTH TWICE DAILY WITH MEALS THE DAY BEFORE CHEMO, THEN FOR 3 DAYS STARTING DAY AFTER CHEMO  30 tablet  0  . ibuprofen (ADVIL,MOTRIN) 200 MG tablet Take 200 mg by mouth every 6 (six) hours as needed.      . lidocaine-prilocaine (EMLA) cream Apply topically as needed.  30 g  2  . LORazepam (ATIVAN) 0.5 MG tablet Take 1 tablet (0.5 mg total) by mouth every 6 (six) hours as needed (Nausea or vomiting).  30 tablet  0  . LORazepam (ATIVAN) 1 MG tablet       . Multiple Vitamin (MULTIVITAMIN WITH MINERALS) TABS Take 1 tablet by mouth daily.      . ondansetron (ZOFRAN) 8 MG tablet Take 1 tablet (8 mg total) by mouth 2 (two) times daily. Take two times a day starting the day after chemo for 3 days. Then take two times a day as needed for nausea or vomiting.  30 tablet  1  . oxyCODONE-acetaminophen (ROXICET) 5-325 MG per tablet Take 1-2 tablets by mouth every 4 (four) hours as needed for pain.  30 tablet  0  . prochlorperazine (COMPAZINE) 10 MG tablet Take 1 tablet (10 mg total) by mouth every 6 (six) hours as needed (Nausea or vomiting).  30 tablet  1  . prochlorperazine (COMPAZINE) 25 MG suppository Place 1 suppository (25 mg total) rectally every 12 (twelve) hours as needed for nausea.  12 suppository  3   No current facility-administered medications for this visit.   Facility-Administered Medications Ordered in Other Visits  Medication Dose Route Frequency Provider Last Rate Last Dose  . heparin lock flush 100 unit/mL  500 Units Intracatheter Once PRN Victorino December, MD      . sodium chloride 0.9 % injection 10 mL  10 mL Intracatheter PRN Victorino December, MD      . trastuzumab (HERCEPTIN) 147 mg in sodium chloride 0.9 % 250 mL chemo infusion  2 mg/kg (Treatment Plan Actual) Intravenous Once Victorino December, MD 514 mL/hr at 07/26/12 1440 147 mg at 07/26/12 1440    SURGICAL HISTORY:  Past Surgical History  Procedure Date  . Leg surgery     LLE  .  Eye surgery     as a child  . Mastectomy complete / simple w/ sentinel node biopsy 03/21/2012    left Dr.Matthew Dwain Sarna  . Breast biopsy 89/07/2011    left breast uoq bx=invasive ductal ca  . Breast biopsy bi lateral 02/15/12    left = invasive ductal, right breast= fibroadenoma,fibrocystic changes,no atypia,hyperlasia or malignancy identified  . Abdominal hysterectomy 2006    w/o oophorectomy  . Axillary lymph node dissection 04/04/2012    Procedure: AXILLARY LYMPH NODE DISSECTION;  Surgeon: Emelia Loron, MD;  Location: [facility];  Service: General;  Laterality: Left;  Left Axillary lymph node dissection, Port a cath placement  . Portacath placement 04/04/2012    Procedure: INSERTION PORT-A-CATH;  Surgeon: Emelia Loron,  MD;  Location: [facility];  Service: General;  Laterality: Right;    REVIEW OF SYSTEMS:   General: fatigue (-), night sweats (-), fever (-), pain (-) Lymph: palpable nodes (-) HEENT: vision changes (-), mucositis (-), gum bleeding (-), epistaxis (-) Cardiovascular: chest pain (-), palpitations (-) Pulmonary: shortness of breath (-), dyspnea on exertion (-), cough (-), hemoptysis (-) GI:  Early satiety (-), melena (-), dysphagia (-), nausea/vomiting (-), diarrhea (-) GU: dysuria (-), hematuria (-), incontinence (-) Musculoskeletal: joint swelling (-), joint pain (-), back pain (-) Neuro: weakness (+), numbness (-), headache (-), confusion (-) Skin: Rash (-), lesions (-), dryness (-) Psych: depression (-), suicidal/homicidal ideation (-), feeling of hopelessness (-)    PHYSICAL EXAMINATION: Blood pressure 145/96, pulse 102, temperature 98.2 F (36.8 C), temperature source Oral, resp. rate 20, height 5' (1.524 m), weight 169 lb 6.4 oz (76.839 kg). Body mass index is 33.08 kg/(m^2). General: Patient is a well appearing female in no acute distress HEENT: PERRLA, sclerae anicteric no conjunctival pallor, MMM Neck: supple, no  palpable adenopathy Lungs: clear to auscultation bilaterally, no wheezes, rhonchi, or rales Cardiovascular: regular rate rhythm, S1, S2, no murmurs, rubs or gallops Abdomen: Soft, non-tender, non-distended, normoactive bowel sounds, no HSM Extremities: warm and well perfused, no clubbing, cyanosis, or edema Skin: No rashes or lesions Neuro: Non-focal ECOG PERFORMANCE STATUS: 1 - Symptomatic but completely ambulatory   LABORATORY DATA: Lab Results  Component Value Date   WBC 13.7* 07/26/2012   HGB 9.2* 07/26/2012   HCT 29.0* 07/26/2012   MCV 85.5 07/26/2012   PLT 108* 07/26/2012      Chemistry      Component Value Date/Time   NA 142 07/19/2012 0901   NA 138 04/02/2012 1100   K 3.5 07/19/2012 0901   K 3.9 04/02/2012 1100   CL 108* 07/19/2012 0901   CL 103 04/02/2012 1100   CO2 24 07/19/2012 0901   CO2 25 04/02/2012 1100   BUN 21.0 07/19/2012 0901   BUN 16 04/02/2012 1100   CREATININE 0.6 07/19/2012 0901   CREATININE 0.49* 04/02/2012 1100      Component Value Date/Time   CALCIUM 8.8 07/19/2012 0901   CALCIUM 9.0 04/02/2012 1100   ALKPHOS 81 07/19/2012 0901   AST 17 07/19/2012 0901   ALT 36 07/19/2012 0901   BILITOT 0.64 07/19/2012 0901       RADIOGRAPHIC STUDIES:  No results found.  ASSESSMENT: 42 year old female with  #1 T2 N2 A. Invasive ductal carcinoma of the left breast that was multifocal she is status post mastectomy with axillary lymph node dissection in September October 2013. Patient's tumor was ER positive PR positive HER-2/neu positive.  #2 she is currently receiving adjuvant chemotherapy consisting of Taxotere carboplatinum and Herceptin total of 6 cycles of therapy is planned. She is here for cycle #4 [facility] today . Overall she is tolerating her treatment quite nicely.  #3 patient will need an echocardiogram-1/15 showed EF of 55-60%.  #4 patient is complaining of increasing weakness in her lower extremities. She does have underlying cerebral palsy. And she feels since  starting her chemotherapy she has had more weakness. I will refer her to physical therapy.   PLAN:   #1 Proceed with Herceptin only today.  We will hold the Taxotere/Carboplatinum and give her a few weeks to regain her strength and re-evaluate on February 14.  She will continue to receive weekly Herceptin.    #2 We will see her back next week  for weekly Herceptin.  #3 Continue to physical therapy.   All questions were answered. The patient knows to call the clinic with any problems, questions or concerns. We can certainly see the patient much sooner if necessary.  I spent 25 minutes counseling the patient face to face. The total time spent in the appointment was 30 minutes.  Cherie Ouch Lyn Hollingshead, NP Medical Oncology [facility] Phone: [phone]    07/26/2012, 2:46 PM

## 2012-07-27 ENCOUNTER — Ambulatory Visit: Payer: PRIVATE HEALTH INSURANCE

## 2012-07-29 ENCOUNTER — Ambulatory Visit: Payer: PRIVATE HEALTH INSURANCE | Admitting: Physical Therapy

## 2012-07-31 ENCOUNTER — Ambulatory Visit: Payer: PRIVATE HEALTH INSURANCE | Admitting: Physical Therapy

## 2012-08-02 ENCOUNTER — Ambulatory Visit (HOSPITAL_BASED_OUTPATIENT_CLINIC_OR_DEPARTMENT_OTHER): Payer: PRIVATE HEALTH INSURANCE

## 2012-08-02 ENCOUNTER — Encounter: Payer: Self-pay | Admitting: Adult Health

## 2012-08-02 ENCOUNTER — Other Ambulatory Visit (HOSPITAL_BASED_OUTPATIENT_CLINIC_OR_DEPARTMENT_OTHER): Payer: PRIVATE HEALTH INSURANCE | Admitting: Lab

## 2012-08-02 ENCOUNTER — Ambulatory Visit (HOSPITAL_BASED_OUTPATIENT_CLINIC_OR_DEPARTMENT_OTHER): Payer: PRIVATE HEALTH INSURANCE | Admitting: Adult Health

## 2012-08-02 ENCOUNTER — Ambulatory Visit: Payer: PRIVATE HEALTH INSURANCE

## 2012-08-02 VITALS — BP 126/83 | HR 84 | Temp 98.1°F | Resp 20 | Ht 60.0 in | Wt 168.8 lb

## 2012-08-02 DIAGNOSIS — R5381 Other malaise: Secondary | ICD-10-CM

## 2012-08-02 DIAGNOSIS — C50419 Malignant neoplasm of upper-outer quadrant of unspecified female breast: Secondary | ICD-10-CM

## 2012-08-02 DIAGNOSIS — C50919 Malignant neoplasm of unspecified site of unspecified female breast: Secondary | ICD-10-CM

## 2012-08-02 DIAGNOSIS — Z23 Encounter for immunization: Secondary | ICD-10-CM

## 2012-08-02 DIAGNOSIS — C773 Secondary and unspecified malignant neoplasm of axilla and upper limb lymph nodes: Secondary | ICD-10-CM

## 2012-08-02 DIAGNOSIS — Z5112 Encounter for antineoplastic immunotherapy: Secondary | ICD-10-CM

## 2012-08-02 LAB — CBC WITH DIFFERENTIAL/PLATELET
Basophils Absolute: 0 10*3/uL (ref 0.0–0.1)
EOS%: 2.6 % (ref 0.0–7.0)
Eosinophils Absolute: 0.2 10*3/uL (ref 0.0–0.5)
HCT: 29.3 % — ABNORMAL LOW (ref 34.8–46.6)
HGB: 9 g/dL — ABNORMAL LOW (ref 11.6–15.9)
MCH: 26.5 pg (ref 25.1–34.0)
NEUT#: 4.3 10*3/uL (ref 1.5–6.5)
NEUT%: 66.6 % (ref 38.4–76.8)
RDW: 23.5 % — ABNORMAL HIGH (ref 11.2–14.5)
lymph#: 1.2 10*3/uL (ref 0.9–3.3)

## 2012-08-02 LAB — COMPREHENSIVE METABOLIC PANEL (CC13)
Alkaline Phosphatase: 74 U/L (ref 40–150)
Creatinine: 0.6 mg/dL (ref 0.6–1.1)
Glucose: 104 mg/dl — ABNORMAL HIGH (ref 70–99)
Sodium: 142 mEq/L (ref 136–145)
Total Bilirubin: 0.56 mg/dL (ref 0.20–1.20)
Total Protein: 6.1 g/dL — ABNORMAL LOW (ref 6.4–8.3)

## 2012-08-02 MED ORDER — DIPHENHYDRAMINE HCL 25 MG PO CAPS
50.0000 mg | ORAL_CAPSULE | Freq: Once | ORAL | Status: AC
  Administered 2012-08-02: 50 mg via ORAL

## 2012-08-02 MED ORDER — HEPARIN SOD (PORK) LOCK FLUSH 100 UNIT/ML IV SOLN
500.0000 [IU] | Freq: Once | INTRAVENOUS | Status: AC | PRN
  Administered 2012-08-02: 500 [IU]
  Filled 2012-08-02: qty 5

## 2012-08-02 MED ORDER — SODIUM CHLORIDE 0.9 % IV SOLN
Freq: Once | INTRAVENOUS | Status: AC
  Administered 2012-08-02: 12:00:00 via INTRAVENOUS

## 2012-08-02 MED ORDER — SODIUM CHLORIDE 0.9 % IJ SOLN
10.0000 mL | INTRAMUSCULAR | Status: DC | PRN
  Administered 2012-08-02: 10 mL
  Filled 2012-08-02: qty 10

## 2012-08-02 MED ORDER — ACETAMINOPHEN 325 MG PO TABS
650.0000 mg | ORAL_TABLET | Freq: Once | ORAL | Status: AC
  Administered 2012-08-02: 650 mg via ORAL

## 2012-08-02 MED ORDER — TRASTUZUMAB CHEMO INJECTION 440 MG
2.0000 mg/kg | Freq: Once | INTRAVENOUS | Status: AC
  Administered 2012-08-02: 147 mg via INTRAVENOUS
  Filled 2012-08-02: qty 7

## 2012-08-02 MED ORDER — INFLUENZA VIRUS VACC SPLIT PF IM SUSP
0.5000 mL | Freq: Once | INTRAMUSCULAR | Status: AC
  Administered 2012-08-02: 0.5 mL via INTRAMUSCULAR
  Filled 2012-08-02: qty 0.5

## 2012-08-02 NOTE — Patient Instructions (Addendum)
[facility] Discharge Instructions for Patients Receiving Chemotherapy  Today you received the following chemotherapy agents :  Herceptin.  To help prevent nausea and vomiting after your treatment, we encourage you to take your nausea medication as instructed by your physician. Inactivated Influenza Vaccine What You Need to Know WHY GET VACCINATED?  Influenza ("flu") is a contagious disease.  It is caused by the influenza virus, which can be spread by coughing, sneezing, or nasal secretions.  Anyone can get influenza, but rates of infection are highest among children. For most people, symptoms last only a few days. They include:  Fever or chills.  Sore throat.  Muscle aches.  Fatigue.  Cough.  Headache.  Runny or stuffy nose. Other illnesses can have the same symptoms and are often mistaken for influenza. Young children, people 55 and older, pregnant women, and people with certain health conditions, such as heart, lung or kidney disease, or a weakened immune system can get much sicker. Flu can cause high fever and pneumonia, and make existing medical conditions worse. It can cause diarrhea and seizures in children. Each year thousands of people die from influenza and even more require hospitalization. By getting flu vaccine, you can protect yourself from influenza and may also avoid spreading influenza to others. INACTIVATED INFLUENZA VACCINE There are 2 types of influenza vaccine:  Inactivated (killed) vaccine, the "flu shot", is given by injection with a needle.  Live, attenuated (weakened) influenza vaccine is sprayed into the nostrils. This vaccine is described in a separate Vaccine Information Statement. A "high-dose" inactivated influenza vaccine is available for people 62 years of age and older. Ask your doctor for more information.  Influenza viruses are always changing, so annual vaccination is recommended. Each year scientists try to match the viruses in  the vaccine to those most likely to cause flu that year. Flu vaccine will not prevent disease from other viruses, including flu viruses not contained in the vaccine. It takes up to 2 weeks for protection to develop after the shot. Protection lasts about 1 year. Some inactivated influenza vaccine contains a preservative called thimerosal. Thimerosal-free influenza vaccine is available. Ask your doctor for more information. WHO SHOULD GET INACTIVATED INFLUENZA VACCINE AND WHEN? WHO  All people 1 months of age and older should get flu vaccine.  Vaccination is especially important for people at higher risk of severe influenza and their close contacts, including healthcare personnel and close contacts of children younger than 6 months. WHEN Getting the vaccine as soon as it is available. This should provide protection if the flu season comes early. You can get the vaccine as long as illness is occurring in your community. Influenza can occur at any time, but most influenza occurs from October through May. In recent seasons, most infections have occurred in January and February. Getting vaccinated in December, or even later, will still be beneficial in most years. Adults and older children need 1 dose of influenza vaccine each year. But some children younger than 40 years of age need 2 doses to be protected. Ask your doctor. Influenza vaccine may be given at the same time as other vaccines, including pneumococcal vaccine. SOME PEOPLE SHOULD NOT GET INACTIVATED INFLUENZA VACCINE OR SHOULD WAIT  Tell your doctor if you have any severe (life-threatening) allergies, including a severe allergy to eggs. A severe allergy to any vaccine component may be a reason not to get the vaccine. Allergic reactions to influenza vaccine are rare.  Tell your doctor if you ever  had a severe reaction after a dose of influenza vaccine.  Tell your doctor if you ever had Guillain-Barr syndrome (a severe paralytic illness, also  called GBS). Your doctor will help you decide whether the vaccine is recommended for you.  People who are moderately or severely ill should usually wait until they recover before getting the flu vaccine. If you are ill, talk to your doctor about whether to reschedule the vaccination. People with a mild illness can usually get the vaccine. WHAT ARE THE RISKS FROM INACTIVATED INFLUENZA VACCINE? A vaccine, like any medicine, could possibly cause serious problems, such as severe allergic reactions. The risk of a vaccine causing serious harm, or death, is extremely small. Serious problems from inactivated influenza vaccine are very rare. The viruses in inactivated influenza vaccine have been killed, so you cannot get influenza from the vaccine. Mild problems:  Soreness, redness, or swelling where the shot was given.  Hoarseness; sore, red or itchy eyes; cough.  Fever.  Aches.  Headache.  Itching.  Fatigue. If these problems occur, they usually begin soon after the shot and last 1 to 2 days. Moderate problems: Young children who get inactivated flu vaccine and pneumococcal vaccine (PCV13) at the same time appear to be at increased risk for seizures caused by fever. Ask your doctor for more information. Tell your doctor if a child who is getting flu vaccine has ever had a seizure. Severe problems:  Life-threatening allergic reactions from vaccines are very rare. If they do occur, it is usually within a few minutes to a few hours after the shot.  In 1976, a type of inactivated influenza (swine flu) vaccine was associated with Guillan-Barr syndrome (GBS). Since then, flu vaccines have not been clearly linked to GBS. However, if there is a risk of GBS from current flu vaccines, it would be no more than 1 or 2 cases per million people vaccinated. This is much lower than the risk of severe influenza, which can be prevented by vaccination. The safety of vaccines is always being monitored. For more  information, visit:  PrintingMaps.se and  https://www.farmer-stevens.info/ One brand of inactivated flu vaccine, called Afluria, should not be given to children 35 years of age or younger, except in special circumstances. A related vaccine was associated with fevers and fever-related seizures in young children in [place]. Your doctor can give you more information. WHAT IF THERE IS A SEVERE REACTION? What should I look for? Any unusual condition, such as a high fever or unusual behavior. Signs of a serious allergic reaction can include difficulty breathing, hoarseness or wheezing, hives, paleness, weakness, a fast heartbeat, or dizziness. What should I do?  Call a doctor, or get the person to a doctor right away.  Tell your doctor what happened, the date and time it happened, and when the vaccination was given.  Ask your doctor, nurse, or health department to report the reaction by filing a Vaccine Adverse Event Reporting System (VAERS) form. Or, you can file this report through the VAERS website at www.vaers.LAgents.no or by calling 1-612-479-1560. VAERS does not provide medical advice. THE NATIONAL VACCINE INJURY COMPENSATION PROGRAM The National Vaccine Injury Compensation Program (VICP) was created in 1986. People who believe they may have been injured by a vaccine can learn about the program and about filing a claim by calling 1-939 462 3603, or visiting the VICP website at SpiritualWord.at HOW CAN I LEARN MORE?  Ask your doctor. They can give you the vaccine package insert or suggest other sources of information.  Call your local or state health department.  Contact the Centers for Disease Control and Prevention (CDC):  Call 867-628-9901 (1-800-CDC-INFO) or  Visit the CDC's website at BiotechRoom.com.cy CDC Inactivated Influenza Vaccine VIS (01/02/11) Document Released: 04/13/2006 Document Revised:  12/19/2011 Document Reviewed: 01/02/2011 [facility] Patient Information 2013 [place], [place].    If you develop nausea and vomiting that is not controlled by your nausea medication, call the clinic. If it is after clinic hours your family physician or the after hours number for the clinic or go to the Emergency Department.   BELOW ARE SYMPTOMS THAT SHOULD BE REPORTED IMMEDIATELY:  *FEVER GREATER THAN 100.5 F  *CHILLS WITH OR WITHOUT FEVER  NAUSEA AND VOMITING THAT IS NOT CONTROLLED WITH YOUR NAUSEA MEDICATION  *UNUSUAL SHORTNESS OF BREATH  *UNUSUAL BRUISING OR BLEEDING  TENDERNESS IN MOUTH AND THROAT WITH OR WITHOUT PRESENCE OF ULCERS  *URINARY PROBLEMS  *BOWEL PROBLEMS  UNUSUAL RASH Items with * indicate a potential emergency and should be followed up as soon as possible.  One of the nurses will contact you 24 hours after your treatment. Please let the nurse know about any problems that you may have experienced. Feel free to call the clinic you have any questions or concerns. The clinic phone number is [phone].   I have been informed and understand all the instructions given to me. I know to contact the clinic, my physician, or go to the Emergency Department if any problems should occur. I do not have any questions at this time, but understand that I may call the clinic during office hours   should I have any questions or need assistance in obtaining follow up care.    __________________________________________  _____________  __________ Signature of Patient or Authorized Representative            Date                   Time    __________________________________________ Nurse's Signature

## 2012-08-02 NOTE — Progress Notes (Signed)
OFFICE PROGRESS NOTE  CC  [name], [name], MD [address] [place] [address] Dr. Emelia Loron Dr. 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 Originally diagnosed in September 2013.  PRIOR THERAPY:  #1Patient originally presented in September 2013 when she noticed dimpling within the left breast. She subsequently had a mammogram that showed suspicious findings. Further workup showed multiple areas of concern. There was noted to be a 2.8 cm spiculated mass within the upper outer quadrant of the left breast. The pathology did show invasive ductal carcinoma from the upper-outer quadrant of the left breast. There was also noted to be a 9 mm enhancing mass which was 2.2 cm anterior to the known area of malignancy. A third mass measuring 8 mm was 2.1 cm anterior to the known area of malignancy. Also a satellite spiculated mass was present 8 mm and a 4 mm rounded mass in the 12:00. She did have additional biopsies and was diagnosed With multicentric disease. There were also noted to be couple of small areas of concern in the right breast but these were negative for malignancy on biopsy.  #2 patient ultimately proceeded to undergo simple mastectomy on the left with sentinel lymph node biopsy on 03/21/2012. The final pathology within the left breast showed multifocal invasive and in situ ductal carcinoma. 4 distinct areas were seen measuring 3.2 cm, 2.3 cm, 1.3 cm, and 0.8 cm. All margins were negative. There was focal lymphatic vascular involvement and normal involvement by tumor. 2 sentinel nodes were positive for metastatic carcinoma with extracapsular extension. The tumors were ER positive PR positive and HER-2/neu positive.  #3 patient did undergo axillary lymph node dissection of the left axilla On 04/04/2012 additional 8 lymph nodes were removed. I have those 3 were positive therefore patient had 5/10 lymph nodes  positive for metastatic disease. With the final pathologic staging at T2 N2a.  #4 patient was begun on adjuvant systemic chemotherapy by Dr. Pierce Crane consisting of Taxotere carboplatinum given every 21 days with Herceptin given on oh weekly basis for the duration of Taxotere and carbo platinum. Her chemotherapy began On 05/03/2012. A total of 6 cycles of TCH combination is planned.  #5 after completion of chemotherapy she will begin radiation therapy and she has RE been seen by Dr. Dorothy Puffer.  CURRENT THERAPY:Patient is here for weekly Herceptin, TCH has been put on hold until 2/14 due to weakness.  INTERVAL HISTORY: Alicia Pena 42 y.o. female returns for Followup visit prior to the Herceptin today.  She is here in a wheelchair today.  She has gone to her physical therapy appointments and is doing well with these.  She missed 2 of her 4 appts this week due to the snow.  She is doing the exercises they gave her at home, and she does feel slightly stronger.  She continues to experience fatigue, but otherwise a 10 point ROS is neg.   MEDICAL HISTORY: Past Medical History  Diagnosis Date  . Anemia   . Anxiety     does not like needles ..   . 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 Breast cancer 03/21/2012    left multifocal invasive, in situ ca,margins not involved,ER/PR=+,metastatic (1/1) node  . Allergy   . Complication of anesthesia     ALLERGIES:  is allergic to duricef.  MEDICATIONS:  Current Outpatient Prescriptions  Medication Sig Dispense Refill  . acetaminophen (TYLENOL) 325  MG tablet Take 650 mg by mouth every 6 (six) hours as needed.      . ALPRAZolam (XANAX) 0.25 MG tablet Take 1 tablet (0.25 mg total) by mouth at bedtime as needed for sleep.  10 tablet  0  . dexamethasone (DECADRON) 4 MG tablet TAKE 2 TABLETS BY MOUTH TWICE DAILY WITH MEALS THE DAY BEFORE CHEMO, THEN FOR 3 DAYS STARTING DAY AFTER CHEMO  30 tablet  0  . ibuprofen (ADVIL,MOTRIN) 200 MG tablet Take 200 mg by mouth  every 6 (six) hours as needed.      . lidocaine-prilocaine (EMLA) cream Apply topically as needed.  30 g  2  . LORazepam (ATIVAN) 0.5 MG tablet Take 1 tablet (0.5 mg total) by mouth every 6 (six) hours as needed (Nausea or vomiting).  30 tablet  0  . LORazepam (ATIVAN) 1 MG tablet       . Multiple Vitamin (MULTIVITAMIN WITH MINERALS) TABS Take 1 tablet by mouth daily.      . ondansetron (ZOFRAN) 8 MG tablet Take 1 tablet (8 mg total) by mouth 2 (two) times daily. Take two times a day starting the day after chemo for 3 days. Then take two times a day as needed for nausea or vomiting.  30 tablet  1  . oxyCODONE-acetaminophen (ROXICET) 5-325 MG per tablet Take 1-2 tablets by mouth every 4 (four) hours as needed for pain.  30 tablet  0  . prochlorperazine (COMPAZINE) 10 MG tablet Take 1 tablet (10 mg total) by mouth every 6 (six) hours as needed (Nausea or vomiting).  30 tablet  1  . prochlorperazine (COMPAZINE) 25 MG suppository Place 1 suppository (25 mg total) rectally every 12 (twelve) hours as needed for nausea.  12 suppository  3    SURGICAL HISTORY:  Past Surgical History  Procedure Date  . Leg surgery     LLE  . Eye surgery     as a child  . Mastectomy complete / simple w/ sentinel node biopsy 03/21/2012    left Dr.Matthew Dwain Sarna  . Breast biopsy 89/07/2011    left breast uoq bx=invasive ductal ca  . Breast biopsy bi lateral 02/15/12    left = invasive ductal, right breast= fibroadenoma,fibrocystic changes,no atypia,hyperlasia or malignancy identified  . Abdominal hysterectomy 2006    w/o oophorectomy  . Axillary lymph node dissection 04/04/2012    Procedure: AXILLARY LYMPH NODE DISSECTION;  Surgeon: Emelia Loron, MD;  Location: [facility];  Service: General;  Laterality: Left;  Left Axillary lymph node dissection, Port a cath placement  . Portacath placement 04/04/2012    Procedure: INSERTION PORT-A-CATH;  Surgeon: Emelia Loron, MD;  Location: [facility];  Service: General;  Laterality: Right;    REVIEW OF SYSTEMS:   General: fatigue (+), night sweats (-), fever (-), pain (-) Lymph: palpable nodes (-) HEENT: vision changes (-), mucositis (-), gum bleeding (-), epistaxis (-) Cardiovascular: chest pain (-), palpitations (-) Pulmonary: shortness of breath (-), dyspnea on exertion (-), cough (-), hemoptysis (-) GI:  Early satiety (-), melena (-), dysphagia (-), nausea/vomiting (-), diarrhea (-) GU: dysuria (-), hematuria (-), incontinence (-) Musculoskeletal: joint swelling (-), joint pain (-), back pain (-) Neuro: weakness (+), numbness (-), headache (-), confusion (-) Skin: Rash (-), lesions (-), dryness (-) Psych: depression (-), suicidal/homicidal ideation (-), feeling of hopelessness (-)    PHYSICAL EXAMINATION: Blood pressure 126/83, pulse 84, temperature 98.1 F (36.7 C), temperature source Oral, resp. rate 20, height 5' (  1.524 m), weight 168 lb 12.8 oz (76.567 kg). Body mass index is 32.97 kg/(m^2). General: Patient is a well appearing female in no acute distress HEENT: PERRLA, sclerae anicteric no conjunctival pallor, MMM Neck: supple, no palpable adenopathy Lungs: clear to auscultation bilaterally, no wheezes, rhonchi, or rales Cardiovascular: regular rate rhythm, S1, S2, no murmurs, rubs or gallops Abdomen: Soft, non-tender, non-distended, normoactive bowel sounds, no HSM Extremities: warm and well perfused, no clubbing, cyanosis, or edema Skin: No rashes or lesions Neuro: Non-focal ECOG PERFORMANCE STATUS: 1 - Symptomatic but completely ambulatory   LABORATORY DATA: Lab Results  Component Value Date   WBC 6.5 08/02/2012   HGB 9.0* 08/02/2012   HCT 29.3* 08/02/2012   MCV 86.2 08/02/2012   PLT 364 08/02/2012      Chemistry      Component Value Date/Time   NA 139 07/26/2012 1136   NA 138 04/02/2012 1100   K 4.2 07/26/2012 1136   K 3.9 04/02/2012 1100   CL 109* 07/26/2012 1136   CL 103 04/02/2012 1100   CO2  20* 07/26/2012 1136   CO2 25 04/02/2012 1100   BUN 20.5 07/26/2012 1136   BUN 16 04/02/2012 1100   CREATININE 0.6 07/26/2012 1136   CREATININE 0.49* 04/02/2012 1100      Component Value Date/Time   CALCIUM 9.3 07/26/2012 1136   CALCIUM 9.0 04/02/2012 1100   ALKPHOS 76 07/26/2012 1136   AST 22 07/26/2012 1136   ALT 33 07/26/2012 1136   BILITOT 0.66 07/26/2012 1136       RADIOGRAPHIC STUDIES:  No results found.  ASSESSMENT: 42 year old female with  #1 T2 N2 A. Invasive ductal carcinoma of the left breast that was multifocal she is status post mastectomy with axillary lymph node dissection in September October 2013. Patient's tumor was ER positive PR positive HER-2/neu positive.  #2 she is currently receiving adjuvant chemotherapy consisting of Taxotere carboplatinum and Herceptin total of 6 cycles of therapy is planned. She is here for cycle #4 Of [facility] today . Overall she is tolerating her treatment quite nicely.  #3 patient will need an echocardiogram-1/15 showed EF of 55-60%.  #4 patient is complaining of increasing weakness in her lower extremities. She does have underlying cerebral palsy. And she feels since starting her chemotherapy she has had more weakness. I will refer her to physical therapy.   PLAN:   #1 Proceed with Herceptin only today.  We will hold the Taxotere/Carboplatinum and give her a few weeks to regain her strength and re-evaluate on February 14.  She will continue to receive weekly Herceptin.  She's doing well with her physical therapy and will continue this.      #2 We will see her back next week for weekly Herceptin.  She would like her flu shot today.  I will order this.    All questions were answered. The patient knows to call the clinic with any problems, questions or concerns. We can certainly see the patient much sooner if necessary.  I spent 15 minutes counseling the patient face to face. The total time spent in the appointment was 30 minutes.  Cherie Ouch  Lyn Hollingshead, NP Medical Oncology [facility] Phone: [phone]    08/02/2012, 9:32 AM

## 2012-08-02 NOTE — Patient Instructions (Addendum)
Doing well.  Continue physical therapy. Proceed with Herceptin today.  We will see you back next week.  Please call us if you have any questions or concerns.

## 2012-08-05 ENCOUNTER — Ambulatory Visit: Payer: PRIVATE HEALTH INSURANCE | Attending: Oncology | Admitting: Physical Therapy

## 2012-08-05 DIAGNOSIS — R279 Unspecified lack of coordination: Secondary | ICD-10-CM | POA: Insufficient documentation

## 2012-08-05 DIAGNOSIS — IMO0001 Reserved for inherently not codable concepts without codable children: Secondary | ICD-10-CM | POA: Insufficient documentation

## 2012-08-05 DIAGNOSIS — R269 Unspecified abnormalities of gait and mobility: Secondary | ICD-10-CM | POA: Insufficient documentation

## 2012-08-05 DIAGNOSIS — M6281 Muscle weakness (generalized): Secondary | ICD-10-CM | POA: Insufficient documentation

## 2012-08-07 ENCOUNTER — Ambulatory Visit: Payer: PRIVATE HEALTH INSURANCE | Admitting: Physical Therapy

## 2012-08-09 ENCOUNTER — Other Ambulatory Visit (HOSPITAL_BASED_OUTPATIENT_CLINIC_OR_DEPARTMENT_OTHER): Payer: PRIVATE HEALTH INSURANCE | Admitting: Lab

## 2012-08-09 ENCOUNTER — Ambulatory Visit (HOSPITAL_BASED_OUTPATIENT_CLINIC_OR_DEPARTMENT_OTHER): Payer: PRIVATE HEALTH INSURANCE

## 2012-08-09 ENCOUNTER — Encounter: Payer: Self-pay | Admitting: Adult Health

## 2012-08-09 ENCOUNTER — Ambulatory Visit: Payer: PRIVATE HEALTH INSURANCE

## 2012-08-09 ENCOUNTER — Ambulatory Visit (HOSPITAL_BASED_OUTPATIENT_CLINIC_OR_DEPARTMENT_OTHER): Payer: PRIVATE HEALTH INSURANCE | Admitting: Adult Health

## 2012-08-09 VITALS — BP 137/88 | HR 89 | Temp 98.1°F | Resp 20 | Ht 60.0 in | Wt 168.4 lb

## 2012-08-09 DIAGNOSIS — C50419 Malignant neoplasm of upper-outer quadrant of unspecified female breast: Secondary | ICD-10-CM

## 2012-08-09 DIAGNOSIS — C50919 Malignant neoplasm of unspecified site of unspecified female breast: Secondary | ICD-10-CM

## 2012-08-09 DIAGNOSIS — Z170 Estrogen receptor positive status [ER+]: Secondary | ICD-10-CM

## 2012-08-09 DIAGNOSIS — Z5112 Encounter for antineoplastic immunotherapy: Secondary | ICD-10-CM

## 2012-08-09 LAB — COMPREHENSIVE METABOLIC PANEL (CC13)
ALT: 26 U/L (ref 0–55)
Albumin: 3.2 g/dL — ABNORMAL LOW (ref 3.5–5.0)
CO2: 24 mEq/L (ref 22–29)
Calcium: 8.6 mg/dL (ref 8.4–10.4)
Chloride: 110 mEq/L — ABNORMAL HIGH (ref 98–107)
Potassium: 3.4 mEq/L — ABNORMAL LOW (ref 3.5–5.1)
Sodium: 144 mEq/L (ref 136–145)
Total Bilirubin: 0.76 mg/dL (ref 0.20–1.20)
Total Protein: 6.6 g/dL (ref 6.4–8.3)

## 2012-08-09 LAB — CBC WITH DIFFERENTIAL/PLATELET
Basophils Absolute: 0 10*3/uL (ref 0.0–0.1)
Eosinophils Absolute: 0.3 10*3/uL (ref 0.0–0.5)
HCT: 30.1 % — ABNORMAL LOW (ref 34.8–46.6)
HGB: 9.9 g/dL — ABNORMAL LOW (ref 11.6–15.9)
LYMPH%: 25 % (ref 14.0–49.7)
MCV: 83.1 fL (ref 79.5–101.0)
MONO#: 0.5 10*3/uL (ref 0.1–0.9)
MONO%: 6.8 % (ref 0.0–14.0)
NEUT#: 4.7 10*3/uL (ref 1.5–6.5)
NEUT%: 63.6 % (ref 38.4–76.8)
Platelets: 243 10*3/uL (ref 145–400)
RBC: 3.62 10*6/uL — ABNORMAL LOW (ref 3.70–5.45)
WBC: 7.4 10*3/uL (ref 3.9–10.3)

## 2012-08-09 MED ORDER — HEPARIN SOD (PORK) LOCK FLUSH 100 UNIT/ML IV SOLN
500.0000 [IU] | Freq: Once | INTRAVENOUS | Status: AC | PRN
  Administered 2012-08-09: 500 [IU]
  Filled 2012-08-09: qty 5

## 2012-08-09 MED ORDER — ACETAMINOPHEN 325 MG PO TABS
650.0000 mg | ORAL_TABLET | Freq: Once | ORAL | Status: AC
  Administered 2012-08-09: 650 mg via ORAL

## 2012-08-09 MED ORDER — DIPHENHYDRAMINE HCL 25 MG PO CAPS
50.0000 mg | ORAL_CAPSULE | Freq: Once | ORAL | Status: AC
  Administered 2012-08-09: 50 mg via ORAL

## 2012-08-09 MED ORDER — SODIUM CHLORIDE 0.9 % IV SOLN
Freq: Once | INTRAVENOUS | Status: AC
  Administered 2012-08-09: 14:00:00 via INTRAVENOUS

## 2012-08-09 MED ORDER — TRASTUZUMAB CHEMO INJECTION 440 MG
2.0000 mg/kg | Freq: Once | INTRAVENOUS | Status: AC
  Administered 2012-08-09: 147 mg via INTRAVENOUS
  Filled 2012-08-09: qty 7

## 2012-08-09 MED ORDER — SODIUM CHLORIDE 0.9 % IJ SOLN
10.0000 mL | INTRAMUSCULAR | Status: DC | PRN
  Administered 2012-08-09: 10 mL
  Filled 2012-08-09: qty 10

## 2012-08-09 NOTE — Progress Notes (Signed)
OFFICE PROGRESS NOTE  CC  Alicia Pena, [name], MD [address] [place] [address] Dr. Emelia Loron Dr. 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 Originally diagnosed in September 2013.  PRIOR THERAPY:  #1Patient originally presented in September 2013 when she noticed dimpling within the left breast. She subsequently had a mammogram that showed suspicious findings. Further workup showed multiple areas of concern. There was noted to be a 2.8 cm spiculated mass within the upper outer quadrant of the left breast. The pathology did show invasive ductal carcinoma from the upper-outer quadrant of the left breast. There was also noted to be a 9 mm enhancing mass which was 2.2 cm anterior to the known area of malignancy. A third mass measuring 8 mm was 2.1 cm anterior to the known area of malignancy. Also a satellite spiculated mass was present 8 mm and a 4 mm rounded mass in the 12:00. She did have additional biopsies and was diagnosed With multicentric disease. There were also noted to be couple of small areas of concern in the right breast but these were negative for malignancy on biopsy.  #2 patient ultimately proceeded to undergo simple mastectomy on the left with sentinel lymph node biopsy on 03/21/2012. The final pathology within the left breast showed multifocal invasive and in situ ductal carcinoma. 4 distinct areas were seen measuring 3.2 cm, 2.3 cm, 1.3 cm, and 0.8 cm. All margins were negative. There was focal lymphatic vascular involvement and normal involvement by tumor. 2 sentinel nodes were positive for metastatic carcinoma with extracapsular extension. The tumors were ER positive PR positive and HER-2/neu positive.  #3 patient did undergo axillary lymph node dissection of the left axilla On 04/04/2012 additional 8 lymph nodes were removed. I have those 3 were positive therefore patient had 5/10 lymph nodes  positive for metastatic disease. With the final pathologic staging at T2 N2a.  #4 patient was begun on adjuvant systemic chemotherapy by Dr. Pierce Crane consisting of Taxotere carboplatinum given every 21 days with Herceptin given on oh weekly basis for the duration of Taxotere and carbo platinum. Her chemotherapy began On 05/03/2012. A total of 6 cycles of TCH combination is planned.  #5 after completion of chemotherapy she will begin radiation therapy and she has RE been seen by Dr. Dorothy Puffer.  CURRENT THERAPY:Patient is here for weekly Herceptin, TCH has been put on hold until 2/14 due to weakness.  INTERVAL HISTORY: Dmya Pena 42 y.o. female returns for Followup visit prior to the Herceptin today.  She is doing well with physical therapy.  She is going twice a week and doing her take home exercises daily.  She walked into clinic today.  She denies fevers, chills, chest pain, shortness of breath, increased lower extremity swelling or any other concerns.  A 10 point ROS is otherwise neg.   MEDICAL HISTORY: Past Medical History  Diagnosis Date  . Anemia   . Anxiety     does not like needles ..   . 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 Breast cancer 03/21/2012    left multifocal invasive, in situ ca,margins not involved,ER/PR=+,metastatic (1/1) node  . Allergy   . Complication of anesthesia     ALLERGIES:  is allergic to duricef.  MEDICATIONS:  Current Outpatient Prescriptions  Medication Sig Dispense Refill  . acetaminophen (TYLENOL) 325 MG tablet Take 650 mg by mouth every 6 (six) hours as needed.      Marland Kitchen  ALPRAZolam (XANAX) 0.25 MG tablet Take 1 tablet (0.25 mg total) by mouth at bedtime as needed for sleep.  10 tablet  0  . dexamethasone (DECADRON) 4 MG tablet TAKE 2 TABLETS BY MOUTH TWICE DAILY WITH MEALS THE DAY BEFORE CHEMO, THEN FOR 3 DAYS STARTING DAY AFTER CHEMO  30 tablet  0  . ibuprofen (ADVIL,MOTRIN) 200 MG tablet Take 200 mg by mouth every 6 (six) hours as needed.      .  lidocaine-prilocaine (EMLA) cream Apply topically as needed.  30 g  2  . LORazepam (ATIVAN) 0.5 MG tablet Take 1 tablet (0.5 mg total) by mouth every 6 (six) hours as needed (Nausea or vomiting).  30 tablet  0  . LORazepam (ATIVAN) 1 MG tablet       . Multiple Vitamin (MULTIVITAMIN WITH MINERALS) TABS Take 1 tablet by mouth daily.      . ondansetron (ZOFRAN) 8 MG tablet Take 1 tablet (8 mg total) by mouth 2 (two) times daily. Take two times a day starting the day after chemo for 3 days. Then take two times a day as needed for nausea or vomiting.  30 tablet  1  . oxyCODONE-acetaminophen (ROXICET) 5-325 MG per tablet Take 1-2 tablets by mouth every 4 (four) hours as needed for pain.  30 tablet  0  . prochlorperazine (COMPAZINE) 10 MG tablet Take 1 tablet (10 mg total) by mouth every 6 (six) hours as needed (Nausea or vomiting).  30 tablet  1  . prochlorperazine (COMPAZINE) 25 MG suppository Place 1 suppository (25 mg total) rectally every 12 (twelve) hours as needed for nausea.  12 suppository  3   No current facility-administered medications for this visit.   Facility-Administered Medications Ordered in Other Visits  Medication Dose Route Frequency Provider Last Rate Last Dose  . heparin lock flush 100 unit/mL  500 Units Intracatheter Once PRN Victorino December, MD      . sodium chloride 0.9 % injection 10 mL  10 mL Intracatheter PRN Victorino December, MD      . trastuzumab (HERCEPTIN) 147 mg in sodium chloride 0.9 % 250 mL chemo infusion  2 mg/kg (Treatment Plan Actual) Intravenous Once Victorino December, MD        SURGICAL HISTORY:  Past Surgical History  Procedure Date  . Leg surgery     LLE  . Eye surgery     as a child  . Mastectomy complete / simple w/ sentinel node biopsy 03/21/2012    left Dr.Matthew Dwain Sarna  . Breast biopsy 89/07/2011    left breast uoq bx=invasive ductal ca  . Breast biopsy bi lateral 02/15/12    left = invasive ductal, right breast= fibroadenoma,fibrocystic changes,no  atypia,hyperlasia or malignancy identified  . Abdominal hysterectomy 2006    w/o oophorectomy  . Axillary lymph node dissection 04/04/2012    Procedure: AXILLARY LYMPH NODE DISSECTION;  Surgeon: Emelia Loron, MD;  Location: [facility];  Service: General;  Laterality: Left;  Left Axillary lymph node dissection, Port a cath placement  . Portacath placement 04/04/2012    Procedure: INSERTION PORT-A-CATH;  Surgeon: Emelia Loron, MD;  Location: [facility];  Service: General;  Laterality: Right;    REVIEW OF SYSTEMS:   General: fatigue (+), night sweats (-), fever (-), pain (-) Lymph: palpable nodes (-) HEENT: vision changes (-), mucositis (-), gum bleeding (-), epistaxis (-) Cardiovascular: chest pain (-), palpitations (-) Pulmonary: shortness of breath (-), dyspnea on exertion (-), cough (-),  hemoptysis (-) GI:  Early satiety (-), melena (-), dysphagia (-), nausea/vomiting (-), diarrhea (-) GU: dysuria (-), hematuria (-), incontinence (-) Musculoskeletal: joint swelling (-), joint pain (-), back pain (-) Neuro: weakness (+), numbness (-), headache (-), confusion (-) Skin: Rash (-), lesions (-), dryness (-) Psych: depression (-), suicidal/homicidal ideation (-), feeling of hopelessness (-)    PHYSICAL EXAMINATION: Blood pressure 137/88, pulse 89, temperature 98.1 F (36.7 C), temperature source Oral, resp. rate 20, height 5' (1.524 m), weight 168 lb 6.4 oz (76.386 kg). Body mass index is 32.89 kg/(m^2). General: Patient is a well appearing female in no acute distress HEENT: PERRLA, sclerae anicteric no conjunctival pallor, MMM Neck: supple, no palpable adenopathy Lungs: clear to auscultation bilaterally, no wheezes, rhonchi, or rales Cardiovascular: regular rate rhythm, S1, S2, no murmurs, rubs or gallops Abdomen: Soft, non-tender, non-distended, normoactive bowel sounds, no HSM Extremities: warm and well perfused, no clubbing, cyanosis, or  edema Skin: No rashes or lesions Neuro: Non-focal ECOG PERFORMANCE STATUS: 1 - Symptomatic but completely ambulatory   LABORATORY DATA: Lab Results  Component Value Date   WBC 7.4 08/09/2012   HGB 9.9* 08/09/2012   HCT 30.1* 08/09/2012   MCV 83.1 08/09/2012   PLT 243 08/09/2012      Chemistry      Component Value Date/Time   NA 142 08/02/2012 0903   NA 138 04/02/2012 1100   K 3.4* 08/02/2012 0903   K 3.9 04/02/2012 1100   CL 109* 08/02/2012 0903   CL 103 04/02/2012 1100   CO2 23 08/02/2012 0903   CO2 25 04/02/2012 1100   BUN 21.1 08/02/2012 0903   BUN 16 04/02/2012 1100   CREATININE 0.6 08/02/2012 0903   CREATININE 0.49* 04/02/2012 1100      Component Value Date/Time   CALCIUM 8.4 08/02/2012 0903   CALCIUM 9.0 04/02/2012 1100   ALKPHOS 74 08/02/2012 0903   AST 16 08/02/2012 0903   ALT 24 08/02/2012 0903   BILITOT 0.56 08/02/2012 0903       RADIOGRAPHIC STUDIES:  No results found.  ASSESSMENT: 42 year old female with  #1 T2 N2 A. Invasive ductal carcinoma of the left breast that was multifocal she is status post mastectomy with axillary lymph node dissection in September October 2013. Patient's tumor was ER positive PR positive HER-2/neu positive.  #2 she is currently receiving adjuvant chemotherapy consisting of Taxotere carboplatinum and Herceptin total of 6 cycles of therapy is planned. She is here for cycle #4 Of [facility] today . Overall she is tolerating her treatment quite nicely.  #3 patient will need an echocardiogram-1/15 showed EF of 55-60%.  #4 patient is complaining of increasing weakness in her lower extremities. She does have underlying cerebral palsy. And she feels since starting her chemotherapy she has had more weakness. I will refer her to physical therapy.   PLAN:   #1 Proceed with Herceptin only today.  We will hold the Taxotere/Carboplatinum and give her a few weeks to regain her strength and re-evaluate on February 14.  She will continue to receive weekly Herceptin.   She's doing well with her physical therapy and will continue this.      #2 We will see her back next week for her full treatment.  If she continues to do well she will be ready to proceed with this.    All questions were answered. The patient knows to call the clinic with any problems, questions or concerns. We can certainly see the patient much sooner if necessary.  I spent 15 minutes counseling the patient face to face. The total time spent in the appointment was 30 minutes.  Cherie Ouch Lyn Hollingshead, NP Medical Oncology [facility] Phone: [phone]    08/09/2012, 2:53 PM

## 2012-08-09 NOTE — Patient Instructions (Addendum)
Doing well.  Proceed with Herceptin.  We will see you next week for your full treatment.  Continue to do well with physical therapy and exercising!  Please call us if you have any questions or concerns.

## 2012-08-09 NOTE — Patient Instructions (Addendum)
[facility] Discharge Instructions for Patients Receiving Chemotherapy  Today you received the following chemotherapy agents Herceptin  To help prevent nausea and vomiting after your treatment, we encourage you to take your nausea medication as prescribed.   If you develop nausea and vomiting that is not controlled by your nausea medication, call the clinic. If it is after clinic hours your family physician or the after hours number for the clinic or go to the Emergency Department.   BELOW ARE SYMPTOMS THAT SHOULD BE REPORTED IMMEDIATELY:  *FEVER GREATER THAN 100.5 F  *CHILLS WITH OR WITHOUT FEVER  NAUSEA AND VOMITING THAT IS NOT CONTROLLED WITH YOUR NAUSEA MEDICATION  *UNUSUAL SHORTNESS OF BREATH  *UNUSUAL BRUISING OR BLEEDING  TENDERNESS IN MOUTH AND THROAT WITH OR WITHOUT PRESENCE OF ULCERS  *URINARY PROBLEMS  *BOWEL PROBLEMS  UNUSUAL RASH Items with * indicate a potential emergency and should be followed up as soon as possible.  One of the nurses will contact you 24 hours after your treatment. Please let the nurse know about any problems that you may have experienced. Feel free to call the clinic you have any questions or concerns. The clinic phone number is [phone].     

## 2012-08-12 ENCOUNTER — Ambulatory Visit: Payer: PRIVATE HEALTH INSURANCE | Admitting: Physical Therapy

## 2012-08-14 ENCOUNTER — Ambulatory Visit: Payer: PRIVATE HEALTH INSURANCE | Admitting: Physical Therapy

## 2012-08-16 ENCOUNTER — Other Ambulatory Visit (HOSPITAL_BASED_OUTPATIENT_CLINIC_OR_DEPARTMENT_OTHER): Payer: PRIVATE HEALTH INSURANCE | Admitting: Lab

## 2012-08-16 ENCOUNTER — Ambulatory Visit: Payer: PRIVATE HEALTH INSURANCE

## 2012-08-16 ENCOUNTER — Ambulatory Visit (HOSPITAL_BASED_OUTPATIENT_CLINIC_OR_DEPARTMENT_OTHER): Payer: PRIVATE HEALTH INSURANCE

## 2012-08-16 ENCOUNTER — Encounter: Payer: Self-pay | Admitting: Adult Health

## 2012-08-16 ENCOUNTER — Ambulatory Visit (HOSPITAL_BASED_OUTPATIENT_CLINIC_OR_DEPARTMENT_OTHER): Payer: PRIVATE HEALTH INSURANCE | Admitting: Adult Health

## 2012-08-16 VITALS — BP 141/87 | HR 77 | Temp 97.8°F | Resp 20 | Ht 60.0 in | Wt 167.6 lb

## 2012-08-16 DIAGNOSIS — C50419 Malignant neoplasm of upper-outer quadrant of unspecified female breast: Secondary | ICD-10-CM

## 2012-08-16 DIAGNOSIS — C773 Secondary and unspecified malignant neoplasm of axilla and upper limb lymph nodes: Secondary | ICD-10-CM

## 2012-08-16 DIAGNOSIS — Z5112 Encounter for antineoplastic immunotherapy: Secondary | ICD-10-CM

## 2012-08-16 DIAGNOSIS — C50919 Malignant neoplasm of unspecified site of unspecified female breast: Secondary | ICD-10-CM

## 2012-08-16 DIAGNOSIS — R5383 Other fatigue: Secondary | ICD-10-CM

## 2012-08-16 LAB — COMPREHENSIVE METABOLIC PANEL (CC13)
Albumin: 3.2 g/dL — ABNORMAL LOW (ref 3.5–5.0)
BUN: 17.8 mg/dL (ref 7.0–26.0)
Calcium: 9 mg/dL (ref 8.4–10.4)
Chloride: 110 mEq/L — ABNORMAL HIGH (ref 98–107)
Glucose: 105 mg/dl — ABNORMAL HIGH (ref 70–99)
Potassium: 3.9 mEq/L (ref 3.5–5.1)
Total Protein: 6.9 g/dL (ref 6.4–8.3)

## 2012-08-16 LAB — CBC WITH DIFFERENTIAL/PLATELET
BASO%: 0.1 % (ref 0.0–2.0)
LYMPH%: 7.3 % — ABNORMAL LOW (ref 14.0–49.7)
MCHC: 31.1 g/dL — ABNORMAL LOW (ref 31.5–36.0)
MONO#: 0.9 10*3/uL (ref 0.1–0.9)
Platelets: 248 10*3/uL (ref 145–400)
RBC: 3.57 10*6/uL — ABNORMAL LOW (ref 3.70–5.45)
RDW: 19.8 % — ABNORMAL HIGH (ref 11.2–14.5)
WBC: 15.8 10*3/uL — ABNORMAL HIGH (ref 3.9–10.3)
nRBC: 0 % (ref 0–0)

## 2012-08-16 MED ORDER — SODIUM CHLORIDE 0.9 % IV SOLN
Freq: Once | INTRAVENOUS | Status: AC
  Administered 2012-08-16: 15:00:00 via INTRAVENOUS

## 2012-08-16 MED ORDER — SODIUM CHLORIDE 0.9 % IJ SOLN
10.0000 mL | INTRAMUSCULAR | Status: DC | PRN
  Administered 2012-08-16: 10 mL
  Filled 2012-08-16: qty 10

## 2012-08-16 MED ORDER — DEXAMETHASONE SODIUM PHOSPHATE 4 MG/ML IJ SOLN
20.0000 mg | Freq: Once | INTRAMUSCULAR | Status: AC
  Administered 2012-08-16: 20 mg via INTRAVENOUS

## 2012-08-16 MED ORDER — DEXTROSE 5 % IV SOLN
75.0000 mg/m2 | Freq: Once | INTRAVENOUS | Status: AC
  Administered 2012-08-16: 130 mg via INTRAVENOUS
  Filled 2012-08-16: qty 13

## 2012-08-16 MED ORDER — HEPARIN SOD (PORK) LOCK FLUSH 100 UNIT/ML IV SOLN
500.0000 [IU] | Freq: Once | INTRAVENOUS | Status: AC | PRN
  Administered 2012-08-16: 500 [IU]
  Filled 2012-08-16: qty 5

## 2012-08-16 MED ORDER — SODIUM CHLORIDE 0.9 % IV SOLN
600.0000 mg | Freq: Once | INTRAVENOUS | Status: AC
  Administered 2012-08-16: 600 mg via INTRAVENOUS
  Filled 2012-08-16: qty 60

## 2012-08-16 MED ORDER — TRASTUZUMAB CHEMO INJECTION 440 MG
2.0000 mg/kg | Freq: Once | INTRAVENOUS | Status: AC
  Administered 2012-08-16: 147 mg via INTRAVENOUS
  Filled 2012-08-16: qty 7

## 2012-08-16 MED ORDER — ONDANSETRON 16 MG/50ML IVPB (CHCC)
16.0000 mg | Freq: Once | INTRAVENOUS | Status: AC
  Administered 2012-08-16: 16 mg via INTRAVENOUS

## 2012-08-16 MED ORDER — ACETAMINOPHEN 325 MG PO TABS
650.0000 mg | ORAL_TABLET | Freq: Once | ORAL | Status: AC
  Administered 2012-08-16: 650 mg via ORAL

## 2012-08-16 MED ORDER — DIPHENHYDRAMINE HCL 25 MG PO CAPS
50.0000 mg | ORAL_CAPSULE | Freq: Once | ORAL | Status: AC
  Administered 2012-08-16: 50 mg via ORAL

## 2012-08-16 NOTE — Patient Instructions (Addendum)
[facility] Discharge Instructions for Patients Receiving Chemotherapy  Today you received the following chemotherapy agents Taxotere, Herceptin and Carboplatin.  To help prevent nausea and vomiting after your treatment, we encourage you to take your nausea medication as directed.    If you develop nausea and vomiting that is not controlled by your nausea medication, call the clinic. If it is after clinic hours your family physician or the after hours number for the clinic or go to the Emergency Department.   BELOW ARE SYMPTOMS THAT SHOULD BE REPORTED IMMEDIATELY:  *FEVER GREATER THAN 100.5 F  *CHILLS WITH OR WITHOUT FEVER  NAUSEA AND VOMITING THAT IS NOT CONTROLLED WITH YOUR NAUSEA MEDICATION  *UNUSUAL SHORTNESS OF BREATH  *UNUSUAL BRUISING OR BLEEDING  TENDERNESS IN MOUTH AND THROAT WITH OR WITHOUT PRESENCE OF ULCERS  *URINARY PROBLEMS  *BOWEL PROBLEMS  UNUSUAL RASH Items with * indicate a potential emergency and should be followed up as soon as possible.   Please let the nurse know about any problems that you may have experienced. Feel free to call the clinic you have any questions or concerns. The clinic phone number is [phone].   I have been informed and understand all the instructions given to me. I know to contact the clinic, my physician, or go to the Emergency Department if any problems should occur. I do not have any questions at this time, but understand that I may call the clinic during office hours   should I have any questions or need assistance in obtaining follow up care.    __________________________________________  _____________  __________ Signature of Patient or Authorized Representative            Date                   Time    __________________________________________ Nurse's Signature

## 2012-08-16 NOTE — Patient Instructions (Addendum)
Doing well.  Proceed with chemotherapy.  Please call us if you have any questions or concerns.     We will see you back next week for your next dose of Herceptin.

## 2012-08-16 NOTE — Progress Notes (Signed)
OFFICE PROGRESS NOTE  CC  [name], [name], MD [address] [place] [address] Dr. Emelia Loron Dr. 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 Originally diagnosed in September 2013.  PRIOR THERAPY:  #1Patient originally presented in September 2013 when she noticed dimpling within the left breast. She subsequently had a mammogram that showed suspicious findings. Further workup showed multiple areas of concern. There was noted to be a 2.8 cm spiculated mass within the upper outer quadrant of the left breast. The pathology did show invasive ductal carcinoma from the upper-outer quadrant of the left breast. There was also noted to be a 9 mm enhancing mass which was 2.2 cm anterior to the known area of malignancy. A third mass measuring 8 mm was 2.1 cm anterior to the known area of malignancy. Also a satellite spiculated mass was present 8 mm and a 4 mm rounded mass in the 12:00. She did have additional biopsies and was diagnosed With multicentric disease. There were also noted to be couple of small areas of concern in the right breast but these were negative for malignancy on biopsy.  #2 patient ultimately proceeded to undergo simple mastectomy on the left with sentinel lymph node biopsy on 03/21/2012. The final pathology within the left breast showed multifocal invasive and in situ ductal carcinoma. 4 distinct areas were seen measuring 3.2 cm, 2.3 cm, 1.3 cm, and 0.8 cm. All margins were negative. There was focal lymphatic vascular involvement and normal involvement by tumor. 2 sentinel nodes were positive for metastatic carcinoma with extracapsular extension. The tumors were ER positive PR positive and HER-2/neu positive.  #3 patient did undergo axillary lymph node dissection of the left axilla On 04/04/2012 additional 8 lymph nodes were removed. I have those 3 were positive therefore patient had 5/10 lymph nodes  positive for metastatic disease. With the final pathologic staging at T2 N2a.  #4 patient was begun on adjuvant systemic chemotherapy by Dr. Pierce Crane consisting of Taxotere carboplatinum given every 21 days with Herceptin given on oh weekly basis for the duration of Taxotere and carbo platinum. Her chemotherapy began On 05/03/2012. A total of 6 cycles of TCH combination is planned.  #5 after completion of chemotherapy she will begin radiation therapy and she has RE been seen by Dr. Dorothy Puffer.  CURRENT THERAPY: TCH C5 D1  INTERVAL HISTORY: Alicia Pena 42 y.o. female returns for Followup visit prior to [facility].  She is regaining her strength daily.  She continues to go to physical therapy and do her daily exercises to rebuild her strength.  She is walking.  She denies numbness, fevers, chills, pain, or any other concerns.  A 10 point ROS is neg.   MEDICAL HISTORY: Past Medical History  Diagnosis Date  . Anemia   . Anxiety     does not like needles ..   . 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 Breast cancer 03/21/2012    left multifocal invasive, in situ ca,margins not involved,ER/PR=+,metastatic (1/1) node  . Allergy   . Complication of anesthesia     ALLERGIES:  is allergic to duricef.  MEDICATIONS:  Current Outpatient Prescriptions  Medication Sig Dispense Refill  . acetaminophen (TYLENOL) 325 MG tablet Take 650 mg by mouth every 6 (six) hours as needed.      . ALPRAZolam (XANAX) 0.25 MG tablet Take 1 tablet (0.25 mg total) by mouth at bedtime as needed for  sleep.  10 tablet  0  . dexamethasone (DECADRON) 4 MG tablet TAKE 2 TABLETS BY MOUTH TWICE DAILY WITH MEALS THE DAY BEFORE CHEMO, THEN FOR 3 DAYS STARTING DAY AFTER CHEMO  30 tablet  0  . lidocaine-prilocaine (EMLA) cream Apply topically as needed.  30 g  2  . LORazepam (ATIVAN) 0.5 MG tablet Take 1 tablet (0.5 mg total) by mouth every 6 (six) hours as needed (Nausea or vomiting).  30 tablet  0  . LORazepam (ATIVAN) 1 MG tablet        . Multiple Vitamin (MULTIVITAMIN WITH MINERALS) TABS Take 1 tablet by mouth daily.      . ondansetron (ZOFRAN) 8 MG tablet Take 1 tablet (8 mg total) by mouth 2 (two) times daily. Take two times a day starting the day after chemo for 3 days. Then take two times a day as needed for nausea or vomiting.  30 tablet  1  . oxyCODONE-acetaminophen (ROXICET) 5-325 MG per tablet Take 1-2 tablets by mouth every 4 (four) hours as needed for pain.  30 tablet  0  . prochlorperazine (COMPAZINE) 10 MG tablet Take 1 tablet (10 mg total) by mouth every 6 (six) hours as needed (Nausea or vomiting).  30 tablet  1  . prochlorperazine (COMPAZINE) 25 MG suppository Place 1 suppository (25 mg total) rectally every 12 (twelve) hours as needed for nausea.  12 suppository  3  . ibuprofen (ADVIL,MOTRIN) 200 MG tablet Take 200 mg by mouth every 6 (six) hours as needed.       No current facility-administered medications for this visit.    SURGICAL HISTORY:  Past Surgical History  Procedure Laterality Date  . Leg surgery      LLE  . Eye surgery      as a child  . Mastectomy complete / simple w/ sentinel node biopsy  03/21/2012    left Dr.Matthew Dwain Sarna  . Breast biopsy  89/07/2011    left breast uoq bx=invasive ductal ca  . Breast biopsy bi lateral  02/15/12    left = invasive ductal, right breast= fibroadenoma,fibrocystic changes,no atypia,hyperlasia or malignancy identified  . Abdominal hysterectomy  2006    w/o oophorectomy  . Axillary lymph node dissection  04/04/2012    Procedure: AXILLARY LYMPH NODE DISSECTION;  Surgeon: Emelia Loron, MD;  Location: [facility];  Service: General;  Laterality: Left;  Left Axillary lymph node dissection, Port a cath placement  . Portacath placement  04/04/2012    Procedure: INSERTION PORT-A-CATH;  Surgeon: Emelia Loron, MD;  Location:  SURGERY CENTER;  Service: General;  Laterality: Right;    REVIEW OF SYSTEMS:   General: fatigue (+), night  sweats (-), fever (-), pain (-) Lymph: palpable nodes (-) HEENT: vision changes (-), mucositis (-), gum bleeding (-), epistaxis (-) Cardiovascular: chest pain (-), palpitations (-) Pulmonary: shortness of breath (-), dyspnea on exertion (-), cough (-), hemoptysis (-) GI:  Early satiety (-), melena (-), dysphagia (-), nausea/vomiting (-), diarrhea (-) GU: dysuria (-), hematuria (-), incontinence (-) Musculoskeletal: joint swelling (-), joint pain (-), back pain (-) Neuro: weakness (+), numbness (-), headache (-), confusion (-) Skin: Rash (-), lesions (-), dryness (-) Psych: depression (-), suicidal/homicidal ideation (-), feeling of hopelessness (-)    PHYSICAL EXAMINATION: Blood pressure 141/87, pulse 77, temperature 97.8 F (36.6 C), temperature source Oral, resp. rate 20, height 5' (1.524 m), weight 167 lb 9 oz (76.006 kg). Body mass index is 32.72 kg/(m^2). General: Patient is a  well appearing female in no acute distress HEENT: PERRLA, sclerae anicteric no conjunctival pallor, MMM Neck: supple, no palpable adenopathy Lungs: clear to auscultation bilaterally, no wheezes, rhonchi, or rales Cardiovascular: regular rate rhythm, S1, S2, no murmurs, rubs or gallops Abdomen: Soft, non-tender, non-distended, normoactive bowel sounds, no HSM Extremities: warm and well perfused, no clubbing, cyanosis, or edema Skin: No rashes or lesions Neuro: Non-focal ECOG PERFORMANCE STATUS: 1 - Symptomatic but completely ambulatory   LABORATORY DATA: Lab Results  Component Value Date   WBC 15.8* 08/16/2012   HGB 9.3* 08/16/2012   HCT 29.9* 08/16/2012   MCV 83.8 08/16/2012   PLT 248 08/16/2012      Chemistry      Component Value Date/Time   NA 144 08/09/2012 1247   NA 138 04/02/2012 1100   K 3.4* 08/09/2012 1247   K 3.9 04/02/2012 1100   CL 110* 08/09/2012 1247   CL 103 04/02/2012 1100   CO2 24 08/09/2012 1247   CO2 25 04/02/2012 1100   BUN 18.8 08/09/2012 1247   BUN 16 04/02/2012 1100   CREATININE 0.7  08/09/2012 1247   CREATININE 0.49* 04/02/2012 1100      Component Value Date/Time   CALCIUM 8.6 08/09/2012 1247   CALCIUM 9.0 04/02/2012 1100   ALKPHOS 78 08/09/2012 1247   AST 16 08/09/2012 1247   ALT 26 08/09/2012 1247   BILITOT 0.76 08/09/2012 1247       RADIOGRAPHIC STUDIES:  No results found.  ASSESSMENT: 42 year old female with  #1 T2 N2 A. Invasive ductal carcinoma of the left breast that was multifocal she is status post mastectomy with axillary lymph node dissection in September October 2013. Patient's tumor was ER positive PR positive HER-2/neu positive.  #2 she is currently receiving adjuvant chemotherapy consisting of Taxotere carboplatinum and Herceptin total of 6 cycles of therapy is planned. She is here for cycle #4 Of [facility] today . Overall she is tolerating her treatment quite nicely.  #3 patient will need an echocardiogram-1/15 showed EF of 55-60%.  #4 patient is complaining of increasing weakness in her lower extremities. She does have underlying cerebral palsy. And she feels since starting her chemotherapy she has had more weakness. I will refer her to physical therapy.   PLAN:   #1 Proceed with TCH.  She will continue her PT and exercises.    #2 We will see her back next week for weekly Herceptin.   All questions were answered. The patient knows to call the clinic with any problems, questions or concerns. We can certainly see the patient much sooner if necessary.  I spent 15 minutes counseling the patient face to face. The total time spent in the appointment was 30 minutes.  Cherie Ouch Lyn Hollingshead, NP Medical Oncology [facility] Phone: [phone]    08/16/2012, 2:08 PM

## 2012-08-17 ENCOUNTER — Ambulatory Visit (HOSPITAL_BASED_OUTPATIENT_CLINIC_OR_DEPARTMENT_OTHER): Payer: PRIVATE HEALTH INSURANCE

## 2012-08-17 ENCOUNTER — Ambulatory Visit: Payer: PRIVATE HEALTH INSURANCE

## 2012-08-17 VITALS — BP 169/82 | HR 55 | Temp 96.7°F

## 2012-08-17 DIAGNOSIS — Z5189 Encounter for other specified aftercare: Secondary | ICD-10-CM

## 2012-08-17 DIAGNOSIS — C50419 Malignant neoplasm of upper-outer quadrant of unspecified female breast: Secondary | ICD-10-CM

## 2012-08-17 DIAGNOSIS — C50919 Malignant neoplasm of unspecified site of unspecified female breast: Secondary | ICD-10-CM

## 2012-08-17 DIAGNOSIS — C773 Secondary and unspecified malignant neoplasm of axilla and upper limb lymph nodes: Secondary | ICD-10-CM

## 2012-08-17 MED ORDER — PEGFILGRASTIM INJECTION 6 MG/0.6ML
6.0000 mg | Freq: Once | SUBCUTANEOUS | Status: AC
  Administered 2012-08-17: 6 mg via SUBCUTANEOUS

## 2012-08-19 ENCOUNTER — Encounter: Payer: PRIVATE HEALTH INSURANCE | Admitting: Physical Therapy

## 2012-08-21 ENCOUNTER — Ambulatory Visit: Payer: PRIVATE HEALTH INSURANCE | Admitting: Physical Therapy

## 2012-08-23 ENCOUNTER — Ambulatory Visit (HOSPITAL_BASED_OUTPATIENT_CLINIC_OR_DEPARTMENT_OTHER): Payer: PRIVATE HEALTH INSURANCE

## 2012-08-23 ENCOUNTER — Ambulatory Visit: Payer: PRIVATE HEALTH INSURANCE | Admitting: Oncology

## 2012-08-23 ENCOUNTER — Encounter: Payer: Self-pay | Admitting: Adult Health

## 2012-08-23 ENCOUNTER — Other Ambulatory Visit (HOSPITAL_BASED_OUTPATIENT_CLINIC_OR_DEPARTMENT_OTHER): Payer: PRIVATE HEALTH INSURANCE | Admitting: Lab

## 2012-08-23 ENCOUNTER — Other Ambulatory Visit: Payer: PRIVATE HEALTH INSURANCE | Admitting: Lab

## 2012-08-23 ENCOUNTER — Ambulatory Visit: Payer: PRIVATE HEALTH INSURANCE

## 2012-08-23 ENCOUNTER — Ambulatory Visit (HOSPITAL_BASED_OUTPATIENT_CLINIC_OR_DEPARTMENT_OTHER): Payer: PRIVATE HEALTH INSURANCE | Admitting: Adult Health

## 2012-08-23 VITALS — BP 144/84 | HR 120 | Temp 98.3°F | Resp 20 | Ht 60.0 in | Wt 167.0 lb

## 2012-08-23 DIAGNOSIS — C50419 Malignant neoplasm of upper-outer quadrant of unspecified female breast: Secondary | ICD-10-CM

## 2012-08-23 DIAGNOSIS — C50919 Malignant neoplasm of unspecified site of unspecified female breast: Secondary | ICD-10-CM

## 2012-08-23 DIAGNOSIS — Z5112 Encounter for antineoplastic immunotherapy: Secondary | ICD-10-CM

## 2012-08-23 DIAGNOSIS — R29898 Other symptoms and signs involving the musculoskeletal system: Secondary | ICD-10-CM

## 2012-08-23 DIAGNOSIS — C773 Secondary and unspecified malignant neoplasm of axilla and upper limb lymph nodes: Secondary | ICD-10-CM

## 2012-08-23 LAB — CBC WITH DIFFERENTIAL/PLATELET
BASO%: 0.3 % (ref 0.0–2.0)
EOS%: 0.3 % (ref 0.0–7.0)
HCT: 32 % — ABNORMAL LOW (ref 34.8–46.6)
LYMPH%: 31.1 % (ref 14.0–49.7)
MCH: 26.4 pg (ref 25.1–34.0)
MCHC: 31.9 g/dL (ref 31.5–36.0)
MONO%: 14 % (ref 0.0–14.0)
NEUT%: 54.3 % (ref 38.4–76.8)
Platelets: 129 10*3/uL — ABNORMAL LOW (ref 145–400)

## 2012-08-23 LAB — COMPREHENSIVE METABOLIC PANEL (CC13)
ALT: 46 U/L (ref 0–55)
AST: 27 U/L (ref 5–34)
Creatinine: 0.7 mg/dL (ref 0.6–1.1)
Total Bilirubin: 0.58 mg/dL (ref 0.20–1.20)

## 2012-08-23 MED ORDER — TRASTUZUMAB CHEMO INJECTION 440 MG
2.0000 mg/kg | Freq: Once | INTRAVENOUS | Status: AC
  Administered 2012-08-23: 147 mg via INTRAVENOUS
  Filled 2012-08-23: qty 7

## 2012-08-23 MED ORDER — DIPHENHYDRAMINE HCL 25 MG PO CAPS
50.0000 mg | ORAL_CAPSULE | Freq: Once | ORAL | Status: AC
  Administered 2012-08-23: 50 mg via ORAL

## 2012-08-23 MED ORDER — SODIUM CHLORIDE 0.9 % IJ SOLN
10.0000 mL | INTRAMUSCULAR | Status: DC | PRN
  Administered 2012-08-23: 10 mL
  Filled 2012-08-23: qty 10

## 2012-08-23 MED ORDER — HEPARIN SOD (PORK) LOCK FLUSH 100 UNIT/ML IV SOLN
500.0000 [IU] | Freq: Once | INTRAVENOUS | Status: AC | PRN
  Administered 2012-08-23: 500 [IU]
  Filled 2012-08-23: qty 5

## 2012-08-23 MED ORDER — SODIUM CHLORIDE 0.9 % IV SOLN
Freq: Once | INTRAVENOUS | Status: AC
  Administered 2012-08-23: 15:00:00 via INTRAVENOUS

## 2012-08-23 MED ORDER — ACETAMINOPHEN 325 MG PO TABS
650.0000 mg | ORAL_TABLET | Freq: Once | ORAL | Status: AC
  Administered 2012-08-23: 650 mg via ORAL

## 2012-08-23 NOTE — Patient Instructions (Addendum)
Doing well.  Proceed with Herceptin.  Please call us if you have any questions or concerns.    

## 2012-08-23 NOTE — Patient Instructions (Addendum)
[facility] Discharge Instructions for Patients Receiving Chemotherapy  Today you received the following chemotherapy agents Herceptin.  To help prevent nausea and vomiting after your treatment, we encourage you to take your nausea medication as prescribed.   If you develop nausea and vomiting that is not controlled by your nausea medication, call the clinic. If it is after clinic hours your family physician or the after hours number for the clinic or go to the Emergency Department.   BELOW ARE SYMPTOMS THAT SHOULD BE REPORTED IMMEDIATELY:  *FEVER GREATER THAN 100.5 F  *CHILLS WITH OR WITHOUT FEVER  NAUSEA AND VOMITING THAT IS NOT CONTROLLED WITH YOUR NAUSEA MEDICATION  *UNUSUAL SHORTNESS OF BREATH  *UNUSUAL BRUISING OR BLEEDING  TENDERNESS IN MOUTH AND THROAT WITH OR WITHOUT PRESENCE OF ULCERS  *URINARY PROBLEMS  *BOWEL PROBLEMS  UNUSUAL RASH Items with * indicate a potential emergency and should be followed up as soon as possible.  Feel free to call the clinic you have any questions or concerns. The clinic phone number is [phone].   I have been informed and understand all the instructions given to me. I know to contact the clinic, my physician, or go to the Emergency Department if any problems should occur. I do not have any questions at this time, but understand that I may call the clinic during office hours   should I have any questions or need assistance in obtaining follow up care.    __________________________________________  _____________  __________ Signature of Patient or Authorized Representative            Date                   Time    __________________________________________ Nurse's Signature    

## 2012-08-23 NOTE — Progress Notes (Signed)
OFFICE PROGRESS NOTE  CC  Alicia Pena, [name], MD [address] [place] [address] Dr. Emelia Loron Dr. 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 Originally diagnosed in September 2013.  PRIOR THERAPY:  #1Patient originally presented in September 2013 when she noticed dimpling within the left breast. She subsequently had a mammogram that showed suspicious findings. Further workup showed multiple areas of concern. There was noted to be a 2.8 cm spiculated mass within the upper outer quadrant of the left breast. The pathology did show invasive ductal carcinoma from the upper-outer quadrant of the left breast. There was also noted to be a 9 mm enhancing mass which was 2.2 cm anterior to the known area of malignancy. A third mass measuring 8 mm was 2.1 cm anterior to the known area of malignancy. Also a satellite spiculated mass was present 8 mm and a 4 mm rounded mass in the 12:00. She did have additional biopsies and was diagnosed With multicentric disease. There were also noted to be couple of small areas of concern in the right breast but these were negative for malignancy on biopsy.  #2 patient ultimately proceeded to undergo simple mastectomy on the left with sentinel lymph node biopsy on 03/21/2012. The final pathology within the left breast showed multifocal invasive and in situ ductal carcinoma. 4 distinct areas were seen measuring 3.2 cm, 2.3 cm, 1.3 cm, and 0.8 cm. All margins were negative. There was focal lymphatic vascular involvement and normal involvement by tumor. 2 sentinel nodes were positive for metastatic carcinoma with extracapsular extension. The tumors were ER positive PR positive and HER-2/neu positive.  #3 patient did undergo axillary lymph node dissection of the left axilla On 04/04/2012 additional 8 lymph nodes were removed. I have those 3 were positive therefore patient had 5/10 lymph nodes  positive for metastatic disease. With the final pathologic staging at T2 N2a.  #4 patient was begun on adjuvant systemic chemotherapy by Dr. Pierce Crane consisting of Taxotere carboplatinum given every 21 days with Herceptin given on oh weekly basis for the duration of Taxotere and carbo platinum. Her chemotherapy began On 05/03/2012. A total of 6 cycles of TCH combination is planned.  #5 after completion of chemotherapy she will begin radiation therapy and she has RE been seen by Dr. Dorothy Puffer.  CURRENT THERAPY: TCH C5 D8  INTERVAL HISTORY: Asti Pena 42 y.o. female returns for Followup visit after her fifth cycle of TCH.  She is very fatigued and is experiencing insomnia.  She continues to participate in physical therapy and do the exercises at home.  She is not working right now.  She denies fevers, chills, nausea, vomiting, or any other concerns.  MEDICAL HISTORY: Past Medical History  Diagnosis Date  . Anemia   . Anxiety     does not like needles ..   . 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 Breast cancer 03/21/2012    left multifocal invasive, in situ ca,margins not involved,ER/PR=+,metastatic (1/1) node  . Allergy   . Complication of anesthesia     ALLERGIES:  is allergic to duricef.  MEDICATIONS:  Current Outpatient Prescriptions  Medication Sig Dispense Refill  . acetaminophen (TYLENOL) 325 MG tablet Take 650 mg by mouth every 6 (six) hours as needed.      . ALPRAZolam (XANAX) 0.25 MG tablet Take 1 tablet (0.25 mg total) by mouth at bedtime as needed for sleep.  10  tablet  0  . dexamethasone (DECADRON) 4 MG tablet TAKE 2 TABLETS BY MOUTH TWICE DAILY WITH MEALS THE DAY BEFORE CHEMO, THEN FOR 3 DAYS STARTING DAY AFTER CHEMO  30 tablet  0  . ibuprofen (ADVIL,MOTRIN) 200 MG tablet Take 200 mg by mouth every 6 (six) hours as needed.      . lidocaine-prilocaine (EMLA) cream Apply topically as needed.  30 g  2  . LORazepam (ATIVAN) 0.5 MG tablet Take 1 tablet (0.5 mg total) by mouth  every 6 (six) hours as needed (Nausea or vomiting).  30 tablet  0  . LORazepam (ATIVAN) 1 MG tablet       . Multiple Vitamin (MULTIVITAMIN WITH MINERALS) TABS Take 1 tablet by mouth daily.      . ondansetron (ZOFRAN) 8 MG tablet Take 1 tablet (8 mg total) by mouth 2 (two) times daily. Take two times a day starting the day after chemo for 3 days. Then take two times a day as needed for nausea or vomiting.  30 tablet  1  . oxyCODONE-acetaminophen (ROXICET) 5-325 MG per tablet Take 1-2 tablets by mouth every 4 (four) hours as needed for pain.  30 tablet  0  . prochlorperazine (COMPAZINE) 10 MG tablet Take 1 tablet (10 mg total) by mouth every 6 (six) hours as needed (Nausea or vomiting).  30 tablet  1  . prochlorperazine (COMPAZINE) 25 MG suppository Place 1 suppository (25 mg total) rectally every 12 (twelve) hours as needed for nausea.  12 suppository  3   No current facility-administered medications for this visit.   Facility-Administered Medications Ordered in Other Visits  Medication Dose Route Frequency Provider Last Rate Last Dose  . heparin lock flush 100 unit/mL  500 Units Intracatheter Once PRN Victorino December, MD      . sodium chloride 0.9 % injection 10 mL  10 mL Intracatheter PRN Victorino December, MD      . trastuzumab (HERCEPTIN) 147 mg in sodium chloride 0.9 % 250 mL chemo infusion  2 mg/kg (Treatment Plan Actual) Intravenous Once Victorino December, MD 514 mL/hr at 08/23/12 1550 147 mg at 08/23/12 1550    SURGICAL HISTORY:  Past Surgical History  Procedure Laterality Date  . Leg surgery      LLE  . Eye surgery      as a child  . Mastectomy complete / simple w/ sentinel node biopsy  03/21/2012    left Dr.Matthew Dwain Sarna  . Breast biopsy  89/07/2011    left breast uoq bx=invasive ductal ca  . Breast biopsy bi lateral  02/15/12    left = invasive ductal, right breast= fibroadenoma,fibrocystic changes,no atypia,hyperlasia or malignancy identified  . Abdominal hysterectomy  2006    w/o  oophorectomy  . Axillary lymph node dissection  04/04/2012    Procedure: AXILLARY LYMPH NODE DISSECTION;  Surgeon: Emelia Loron, MD;  Location: [facility];  Service: General;  Laterality: Left;  Left Axillary lymph node dissection, Port a cath placement  . Portacath placement  04/04/2012    Procedure: INSERTION PORT-A-CATH;  Surgeon: Emelia Loron, MD;  Location: [facility];  Service: General;  Laterality: Right;    REVIEW OF SYSTEMS:   General: fatigue (+), night sweats (-), fever (-), pain (-) Lymph: palpable nodes (-) HEENT: vision changes (-), mucositis (-), gum bleeding (-), epistaxis (-) Cardiovascular: chest pain (-), palpitations (-) Pulmonary: shortness of breath (-), dyspnea on exertion (-), cough (-), hemoptysis (-) GI:  Early  satiety (-), melena (-), dysphagia (-), nausea/vomiting (-), diarrhea (-) GU: dysuria (-), hematuria (-), incontinence (-) Musculoskeletal: joint swelling (-), joint pain (-), back pain (-) Neuro: weakness (+), numbness (-), headache (-), confusion (-) Skin: Rash (-), lesions (-), dryness (-) Psych: depression (-), suicidal/homicidal ideation (-), feeling of hopelessness (-)    PHYSICAL EXAMINATION: Blood pressure 144/84, pulse 120, temperature 98.3 F (36.8 C), temperature source Oral, resp. rate 20, height 5' (1.524 m), weight 167 lb (75.751 kg). Body mass index is 32.62 kg/(m^2). General: Patient is a well appearing female in no acute distress HEENT: PERRLA, sclerae anicteric no conjunctival pallor, MMM Neck: supple, no palpable adenopathy Lungs: clear to auscultation bilaterally, no wheezes, rhonchi, or rales Cardiovascular: regular rate rhythm, S1, S2, no murmurs, rubs or gallops Abdomen: Soft, non-tender, non-distended, normoactive bowel sounds, no HSM Extremities: warm and well perfused, no clubbing, cyanosis, or edema Skin: No rashes or lesions Neuro: Non-focal ECOG PERFORMANCE STATUS: 1 - Symptomatic but  completely ambulatory   LABORATORY DATA: Lab Results  Component Value Date   WBC 7.5 08/23/2012   HGB 10.2* 08/23/2012   HCT 32.0* 08/23/2012   MCV 82.9 08/23/2012   PLT 129* 08/23/2012      Chemistry      Component Value Date/Time   NA 141 08/16/2012 1323   NA 138 04/02/2012 1100   K 3.9 08/16/2012 1323   K 3.9 04/02/2012 1100   CL 110* 08/16/2012 1323   CL 103 04/02/2012 1100   CO2 22 08/16/2012 1323   CO2 25 04/02/2012 1100   BUN 17.8 08/16/2012 1323   BUN 16 04/02/2012 1100   CREATININE 0.6 08/16/2012 1323   CREATININE 0.49* 04/02/2012 1100      Component Value Date/Time   CALCIUM 9.0 08/16/2012 1323   CALCIUM 9.0 04/02/2012 1100   ALKPHOS 73 08/16/2012 1323   AST 12 08/16/2012 1323   ALT 26 08/16/2012 1323   BILITOT 0.44 08/16/2012 1323       RADIOGRAPHIC STUDIES:  No results found.  ASSESSMENT: 42 year old female with  #1 T2 N2 A. Invasive ductal carcinoma of the left breast that was multifocal she is status post mastectomy with axillary lymph node dissection in September October 2013. Patient's tumor was ER positive PR positive HER-2/neu positive.  #2 she is currently receiving adjuvant chemotherapy consisting of Taxotere carboplatinum and Herceptin total of 6 cycles of therapy is planned. She is here for cycle #4 Of [facility] today . Overall she is tolerating her treatment quite nicely.  #3 patient will need an echocardiogram-1/15 showed EF of 55-60%.  #4 patient is complaining of increasing weakness in her lower extremities. She does have underlying cerebral palsy. And she feels since starting her chemotherapy she has had more weakness. I will refer her to physical therapy.   PLAN:   #1 Proceed with weekly herceptin.  She will continue her physical therapy and exercises.  We discussed healthy diet and exercise.    #2 We will see her back next week for weekly Herceptin.   All questions were answered. The patient knows to call the clinic with any problems, questions or concerns.  We can certainly see the patient much sooner if necessary.  I spent 25 minutes counseling the patient face to face. The total time spent in the appointment was 30 minutes.  Cherie Ouch Lyn Hollingshead, NP Medical Oncology [facility] Phone: [phone]    08/23/2012, 3:53 PM

## 2012-08-26 ENCOUNTER — Telehealth: Payer: Self-pay | Admitting: Oncology

## 2012-08-26 NOTE — Telephone Encounter (Signed)
S/w pt re appt for 3/10 w/Dr. Mitzi Hansen and inf added to 3/7 lb/fu. Pt will get enw schedule when she comes in 2/28.

## 2012-08-30 ENCOUNTER — Encounter: Payer: Self-pay | Admitting: Adult Health

## 2012-08-30 ENCOUNTER — Ambulatory Visit (HOSPITAL_BASED_OUTPATIENT_CLINIC_OR_DEPARTMENT_OTHER): Payer: PRIVATE HEALTH INSURANCE

## 2012-08-30 ENCOUNTER — Ambulatory Visit (HOSPITAL_BASED_OUTPATIENT_CLINIC_OR_DEPARTMENT_OTHER): Payer: PRIVATE HEALTH INSURANCE | Admitting: Adult Health

## 2012-08-30 ENCOUNTER — Ambulatory Visit: Payer: PRIVATE HEALTH INSURANCE

## 2012-08-30 ENCOUNTER — Other Ambulatory Visit (HOSPITAL_BASED_OUTPATIENT_CLINIC_OR_DEPARTMENT_OTHER): Payer: PRIVATE HEALTH INSURANCE | Admitting: Lab

## 2012-08-30 VITALS — BP 128/84 | HR 83 | Temp 97.2°F | Resp 20 | Ht 60.0 in | Wt 166.4 lb

## 2012-08-30 DIAGNOSIS — C50419 Malignant neoplasm of upper-outer quadrant of unspecified female breast: Secondary | ICD-10-CM

## 2012-08-30 DIAGNOSIS — R29898 Other symptoms and signs involving the musculoskeletal system: Secondary | ICD-10-CM

## 2012-08-30 DIAGNOSIS — C50919 Malignant neoplasm of unspecified site of unspecified female breast: Secondary | ICD-10-CM

## 2012-08-30 DIAGNOSIS — Z5112 Encounter for antineoplastic immunotherapy: Secondary | ICD-10-CM

## 2012-08-30 DIAGNOSIS — C773 Secondary and unspecified malignant neoplasm of axilla and upper limb lymph nodes: Secondary | ICD-10-CM

## 2012-08-30 DIAGNOSIS — C50412 Malignant neoplasm of upper-outer quadrant of left female breast: Secondary | ICD-10-CM

## 2012-08-30 LAB — COMPREHENSIVE METABOLIC PANEL (CC13)
AST: 28 U/L (ref 5–34)
Albumin: 3.3 g/dL — ABNORMAL LOW (ref 3.5–5.0)
Alkaline Phosphatase: 81 U/L (ref 40–150)
Calcium: 8.9 mg/dL (ref 8.4–10.4)
Chloride: 110 mEq/L — ABNORMAL HIGH (ref 98–107)
Glucose: 102 mg/dl — ABNORMAL HIGH (ref 70–99)
Potassium: 3.8 mEq/L (ref 3.5–5.1)
Sodium: 145 mEq/L (ref 136–145)
Total Protein: 6.4 g/dL (ref 6.4–8.3)

## 2012-08-30 LAB — CBC WITH DIFFERENTIAL/PLATELET
BASO%: 0.6 % (ref 0.0–2.0)
Basophils Absolute: 0 10*3/uL (ref 0.0–0.1)
EOS%: 0.2 % (ref 0.0–7.0)
HCT: 31.2 % — ABNORMAL LOW (ref 34.8–46.6)
HGB: 9.6 g/dL — ABNORMAL LOW (ref 11.6–15.9)
MCH: 26.5 pg (ref 25.1–34.0)
MONO#: 0.3 10*3/uL (ref 0.1–0.9)
NEUT%: 61.5 % (ref 38.4–76.8)
RDW: 20 % — ABNORMAL HIGH (ref 11.2–14.5)
WBC: 5 10*3/uL (ref 3.9–10.3)
lymph#: 1.6 10*3/uL (ref 0.9–3.3)

## 2012-08-30 MED ORDER — SODIUM CHLORIDE 0.9 % IV SOLN
Freq: Once | INTRAVENOUS | Status: AC
  Administered 2012-08-30: 15:00:00 via INTRAVENOUS

## 2012-08-30 MED ORDER — HEPARIN SOD (PORK) LOCK FLUSH 100 UNIT/ML IV SOLN
500.0000 [IU] | Freq: Once | INTRAVENOUS | Status: AC | PRN
  Administered 2012-08-30: 500 [IU]
  Filled 2012-08-30: qty 5

## 2012-08-30 MED ORDER — ACETAMINOPHEN 325 MG PO TABS
650.0000 mg | ORAL_TABLET | Freq: Once | ORAL | Status: AC
  Administered 2012-08-30: 650 mg via ORAL

## 2012-08-30 MED ORDER — DIPHENHYDRAMINE HCL 25 MG PO CAPS
50.0000 mg | ORAL_CAPSULE | Freq: Once | ORAL | Status: AC
  Administered 2012-08-30: 50 mg via ORAL

## 2012-08-30 MED ORDER — TRASTUZUMAB CHEMO INJECTION 440 MG
2.0000 mg/kg | Freq: Once | INTRAVENOUS | Status: AC
  Administered 2012-08-30: 147 mg via INTRAVENOUS
  Filled 2012-08-30: qty 7

## 2012-08-30 MED ORDER — SODIUM CHLORIDE 0.9 % IJ SOLN
10.0000 mL | INTRAMUSCULAR | Status: DC | PRN
  Administered 2012-08-30: 10 mL
  Filled 2012-08-30: qty 10

## 2012-08-30 NOTE — Patient Instructions (Addendum)
Doing well, proceed with Herceptin and continue your exercises.  We will see you next week.  Please call us if you have any questions or concerns.

## 2012-08-30 NOTE — Progress Notes (Signed)
OFFICE PROGRESS NOTE  CC  Alicia Pena, [name], MD [address] [place] [address] Dr. Emelia Loron Dr. 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 Originally diagnosed in September 2013.  PRIOR THERAPY:  #1Patient originally presented in September 2013 when she noticed dimpling within the left breast. She subsequently had a mammogram that showed suspicious findings. Further workup showed multiple areas of concern. There was noted to be a 2.8 cm spiculated mass within the upper outer quadrant of the left breast. The pathology did show invasive ductal carcinoma from the upper-outer quadrant of the left breast. There was also noted to be a 9 mm enhancing mass which was 2.2 cm anterior to the known area of malignancy. A third mass measuring 8 mm was 2.1 cm anterior to the known area of malignancy. Also a satellite spiculated mass was present 8 mm and a 4 mm rounded mass in the 12:00. She did have additional biopsies and was diagnosed With multicentric disease. There were also noted to be couple of small areas of concern in the right breast but these were negative for malignancy on biopsy.  #2 patient ultimately proceeded to undergo simple mastectomy on the left with sentinel lymph node biopsy on 03/21/2012. The final pathology within the left breast showed multifocal invasive and in situ ductal carcinoma. 4 distinct areas were seen measuring 3.2 cm, 2.3 cm, 1.3 cm, and 0.8 cm. All margins were negative. There was focal lymphatic vascular involvement and normal involvement by tumor. 2 sentinel nodes were positive for metastatic carcinoma with extracapsular extension. The tumors were ER positive PR positive and HER-2/neu positive.  #3 patient did undergo axillary lymph node dissection of the left axilla On 04/04/2012 additional 8 lymph nodes were removed. I have those 3 were positive therefore patient had 5/10 lymph nodes  positive for metastatic disease. With the final pathologic staging at T2 N2a.  #4 patient was begun on adjuvant systemic chemotherapy by Dr. Pierce Crane consisting of Taxotere carboplatinum given every 21 days with Herceptin given on oh weekly basis for the duration of Taxotere and carbo platinum. Her chemotherapy began On 05/03/2012. A total of 6 cycles of TCH combination is planned.  #5 after completion of chemotherapy she will begin radiation therapy and she has RE been seen by Dr. Dorothy Puffer.  CURRENT THERAPY: TCH C5 D15  INTERVAL HISTORY: Alicia Pena 42 y.o. female returns for Followup visit after her fifth cycle of TCH.  She's doing well today.  She continues to do physical therapy twice per week, and does her exercises every other day at home.  She is working, and very excited that she has one more cycle left of chemotherapy.  She is doing well today, and a 10 point ROS is neg.   MEDICAL HISTORY: Past Medical History  Diagnosis Date  . Anemia   . Anxiety     does not like needles ..   . 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 Breast cancer 03/21/2012    left multifocal invasive, in situ ca,margins not involved,ER/PR=+,metastatic (1/1) node  . Allergy   . Complication of anesthesia     ALLERGIES:  is allergic to duricef.  MEDICATIONS:  Current Outpatient Prescriptions  Medication Sig Dispense Refill  . acetaminophen (TYLENOL) 325 MG tablet Take 650 mg by mouth every 6 (six) hours as needed.      . ALPRAZolam (XANAX) 0.25 MG tablet Take 1 tablet (  0.25 mg total) by mouth at bedtime as needed for sleep.  10 tablet  0  . dexamethasone (DECADRON) 4 MG tablet TAKE 2 TABLETS BY MOUTH TWICE DAILY WITH MEALS THE DAY BEFORE CHEMO, THEN FOR 3 DAYS STARTING DAY AFTER CHEMO  30 tablet  0  . ibuprofen (ADVIL,MOTRIN) 200 MG tablet Take 200 mg by mouth every 6 (six) hours as needed.      . lidocaine-prilocaine (EMLA) cream Apply topically as needed.  30 g  2  . LORazepam (ATIVAN) 0.5 MG tablet Take  1 tablet (0.5 mg total) by mouth every 6 (six) hours as needed (Nausea or vomiting).  30 tablet  0  . LORazepam (ATIVAN) 1 MG tablet       . Multiple Vitamin (MULTIVITAMIN WITH MINERALS) TABS Take 1 tablet by mouth daily.      . ondansetron (ZOFRAN) 8 MG tablet Take 1 tablet (8 mg total) by mouth 2 (two) times daily. Take two times a day starting the day after chemo for 3 days. Then take two times a day as needed for nausea or vomiting.  30 tablet  1  . oxyCODONE-acetaminophen (ROXICET) 5-325 MG per tablet Take 1-2 tablets by mouth every 4 (four) hours as needed for pain.  30 tablet  0  . prochlorperazine (COMPAZINE) 10 MG tablet Take 1 tablet (10 mg total) by mouth every 6 (six) hours as needed (Nausea or vomiting).  30 tablet  1  . prochlorperazine (COMPAZINE) 25 MG suppository Place 1 suppository (25 mg total) rectally every 12 (twelve) hours as needed for nausea.  12 suppository  3   No current facility-administered medications for this visit.   Facility-Administered Medications Ordered in Other Visits  Medication Dose Route Frequency Provider Last Rate Last Dose  . heparin lock flush 100 unit/mL  500 Units Intracatheter Once PRN Victorino December, MD      . sodium chloride 0.9 % injection 10 mL  10 mL Intracatheter PRN Victorino December, MD      . trastuzumab (HERCEPTIN) 147 mg in sodium chloride 0.9 % 250 mL chemo infusion  2 mg/kg (Treatment Plan Actual) Intravenous Once Victorino December, MD 514 mL/hr at 08/30/12 1539 147 mg at 08/30/12 1539    SURGICAL HISTORY:  Past Surgical History  Procedure Laterality Date  . Leg surgery      LLE  . Eye surgery      as a child  . Mastectomy complete / simple w/ sentinel node biopsy  03/21/2012    left Dr.Matthew Dwain Sarna  . Breast biopsy  89/07/2011    left breast uoq bx=invasive ductal ca  . Breast biopsy bi lateral  02/15/12    left = invasive ductal, right breast= fibroadenoma,fibrocystic changes,no atypia,hyperlasia or malignancy identified  .  Abdominal hysterectomy  2006    w/o oophorectomy  . Axillary lymph node dissection  04/04/2012    Procedure: AXILLARY LYMPH NODE DISSECTION;  Surgeon: Emelia Loron, MD;  Location: [facility];  Service: General;  Laterality: Left;  Left Axillary lymph node dissection, Port a cath placement  . Portacath placement  04/04/2012    Procedure: INSERTION PORT-A-CATH;  Surgeon: Emelia Loron, MD;  Location: [facility];  Service: General;  Laterality: Right;    REVIEW OF SYSTEMS:   General: fatigue (-), night sweats (-), fever (-), pain (-) Lymph: palpable nodes (-) HEENT: vision changes (-), mucositis (-), gum bleeding (-), epistaxis (-) Cardiovascular: chest pain (-), palpitations (-) Pulmonary: shortness of  breath (-), dyspnea on exertion (-), cough (-), hemoptysis (-) GI:  Early satiety (-), melena (-), dysphagia (-), nausea/vomiting (-), diarrhea (-) GU: dysuria (-), hematuria (-), incontinence (-) Musculoskeletal: joint swelling (-), joint pain (-), back pain (-) Neuro: weakness (+), numbness (-), headache (-), confusion (-) Skin: Rash (-), lesions (-), dryness (-) Psych: depression (-), suicidal/homicidal ideation (-), feeling of hopelessness (-)    PHYSICAL EXAMINATION: Blood pressure 128/84, pulse 83, temperature 97.2 F (36.2 C), temperature source Oral, resp. rate 20, height 5' (1.524 m), weight 166 lb 6.4 oz (75.479 kg). Body mass index is 32.5 kg/(m^2). General: Patient is a well appearing female in no acute distress HEENT: PERRLA, sclerae anicteric no conjunctival pallor, MMM Neck: supple, no palpable adenopathy Lungs: clear to auscultation bilaterally, no wheezes, rhonchi, or rales Cardiovascular: regular rate rhythm, S1, S2, no murmurs, rubs or gallops Abdomen: Soft, non-tender, non-distended, normoactive bowel sounds, no HSM Extremities: warm and well perfused, no clubbing, cyanosis, or edema Skin: No rashes or lesions Neuro:  Non-focal ECOG PERFORMANCE STATUS: 1 - Symptomatic but completely ambulatory   LABORATORY DATA: Lab Results  Component Value Date   WBC 5.0 08/30/2012   HGB 9.6* 08/30/2012   HCT 31.2* 08/30/2012   MCV 86.2 08/30/2012   PLT 121 08/30/2012      Chemistry      Component Value Date/Time   NA 145 08/30/2012 1259   NA 138 04/02/2012 1100   K 3.8 08/30/2012 1259   K 3.9 04/02/2012 1100   CL 110* 08/30/2012 1259   CL 103 04/02/2012 1100   CO2 26 08/30/2012 1259   CO2 25 04/02/2012 1100   BUN 16.5 08/30/2012 1259   BUN 16 04/02/2012 1100   CREATININE 0.7 08/30/2012 1259   CREATININE 0.49* 04/02/2012 1100      Component Value Date/Time   CALCIUM 8.9 08/30/2012 1259   CALCIUM 9.0 04/02/2012 1100   ALKPHOS 81 08/30/2012 1259   AST 28 08/30/2012 1259   ALT 56* 08/30/2012 1259   BILITOT 0.96 08/30/2012 1259       RADIOGRAPHIC STUDIES:  No results found.  ASSESSMENT: 42 year old female with  #1 T2 N2 A. Invasive ductal carcinoma of the left breast that was multifocal she is status post mastectomy with axillary lymph node dissection in September October 2013. Patient's tumor was ER positive PR positive HER-2/neu positive.  #2 she is currently receiving adjuvant chemotherapy consisting of Taxotere carboplatinum and Herceptin total of 6 cycles of therapy is planned. She is here for cycle #4 [facility] today . Overall she is tolerating her treatment quite nicely.  #3 patient will need an echocardiogram-1/15 showed EF of 55-60%.  #4 patient is complaining of increasing weakness in her lower extremities. She does have underlying cerebral palsy. And she feels since starting her chemotherapy she has had more weakness. I will refer her to physical therapy.   PLAN:   #1 Proceed with weekly herceptin.  She will continue her physical therapy and exercises.  We discussed healthy diet and exercise.    #2 We will see her back next week for [facility]   All questions were answered. The patient knows to call the clinic  with any problems, questions or concerns. We can certainly see the patient much sooner if necessary.  I spent 15 minutes counseling the patient face to face. The total time spent in the appointment was 30 minutes.  Cherie Ouch Lyn Hollingshead, NP Medical Oncology [facility] Phone: [phone]  08/30/2012, 4:04 PM

## 2012-08-30 NOTE — Patient Instructions (Addendum)

## 2012-09-05 ENCOUNTER — Other Ambulatory Visit: Payer: PRIVATE HEALTH INSURANCE | Admitting: Lab

## 2012-09-06 ENCOUNTER — Other Ambulatory Visit: Payer: PRIVATE HEALTH INSURANCE | Admitting: Lab

## 2012-09-06 ENCOUNTER — Ambulatory Visit (HOSPITAL_BASED_OUTPATIENT_CLINIC_OR_DEPARTMENT_OTHER): Payer: PRIVATE HEALTH INSURANCE | Admitting: Adult Health

## 2012-09-06 ENCOUNTER — Encounter: Payer: Self-pay | Admitting: Radiation Oncology

## 2012-09-06 ENCOUNTER — Other Ambulatory Visit (HOSPITAL_BASED_OUTPATIENT_CLINIC_OR_DEPARTMENT_OTHER): Payer: PRIVATE HEALTH INSURANCE | Admitting: Lab

## 2012-09-06 ENCOUNTER — Ambulatory Visit (HOSPITAL_BASED_OUTPATIENT_CLINIC_OR_DEPARTMENT_OTHER): Payer: PRIVATE HEALTH INSURANCE

## 2012-09-06 ENCOUNTER — Ambulatory Visit: Payer: PRIVATE HEALTH INSURANCE

## 2012-09-06 VITALS — BP 143/86 | HR 73 | Temp 97.9°F

## 2012-09-06 DIAGNOSIS — C50912 Malignant neoplasm of unspecified site of left female breast: Secondary | ICD-10-CM

## 2012-09-06 DIAGNOSIS — C50919 Malignant neoplasm of unspecified site of unspecified female breast: Secondary | ICD-10-CM

## 2012-09-06 DIAGNOSIS — C50412 Malignant neoplasm of upper-outer quadrant of left female breast: Secondary | ICD-10-CM

## 2012-09-06 DIAGNOSIS — C50419 Malignant neoplasm of upper-outer quadrant of unspecified female breast: Secondary | ICD-10-CM

## 2012-09-06 DIAGNOSIS — Z170 Estrogen receptor positive status [ER+]: Secondary | ICD-10-CM

## 2012-09-06 DIAGNOSIS — Z5111 Encounter for antineoplastic chemotherapy: Secondary | ICD-10-CM

## 2012-09-06 DIAGNOSIS — Z9010 Acquired absence of unspecified breast and nipple: Secondary | ICD-10-CM

## 2012-09-06 DIAGNOSIS — R29898 Other symptoms and signs involving the musculoskeletal system: Secondary | ICD-10-CM

## 2012-09-06 DIAGNOSIS — Z5112 Encounter for antineoplastic immunotherapy: Secondary | ICD-10-CM

## 2012-09-06 LAB — CBC WITH DIFFERENTIAL/PLATELET
BASO%: 0.1 % (ref 0.0–2.0)
Eosinophils Absolute: 0 10*3/uL (ref 0.0–0.5)
LYMPH%: 15.2 % (ref 14.0–49.7)
MCHC: 30.8 g/dL — ABNORMAL LOW (ref 31.5–36.0)
MCV: 86.6 fL (ref 79.5–101.0)
MONO#: 0.8 10*3/uL (ref 0.1–0.9)
MONO%: 8.8 % (ref 0.0–14.0)
NEUT#: 6.7 10*3/uL — ABNORMAL HIGH (ref 1.5–6.5)
Platelets: 230 10*3/uL (ref 145–400)
RBC: 3.67 10*6/uL — ABNORMAL LOW (ref 3.70–5.45)
RDW: 20.7 % — ABNORMAL HIGH (ref 11.2–14.5)
WBC: 8.9 10*3/uL (ref 3.9–10.3)
nRBC: 0 % (ref 0–0)

## 2012-09-06 LAB — COMPREHENSIVE METABOLIC PANEL (CC13)
ALT: 50 U/L (ref 0–55)
AST: 21 U/L (ref 5–34)
Alkaline Phosphatase: 73 U/L (ref 40–150)
CO2: 24 mEq/L (ref 22–29)
Creatinine: 0.7 mg/dL (ref 0.6–1.1)
Sodium: 144 mEq/L (ref 136–145)
Total Bilirubin: 0.97 mg/dL (ref 0.20–1.20)
Total Protein: 6.7 g/dL (ref 6.4–8.3)

## 2012-09-06 MED ORDER — HEPARIN SOD (PORK) LOCK FLUSH 100 UNIT/ML IV SOLN
500.0000 [IU] | Freq: Once | INTRAVENOUS | Status: AC | PRN
  Administered 2012-09-06: 500 [IU]
  Filled 2012-09-06: qty 5

## 2012-09-06 MED ORDER — DEXAMETHASONE SODIUM PHOSPHATE 4 MG/ML IJ SOLN
20.0000 mg | Freq: Once | INTRAMUSCULAR | Status: AC
  Administered 2012-09-06: 20 mg via INTRAVENOUS

## 2012-09-06 MED ORDER — ACETAMINOPHEN 325 MG PO TABS
650.0000 mg | ORAL_TABLET | Freq: Once | ORAL | Status: AC
  Administered 2012-09-06: 650 mg via ORAL

## 2012-09-06 MED ORDER — ONDANSETRON 16 MG/50ML IVPB (CHCC)
16.0000 mg | Freq: Once | INTRAVENOUS | Status: AC
  Administered 2012-09-06: 16 mg via INTRAVENOUS

## 2012-09-06 MED ORDER — TRASTUZUMAB CHEMO INJECTION 440 MG
2.0000 mg/kg | Freq: Once | INTRAVENOUS | Status: AC
  Administered 2012-09-06: 147 mg via INTRAVENOUS
  Filled 2012-09-06: qty 7

## 2012-09-06 MED ORDER — DIPHENHYDRAMINE HCL 25 MG PO CAPS
50.0000 mg | ORAL_CAPSULE | Freq: Once | ORAL | Status: AC
  Administered 2012-09-06: 50 mg via ORAL

## 2012-09-06 MED ORDER — SODIUM CHLORIDE 0.9 % IV SOLN
600.0000 mg | Freq: Once | INTRAVENOUS | Status: AC
  Administered 2012-09-06: 600 mg via INTRAVENOUS
  Filled 2012-09-06: qty 60

## 2012-09-06 MED ORDER — SODIUM CHLORIDE 0.9 % IJ SOLN
10.0000 mL | INTRAMUSCULAR | Status: DC | PRN
  Administered 2012-09-06: 10 mL
  Filled 2012-09-06: qty 10

## 2012-09-06 MED ORDER — SODIUM CHLORIDE 0.9 % IV SOLN
75.0000 mg/m2 | Freq: Once | INTRAVENOUS | Status: AC
  Administered 2012-09-06: 130 mg via INTRAVENOUS
  Filled 2012-09-06: qty 13

## 2012-09-06 MED ORDER — SODIUM CHLORIDE 0.9 % IV SOLN
Freq: Once | INTRAVENOUS | Status: AC
  Administered 2012-09-06: 14:00:00 via INTRAVENOUS

## 2012-09-06 NOTE — Patient Instructions (Addendum)
[facility] Discharge Instructions for Patients Receiving Chemotherapy  Today you received the following chemotherapy agents taxotere, herceptin, carboplatin  To help prevent nausea and vomiting after your treatment, we encourage you to take your nausea medication as needed   If you develop nausea and vomiting that is not controlled by your nausea medication, call the clinic. If it is after clinic hours your family physician or the after hours number for the clinic or go to the Emergency Department.   BELOW ARE SYMPTOMS THAT SHOULD BE REPORTED IMMEDIATELY:  *FEVER GREATER THAN 100.5 F  *CHILLS WITH OR WITHOUT FEVER  NAUSEA AND VOMITING THAT IS NOT CONTROLLED WITH YOUR NAUSEA MEDICATION  *UNUSUAL SHORTNESS OF BREATH  *UNUSUAL BRUISING OR BLEEDING  TENDERNESS IN MOUTH AND THROAT WITH OR WITHOUT PRESENCE OF ULCERS  *URINARY PROBLEMS  *BOWEL PROBLEMS  UNUSUAL RASH Items with * indicate a potential emergency and should be followed up as soon as possible.  . The clinic phone number is [phone].

## 2012-09-07 ENCOUNTER — Ambulatory Visit (HOSPITAL_BASED_OUTPATIENT_CLINIC_OR_DEPARTMENT_OTHER): Payer: PRIVATE HEALTH INSURANCE

## 2012-09-07 VITALS — BP 139/77 | HR 69 | Temp 97.4°F

## 2012-09-07 DIAGNOSIS — C50419 Malignant neoplasm of upper-outer quadrant of unspecified female breast: Secondary | ICD-10-CM

## 2012-09-07 DIAGNOSIS — Z5189 Encounter for other specified aftercare: Secondary | ICD-10-CM

## 2012-09-07 DIAGNOSIS — C50919 Malignant neoplasm of unspecified site of unspecified female breast: Secondary | ICD-10-CM

## 2012-09-07 MED ORDER — PEGFILGRASTIM INJECTION 6 MG/0.6ML
6.0000 mg | Freq: Once | SUBCUTANEOUS | Status: AC
  Administered 2012-09-07: 6 mg via SUBCUTANEOUS

## 2012-09-09 ENCOUNTER — Ambulatory Visit
Admission: RE | Admit: 2012-09-09 | Discharge: 2012-09-09 | Disposition: A | Discharge status: Home / Self Care | Payer: PRIVATE HEALTH INSURANCE | Source: Ambulatory Visit | Attending: Radiation Oncology | Admitting: Radiation Oncology

## 2012-09-09 ENCOUNTER — Other Ambulatory Visit: Payer: Self-pay | Admitting: Certified Registered Nurse Anesthetist

## 2012-09-09 VITALS — BP 121/73 | HR 104 | Temp 97.8°F | Resp 20 | Wt 167.0 lb

## 2012-09-09 DIAGNOSIS — C50412 Malignant neoplasm of upper-outer quadrant of left female breast: Secondary | ICD-10-CM

## 2012-09-09 DIAGNOSIS — C50919 Malignant neoplasm of unspecified site of unspecified female breast: Secondary | ICD-10-CM | POA: Insufficient documentation

## 2012-09-09 NOTE — Progress Notes (Signed)
Radiation Oncology         [id number]) [phone] ________________________________  Name: Dua Mehler MRN: [id number]  Date: 09/09/2012  DOB: [birth year]  Follow-Up Visit Note  CC: Gretel Acre, MD  Victorino December, MD  Diagnosis:   Invasive ductal carcinoma of the left breast, 5 at of 10 nodes positive  Interval Since Last Radiation:  Not applicable   Narrative:  The patient returns today for routine follow-up.  The patient was initially seen prior to proceeding with an axillary lymph node dissection and postoperative chemotherapy. She was felt to be a good candidate for postmastectomy radiotherapy based on her initial presentation. An additional 3 positive lymph nodes were found on dissection. In total, 5/10 lymph nodes were positive. The patient recently finished her cytotoxic chemotherapy on Friday of last week. She is continuing with Herceptin.  The patient indicates that she did well overall with 13 of therapy. She has lost a little bit of strength and has begun physical therapy to                              ALLERGIES:  is allergic to duricef.  Meds: Current Outpatient Prescriptions  Medication Sig Dispense Refill  . acetaminophen (TYLENOL) 325 MG tablet Take 650 mg by mouth every 6 (six) hours as needed.      . ALPRAZolam (XANAX) 0.25 MG tablet Take 1 tablet (0.25 mg total) by mouth at bedtime as needed for sleep.  10 tablet  0  . dexamethasone (DECADRON) 4 MG tablet TAKE 2 TABLETS BY MOUTH TWICE DAILY WITH MEALS THE DAY BEFORE CHEMO, THEN FOR 3 DAYS STARTING DAY AFTER CHEMO  30 tablet  0  . ibuprofen (ADVIL,MOTRIN) 200 MG tablet Take 200 mg by mouth every 6 (six) hours as needed.      . lidocaine-prilocaine (EMLA) cream Apply topically as needed.  30 g  2  . LORazepam (ATIVAN) 0.5 MG tablet Take 1 tablet (0.5 mg total) by mouth every 6 (six) hours as needed (Nausea or vomiting).  30 tablet  0  . LORazepam (ATIVAN) 1 MG tablet       . Multiple Vitamin (MULTIVITAMIN WITH MINERALS) TABS  Take 1 tablet by mouth daily.      . ondansetron (ZOFRAN) 8 MG tablet Take 1 tablet (8 mg total) by mouth 2 (two) times daily. Take two times a day starting the day after chemo for 3 days. Then take two times a day as needed for nausea or vomiting.  30 tablet  1  . oxyCODONE-acetaminophen (ROXICET) 5-325 MG per tablet Take 1-2 tablets by mouth every 4 (four) hours as needed for pain.  30 tablet  0  . prochlorperazine (COMPAZINE) 10 MG tablet Take 1 tablet (10 mg total) by mouth every 6 (six) hours as needed (Nausea or vomiting).  30 tablet  1  . prochlorperazine (COMPAZINE) 25 MG suppository Place 1 suppository (25 mg total) rectally every 12 (twelve) hours as needed for nausea.  12 suppository  3   No current facility-administered medications for this encounter.    Physical Findings: The patient is in no acute distress. Patient is alert and oriented.  weight is 167 lb (75.751 kg). Her oral temperature is 97.8 F (36.6 C). Her blood pressure is 121/73 and her pulse is 104. Her respiration is 20. .     Lab Findings: Lab Results  Component Value Date   WBC 8.9 09/06/2012   HGB 9.8*  09/06/2012   HCT 31.8* 09/06/2012   MCV 86.6 09/06/2012   PLT 230 09/06/2012     Radiographic Findings: No results found.  Impression:    The patient did relatively well with her treatment so far. She is appropriate to proceed with postmastectomy radiotherapy.  Plan:  I discussed with the patient once again a typical course of daily radiotherapy. I would anticipate treating her for 6-1/2 weeks. I discussed with the patient the rationale of this treatment once again. We also discussed the potential side effects and risks. All of her questions were answered. The patient does wish to proceed with simulation.  The patient will be scheduled for simulation in a couple of weeks to give her some additional time to recover from her recent chemotherapy. She will continue Herceptin and also will begin anti-hormonal treatment  after her radiotherapy.  I spent 30 minutes with the patient today, the majority of which was spent counseling the patient on the diagnosis of cancer and coordinating care.    Radene Gunning, M.D., Ph.D.

## 2012-09-09 NOTE — Progress Notes (Signed)
Pt completed chemo on 09/06/12. She is fatigued, states she has been light-headed since Sat. She feels she may be dehydrated but had no n/v due to chemo. Pt denies pain, loss of appetite. Pt to discuss radiation treatment further.

## 2012-09-09 NOTE — Progress Notes (Signed)
Please see the Nurse Progress Note in the MD Initial Consult Encounter for this patient. 

## 2012-09-13 ENCOUNTER — Ambulatory Visit (HOSPITAL_BASED_OUTPATIENT_CLINIC_OR_DEPARTMENT_OTHER): Payer: PRIVATE HEALTH INSURANCE

## 2012-09-13 ENCOUNTER — Telehealth: Payer: Self-pay | Admitting: *Deleted

## 2012-09-13 ENCOUNTER — Other Ambulatory Visit (HOSPITAL_BASED_OUTPATIENT_CLINIC_OR_DEPARTMENT_OTHER): Payer: PRIVATE HEALTH INSURANCE | Admitting: Lab

## 2012-09-13 ENCOUNTER — Encounter: Payer: Self-pay | Admitting: Adult Health

## 2012-09-13 ENCOUNTER — Ambulatory Visit (HOSPITAL_BASED_OUTPATIENT_CLINIC_OR_DEPARTMENT_OTHER): Payer: PRIVATE HEALTH INSURANCE | Admitting: Adult Health

## 2012-09-13 VITALS — BP 137/84 | HR 112 | Temp 98.4°F | Ht 60.0 in | Wt 163.1 lb

## 2012-09-13 DIAGNOSIS — Z5112 Encounter for antineoplastic immunotherapy: Secondary | ICD-10-CM

## 2012-09-13 DIAGNOSIS — C50419 Malignant neoplasm of upper-outer quadrant of unspecified female breast: Secondary | ICD-10-CM

## 2012-09-13 DIAGNOSIS — C50412 Malignant neoplasm of upper-outer quadrant of left female breast: Secondary | ICD-10-CM

## 2012-09-13 DIAGNOSIS — Z170 Estrogen receptor positive status [ER+]: Secondary | ICD-10-CM

## 2012-09-13 DIAGNOSIS — R5381 Other malaise: Secondary | ICD-10-CM

## 2012-09-13 DIAGNOSIS — E860 Dehydration: Secondary | ICD-10-CM

## 2012-09-13 DIAGNOSIS — C50919 Malignant neoplasm of unspecified site of unspecified female breast: Secondary | ICD-10-CM

## 2012-09-13 DIAGNOSIS — E876 Hypokalemia: Secondary | ICD-10-CM

## 2012-09-13 DIAGNOSIS — C50912 Malignant neoplasm of unspecified site of left female breast: Secondary | ICD-10-CM

## 2012-09-13 LAB — COMPREHENSIVE METABOLIC PANEL (CC13)
Alkaline Phosphatase: 83 U/L (ref 40–150)
BUN: 13.6 mg/dL (ref 7.0–26.0)
Creatinine: 0.7 mg/dL (ref 0.6–1.1)
Glucose: 103 mg/dl — ABNORMAL HIGH (ref 70–99)
Sodium: 141 mEq/L (ref 136–145)
Total Bilirubin: 0.75 mg/dL (ref 0.20–1.20)

## 2012-09-13 LAB — CBC WITH DIFFERENTIAL/PLATELET
Basophils Absolute: 0 10*3/uL (ref 0.0–0.1)
EOS%: 0 % (ref 0.0–7.0)
HGB: 9.5 g/dL — ABNORMAL LOW (ref 11.6–15.9)
LYMPH%: 29.9 % (ref 14.0–49.7)
MCH: 26.8 pg (ref 25.1–34.0)
MCV: 84.2 fL (ref 79.5–101.0)
MONO%: 20.2 % — ABNORMAL HIGH (ref 0.0–14.0)
Platelets: 143 10*3/uL — ABNORMAL LOW (ref 145–400)
RDW: 19.6 % — ABNORMAL HIGH (ref 11.2–14.5)

## 2012-09-13 MED ORDER — POTASSIUM CHLORIDE CRYS ER 20 MEQ PO TBCR: 20.0000 meq | EXTENDED_RELEASE_TABLET | Freq: Every day | ORAL | Status: DC

## 2012-09-13 MED ORDER — ACETAMINOPHEN 325 MG PO TABS
650.0000 mg | ORAL_TABLET | Freq: Once | ORAL | Status: AC
  Administered 2012-09-13: 650 mg via ORAL

## 2012-09-13 MED ORDER — SODIUM CHLORIDE 0.9 % IJ SOLN
10.0000 mL | INTRAMUSCULAR | Status: DC | PRN
  Administered 2012-09-13: 10 mL
  Filled 2012-09-13: qty 10

## 2012-09-13 MED ORDER — TRASTUZUMAB CHEMO INJECTION 440 MG
2.0000 mg/kg | Freq: Once | INTRAVENOUS | Status: AC
  Administered 2012-09-13: 147 mg via INTRAVENOUS
  Filled 2012-09-13: qty 7

## 2012-09-13 MED ORDER — HEPARIN SOD (PORK) LOCK FLUSH 100 UNIT/ML IV SOLN
500.0000 [IU] | Freq: Once | INTRAVENOUS | Status: AC | PRN
  Administered 2012-09-13: 500 [IU]
  Filled 2012-09-13: qty 5

## 2012-09-13 MED ORDER — SODIUM CHLORIDE 0.9 % IV SOLN
Freq: Once | INTRAVENOUS | Status: AC
  Administered 2012-09-13: 15:00:00 via INTRAVENOUS

## 2012-09-13 MED ORDER — DIPHENHYDRAMINE HCL 25 MG PO CAPS
50.0000 mg | ORAL_CAPSULE | Freq: Once | ORAL | Status: AC
  Administered 2012-09-13: 50 mg via ORAL

## 2012-09-13 NOTE — Progress Notes (Signed)
OFFICE PROGRESS NOTE  CC  [name], [name], MD [address] [place] [address] Dr. Emelia Loron Dr. 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 Originally diagnosed in September 2013.  PRIOR THERAPY:  #1Patient originally presented in September 2013 when she noticed dimpling within the left breast. She subsequently had a mammogram that showed suspicious findings. Further workup showed multiple areas of concern. There was noted to be a 2.8 cm spiculated mass within the upper outer quadrant of the left breast. The pathology did show invasive ductal carcinoma from the upper-outer quadrant of the left breast. There was also noted to be a 9 mm enhancing mass which was 2.2 cm anterior to the known area of malignancy. A third mass measuring 8 mm was 2.1 cm anterior to the known area of malignancy. Also a satellite spiculated mass was present 8 mm and a 4 mm rounded mass in the 12:00. She did have additional biopsies and was diagnosed With multicentric disease. There were also noted to be couple of small areas of concern in the right breast but these were negative for malignancy on biopsy.  #2 patient ultimately proceeded to undergo simple mastectomy on the left with sentinel lymph node biopsy on 03/21/2012. The final pathology within the left breast showed multifocal invasive and in situ ductal carcinoma. 4 distinct areas were seen measuring 3.2 cm, 2.3 cm, 1.3 cm, and 0.8 cm. All margins were negative. There was focal lymphatic vascular involvement and normal involvement by tumor. 2 sentinel nodes were positive for metastatic carcinoma with extracapsular extension. The tumors were ER positive PR positive and HER-2/neu positive.  #3 patient did undergo axillary lymph node dissection of the left axilla On 04/04/2012 additional 8 lymph nodes were removed. I have those 3 were positive therefore patient had 5/10 lymph nodes  positive for metastatic disease. With the final pathologic staging at T2 N2a.  #4 patient was begun on adjuvant systemic chemotherapy by Dr. Pierce Crane consisting of Taxotere carboplatinum given every 21 days with Herceptin given on oh weekly basis for the duration of Taxotere and carbo platinum. Her chemotherapy began On 05/03/2012. A total of 6 cycles of TCH combination is planned.  #5 after completion of chemotherapy she will begin radiation therapy and she has RE been seen by Dr. Dorothy Puffer.  CURRENT THERAPY: TCH C6 D8 with weekly herceptin.  INTERVAL HISTORY: Alicia Pena 42 y.o. female returns for Followup visit after her sixth cycle of TCH.  She's doing well today.  She continues to do physical therapy twice per week, and does her exercises every other day at home.  She is c/o of headaches that started 2 weeks ago.  They usually occur in the posterior and frontal parts of her head.  They are relieved with Tylenol, however she hasn't had headaches like this before.  Otherwise, she denies fevers, chills, nausea, vomiting.  Her fatigue has remained.    MEDICAL HISTORY: Past Medical History  Diagnosis Date  . Anemia   . Anxiety     does not like needles ..   . 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 Breast cancer 03/21/2012    left multifocal invasive, in situ ca,margins not involved,ER/PR=+,metastatic (1/1) node  . Allergy   . Complication of anesthesia     ALLERGIES:  is allergic to duricef.  MEDICATIONS:  Current Outpatient Prescriptions  Medication Sig Dispense Refill  . acetaminophen (TYLENOL) 325 MG  tablet Take 650 mg by mouth every 6 (six) hours as needed.      . ALPRAZolam (XANAX) 0.25 MG tablet Take 1 tablet (0.25 mg total) by mouth at bedtime as needed for sleep.  10 tablet  0  . dexamethasone (DECADRON) 4 MG tablet TAKE 2 TABLETS BY MOUTH TWICE DAILY WITH MEALS THE DAY BEFORE CHEMO, THEN FOR 3 DAYS STARTING DAY AFTER CHEMO  30 tablet  0  . ibuprofen (ADVIL,MOTRIN) 200 MG  tablet Take 200 mg by mouth every 6 (six) hours as needed.      . lidocaine-prilocaine (EMLA) cream Apply topically as needed.  30 g  2  . LORazepam (ATIVAN) 0.5 MG tablet Take 1 tablet (0.5 mg total) by mouth every 6 (six) hours as needed (Nausea or vomiting).  30 tablet  0  . LORazepam (ATIVAN) 1 MG tablet       . Multiple Vitamin (MULTIVITAMIN WITH MINERALS) TABS Take 1 tablet by mouth daily.      . ondansetron (ZOFRAN) 8 MG tablet Take 1 tablet (8 mg total) by mouth 2 (two) times daily. Take two times a day starting the day after chemo for 3 days. Then take two times a day as needed for nausea or vomiting.  30 tablet  1  . prochlorperazine (COMPAZINE) 10 MG tablet Take 1 tablet (10 mg total) by mouth every 6 (six) hours as needed (Nausea or vomiting).  30 tablet  1  . prochlorperazine (COMPAZINE) 25 MG suppository Place 1 suppository (25 mg total) rectally every 12 (twelve) hours as needed for nausea.  12 suppository  3  . oxyCODONE-acetaminophen (ROXICET) 5-325 MG per tablet Take 1-2 tablets by mouth every 4 (four) hours as needed for pain.  30 tablet  0   No current facility-administered medications for this visit.    SURGICAL HISTORY:  Past Surgical History  Procedure Laterality Date  . Leg surgery      LLE  . Eye surgery      as a child  . Mastectomy complete / simple w/ sentinel node biopsy  03/21/2012    left Dr.Matthew Dwain Sarna  . Breast biopsy  89/07/2011    left breast uoq bx=invasive ductal ca  . Breast biopsy bi lateral  02/15/12    left = invasive ductal, right breast= fibroadenoma,fibrocystic changes,no atypia,hyperlasia or malignancy identified  . Abdominal hysterectomy  2006    w/o oophorectomy  . Axillary lymph node dissection  04/04/2012    Procedure: AXILLARY LYMPH NODE DISSECTION;  Surgeon: Emelia Loron, MD;  Location: [facility];  Service: General;  Laterality: Left;  Left Axillary lymph node dissection, Port a cath placement  . Portacath  placement  04/04/2012    Procedure: INSERTION PORT-A-CATH;  Surgeon: Emelia Loron, MD;  Location: [facility];  Service: General;  Laterality: Right;    REVIEW OF SYSTEMS:   General: fatigue (-), night sweats (-), fever (-), pain (-) Lymph: palpable nodes (-) HEENT: vision changes (-), mucositis (-), gum bleeding (-), epistaxis (-) Cardiovascular: chest pain (-), palpitations (-) Pulmonary: shortness of breath (-), dyspnea on exertion (-), cough (-), hemoptysis (-) GI:  Early satiety (-), melena (-), dysphagia (-), nausea/vomiting (-), diarrhea (-) GU: dysuria (-), hematuria (-), incontinence (-) Musculoskeletal: joint swelling (-), joint pain (-), back pain (-) Neuro: weakness (+), numbness (-), headache (-), confusion (-) Skin: Rash (-), lesions (-), dryness (-) Psych: depression (-), suicidal/homicidal ideation (-), feeling of hopelessness (-)    PHYSICAL EXAMINATION: Blood  pressure 137/84, pulse 112, temperature 98.4 F (36.9 C), temperature source Oral, height 5' (1.524 m), weight 163 lb 1.6 oz (73.982 kg). Body mass index is 31.85 kg/(m^2). General: Patient is a well appearing female in no acute distress HEENT: PERRLA, sclerae anicteric no conjunctival pallor, MMM Neck: supple, no palpable adenopathy Lungs: clear to auscultation bilaterally, no wheezes, rhonchi, or rales Cardiovascular: regular rate rhythm, S1, S2, no murmurs, rubs or gallops Abdomen: Soft, non-tender, non-distended, normoactive bowel sounds, no HSM Extremities: warm and well perfused, no clubbing, cyanosis, or edema Skin: No rashes or lesions Neuro: Non-focal ECOG PERFORMANCE STATUS: 1 - Symptomatic but completely ambulatory   LABORATORY DATA: Lab Results  Component Value Date   WBC 5.1 09/13/2012   HGB 9.5* 09/13/2012   HCT 29.8* 09/13/2012   MCV 84.2 09/13/2012   PLT 143* 09/13/2012      Chemistry      Component Value Date/Time   NA 144 09/06/2012 1238   NA 138 04/02/2012 1100   K  3.2* 09/06/2012 1238   K 3.9 04/02/2012 1100   CL 110* 09/06/2012 1238   CL 103 04/02/2012 1100   CO2 24 09/06/2012 1238   CO2 25 04/02/2012 1100   BUN 23.9 09/06/2012 1238   BUN 16 04/02/2012 1100   CREATININE 0.7 09/06/2012 1238   CREATININE 0.49* 04/02/2012 1100      Component Value Date/Time   CALCIUM 9.1 09/06/2012 1238   CALCIUM 9.0 04/02/2012 1100   ALKPHOS 73 09/06/2012 1238   AST 21 09/06/2012 1238   ALT 50 09/06/2012 1238   BILITOT 0.97 09/06/2012 1238       RADIOGRAPHIC STUDIES:  No results found.  ASSESSMENT: 42 year old female with  #1 T2 N2 A. Invasive ductal carcinoma of the left breast that was multifocal she is status post mastectomy with axillary lymph node dissection in September October 2013. Patient's tumor was ER positive PR positive HER-2/neu positive.  #2 she is currently receiving adjuvant chemotherapy consisting of Taxotere carboplatinum and Herceptin total of 6 cycles of therapy is planned. She is here for cycle #4 Of [facility] today . Overall she is tolerating her treatment quite nicely.  #3 patient will need an echocardiogram-1/15 showed EF of 55-60%.  #4 patient is complaining of increasing weakness in her lower extremities. She does have underlying cerebral palsy. And she feels since starting her chemotherapy she has had more weakness. I will refer her to physical therapy.   PLAN:   #1 Proceed with weekly herceptin.  She will continue her physical therapy and exercises.  We discussed healthy diet and exercise.  We will give her IV fluids for her headaches, as they could be dehydration related.   #2 We will see her back next week for Herceptin.  All questions were answered. The patient knows to call the clinic with any problems, questions or concerns. We can certainly see the patient much sooner if necessary.  I spent 15 minutes counseling the patient face to face. The total time spent in the appointment was 30 minutes.  Cherie Ouch Lyn Hollingshead, NP Medical Oncology [facility] Phone: [phone]    09/13/2012, 1:33 PM

## 2012-09-13 NOTE — Patient Instructions (Addendum)
Doing well.  Proceed with Herceptin.  Please call us if you have any questions or concerns.

## 2012-09-13 NOTE — Patient Instructions (Addendum)
[facility] Discharge Instructions for Patients Receiving Chemotherapy  Today you received the following chemotherapy agents Herceptin  To help prevent nausea and vomiting after your treatment, we encourage you to take your nausea medication     If you develop nausea and vomiting that is not controlled by your nausea medication, call the clinic.   BELOW ARE SYMPTOMS THAT SHOULD BE REPORTED IMMEDIATELY:  *FEVER GREATER THAN 100.5 F  *CHILLS WITH OR WITHOUT FEVER  NAUSEA AND VOMITING THAT IS NOT CONTROLLED WITH YOUR NAUSEA MEDICATION  *UNUSUAL SHORTNESS OF BREATH  *UNUSUAL BRUISING OR BLEEDING  TENDERNESS IN MOUTH AND THROAT WITH OR WITHOUT PRESENCE OF ULCERS  *URINARY PROBLEMS  *BOWEL PROBLEMS  UNUSUAL RASH Items with * indicate a potential emergency and should be followed up as soon as possible.  Feel free to call the clinic you have any questions or concerns. The clinic phone number is [phone].    

## 2012-09-13 NOTE — Telephone Encounter (Signed)
appts made and printed for 4/11,5/2,and 5/23. Also appt was made with besnsimhon/echo.

## 2012-09-16 ENCOUNTER — Other Ambulatory Visit: Payer: Self-pay | Admitting: Certified Registered Nurse Anesthetist

## 2012-09-20 ENCOUNTER — Other Ambulatory Visit (HOSPITAL_BASED_OUTPATIENT_CLINIC_OR_DEPARTMENT_OTHER): Payer: PRIVATE HEALTH INSURANCE | Admitting: Lab

## 2012-09-20 ENCOUNTER — Ambulatory Visit (HOSPITAL_BASED_OUTPATIENT_CLINIC_OR_DEPARTMENT_OTHER): Payer: PRIVATE HEALTH INSURANCE | Admitting: Adult Health

## 2012-09-20 ENCOUNTER — Telehealth: Payer: Self-pay | Admitting: *Deleted

## 2012-09-20 ENCOUNTER — Encounter: Payer: Self-pay | Admitting: Adult Health

## 2012-09-20 ENCOUNTER — Ambulatory Visit (HOSPITAL_BASED_OUTPATIENT_CLINIC_OR_DEPARTMENT_OTHER): Payer: PRIVATE HEALTH INSURANCE

## 2012-09-20 VITALS — BP 127/85 | HR 94 | Temp 97.8°F | Resp 20 | Ht 60.0 in | Wt 169.6 lb

## 2012-09-20 DIAGNOSIS — C50912 Malignant neoplasm of unspecified site of left female breast: Secondary | ICD-10-CM

## 2012-09-20 DIAGNOSIS — C50919 Malignant neoplasm of unspecified site of unspecified female breast: Secondary | ICD-10-CM

## 2012-09-20 DIAGNOSIS — C50412 Malignant neoplasm of upper-outer quadrant of left female breast: Secondary | ICD-10-CM

## 2012-09-20 DIAGNOSIS — Z5112 Encounter for antineoplastic immunotherapy: Secondary | ICD-10-CM

## 2012-09-20 DIAGNOSIS — C50419 Malignant neoplasm of upper-outer quadrant of unspecified female breast: Secondary | ICD-10-CM

## 2012-09-20 DIAGNOSIS — D696 Thrombocytopenia, unspecified: Secondary | ICD-10-CM

## 2012-09-20 DIAGNOSIS — R29898 Other symptoms and signs involving the musculoskeletal system: Secondary | ICD-10-CM

## 2012-09-20 LAB — CBC WITH DIFFERENTIAL/PLATELET
Basophils Absolute: 0 10*3/uL (ref 0.0–0.1)
Eosinophils Absolute: 0 10*3/uL (ref 0.0–0.5)
HGB: 9.1 g/dL — ABNORMAL LOW (ref 11.6–15.9)
NEUT#: 3.3 10*3/uL (ref 1.5–6.5)
RDW: 21.1 % — ABNORMAL HIGH (ref 11.2–14.5)
lymph#: 1.8 10*3/uL (ref 0.9–3.3)

## 2012-09-20 LAB — COMPREHENSIVE METABOLIC PANEL (CC13)
Albumin: 3.1 g/dL — ABNORMAL LOW (ref 3.5–5.0)
Alkaline Phosphatase: 77 U/L (ref 40–150)
CO2: 24 mEq/L (ref 22–29)
Glucose: 103 mg/dl — ABNORMAL HIGH (ref 70–99)
Potassium: 3.7 mEq/L (ref 3.5–5.1)
Sodium: 144 mEq/L (ref 136–145)
Total Protein: 6.2 g/dL — ABNORMAL LOW (ref 6.4–8.3)

## 2012-09-20 MED ORDER — DIPHENHYDRAMINE HCL 25 MG PO CAPS
50.0000 mg | ORAL_CAPSULE | Freq: Once | ORAL | Status: AC
  Administered 2012-09-20: 50 mg via ORAL

## 2012-09-20 MED ORDER — HEPARIN SOD (PORK) LOCK FLUSH 100 UNIT/ML IV SOLN
500.0000 [IU] | Freq: Once | INTRAVENOUS | Status: AC | PRN
  Administered 2012-09-20: 500 [IU]
  Filled 2012-09-20: qty 5

## 2012-09-20 MED ORDER — ACETAMINOPHEN 325 MG PO TABS
650.0000 mg | ORAL_TABLET | Freq: Once | ORAL | Status: AC
  Administered 2012-09-20: 650 mg via ORAL

## 2012-09-20 MED ORDER — SODIUM CHLORIDE 0.9 % IV SOLN
Freq: Once | INTRAVENOUS | Status: AC
  Administered 2012-09-20: 14:00:00 via INTRAVENOUS

## 2012-09-20 MED ORDER — SODIUM CHLORIDE 0.9 % IJ SOLN
10.0000 mL | INTRAMUSCULAR | Status: DC | PRN
  Administered 2012-09-20: 10 mL
  Filled 2012-09-20: qty 10

## 2012-09-20 MED ORDER — TRASTUZUMAB CHEMO INJECTION 440 MG
2.0000 mg/kg | Freq: Once | INTRAVENOUS | Status: AC
  Administered 2012-09-20: 147 mg via INTRAVENOUS
  Filled 2012-09-20: qty 7

## 2012-09-20 NOTE — Patient Instructions (Addendum)
Doing well, proceed with herceptin.  We will check labs on you again next week.  If you develop any blood in your stool, sputum, or from your gums or nose, please call.    Please call us if you have any questions or concerns.

## 2012-09-20 NOTE — Patient Instructions (Signed)
[facility] Discharge Instructions for Patients Receiving Chemotherapy  Today you received the following chemotherapy agents: Herceptin. If you develop any of the following symptoms, call the clinic. If it is after clinic hours your family physician or the after hours number for the clinic or go to the Emergency Department.   BELOW ARE SYMPTOMS THAT SHOULD BE REPORTED IMMEDIATELY:  *FEVER GREATER THAN 100.5 F  *CHILLS WITH OR WITHOUT FEVER  NAUSEA AND VOMITING THAT IS NOT CONTROLLED WITH YOUR NAUSEA MEDICATION  *UNUSUAL SHORTNESS OF BREATH  *UNUSUAL BRUISING OR BLEEDING  TENDERNESS IN MOUTH AND THROAT WITH OR WITHOUT PRESENCE OF ULCERS  *URINARY PROBLEMS  *BOWEL PROBLEMS  UNUSUAL RASH Items with * indicate a potential emergency and should be followed up as soon as possible.  Feel free to call the clinic you have any questions or concerns. The clinic phone number is [phone].   I have been informed and understand all the instructions given to me. I know to contact the clinic, my physician, or go to the Emergency Department if any problems should occur. I do not have any questions at this time, but understand that I may call the clinic during office hours   should I have any questions or need assistance in obtaining follow up care.

## 2012-09-20 NOTE — Progress Notes (Signed)
OFFICE PROGRESS NOTE  CC  [name], [name], MD [address] [place] [address] Dr. Emelia Loron Dr. 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 Originally diagnosed in September 2013.  PRIOR THERAPY:  #1Patient originally presented in September 2013 when she noticed dimpling within the left breast. She subsequently had a mammogram that showed suspicious findings. Further workup showed multiple areas of concern. There was noted to be a 2.8 cm spiculated mass within the upper outer quadrant of the left breast. The pathology did show invasive ductal carcinoma from the upper-outer quadrant of the left breast. There was also noted to be a 9 mm enhancing mass which was 2.2 cm anterior to the known area of malignancy. A third mass measuring 8 mm was 2.1 cm anterior to the known area of malignancy. Also a satellite spiculated mass was present 8 mm and a 4 mm rounded mass in the 12:00. She did have additional biopsies and was diagnosed With multicentric disease. There were also noted to be couple of small areas of concern in the right breast but these were negative for malignancy on biopsy.  #2 patient ultimately proceeded to undergo simple mastectomy on the left with sentinel lymph node biopsy on 03/21/2012. The final pathology within the left breast showed multifocal invasive and in situ ductal carcinoma. 4 distinct areas were seen measuring 3.2 cm, 2.3 cm, 1.3 cm, and 0.8 cm. All margins were negative. There was focal lymphatic vascular involvement and normal involvement by tumor. 2 sentinel nodes were positive for metastatic carcinoma with extracapsular extension. The tumors were ER positive PR positive and HER-2/neu positive.  #3 patient did undergo axillary lymph node dissection of the left axilla On 04/04/2012 additional 8 lymph nodes were removed. I have those 3 were positive therefore patient had 5/10 lymph nodes  positive for metastatic disease. With the final pathologic staging at T2 N2a.  #4 patient was begun on adjuvant systemic chemotherapy by Dr. Pierce Crane consisting of Taxotere carboplatinum given every 21 days with Herceptin given on oh weekly basis for the duration of Taxotere and carbo platinum. Her chemotherapy began On 05/03/2012. A total of 6 cycles of TCH combination is planned.  #5 after completion of chemotherapy she will begin radiation therapy and she has RE been seen by Dr. Dorothy Puffer.  CURRENT THERAPY: TCH C6 D15 with weekly herceptin.  INTERVAL HISTORY: Alicia Pena 42 y.o. female returns for Followup visit.  She's doing well today. Her headaches have resolved.  She still fatigued, she denies fevers, chills, nausea, vomiting, constipation, numbness, easy bruising/bleeding, or any other concerns.  She continues PT twice a week, and doing her take home exercises twice a week.  She continues to work and will start radiation on 3/26, she also has an appt with Dr. Gala Romney on 4/4.   MEDICAL HISTORY: Past Medical History  Diagnosis Date  . Anemia   . Anxiety     does not like needles ..   . 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 Breast cancer 03/21/2012    left multifocal invasive, in situ ca,margins not involved,ER/PR=+,metastatic (1/1) node  . Allergy   . Complication of anesthesia     ALLERGIES:  is allergic to duricef.  MEDICATIONS:  Current Outpatient Prescriptions  Medication Sig Dispense Refill  . acetaminophen (TYLENOL) 325 MG tablet Take 650 mg by mouth every 6 (six) hours as needed.      Marland Kitchen  ALPRAZolam (XANAX) 0.25 MG tablet Take 1 tablet (0.25 mg total) by mouth at bedtime as needed for sleep.  10 tablet  0  . dexamethasone (DECADRON) 4 MG tablet TAKE 2 TABLETS BY MOUTH TWICE DAILY WITH MEALS THE DAY BEFORE CHEMO, THEN FOR 3 DAYS STARTING DAY AFTER CHEMO  30 tablet  0  . ibuprofen (ADVIL,MOTRIN) 200 MG tablet Take 200 mg by mouth every 6 (six) hours as needed.      .  lidocaine-prilocaine (EMLA) cream Apply topically as needed.  30 g  2  . LORazepam (ATIVAN) 1 MG tablet       . Multiple Vitamin (MULTIVITAMIN WITH MINERALS) TABS Take 1 tablet by mouth daily.      Marland Kitchen oxyCODONE-acetaminophen (ROXICET) 5-325 MG per tablet Take 1-2 tablets by mouth every 4 (four) hours as needed for pain.  30 tablet  0  . potassium chloride SA (K-DUR,KLOR-CON) 20 MEQ tablet Take 1 tablet (20 mEq total) by mouth daily.  7 tablet  0   No current facility-administered medications for this visit.   Facility-Administered Medications Ordered in Other Visits  Medication Dose Route Frequency Provider Last Rate Last Dose  . sodium chloride 0.9 % injection 10 mL  10 mL Intracatheter PRN Victorino December, MD   10 mL at 09/13/12 1601    SURGICAL HISTORY:  Past Surgical History  Procedure Laterality Date  . Leg surgery      LLE  . Eye surgery      as a child  . Mastectomy complete / simple w/ sentinel node biopsy  03/21/2012    left Dr.Matthew Dwain Sarna  . Breast biopsy  89/07/2011    left breast uoq bx=invasive ductal ca  . Breast biopsy bi lateral  02/15/12    left = invasive ductal, right breast= fibroadenoma,fibrocystic changes,no atypia,hyperlasia or malignancy identified  . Abdominal hysterectomy  2006    w/o oophorectomy  . Axillary lymph node dissection  04/04/2012    Procedure: AXILLARY LYMPH NODE DISSECTION;  Surgeon: Emelia Loron, MD;  Location: [facility];  Service: General;  Laterality: Left;  Left Axillary lymph node dissection, Port a cath placement  . Portacath placement  04/04/2012    Procedure: INSERTION PORT-A-CATH;  Surgeon: Emelia Loron, MD;  Location: [facility];  Service: General;  Laterality: Right;    REVIEW OF SYSTEMS:   General: fatigue (+), night sweats (-), fever (-), pain (-) Lymph: palpable nodes (-) HEENT: vision changes (-), mucositis (-), gum bleeding (-), epistaxis (-) Cardiovascular: chest pain (-),  palpitations (-) Pulmonary: shortness of breath (-), dyspnea on exertion (-), cough (-), hemoptysis (-) GI:  Early satiety (-), melena (-), dysphagia (-), nausea/vomiting (-), diarrhea (-) GU: dysuria (-), hematuria (-), incontinence (-) Musculoskeletal: joint swelling (-), joint pain (-), back pain (-) Neuro: weakness (+), numbness (-), headache (-), confusion (-) Skin: Rash (-), lesions (-), dryness (-) Psych: depression (-), suicidal/homicidal ideation (-), feeling of hopelessness (-)    PHYSICAL EXAMINATION: Blood pressure 127/85, pulse 94, temperature 97.8 F (36.6 C), temperature source Oral, resp. rate 20, height 5' (1.524 m), weight 169 lb 9.6 oz (76.93 kg). Body mass index is 33.12 kg/(m^2). General: Patient is a well appearing female in no acute distress HEENT: PERRLA, sclerae anicteric no conjunctival pallor, MMM Neck: supple, no palpable adenopathy Lungs: clear to auscultation bilaterally, no wheezes, rhonchi, or rales Cardiovascular: regular rate rhythm, S1, S2, no murmurs, rubs or gallops Abdomen: Soft, non-tender, non-distended, normoactive bowel sounds, no HSM  Extremities: warm and well perfused, no clubbing, cyanosis, or edema Skin: No rashes or lesions Neuro: Non-focal Breasts:  ECOG PERFORMANCE STATUS: 1 - Symptomatic but completely ambulatory   LABORATORY DATA: Lab Results  Component Value Date   WBC 5.5 09/20/2012   HGB 9.1* 09/20/2012   HCT 29.3* 09/20/2012   MCV 87.7 09/20/2012   PLT 63* 09/20/2012      Chemistry      Component Value Date/Time   NA 141 09/13/2012 1219   NA 138 04/02/2012 1100   K 3.0 Repeated and Verified* 09/13/2012 1219   K 3.9 04/02/2012 1100   CL 106 09/13/2012 1219   CL 103 04/02/2012 1100   CO2 26 09/13/2012 1219   CO2 25 04/02/2012 1100   BUN 13.6 09/13/2012 1219   BUN 16 04/02/2012 1100   CREATININE 0.7 09/13/2012 1219   CREATININE 0.49* 04/02/2012 1100      Component Value Date/Time   CALCIUM 8.8 09/13/2012 1219   CALCIUM 9.0  04/02/2012 1100   ALKPHOS 83 09/13/2012 1219   AST 19 09/13/2012 1219   ALT 35 09/13/2012 1219   BILITOT 0.75 09/13/2012 1219       RADIOGRAPHIC STUDIES:  No results found.  ASSESSMENT: 42 year old female with  #1 T2 N2 A. Invasive ductal carcinoma of the left breast that was multifocal she is status post mastectomy with axillary lymph node dissection in September October 2013. Patient's tumor was ER positive PR positive HER-2/neu positive.  #2 she is currently receiving adjuvant chemotherapy consisting of Taxotere carboplatinum and Herceptin total of 6 cycles of therapy is planned. She is here for cycle #4 Of [facility] today . Overall she is tolerating her treatment quite nicely.  #3 patient will need an echocardiogram-1/15 showed EF of 55-60%.  #4 patient is complaining of increasing weakness in her lower extremities. She does have underlying cerebral palsy. And she feels since starting her chemotherapy she has had more weakness. I will refer her to physical therapy.   PLAN:   #1 Proceed with weekly herceptin.  She will continue her physical therapy and exercises.    #2 She will return next week for a lab only due to the thrombocytopenia.  Her next appt is in 3 weeks.    All questions were answered. The patient knows to call the clinic with any problems, questions or concerns. We can certainly see the patient much sooner if necessary.  I spent 25 minutes counseling the patient face to face. The total time spent in the appointment was 30 minutes.  Cherie Ouch Lyn Hollingshead, NP Medical Oncology [facility] Phone: [phone]    09/20/2012, 1:12 PM

## 2012-09-20 NOTE — Telephone Encounter (Signed)
appts made and printed 

## 2012-09-23 ENCOUNTER — Encounter: Payer: Self-pay | Admitting: Oncology

## 2012-09-23 ENCOUNTER — Other Ambulatory Visit: Payer: Self-pay | Admitting: Adult Health

## 2012-09-23 DIAGNOSIS — C50912 Malignant neoplasm of unspecified site of left female breast: Secondary | ICD-10-CM

## 2012-09-24 ENCOUNTER — Other Ambulatory Visit: Payer: Self-pay | Admitting: Certified Registered Nurse Anesthetist

## 2012-09-25 ENCOUNTER — Other Ambulatory Visit: Payer: PRIVATE HEALTH INSURANCE | Admitting: Lab

## 2012-09-25 ENCOUNTER — Ambulatory Visit: Payer: PRIVATE HEALTH INSURANCE | Admitting: Radiation Oncology

## 2012-09-27 ENCOUNTER — Ambulatory Visit: Payer: PRIVATE HEALTH INSURANCE | Admitting: Radiation Oncology

## 2012-09-27 ENCOUNTER — Other Ambulatory Visit (HOSPITAL_BASED_OUTPATIENT_CLINIC_OR_DEPARTMENT_OTHER): Payer: PRIVATE HEALTH INSURANCE | Admitting: Lab

## 2012-09-27 ENCOUNTER — Telehealth: Payer: Self-pay | Admitting: Radiation Oncology

## 2012-09-27 ENCOUNTER — Ambulatory Visit
Admission: RE | Admit: 2012-09-27 | Discharge: 2012-09-27 | Disposition: A | Discharge status: Home / Self Care | Payer: PRIVATE HEALTH INSURANCE | Source: Ambulatory Visit | Attending: Radiation Oncology | Admitting: Radiation Oncology

## 2012-09-27 DIAGNOSIS — D696 Thrombocytopenia, unspecified: Secondary | ICD-10-CM

## 2012-09-27 DIAGNOSIS — C773 Secondary and unspecified malignant neoplasm of axilla and upper limb lymph nodes: Secondary | ICD-10-CM

## 2012-09-27 DIAGNOSIS — R5381 Other malaise: Secondary | ICD-10-CM | POA: Insufficient documentation

## 2012-09-27 DIAGNOSIS — L819 Disorder of pigmentation, unspecified: Secondary | ICD-10-CM | POA: Insufficient documentation

## 2012-09-27 DIAGNOSIS — C50412 Malignant neoplasm of upper-outer quadrant of left female breast: Secondary | ICD-10-CM

## 2012-09-27 DIAGNOSIS — C50419 Malignant neoplasm of upper-outer quadrant of unspecified female breast: Secondary | ICD-10-CM

## 2012-09-27 DIAGNOSIS — C50919 Malignant neoplasm of unspecified site of unspecified female breast: Secondary | ICD-10-CM | POA: Insufficient documentation

## 2012-09-27 DIAGNOSIS — Z510 Encounter for antineoplastic radiation therapy: Secondary | ICD-10-CM | POA: Insufficient documentation

## 2012-09-27 LAB — CBC WITH DIFFERENTIAL/PLATELET
Basophils Absolute: 0 10*3/uL (ref 0.0–0.1)
Eosinophils Absolute: 0 10*3/uL (ref 0.0–0.5)
HGB: 8.9 g/dL — ABNORMAL LOW (ref 11.6–15.9)
LYMPH%: 27 % (ref 14.0–49.7)
MCV: 87.1 fL (ref 79.5–101.0)
MONO#: 0.5 10*3/uL (ref 0.1–0.9)
MONO%: 9.8 % (ref 0.0–14.0)
NEUT#: 3.2 10*3/uL (ref 1.5–6.5)
Platelets: 156 10*3/uL (ref 145–400)
RBC: 3.26 10*6/uL — ABNORMAL LOW (ref 3.70–5.45)
WBC: 5.1 10*3/uL (ref 3.9–10.3)
nRBC: 0 % (ref 0–0)

## 2012-09-27 NOTE — Telephone Encounter (Signed)
Met w patient to discuss RO billing. Pt had no financial concerns today. However, did advise, she has a cancer policy that will need attention soon.  Dx: Breast cancer, left - Primary 174.9   Attending Rad: JM   Rad Tx: Daily

## 2012-09-30 ENCOUNTER — Telehealth: Payer: Self-pay | Admitting: Medical Oncology

## 2012-09-30 NOTE — Progress Notes (Signed)
  Radiation Oncology         [phone] ________________________________  Name: Alicia Pena MRN: [id number]  Date: 09/27/2012  DOB: Dec 16, 1970   SIMULATION AND TREATMENT PLANNING NOTE  The patient presented for simulation prior to beginning her course of radiation treatment for her diagnosis of left-sided breast cancer. The patient was placed in a supine position on a breast board. A customized accuform device was also constructed and this complex treatment device will be used on a daily basis during her treatment. In this fashion, a CT scan was obtained through the chest area and an isocenter was placed near the chest wall at the upper aspect of the leftchest.  The patient will be planned to receive a course of radiation initially to a dose of 50.4 gray. This will consist of a 4 field technique targeting the chest wall as well as the supraclavicular region. Therefore 2 customized medial and lateral tangent fields have been created targeting the chest wall, and also 2 additional customized fields have been designed to treat the supraclavicular region both with a right supraclavicular field and a right posterior axillary boost field. This treatment will be accomplished at 1.8 gray per fraction. A 3D conformal treatment plan is ordered to ensure that the target area is adequately covered dosimetrically. Dose volume histograms of the target, lungs, and heart have been requested. These will be carefully reviewed as part of the 3-D conformal treatment planning process. A forward planning technique will also be evaluated to determine if this approach improves the plan. It is anticipated that the patient will then receive a 10 gray boost to the scar plus margin. This will be accomplished at 2 gray per fraction. The final anticipated total dose therefore will correspond to 60.4 gray.    _______________________________   Radene Gunning, MD, PhD

## 2012-09-30 NOTE — Progress Notes (Signed)
  Radiation Oncology         [phone]) [phone] ________________________________  Name: Alicia Pena MRN: [id number]  Date: 09/27/2012  DOB: [birth year]  RESPIRATORY MOTION MANAGEMENT SIMULATION  NARRATIVE:  In order to account for effect of respiratory motion on target structures and other organs in the planning and delivery of radiotherapy, this patient underwent respiratory motion management simulation.  To accomplish this, when the patient was brought to the CT simulation planning suite, 4D respiratoy motion management CT images were obtained.  The CT images were loaded into the planning software.  Then, using a variety of tools including Cine, MIP, and standard views, the target volume and planning target volumes (PTV) were delineated.  Avoidance structures were contoured.  Treatment planning then occurred.  Dose volume histograms were generated and reviewed for each of the requested structure.  The resulting plan was carefully reviewed and approved today.   Dorothy Puffer, MD, PhD

## 2012-09-30 NOTE — Telephone Encounter (Signed)
Patient called with concern over labs drawn at her place of employment, reports that "liver function results are elevated" asking what "is the current plan to make them better?" Patient faxed over results to office for review. Dr Welton Flakes reviewed results and per Dr Welton Flakes informed patient not to worry [facility]), results to improve after chemotherapy tx completed. Patient to call office with any further questions or concerns.

## 2012-10-01 ENCOUNTER — Telehealth: Payer: Self-pay | Admitting: Radiation Oncology

## 2012-10-01 NOTE — Telephone Encounter (Signed)
Faxed clinicals to Rushie Chestnut, Oncology Coordinator w [employer] on behalf of pt's rad tx.   Melissa contact info:  [phone]    Fx: [phone]   Ref Nbr: [id number]     COPY SCANNED

## 2012-10-04 ENCOUNTER — Ambulatory Visit (HOSPITAL_COMMUNITY)
Admission: RE | Admit: 2012-10-04 | Discharge: 2012-10-04 | Disposition: A | Discharge status: Home / Self Care | Payer: PRIVATE HEALTH INSURANCE | Source: Ambulatory Visit | Attending: Internal Medicine | Admitting: Internal Medicine

## 2012-10-04 ENCOUNTER — Encounter (HOSPITAL_COMMUNITY): Payer: Self-pay

## 2012-10-04 ENCOUNTER — Ambulatory Visit
Admission: RE | Admit: 2012-10-04 | Discharge: 2012-10-04 | Disposition: A | Discharge status: Home / Self Care | Payer: PRIVATE HEALTH INSURANCE | Source: Ambulatory Visit | Attending: Radiation Oncology | Admitting: Radiation Oncology

## 2012-10-04 ENCOUNTER — Ambulatory Visit (HOSPITAL_BASED_OUTPATIENT_CLINIC_OR_DEPARTMENT_OTHER)
Admission: RE | Admit: 2012-10-04 | Discharge: 2012-10-04 | Disposition: A | Discharge status: Home / Self Care | Payer: PRIVATE HEALTH INSURANCE | Source: Ambulatory Visit | Attending: Internal Medicine | Admitting: Internal Medicine

## 2012-10-04 VITALS — BP 128/92 | HR 74 | Ht 60.0 in | Wt 170.0 lb

## 2012-10-04 DIAGNOSIS — C50419 Malignant neoplasm of upper-outer quadrant of unspecified female breast: Secondary | ICD-10-CM | POA: Insufficient documentation

## 2012-10-04 DIAGNOSIS — R600 Localized edema: Secondary | ICD-10-CM

## 2012-10-04 DIAGNOSIS — D649 Anemia, unspecified: Secondary | ICD-10-CM | POA: Insufficient documentation

## 2012-10-04 DIAGNOSIS — Z9010 Acquired absence of unspecified breast and nipple: Secondary | ICD-10-CM | POA: Insufficient documentation

## 2012-10-04 DIAGNOSIS — Z170 Estrogen receptor positive status [ER+]: Secondary | ICD-10-CM | POA: Insufficient documentation

## 2012-10-04 DIAGNOSIS — Z79899 Other long term (current) drug therapy: Secondary | ICD-10-CM | POA: Insufficient documentation

## 2012-10-04 DIAGNOSIS — Z5111 Encounter for antineoplastic chemotherapy: Secondary | ICD-10-CM

## 2012-10-04 DIAGNOSIS — G809 Cerebral palsy, unspecified: Secondary | ICD-10-CM | POA: Insufficient documentation

## 2012-10-04 DIAGNOSIS — C50412 Malignant neoplasm of upper-outer quadrant of left female breast: Secondary | ICD-10-CM

## 2012-10-04 DIAGNOSIS — Z90710 Acquired absence of both cervix and uterus: Secondary | ICD-10-CM | POA: Insufficient documentation

## 2012-10-04 DIAGNOSIS — F411 Generalized anxiety disorder: Secondary | ICD-10-CM | POA: Insufficient documentation

## 2012-10-04 DIAGNOSIS — R609 Edema, unspecified: Secondary | ICD-10-CM

## 2012-10-04 NOTE — Patient Instructions (Addendum)
Follow up in 3 months with an ECHO 

## 2012-10-04 NOTE — Assessment & Plan Note (Signed)
Likely dependent edema. Can use lasix 20mg  prn as needed.

## 2012-10-04 NOTE — Progress Notes (Signed)
  Echocardiogram 2D Echocardiogram has been performed.  Shannin Naab 10/04/2012, 9:43 AM

## 2012-10-04 NOTE — Assessment & Plan Note (Addendum)
She presents today as a new patient at the request of Dr Welton Flakes for cardio-onc clinic. Explained that there is ~ 10 percent chance of cardiotoxicity related to herceptin treatment. Dr Gala Romney discussed reviewed ECHO. EF and lateral S' stable to continue Herceptin. Follow in 3 months with an ECHO.   Echo images reviewed personally. All parameters stable. Reviewed signs and symptoms of HF to look for as well as risk of cardiotoxicity with Herceptin.. Continue Herceptin. Follow-up with echo in 3 months.

## 2012-10-04 NOTE — Progress Notes (Signed)
Patient ID: Alicia Pena, female   DOB: 12/14/1970, 42 y.o.   MRN: [id number] Oncologist: Dr. 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   She was treated with taxotere and carboplatinum. Now on herceptin q 3 weeks. Began on 05/03/2012. She will continue Herceptin until 04/2013  ECHO 04/09/2012 EF 55-60% lateral S' ECHO 07/17/2012 EF 55-65% lateral S' 10.1 ECHO 10/04/12 EF 55% lateral S' 10.0  She presents today as a new patient at the request of Dr Welton Flakes due to breast cancer treatment. Ambulates with a walker. Lives with Mom. Works full time in Clinical biochemist. No h/o heart disease. No CP or SOB. Occasional LE edema.   Review of Systems:     Cardiac Review of Systems: {Y] = yes [ ]  = no  Chest Pain [    ]  Resting SOB [   ] Exertional SOB  [  ]  Orthopnea [  ]   Pedal Edema [   ]    Palpitations [  ] Syncope  [  ]   Presyncope [   ]  General Review of Systems: [Y] = yes [  ]=no Constitional: recent weight change [ Y ]; anorexia [  ]; fatigue [Y  ]; nausea [  ]; night sweats [  ]; fever [  ]; or chills [  ];                                                                                                                                          Dental: poor dentition[  ]; Last Dentist visit:   Eye : blurred vision [  ]; diplopia [   ]; vision changes [  ];  Amaurosis fugax[  ]; Resp: cough [  ];  wheezing[  ];  hemoptysis[  ]; shortness of breath[  ]; paroxysmal nocturnal dyspnea[  ]; dyspnea on exertion[  ]; or orthopnea[  ];  GI:  gallstones[  ], vomiting[  ];  dysphagia[  ]; melena[  ];  hematochezia [  ]; heartburn[  ];   Hx of  Colonoscopy[  ]; GU: kidney stones [  ]; hematuria[  ];   dysuria [  ];  nocturia[  ];  history of     obstruction [  ];                 Skin: rash, swelling[  ];, hair loss[  ];   peripheral edema[  ];  or itching[  ]; Musculosketetal: myalgias[  ];  joint swelling[Y  ];  joint erythema[  ];  joint pain[  ];  back pain[  ];  Heme/Lymph: bruising[  ];  bleeding[  ];  anemia[  ];  Neuro: TIA[  ];  headaches[  ];  stroke[  ];  vertigo[  ];  seizures[  ];   paresthesias[  ];  difficulty walking[  ];  Psych:depression[  ]; anxiety[  ];  Endocrine: diabetes[  ];  thyroid dysfunction[  ];  Immunizations: Flu [  ]; Pneumococcal[  ];  Other:    Past Medical History  Diagnosis Date  . Anemia   . Anxiety     does not like needles ..   . 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 Breast cancer 03/21/2012    left multifocal invasive, in situ ca,margins not involved,ER/PR=+,metastatic (1/1) node  . Allergy   . Complication of anesthesia     Current Outpatient Prescriptions  Medication Sig Dispense Refill  . acetaminophen (TYLENOL) 325 MG tablet Take 650 mg by mouth every 6 (six) hours as needed.      Marland Kitchen ibuprofen (ADVIL,MOTRIN) 200 MG tablet Take 200 mg by mouth every 6 (six) hours as needed.      . lidocaine-prilocaine (EMLA) cream Apply topically as needed.  30 g  2  . LORazepam (ATIVAN) 1 MG tablet       . Multiple Vitamin (MULTIVITAMIN WITH MINERALS) TABS Take 1 tablet by mouth daily.      Marland Kitchen oxyCODONE-acetaminophen (ROXICET) 5-325 MG per tablet Take 1-2 tablets by mouth every 4 (four) hours as needed for pain.  30 tablet  0  . ALPRAZolam (XANAX) 0.25 MG tablet Take 1 tablet (0.25 mg total) by mouth at bedtime as needed for sleep.  10 tablet  0   No current facility-administered medications for this encounter.     Allergies  Allergen Reactions  . Duricef (Cefadroxil) Hives and Nausea Only    History   Social History  . Marital Status: Single    Spouse Name: N/A    Number of Children: N/A  . Years of Education: N/A   Occupational History  . Not on file.   Social History Main Topics  . Smoking status: Never Smoker   . Smokeless tobacco: Never Used  . Alcohol  Use: No     Comment: used birth control pills 6 months,G)PO  . Drug Use: No  . Sexually Active: No   Other Topics Concern  . Not on file   Social History Narrative  . No narrative on file    Family History  Problem Relation Age of Onset  . Cancer Father 13    lung  . Diabetes Maternal Grandmother   . Hyperlipidemia Maternal Grandmother   . Hypertension Maternal Grandmother     PHYSICAL EXAM: Filed Vitals:   10/04/12 0915  BP: 128/92  Pulse: 74   General:  Well appearing. No respiratory difficulty HEENT: normal Neck: supple. no JVD. Carotids 2+ bilat; no bruits. No lymphadenopathy or thryomegaly appreciated. Cor: PMI nondisplaced. Regular rate & rhythm. No rubs, gallops or murmurs. Lungs: clear Abdomen: soft, nontender, nondistended. No hepatosplenomegaly. No bruits or masses. Good bowel sounds. Extremities: no cyanosis, clubbing, rash, tr edema Neuro: alert & oriented x 3, cranial nerves grossly intact. moves all 4 extremities w/o difficulty. Affect pleasant.   ASSESSMENT & PLAN:

## 2012-10-07 ENCOUNTER — Ambulatory Visit: Admission: RE | Admit: 2012-10-07 | Payer: PRIVATE HEALTH INSURANCE | Source: Ambulatory Visit

## 2012-10-08 ENCOUNTER — Ambulatory Visit
Admission: RE | Admit: 2012-10-08 | Discharge: 2012-10-08 | Disposition: A | Discharge status: Home / Self Care | Payer: PRIVATE HEALTH INSURANCE | Source: Ambulatory Visit | Attending: Radiation Oncology | Admitting: Radiation Oncology

## 2012-10-09 ENCOUNTER — Ambulatory Visit
Admission: RE | Admit: 2012-10-09 | Discharge: 2012-10-09 | Disposition: A | Discharge status: Home / Self Care | Payer: PRIVATE HEALTH INSURANCE | Source: Ambulatory Visit | Attending: Radiation Oncology | Admitting: Radiation Oncology

## 2012-10-10 ENCOUNTER — Ambulatory Visit
Admission: RE | Admit: 2012-10-10 | Discharge: 2012-10-10 | Disposition: A | Discharge status: Home / Self Care | Payer: PRIVATE HEALTH INSURANCE | Source: Ambulatory Visit | Attending: Radiation Oncology | Admitting: Radiation Oncology

## 2012-10-11 ENCOUNTER — Telehealth: Payer: Self-pay | Admitting: *Deleted

## 2012-10-11 ENCOUNTER — Other Ambulatory Visit (HOSPITAL_BASED_OUTPATIENT_CLINIC_OR_DEPARTMENT_OTHER): Payer: PRIVATE HEALTH INSURANCE | Admitting: Lab

## 2012-10-11 ENCOUNTER — Ambulatory Visit: Payer: PRIVATE HEALTH INSURANCE | Admitting: Adult Health

## 2012-10-11 ENCOUNTER — Encounter: Payer: Self-pay | Admitting: Adult Health

## 2012-10-11 ENCOUNTER — Ambulatory Visit
Admission: RE | Admit: 2012-10-11 | Discharge: 2012-10-11 | Disposition: A | Discharge status: Home / Self Care | Payer: PRIVATE HEALTH INSURANCE | Source: Ambulatory Visit | Attending: Radiation Oncology | Admitting: Radiation Oncology

## 2012-10-11 ENCOUNTER — Encounter: Payer: Self-pay | Admitting: Radiation Oncology

## 2012-10-11 ENCOUNTER — Ambulatory Visit (HOSPITAL_BASED_OUTPATIENT_CLINIC_OR_DEPARTMENT_OTHER): Payer: PRIVATE HEALTH INSURANCE

## 2012-10-11 ENCOUNTER — Ambulatory Visit (HOSPITAL_BASED_OUTPATIENT_CLINIC_OR_DEPARTMENT_OTHER): Payer: PRIVATE HEALTH INSURANCE | Admitting: Adult Health

## 2012-10-11 VITALS — BP 112/85 | HR 78 | Temp 97.3°F | Resp 20 | Wt 168.3 lb

## 2012-10-11 DIAGNOSIS — C50412 Malignant neoplasm of upper-outer quadrant of left female breast: Secondary | ICD-10-CM

## 2012-10-11 DIAGNOSIS — C50419 Malignant neoplasm of upper-outer quadrant of unspecified female breast: Secondary | ICD-10-CM

## 2012-10-11 DIAGNOSIS — C50912 Malignant neoplasm of unspecified site of left female breast: Secondary | ICD-10-CM

## 2012-10-11 DIAGNOSIS — R5383 Other fatigue: Secondary | ICD-10-CM

## 2012-10-11 DIAGNOSIS — Z170 Estrogen receptor positive status [ER+]: Secondary | ICD-10-CM

## 2012-10-11 DIAGNOSIS — C50919 Malignant neoplasm of unspecified site of unspecified female breast: Secondary | ICD-10-CM

## 2012-10-11 DIAGNOSIS — C773 Secondary and unspecified malignant neoplasm of axilla and upper limb lymph nodes: Secondary | ICD-10-CM

## 2012-10-11 DIAGNOSIS — Z5112 Encounter for antineoplastic immunotherapy: Secondary | ICD-10-CM

## 2012-10-11 LAB — CBC WITH DIFFERENTIAL/PLATELET
Eosinophils Absolute: 0.2 10*3/uL (ref 0.0–0.5)
LYMPH%: 21.2 % (ref 14.0–49.7)
MONO#: 0.6 10*3/uL (ref 0.1–0.9)
NEUT#: 4 10*3/uL (ref 1.5–6.5)
Platelets: 244 10*3/uL (ref 145–400)
RBC: 3.51 10*6/uL — ABNORMAL LOW (ref 3.70–5.45)
RDW: 23.4 % — ABNORMAL HIGH (ref 11.2–14.5)
WBC: 6.1 10*3/uL (ref 3.9–10.3)

## 2012-10-11 MED ORDER — SODIUM CHLORIDE 0.9 % IV SOLN
Freq: Once | INTRAVENOUS | Status: AC
  Administered 2012-10-11: 11:00:00 via INTRAVENOUS

## 2012-10-11 MED ORDER — DIPHENHYDRAMINE HCL 25 MG PO CAPS
50.0000 mg | ORAL_CAPSULE | Freq: Once | ORAL | Status: AC
  Administered 2012-10-11: 50 mg via ORAL

## 2012-10-11 MED ORDER — ACETAMINOPHEN 325 MG PO TABS
650.0000 mg | ORAL_TABLET | Freq: Once | ORAL | Status: AC
  Administered 2012-10-11: 650 mg via ORAL

## 2012-10-11 MED ORDER — ALRA NON-METALLIC DEODORANT (RAD-ONC): 1.0000 "application " | Freq: Once | TOPICAL | Status: DC

## 2012-10-11 MED ORDER — TRASTUZUMAB CHEMO INJECTION 440 MG
6.0000 mg/kg | Freq: Once | INTRAVENOUS | Status: AC
  Administered 2012-10-11: 462 mg via INTRAVENOUS
  Filled 2012-10-11: qty 22

## 2012-10-11 MED ORDER — SODIUM CHLORIDE 0.9 % IJ SOLN
10.0000 mL | INTRAMUSCULAR | Status: DC | PRN
  Administered 2012-10-11: 10 mL
  Filled 2012-10-11: qty 10

## 2012-10-11 MED ORDER — HEPARIN SOD (PORK) LOCK FLUSH 100 UNIT/ML IV SOLN
500.0000 [IU] | Freq: Once | INTRAVENOUS | Status: AC | PRN
  Administered 2012-10-11: 500 [IU]
  Filled 2012-10-11: qty 5

## 2012-10-11 MED ORDER — RADIAPLEXRX EX GEL
Freq: Once | CUTANEOUS | Status: AC
  Administered 2012-10-11: 09:00:00 via TOPICAL

## 2012-10-11 NOTE — Progress Notes (Signed)
OFFICE PROGRESS NOTE  CC  [name], [name], MD [address] [place] [address] Dr. Emelia Loron Dr. 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 Originally diagnosed in September 2013.  PRIOR THERAPY:  #1Patient originally presented in September 2013 when she noticed dimpling within the left breast. She subsequently had a mammogram that showed suspicious findings. Further workup showed multiple areas of concern. There was noted to be a 2.8 cm spiculated mass within the upper outer quadrant of the left breast. The pathology did show invasive ductal carcinoma from the upper-outer quadrant of the left breast. There was also noted to be a 9 mm enhancing mass which was 2.2 cm anterior to the known area of malignancy. A third mass measuring 8 mm was 2.1 cm anterior to the known area of malignancy. Also a satellite spiculated mass was present 8 mm and a 4 mm rounded mass in the 12:00. She did have additional biopsies and was diagnosed With multicentric disease. There were also noted to be couple of small areas of concern in the right breast but these were negative for malignancy on biopsy.  #2 patient ultimately proceeded to undergo simple mastectomy on the left with sentinel lymph node biopsy on 03/21/2012. The final pathology within the left breast showed multifocal invasive and in situ ductal carcinoma. 4 distinct areas were seen measuring 3.2 cm, 2.3 cm, 1.3 cm, and 0.8 cm. All margins were negative. There was focal lymphatic vascular involvement and normal involvement by tumor. 2 sentinel nodes were positive for metastatic carcinoma with extracapsular extension. The tumors were ER positive PR positive and HER-2/neu positive.  #3 patient did undergo axillary lymph node dissection of the left axilla On 04/04/2012 additional 8 lymph nodes were removed. I have those 3 were positive therefore patient had 5/10 lymph nodes  positive for metastatic disease. With the final pathologic staging at T2 N2a.  #4 patient was begun on adjuvant systemic chemotherapy by Dr. Pierce Crane consisting of Taxotere carboplatinum given every 21 days with Herceptin given on oh weekly basis for the duration of Taxotere and carbo platinum. Her chemotherapy began On 05/03/2012. A total of 6 cycles of TCH combination is planned.  #5 after completion of chemotherapy she will begin radiation therapy and she has RE been seen by Dr. Dorothy Puffer.  She started radiation on 10/08/12.  #6 She began q 3 week herceptin on 10/11/12.  CURRENT THERAPY: Radiation therapy and Herceptin  INTERVAL HISTORY: Jena Tegeler 42 y.o. female returns for Followup visit. She hasn't been doing her physical therapy.  She is trying to figure out how to work it in with her new radiation schedule.  She started radiation on 10/08/12 and is so far tolerating it well.  She does have fatigue.  She saw Dr. Gala Romney on 4/4 along with having an echocardiogram and it showed an EF 50-55% and he cleared her to continue with the Herceptin q 3 weeks.  She is doing well today, she denies fevers, chills, nausea, vomiting, constipation, diarrhea, or any further concerns.    MEDICAL HISTORY: Past Medical History  Diagnosis Date  . Anemia   . Anxiety     does not like needles ..   . 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 Breast cancer 03/21/2012    left multifocal invasive, in situ ca,margins not involved,ER/PR=+,metastatic (1/1) node  . Allergy   . Complication of anesthesia  ALLERGIES:  is allergic to duricef.  MEDICATIONS:  Current Outpatient Prescriptions  Medication Sig Dispense Refill  . acetaminophen (TYLENOL) 325 MG tablet Take 650 mg by mouth every 6 (six) hours as needed.      . ALPRAZolam (XANAX) 0.25 MG tablet Take 1 tablet (0.25 mg total) by mouth at bedtime as needed for sleep.  10 tablet  0  . dexamethasone (DECADRON) 4 MG tablet       . ibuprofen (ADVIL,MOTRIN) 200  MG tablet Take 200 mg by mouth every 6 (six) hours as needed.      . lidocaine-prilocaine (EMLA) cream Apply topically as needed.  30 g  2  . LORazepam (ATIVAN) 1 MG tablet       . Multiple Vitamin (MULTIVITAMIN WITH MINERALS) TABS Take 1 tablet by mouth daily.      Marland Kitchen oxyCODONE-acetaminophen (ROXICET) 5-325 MG per tablet Take 1-2 tablets by mouth every 4 (four) hours as needed for pain.  30 tablet  0   No current facility-administered medications for this visit.   Facility-Administered Medications Ordered in Other Visits  Medication Dose Route Frequency Provider Last Rate Last Dose  . non-metallic deodorant (ALRA) 1 application  1 application Topical Once Oneita Hurt, MD        SURGICAL HISTORY:  Past Surgical History  Procedure Laterality Date  . Leg surgery      LLE  . Eye surgery      as a child  . Mastectomy complete / simple w/ sentinel node biopsy  03/21/2012    left Dr.Matthew Dwain Sarna  . Breast biopsy  89/07/2011    left breast uoq bx=invasive ductal ca  . Breast biopsy bi lateral  02/15/12    left = invasive ductal, right breast= fibroadenoma,fibrocystic changes,no atypia,hyperlasia or malignancy identified  . Abdominal hysterectomy  2006    w/o oophorectomy  . Axillary lymph node dissection  04/04/2012    Procedure: AXILLARY LYMPH NODE DISSECTION;  Surgeon: Emelia Loron, MD;  Location: [facility];  Service: General;  Laterality: Left;  Left Axillary lymph node dissection, Port a cath placement  . Portacath placement  04/04/2012    Procedure: INSERTION PORT-A-CATH;  Surgeon: Emelia Loron, MD;  Location: [facility];  Service: General;  Laterality: Right;    REVIEW OF SYSTEMS:   General: fatigue (+), night sweats (-), fever (-), pain (-) Lymph: palpable nodes (-) HEENT: vision changes (-), mucositis (-), gum bleeding (-), epistaxis (-) Cardiovascular: chest pain (-), palpitations (-) Pulmonary: shortness of breath (-), dyspnea on  exertion (-), cough (-), hemoptysis (-) GI:  Early satiety (-), melena (-), dysphagia (-), nausea/vomiting (-), diarrhea (-) GU: dysuria (-), hematuria (-), incontinence (-) Musculoskeletal: joint swelling (-), joint pain (-), back pain (-) Neuro: weakness (+), numbness (-), headache (-), confusion (-) Skin: Rash (-), lesions (-), dryness (-) Psych: depression (-), suicidal/homicidal ideation (-), feeling of hopelessness (-)    PHYSICAL EXAMINATION: There were no vitals taken for this visit. There is no weight on file to calculate BMI. General: Patient is a well appearing female in no acute distress HEENT: PERRLA, sclerae anicteric no conjunctival pallor, MMM Neck: supple, no palpable adenopathy Lungs: clear to auscultation bilaterally, no wheezes, rhonchi, or rales Cardiovascular: regular rate rhythm, S1, S2, no murmurs, rubs or gallops Abdomen: Soft, non-tender, non-distended, normoactive bowel sounds, no HSM Extremities: warm and well perfused, no clubbing, cyanosis, or edema Skin: No rashes or lesions Neuro: Non-focal Breasts: left breast mastectomy site well healed,  no nodularity, erythema or tenderness. Right breast no nodularity or masses. ECOG PERFORMANCE STATUS: 1 - Symptomatic but completely ambulatory   LABORATORY DATA: Lab Results  Component Value Date   WBC 6.1 10/11/2012   HGB 9.0* 10/11/2012   HCT 28.6* 10/11/2012   MCV 81.5 10/11/2012   PLT 244 10/11/2012      Chemistry      Component Value Date/Time   NA 144 09/20/2012 1243   NA 138 04/02/2012 1100   K 3.7 09/20/2012 1243   K 3.9 04/02/2012 1100   CL 111* 09/20/2012 1243   CL 103 04/02/2012 1100   CO2 24 09/20/2012 1243   CO2 25 04/02/2012 1100   BUN 17.9 09/20/2012 1243   BUN 16 04/02/2012 1100   CREATININE 0.7 09/20/2012 1243   CREATININE 0.49* 04/02/2012 1100      Component Value Date/Time   CALCIUM 8.6 09/20/2012 1243   CALCIUM 9.0 04/02/2012 1100   ALKPHOS 77 09/20/2012 1243   AST 24 09/20/2012 1243   ALT 40  09/20/2012 1243   BILITOT 0.75 09/20/2012 1243       RADIOGRAPHIC STUDIES:  No results found.  ASSESSMENT: 42 year old female with  #1 T2 N2 A. Invasive ductal carcinoma of the left breast that was multifocal she is status post mastectomy with axillary lymph node dissection in September October 2013. Patient's tumor was ER positive PR positive HER-2/neu positive.  #2 she is currently receiving adjuvant chemotherapy consisting of Taxotere carboplatinum and Herceptin total of 6 cycles of therapy is planned. She is here for cycle #4 [facility] today . Overall she is tolerating her treatment quite nicely.  #3 patient will need an echocardiogram-4/4 echo- EF 50-55%, Dr. Gala Romney cleared her to continue Herceptin.   #4 patient is complaining of increasing weakness in her lower extremities. She does have underlying cerebral palsy. And she feels since starting her chemotherapy she has had more weakness. I will refer her to physical therapy.   PLAN:   #1 Proceed with every three week Herceptin.  She was counseled on continue her exercises.    #2 She will continue daily radiation, we will see her back in 3 weeks for her next Herceptin treatment.    All questions were answered. The patient knows to call the clinic with any problems, questions or concerns. We can certainly see the patient much sooner if necessary.  I spent 25 minutes counseling the patient face to face. The total time spent in the appointment was 30 minutes.  Cherie Ouch Lyn Hollingshead, NP Medical Oncology [facility] Phone: [phone] 10/11/2012, 9:41 AM

## 2012-10-11 NOTE — Addendum Note (Signed)
Encounter addended by: Tessa Lerner, RN on: 10/11/2012  3:38 PM<BR>     Documentation filed: Orders

## 2012-10-11 NOTE — Patient Instructions (Addendum)
Doing well, proceed with Herceptin.  Please call us if you have any questions or concerns.

## 2012-10-11 NOTE — Progress Notes (Signed)
   Department of Radiation Oncology  Phone:  [phone] Fax:        [phone]  Weekly Treatment Note    Name: Alicia Pena Date: 10/11/2012 MRN: [id number] DOB: [birth year]   Current dose: 7.2 Gy  Current fraction: 4   MEDICATIONS: Current Outpatient Prescriptions  Medication Sig Dispense Refill  . acetaminophen (TYLENOL) 325 MG tablet Take 650 mg by mouth every 6 (six) hours as needed.      . ALPRAZolam (XANAX) 0.25 MG tablet Take 1 tablet (0.25 mg total) by mouth at bedtime as needed for sleep.  10 tablet  0  . ibuprofen (ADVIL,MOTRIN) 200 MG tablet Take 200 mg by mouth every 6 (six) hours as needed.      . lidocaine-prilocaine (EMLA) cream Apply topically as needed.  30 g  2  . LORazepam (ATIVAN) 1 MG tablet       . Multiple Vitamin (MULTIVITAMIN WITH MINERALS) TABS Take 1 tablet by mouth daily.      Marland Kitchen oxyCODONE-acetaminophen (ROXICET) 5-325 MG per tablet Take 1-2 tablets by mouth every 4 (four) hours as needed for pain.  30 tablet  0   Current Facility-Administered Medications  Medication Dose Route Frequency Provider Last Rate Last Dose  . non-metallic deodorant Thornton Papas) 1 application  1 application Topical Once Oneita Hurt, MD         ALLERGIES: Romualdo Bolk   LABORATORY DATA:  Lab Results  Component Value Date   WBC 6.1 10/11/2012   HGB 9.0* 10/11/2012   HCT 28.6* 10/11/2012   MCV 81.5 10/11/2012   PLT 244 10/11/2012   Lab Results  Component Value Date   NA 144 09/20/2012   K 3.7 09/20/2012   CL 111* 09/20/2012   CO2 24 09/20/2012   Lab Results  Component Value Date   ALT 40 09/20/2012   AST 24 09/20/2012   ALKPHOS 77 09/20/2012   BILITOT 0.75 09/20/2012     NARRATIVE: Alicia Pena was seen today for weekly treatment management. The chart was checked and the patient's films were reviewed. The patient has done very well with the beginning of her treatment. No difficulties.  PHYSICAL EXAMINATION: weight is 168 lb 4.8 oz (76.34 kg). Her oral temperature is  97.3 F (36.3 C). Her blood pressure is 112/85 and her pulse is 78. Her respiration is 20.        ASSESSMENT: The patient is doing satisfactorily with treatment.  PLAN: We will continue with the patient's radiation treatment as planned.

## 2012-10-11 NOTE — Telephone Encounter (Signed)
appts made and printed 

## 2012-10-11 NOTE — Patient Instructions (Addendum)
[facility] Discharge Instructions for Patients Receiving Chemotherapy  Today you received the following chemotherapy agents :Herceptin To help prevent nausea and vomiting after your treatment, we encourage you to take your nausea medication. If you develop nausea and vomiting that is not controlled by your nausea medication, call the clinic. If it is after clinic hours, call your family physician or the after hours number for the clinic or go to the Emergency Department.   BELOW ARE SYMPTOMS THAT SHOULD BE REPORTED IMMEDIATELY:  *FEVER GREATER THAN 100.5 F  *CHILLS WITH OR WITHOUT FEVER  NAUSEA AND VOMITING THAT IS NOT CONTROLLED WITH YOUR NAUSEA MEDICATION  *UNUSUAL SHORTNESS OF BREATH  *UNUSUAL BRUISING OR BLEEDING  TENDERNESS IN MOUTH AND THROAT WITH OR WITHOUT PRESENCE OF ULCERS  *URINARY PROBLEMS  *BOWEL PROBLEMS  UNUSUAL RASH Items with * indicate a potential emergency and should be followed up as soon as possible.  The clinic phone number is [phone].    

## 2012-10-11 NOTE — Progress Notes (Signed)
patint in w/c alert,oriented x3, weekly rad tx left breast/chest area, 4 completed so far, radiation therapy and you book, fler sheet on skin products and use of products, radiaplex gel given, doesn't use deodorant, discussed s/s, s/e to report, no c/o pain, no skin changes noted, diet discussed,fatigue,skin issue Teach back given 8:54 AM

## 2012-10-11 NOTE — Telephone Encounter (Signed)
Per staff phone call and POF I have schedueld appts.  JMW  

## 2012-10-14 ENCOUNTER — Ambulatory Visit
Admission: RE | Admit: 2012-10-14 | Discharge: 2012-10-14 | Disposition: A | Discharge status: Home / Self Care | Payer: PRIVATE HEALTH INSURANCE | Source: Ambulatory Visit | Attending: Radiation Oncology | Admitting: Radiation Oncology

## 2012-10-15 ENCOUNTER — Ambulatory Visit
Admission: RE | Admit: 2012-10-15 | Discharge: 2012-10-15 | Disposition: A | Discharge status: Home / Self Care | Payer: PRIVATE HEALTH INSURANCE | Source: Ambulatory Visit | Attending: Radiation Oncology | Admitting: Radiation Oncology

## 2012-10-16 ENCOUNTER — Ambulatory Visit
Admission: RE | Admit: 2012-10-16 | Discharge: 2012-10-16 | Disposition: A | Discharge status: Home / Self Care | Payer: PRIVATE HEALTH INSURANCE | Source: Ambulatory Visit | Attending: Radiation Oncology | Admitting: Radiation Oncology

## 2012-10-17 ENCOUNTER — Ambulatory Visit
Admission: RE | Admit: 2012-10-17 | Discharge: 2012-10-17 | Disposition: A | Discharge status: Home / Self Care | Payer: PRIVATE HEALTH INSURANCE | Source: Ambulatory Visit | Attending: Radiation Oncology | Admitting: Radiation Oncology

## 2012-10-18 ENCOUNTER — Ambulatory Visit
Admission: RE | Admit: 2012-10-18 | Discharge: 2012-10-18 | Disposition: A | Discharge status: Home / Self Care | Payer: PRIVATE HEALTH INSURANCE | Source: Ambulatory Visit | Attending: Radiation Oncology | Admitting: Radiation Oncology

## 2012-10-18 VITALS — BP 130/87 | HR 83 | Temp 98.0°F | Wt 171.4 lb

## 2012-10-18 DIAGNOSIS — C50412 Malignant neoplasm of upper-outer quadrant of left female breast: Secondary | ICD-10-CM

## 2012-10-18 NOTE — Progress Notes (Signed)
Patient here for weekly assessment of left chest wall radiation.No skin changes.Denies pain.Generalized fatigue.

## 2012-10-18 NOTE — Progress Notes (Signed)
   Department of Radiation Oncology  Phone:  [phone] Fax:        [phone]  Weekly Treatment Note    Name: Alicia Pena Date: 10/18/2012 MRN: [id number] DOB: 08-Mar-1971   Current dose: 16.2 Gy  Current fraction: 9   MEDICATIONS: Current Outpatient Prescriptions  Medication Sig Dispense Refill  . acetaminophen (TYLENOL) 325 MG tablet Take 650 mg by mouth every 6 (six) hours as needed.      . ALPRAZolam (XANAX) 0.25 MG tablet Take 1 tablet (0.25 mg total) by mouth at bedtime as needed for sleep.  10 tablet  0  . dexamethasone (DECADRON) 4 MG tablet       . ibuprofen (ADVIL,MOTRIN) 200 MG tablet Take 200 mg by mouth every 6 (six) hours as needed.      . lidocaine-prilocaine (EMLA) cream Apply topically as needed.  30 g  2  . LORazepam (ATIVAN) 1 MG tablet       . Multiple Vitamin (MULTIVITAMIN WITH MINERALS) TABS Take 1 tablet by mouth daily.      Marland Kitchen oxyCODONE-acetaminophen (ROXICET) 5-325 MG per tablet Take 1-2 tablets by mouth every 4 (four) hours as needed for pain.  30 tablet  0   No current facility-administered medications for this encounter.     ALLERGIES: Duricef   LABORATORY DATA:  Lab Results  Component Value Date   WBC 6.1 10/11/2012   HGB 9.0* 10/11/2012   HCT 28.6* 10/11/2012   MCV 81.5 10/11/2012   PLT 244 10/11/2012   Lab Results  Component Value Date   NA 144 09/20/2012   K 3.7 09/20/2012   CL 111* 09/20/2012   CO2 24 09/20/2012   Lab Results  Component Value Date   ALT 40 09/20/2012   AST 24 09/20/2012   ALKPHOS 77 09/20/2012   BILITOT 0.75 09/20/2012     NARRATIVE: Alicia Pena was seen today for weekly treatment management. The chart was checked and the patient's films were reviewed. The patient states that she is doing very well. No complaints. She has not noticed any skin changes.  PHYSICAL EXAMINATION: weight is 171 lb 6.4 oz (77.747 kg). Her temperature is 98 F (36.7 C). Her blood pressure is 130/87 and her pulse is 83.      minimal skin  changes in the treatment area.  ASSESSMENT: The patient is doing satisfactorily with treatment.  PLAN: We will continue with the patient's radiation treatment as planned.

## 2012-10-21 ENCOUNTER — Encounter: Payer: Self-pay | Admitting: Oncology

## 2012-10-21 ENCOUNTER — Encounter: Payer: Self-pay | Admitting: Adult Health

## 2012-10-21 ENCOUNTER — Ambulatory Visit
Admission: RE | Admit: 2012-10-21 | Discharge: 2012-10-21 | Disposition: A | Discharge status: Home / Self Care | Payer: PRIVATE HEALTH INSURANCE | Source: Ambulatory Visit | Attending: Radiation Oncology | Admitting: Radiation Oncology

## 2012-10-22 ENCOUNTER — Ambulatory Visit
Admission: RE | Admit: 2012-10-22 | Discharge: 2012-10-22 | Disposition: A | Discharge status: Home / Self Care | Payer: PRIVATE HEALTH INSURANCE | Source: Ambulatory Visit | Attending: Radiation Oncology | Admitting: Radiation Oncology

## 2012-10-23 ENCOUNTER — Ambulatory Visit
Admission: RE | Admit: 2012-10-23 | Discharge: 2012-10-23 | Disposition: A | Discharge status: Home / Self Care | Payer: PRIVATE HEALTH INSURANCE | Source: Ambulatory Visit | Attending: Radiation Oncology | Admitting: Radiation Oncology

## 2012-10-24 ENCOUNTER — Ambulatory Visit
Admission: RE | Admit: 2012-10-24 | Discharge: 2012-10-24 | Disposition: A | Discharge status: Home / Self Care | Payer: PRIVATE HEALTH INSURANCE | Source: Ambulatory Visit | Attending: Radiation Oncology | Admitting: Radiation Oncology

## 2012-10-25 ENCOUNTER — Ambulatory Visit
Admission: RE | Admit: 2012-10-25 | Discharge: 2012-10-25 | Disposition: A | Discharge status: Home / Self Care | Payer: PRIVATE HEALTH INSURANCE | Source: Ambulatory Visit | Attending: Radiation Oncology | Admitting: Radiation Oncology

## 2012-10-25 VITALS — BP 129/83 | HR 90 | Temp 98.0°F | Wt 169.2 lb

## 2012-10-25 DIAGNOSIS — C50412 Malignant neoplasm of upper-outer quadrant of left female breast: Secondary | ICD-10-CM

## 2012-10-25 NOTE — Progress Notes (Signed)
   Department of Radiation Oncology  Phone:  [phone] Fax:        [phone]  Weekly Treatment Note    Name: Alicia Pena Date: 10/25/2012 MRN: [id number] DOB: 27-Nov-1970   Current dose: 25.2 Gy  Current fraction: 14   MEDICATIONS: Current Outpatient Prescriptions  Medication Sig Dispense Refill  . acetaminophen (TYLENOL) 325 MG tablet Take 650 mg by mouth every 6 (six) hours as needed.      . ALPRAZolam (XANAX) 0.25 MG tablet Take 1 tablet (0.25 mg total) by mouth at bedtime as needed for sleep.  10 tablet  0  . dexamethasone (DECADRON) 4 MG tablet       . ibuprofen (ADVIL,MOTRIN) 200 MG tablet Take 200 mg by mouth every 6 (six) hours as needed.      . lidocaine-prilocaine (EMLA) cream Apply topically as needed.  30 g  2  . LORazepam (ATIVAN) 1 MG tablet       . Multiple Vitamin (MULTIVITAMIN WITH MINERALS) TABS Take 1 tablet by mouth daily.      Marland Kitchen oxyCODONE-acetaminophen (ROXICET) 5-325 MG per tablet Take 1-2 tablets by mouth every 4 (four) hours as needed for pain.  30 tablet  0   No current facility-administered medications for this encounter.     ALLERGIES: Duricef   LABORATORY DATA:  Lab Results  Component Value Date   WBC 6.1 10/11/2012   HGB 9.0* 10/11/2012   HCT 28.6* 10/11/2012   MCV 81.5 10/11/2012   PLT 244 10/11/2012   Lab Results  Component Value Date   NA 144 09/20/2012   K 3.7 09/20/2012   CL 111* 09/20/2012   CO2 24 09/20/2012   Lab Results  Component Value Date   ALT 40 09/20/2012   AST 24 09/20/2012   ALKPHOS 77 09/20/2012   BILITOT 0.75 09/20/2012     NARRATIVE: Alicia Pena was seen today for weekly treatment management. The chart was checked and the patient's films were reviewed. The patient is doing very well. She feels a little bit tired. No skin problems so far.  PHYSICAL EXAMINATION: weight is 169 lb 3.2 oz (76.749 kg). Her temperature is 98 F (36.7 C). Her blood pressure is 129/83 and her pulse is 90. Her oxygen saturation is 100%.       mild hyperpigmentation emerging the treatment area. No desquamation.  ASSESSMENT: The patient is doing satisfactorily with treatment.  PLAN: We will continue with the patient's radiation treatment as planned.

## 2012-10-25 NOTE — Progress Notes (Signed)
Patient for weekly assessment of left chestwall radiation.Skin changes very mild tanning.Continue application of radiaplex.Generalized fatigue.

## 2012-10-28 ENCOUNTER — Ambulatory Visit
Admission: RE | Admit: 2012-10-28 | Discharge: 2012-10-28 | Disposition: A | Discharge status: Home / Self Care | Payer: PRIVATE HEALTH INSURANCE | Source: Ambulatory Visit | Attending: Radiation Oncology | Admitting: Radiation Oncology

## 2012-10-28 DIAGNOSIS — Z510 Encounter for antineoplastic radiation therapy: Secondary | ICD-10-CM | POA: Insufficient documentation

## 2012-10-28 DIAGNOSIS — C50919 Malignant neoplasm of unspecified site of unspecified female breast: Secondary | ICD-10-CM | POA: Insufficient documentation

## 2012-10-28 DIAGNOSIS — L989 Disorder of the skin and subcutaneous tissue, unspecified: Secondary | ICD-10-CM | POA: Insufficient documentation

## 2012-10-28 DIAGNOSIS — Z79899 Other long term (current) drug therapy: Secondary | ICD-10-CM | POA: Insufficient documentation

## 2012-10-28 DIAGNOSIS — Y842 Radiological procedure and radiotherapy as the cause of abnormal reaction of the patient, or of later complication, without mention of misadventure at the time of the procedure: Secondary | ICD-10-CM | POA: Insufficient documentation

## 2012-10-29 ENCOUNTER — Ambulatory Visit
Admission: RE | Admit: 2012-10-29 | Discharge: 2012-10-29 | Disposition: A | Discharge status: Home / Self Care | Payer: PRIVATE HEALTH INSURANCE | Source: Ambulatory Visit | Attending: Radiation Oncology | Admitting: Radiation Oncology

## 2012-10-30 ENCOUNTER — Ambulatory Visit
Admission: RE | Admit: 2012-10-30 | Discharge: 2012-10-30 | Disposition: A | Discharge status: Home / Self Care | Payer: PRIVATE HEALTH INSURANCE | Source: Ambulatory Visit | Attending: Radiation Oncology | Admitting: Radiation Oncology

## 2012-10-31 ENCOUNTER — Ambulatory Visit
Admission: RE | Admit: 2012-10-31 | Discharge: 2012-10-31 | Disposition: A | Discharge status: Home / Self Care | Payer: PRIVATE HEALTH INSURANCE | Source: Ambulatory Visit | Attending: Radiation Oncology | Admitting: Radiation Oncology

## 2012-11-01 ENCOUNTER — Ambulatory Visit (HOSPITAL_BASED_OUTPATIENT_CLINIC_OR_DEPARTMENT_OTHER): Payer: PRIVATE HEALTH INSURANCE

## 2012-11-01 ENCOUNTER — Encounter: Payer: Self-pay | Admitting: Radiation Oncology

## 2012-11-01 ENCOUNTER — Encounter: Payer: Self-pay | Admitting: Adult Health

## 2012-11-01 ENCOUNTER — Ambulatory Visit
Admission: RE | Admit: 2012-11-01 | Discharge: 2012-11-01 | Disposition: A | Discharge status: Home / Self Care | Payer: PRIVATE HEALTH INSURANCE | Source: Ambulatory Visit | Attending: Radiation Oncology | Admitting: Radiation Oncology

## 2012-11-01 ENCOUNTER — Ambulatory Visit (HOSPITAL_BASED_OUTPATIENT_CLINIC_OR_DEPARTMENT_OTHER): Payer: PRIVATE HEALTH INSURANCE | Admitting: Adult Health

## 2012-11-01 ENCOUNTER — Other Ambulatory Visit (HOSPITAL_BASED_OUTPATIENT_CLINIC_OR_DEPARTMENT_OTHER): Payer: PRIVATE HEALTH INSURANCE

## 2012-11-01 VITALS — Temp 98.4°F

## 2012-11-01 VITALS — BP 114/79 | HR 76 | Resp 16 | Wt 167.4 lb

## 2012-11-01 DIAGNOSIS — C50912 Malignant neoplasm of unspecified site of left female breast: Secondary | ICD-10-CM

## 2012-11-01 DIAGNOSIS — C50412 Malignant neoplasm of upper-outer quadrant of left female breast: Secondary | ICD-10-CM

## 2012-11-01 DIAGNOSIS — C50419 Malignant neoplasm of upper-outer quadrant of unspecified female breast: Secondary | ICD-10-CM

## 2012-11-01 DIAGNOSIS — F411 Generalized anxiety disorder: Secondary | ICD-10-CM

## 2012-11-01 DIAGNOSIS — Z170 Estrogen receptor positive status [ER+]: Secondary | ICD-10-CM

## 2012-11-01 DIAGNOSIS — F419 Anxiety disorder, unspecified: Secondary | ICD-10-CM

## 2012-11-01 DIAGNOSIS — Z5112 Encounter for antineoplastic immunotherapy: Secondary | ICD-10-CM

## 2012-11-01 DIAGNOSIS — C50919 Malignant neoplasm of unspecified site of unspecified female breast: Secondary | ICD-10-CM

## 2012-11-01 LAB — COMPREHENSIVE METABOLIC PANEL (CC13)
ALT: 24 U/L (ref 0–55)
AST: 17 U/L (ref 5–34)
Albumin: 3.2 g/dL — ABNORMAL LOW (ref 3.5–5.0)
Calcium: 8.7 mg/dL (ref 8.4–10.4)
Chloride: 109 mEq/L — ABNORMAL HIGH (ref 98–107)
Potassium: 3.3 mEq/L — ABNORMAL LOW (ref 3.5–5.1)

## 2012-11-01 LAB — CBC WITH DIFFERENTIAL/PLATELET
BASO%: 0.3 % (ref 0.0–2.0)
HCT: 31.3 % — ABNORMAL LOW (ref 34.8–46.6)
MCHC: 31.3 g/dL — ABNORMAL LOW (ref 31.5–36.0)
MONO#: 0.4 10*3/uL (ref 0.1–0.9)
NEUT#: 4.4 10*3/uL (ref 1.5–6.5)
NEUT%: 73.1 % (ref 38.4–76.8)
RBC: 3.98 10*6/uL (ref 3.70–5.45)
WBC: 6.1 10*3/uL (ref 3.9–10.3)
lymph#: 0.9 10*3/uL (ref 0.9–3.3)
nRBC: 0 % (ref 0–0)

## 2012-11-01 MED ORDER — ACETAMINOPHEN 325 MG PO TABS
650.0000 mg | ORAL_TABLET | Freq: Once | ORAL | Status: AC
  Administered 2012-11-01: 650 mg via ORAL

## 2012-11-01 MED ORDER — LIDOCAINE-PRILOCAINE 2.5-2.5 % EX CREA: TOPICAL_CREAM | CUTANEOUS | Status: DC | PRN

## 2012-11-01 MED ORDER — TRASTUZUMAB CHEMO INJECTION 440 MG
6.0000 mg/kg | Freq: Once | INTRAVENOUS | Status: AC
  Administered 2012-11-01: 462 mg via INTRAVENOUS
  Filled 2012-11-01: qty 22

## 2012-11-01 MED ORDER — DIPHENHYDRAMINE HCL 25 MG PO CAPS
50.0000 mg | ORAL_CAPSULE | Freq: Once | ORAL | Status: AC
  Administered 2012-11-01: 50 mg via ORAL

## 2012-11-01 MED ORDER — SODIUM CHLORIDE 0.9 % IJ SOLN
10.0000 mL | INTRAMUSCULAR | Status: DC | PRN
  Administered 2012-11-01: 10 mL
  Filled 2012-11-01: qty 10

## 2012-11-01 MED ORDER — SODIUM CHLORIDE 0.9 % IV SOLN
Freq: Once | INTRAVENOUS | Status: AC
  Administered 2012-11-01: 15:00:00 via INTRAVENOUS

## 2012-11-01 MED ORDER — HEPARIN SOD (PORK) LOCK FLUSH 100 UNIT/ML IV SOLN
500.0000 [IU] | Freq: Once | INTRAVENOUS | Status: AC | PRN
  Administered 2012-11-01: 500 [IU]
  Filled 2012-11-01: qty 5

## 2012-11-01 MED ORDER — VENLAFAXINE HCL ER 37.5 MG PO CP24: 37.5000 mg | ORAL_CAPSULE | Freq: Every day | ORAL | Status: DC

## 2012-11-01 NOTE — Progress Notes (Signed)
Patient presents to the clinic today unaccompanied for PUT with Dr. Mitzi Hansen. Patient is alert and oriented to person, place, and time. No distress noted. Patient being pushed in wheelchair. Patient denies pain at this time. Only faint hyperpigmentation of left lower chest wall without desquamation noted. Patient reports using radiaplex once per day. Patient reports mild fatigue. Reported all findings to Dr. Mitzi Hansen.

## 2012-11-01 NOTE — Patient Instructions (Signed)

## 2012-11-01 NOTE — Patient Instructions (Signed)
Venlafaxine extended-release capsules What is this medicine? VENLAFAXINE (VEN la fax een) is used to treat depression, anxiety and panic disorder. This medicine may be used for other purposes; ask your health care provider or pharmacist if you have questions. What should I tell my health care provider before I take this medicine? They need to know if you have any of these conditions: -anorexia or weight loss -glaucoma -high blood pressure, heart problems or a recent heart attack -high cholesterol levels or receiving treatment for high cholesterol -kidney or liver disease -mania or bipolar disorder -seizures (convulsions) -suicidal thoughts or a previous suicide attempt -thyroid problems -an unusual or allergic reaction to venlafaxine, other medicines, foods, dyes, or preservatives -pregnant or trying to get pregnant -breast-feeding How should I use this medicine? Take this medicine by mouth with a full glass of water. Follow the directions on the prescription label. Do not cut, crush or chew this medicine. Take it with food. Try to take your medicine at about the same time each day. Do not take your medicine more often than directed. Do not stop taking this medicine suddenly except upon the advice of your doctor. Stopping this medicine too quickly may cause serious side effects or your condition may worsen. A special MedGuide will be given to you by the pharmacist with each prescription and refill. Be sure to read this information carefully each time. Talk to your pediatrician regarding the use of this medicine in children. Special care may be needed. Overdosage: If you think you have taken too much of this medicine contact a poison control center or emergency room at once. NOTE: This medicine is only for you. Do not share this medicine with others. What if I miss a dose? If you miss a dose, take it as soon as you can. If it is almost time for your next dose, take only that dose. Do not take  double or extra doses. What may interact with this medicine? Do not take this medicine with any of the following medications: -desvenlafaxine -duloxetine -linezolid -MAOIs like Azilect, Carbex, Eldepryl, Marplan, Nardil, and Parnate -medicines for weight control or appetite -methylene blue (injected into a vein) -nefazodone -procarbazine -tryptophan This medicine may also interact with the following medications: -amphetamine or dextroamphetamine -aspirin and aspirin-like medicines -cimetidine -clozapine -medicines for heart rhythm or blood pressure -medicines for migraine headache like almotriptan, eletriptan, frovatriptan, naratriptan, rizatriptan, sumatriptan, zolmitriptan -medicines that treat or prevent blood clots like warfarin, enoxaparin, and dalteparin -NSAIDS, medicines for pain and inflammation, like ibuprofen or naproxen -other medicines for depression, anxiety, or psychotic disturbances -ritonavir -St. John's wort -tramadol This list may not describe all possible interactions. Give your health care provider a list of all the medicines, herbs, non-prescription drugs, or dietary supplements you use. Also tell them if you smoke, drink alcohol, or use illegal drugs. Some items may interact with your medicine. What should I watch for while using this medicine? Tell your doctor if your symptoms do not get better or if they get worse. Visit your doctor or health care professional for regular checks on your progress. Because it may take several weeks to see the full effects of this medicine, it is important to continue your treatment as prescribed by your doctor. Patients and their families should watch out for new or worsening thoughts of suicide or depression. Also watch out for sudden changes in feelings such as feeling anxious, agitated, panicky, irritable, hostile, aggressive, impulsive, severely restless, overly excited and hyperactive, or not being able to  sleep. If this  happens, especially at the beginning of treatment or after a change in dose, call your health care professional. This medicine can cause an increase in blood pressure. Check with your doctor for instructions on monitoring your blood pressure while taking this medicine. You may get drowsy or dizzy. Do not drive, use machinery, or do anything that needs mental alertness until you know how this medicine affects you. Do not stand or sit up quickly, especially if you are an older patient. This reduces the risk of dizzy or fainting spells. Alcohol may interfere with the effect of this medicine. Avoid alcoholic drinks. Your mouth may get dry. Chewing sugarless gum, sucking hard candy and drinking plenty of water will help. Contact your doctor if the problem does not go away or is severe. What side effects may I notice from receiving this medicine? Side effects that you should report to your doctor or health care professional as soon as possible: -allergic reactions like skin rash, itching or hives, swelling of the face, lips, or tongue -breathing problems -changes in vision -hallucination, loss of contact with reality -seizures -suicidal thoughts or other mood changes -trouble passing urine or change in the amount of urine -unusual bleeding or bruising Side effects that usually do not require medical attention (report to your doctor or health care professional if they continue or are bothersome): -change in sex drive or performance -constipation -increased sweating -loss of appetite -nausea -tremors -weight loss This list may not describe all possible side effects. Call your doctor for medical advice about side effects. You may report side effects to FDA at 1-800-FDA-1088. Where should I keep my medicine? Keep out of the reach of children. Store at a controlled temperature between 20 and 25 degrees C (68 degrees and 77 degrees F), in a dry place. Throw away any unused medicine after the expiration  date. NOTE: This sheet is a summary. It may not cover all possible information. If you have questions about this medicine, talk to your doctor, pharmacist, or health care provider.  2013, [employer]. (11/03/2011 9:05:04 PM)

## 2012-11-01 NOTE — Progress Notes (Signed)
OFFICE PROGRESS NOTE  CC  Alicia Pena, [name], MD [address] [place] [address] Dr. Emelia Loron Dr. 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 Originally diagnosed in September 2013.  PRIOR THERAPY:  #1Patient originally presented in September 2013 when she noticed dimpling within the left breast. She subsequently had a mammogram that showed suspicious findings. Further workup showed multiple areas of concern. There was noted to be a 2.8 cm spiculated mass within the upper outer quadrant of the left breast. The pathology did show invasive ductal carcinoma from the upper-outer quadrant of the left breast. There was also noted to be a 9 mm enhancing mass which was 2.2 cm anterior to the known area of malignancy. A third mass measuring 8 mm was 2.1 cm anterior to the known area of malignancy. Also a satellite spiculated mass was present 8 mm and a 4 mm rounded mass in the 12:00. She did have additional biopsies and was diagnosed With multicentric disease. There were also noted to be couple of small areas of concern in the right breast but these were negative for malignancy on biopsy.  #2 patient ultimately proceeded to undergo simple mastectomy on the left with sentinel lymph node biopsy on 03/21/2012. The final pathology within the left breast showed multifocal invasive and in situ ductal carcinoma. 4 distinct areas were seen measuring 3.2 cm, 2.3 cm, 1.3 cm, and 0.8 cm. All margins were negative. There was focal lymphatic vascular involvement and normal involvement by tumor. 2 sentinel nodes were positive for metastatic carcinoma with extracapsular extension. The tumors were ER positive PR positive and HER-2/neu positive.  #3 patient did undergo axillary lymph node dissection of the left axilla On 04/04/2012 additional 8 lymph nodes were removed. I have those 3 were positive therefore patient had 5/10 lymph nodes  positive for metastatic disease. With the final pathologic staging at T2 N2a.  #4 patient was begun on adjuvant systemic chemotherapy by Dr. Pierce Crane consisting of Taxotere carboplatinum given every 21 days with Herceptin given on oh weekly basis for the duration of Taxotere and carbo platinum. Her chemotherapy began On 05/03/2012. She received a total of 6 cycles of this.  #5 after completion of chemotherapy she will begin radiation therapy and she has RE been seen by Dr. Dorothy Puffer.  She started radiation on 10/08/12.  #6 She began q 3 week herceptin on 10/11/12.  CURRENT THERAPY: Radiation therapy and Herceptin  INTERVAL HISTORY: Alicia Pena 42 y.o. female returns for Followup visit prior to her next herceptin dose.  She is feeling well today.  Her only problem is fatigue and moodiness at work.  Shes anxious about it.  She is very tearful when talking about it.  She also wants to know about the next steps after she's completed radiation therapy.  She's tolerating radiation well with the exception of fatigue and mild redness on her chest wall.  Otherwise, a 10 point ros is neg.   MEDICAL HISTORY: Past Medical History  Diagnosis Date  . Anemia   . Anxiety     does not like needles ..   . 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 Breast cancer 03/21/2012    left multifocal invasive, in situ ca,margins not involved,ER/PR=+,metastatic (1/1) node  . Allergy   . Complication of anesthesia     ALLERGIES:  is allergic to duricef.  MEDICATIONS:  Current Outpatient Prescriptions  Medication Sig Dispense  Refill  . acetaminophen (TYLENOL) 325 MG tablet Take 650 mg by mouth every 6 (six) hours as needed.      . ALPRAZolam (XANAX) 0.25 MG tablet Take 1 tablet (0.25 mg total) by mouth at bedtime as needed for sleep.  10 tablet  0  . ibuprofen (ADVIL,MOTRIN) 200 MG tablet Take 200 mg by mouth every 6 (six) hours as needed.      . lidocaine-prilocaine (EMLA) cream Apply topically as needed.  30 g  2  .  Multiple Vitamin (MULTIVITAMIN WITH MINERALS) TABS Take 1 tablet by mouth daily.      Marland Kitchen LORazepam (ATIVAN) 1 MG tablet       . venlafaxine XR (EFFEXOR-XR) 37.5 MG 24 hr capsule Take 1 capsule (37.5 mg total) by mouth daily.  30 capsule  0   No current facility-administered medications for this visit.   Facility-Administered Medications Ordered in Other Visits  Medication Dose Route Frequency Provider Last Rate Last Dose  . sodium chloride 0.9 % injection 10 mL  10 mL Intracatheter PRN Augustin Schooling, NP   10 mL at 11/01/12 1554    SURGICAL HISTORY:  Past Surgical History  Procedure Laterality Date  . Leg surgery      LLE  . Eye surgery      as a child  . Mastectomy complete / simple w/ sentinel node biopsy  03/21/2012    left Dr.Matthew Dwain Sarna  . Breast biopsy  89/07/2011    left breast uoq bx=invasive ductal ca  . Breast biopsy bi lateral  02/15/12    left = invasive ductal, right breast= fibroadenoma,fibrocystic changes,no atypia,hyperlasia or malignancy identified  . Abdominal hysterectomy  2006    w/o oophorectomy  . Axillary lymph node dissection  04/04/2012    Procedure: AXILLARY LYMPH NODE DISSECTION;  Surgeon: Emelia Loron, MD;  Location: [facility];  Service: General;  Laterality: Left;  Left Axillary lymph node dissection, Port a cath placement  . Portacath placement  04/04/2012    Procedure: INSERTION PORT-A-CATH;  Surgeon: Emelia Loron, MD;  Location: [facility];  Service: General;  Laterality: Right;    REVIEW OF SYSTEMS:   General: fatigue (+), night sweats (-), fever (-), pain (-) Lymph: palpable nodes (-) HEENT: vision changes (-), mucositis (-), gum bleeding (-), epistaxis (-) Cardiovascular: chest pain (-), palpitations (-) Pulmonary: shortness of breath (-), dyspnea on exertion (-), cough (-), hemoptysis (-) GI:  Early satiety (-), melena (-), dysphagia (-), nausea/vomiting (-), diarrhea (-) GU: dysuria (-), hematuria  (-), incontinence (-) Musculoskeletal: joint swelling (-), joint pain (-), back pain (-) Neuro: weakness (+), numbness (-), headache (-), confusion (-) Skin: Rash (-), lesions (-), dryness (-) Psych: depression (-), suicidal/homicidal ideation (-), feeling of hopelessness (-)    PHYSICAL EXAMINATION: Temperature 98.4 F (36.9 C), temperature source Oral. There is no weight on file to calculate BMI. General: Patient is a well appearing female in no acute distress HEENT: PERRLA, sclerae anicteric no conjunctival pallor, MMM Neck: supple, no palpable adenopathy Lungs: clear to auscultation bilaterally, no wheezes, rhonchi, or rales Cardiovascular: regular rate rhythm, S1, S2, no murmurs, rubs or gallops Abdomen: Soft, non-tender, non-distended, normoactive bowel sounds, no HSM Extremities: warm and well perfused, no clubbing, cyanosis, or edema Skin: No rashes or lesions Neuro: Non-focal Breasts: left breast mastectomy site well healed, no nodularity, erythema to the right chest wall, nontender Right breast no nodularity or masses. ECOG PERFORMANCE STATUS: 1 - Symptomatic but completely ambulatory  LABORATORY DATA: Lab Results  Component Value Date   WBC 6.1 11/01/2012   HGB 9.8* 11/01/2012   HCT 31.3* 11/01/2012   MCV 78.6* 11/01/2012   PLT 226 11/01/2012      Chemistry      Component Value Date/Time   NA 143 11/01/2012 1315   NA 138 04/02/2012 1100   K 3.3* 11/01/2012 1315   K 3.9 04/02/2012 1100   CL 109* 11/01/2012 1315   CL 103 04/02/2012 1100   CO2 26 11/01/2012 1315   CO2 25 04/02/2012 1100   BUN 16.3 11/01/2012 1315   BUN 16 04/02/2012 1100   CREATININE 0.7 11/01/2012 1315   CREATININE 0.49* 04/02/2012 1100      Component Value Date/Time   CALCIUM 8.7 11/01/2012 1315   CALCIUM 9.0 04/02/2012 1100   ALKPHOS 75 11/01/2012 1315   AST 17 11/01/2012 1315   ALT 24 11/01/2012 1315   BILITOT 0.71 11/01/2012 1315       RADIOGRAPHIC STUDIES:  No results found.  ASSESSMENT: 42 year old female  with  #1 T2 N2 A. Invasive ductal carcinoma of the left breast that was multifocal she is status post mastectomy with axillary lymph node dissection in September October 2013. Patient's tumor was ER positive PR positive HER-2/neu positive.  #2 she is currently receiving adjuvant chemotherapy consisting of Taxotere carboplatinum and Herceptin total of 6 cycles of therapy is planned. She is here for cycle #4 Of [facility] today . Overall she is tolerating her treatment quite nicely.  #3 patient will need an echocardiogram-4/4 echo- EF 50-55%, Dr. Gala Romney cleared her to continue Herceptin.   #4 patient is complaining of increasing weakness in her lower extremities. She does have underlying cerebral palsy. And she feels since starting her chemotherapy she has had more weakness. I will refer her to physical therapy.   PLAN:   #1 Proceed with every three week Herceptin.  Her last echo was 4/4 and she saw Dr. Gala Romney who cleared her to continue radiation.    #2 She will continue daily radiation, we will see her back in 3 weeks for her next Herceptin treatment.    #3 I prescribed Effexor XR for the moodiness and anxiety.  She will start tamoxifen soon and this will also help with hot flashes should she develop those.  I gave her the details of the medication in her after visit summary.   All questions were answered. The patient knows to call the clinic with any problems, questions or concerns. We can certainly see the patient much sooner if necessary.  I spent 25 minutes counseling the patient face to face. The total time spent in the appointment was 30 minutes.  Cherie Ouch Lyn Hollingshead, NP Medical Oncology [facility] Phone: [phone] 11/01/2012, 5:41 PM

## 2012-11-01 NOTE — Progress Notes (Signed)
   Department of Radiation Oncology  Phone:  [phone] Fax:        [phone]  Weekly Treatment Note    Name: Alicia Pena Date: 11/01/2012 MRN: [id number] DOB: 1971-02-07   Current dose: 34 point Gy  Current fraction: 19   MEDICATIONS: Current Outpatient Prescriptions  Medication Sig Dispense Refill  . acetaminophen (TYLENOL) 325 MG tablet Take 650 mg by mouth every 6 (six) hours as needed.      . ALPRAZolam (XANAX) 0.25 MG tablet Take 1 tablet (0.25 mg total) by mouth at bedtime as needed for sleep.  10 tablet  0  . ibuprofen (ADVIL,MOTRIN) 200 MG tablet Take 200 mg by mouth every 6 (six) hours as needed.      . lidocaine-prilocaine (EMLA) cream Apply topically as needed.  30 g  2  . LORazepam (ATIVAN) 1 MG tablet       . Multiple Vitamin (MULTIVITAMIN WITH MINERALS) TABS Take 1 tablet by mouth daily.      Marland Kitchen dexamethasone (DECADRON) 4 MG tablet       . oxyCODONE-acetaminophen (ROXICET) 5-325 MG per tablet Take 1-2 tablets by mouth every 4 (four) hours as needed for pain.  30 tablet  0   No current facility-administered medications for this encounter.     ALLERGIES: Duricef   LABORATORY DATA:  Lab Results  Component Value Date   WBC 6.1 10/11/2012   HGB 9.0* 10/11/2012   HCT 28.6* 10/11/2012   MCV 81.5 10/11/2012   PLT 244 10/11/2012   Lab Results  Component Value Date   NA 144 09/20/2012   K 3.7 09/20/2012   CL 111* 09/20/2012   CO2 24 09/20/2012   Lab Results  Component Value Date   ALT 40 09/20/2012   AST 24 09/20/2012   ALKPHOS 77 09/20/2012   BILITOT 0.75 09/20/2012     NARRATIVE: Alicia Pena was seen today for weekly treatment management. The chart was checked and the patient's films were reviewed.  The patient states that she is doing well overall. She is using skin cream daily. Fatigue is her major complaint at this point. She feels that she may need to shorten her work hours.  PHYSICAL EXAMINATION: weight is 167 lb 6.4 oz (75.932 kg). Her blood  pressure is 114/79 and her pulse is 76. Her respiration is 16.       moderate to minimal hyperpigmentation in the treatment area. No desquamation.   ASSESSMENT: The patient is doing satisfactorily with treatment.  PLAN: We will continue with the patient's radiation treatment as planned. The patient will get [place] whatever paperwork needs to be filled out for her work hours.

## 2012-11-04 ENCOUNTER — Ambulatory Visit
Admission: RE | Admit: 2012-11-04 | Discharge: 2012-11-04 | Disposition: A | Discharge status: Home / Self Care | Payer: PRIVATE HEALTH INSURANCE | Source: Ambulatory Visit | Attending: Radiation Oncology | Admitting: Radiation Oncology

## 2012-11-05 ENCOUNTER — Ambulatory Visit
Admission: RE | Admit: 2012-11-05 | Discharge: 2012-11-05 | Disposition: A | Discharge status: Home / Self Care | Payer: PRIVATE HEALTH INSURANCE | Source: Ambulatory Visit | Attending: Radiation Oncology | Admitting: Radiation Oncology

## 2012-11-05 ENCOUNTER — Telehealth: Payer: Self-pay | Admitting: Medical Oncology

## 2012-11-05 MED ORDER — POTASSIUM CHLORIDE CRYS ER 20 MEQ PO TBCR: 20.0000 meq | EXTENDED_RELEASE_TABLET | Freq: Every day | ORAL | Status: DC

## 2012-11-05 NOTE — Telephone Encounter (Signed)
Per Augustin Schooling NP, informed pt potassium is low and for patient to take Kdur 20 meq by mouth, 1 tablet every day for 5 days and that prescription called to her pharmacy. Patient expressed verbal understanding. Knows to call office with questions or concerns.

## 2012-11-05 NOTE — Telephone Encounter (Signed)
Message copied by Rexene Edison on Tue Nov 05, 2012  3:02 PM ------      Message from: Laural Golden      Created: Tue Nov 05, 2012  8:49 AM       Patient has low potassium.  Please call in Kdur po daily x 5 days, disp 5 no refills.  Please call patient and inform her.            Thanks,       L      ----- Message -----         From: [facility] Interface         Sent: 11/01/2012   1:25 PM           To: Victorino December, MD                   ------

## 2012-11-06 ENCOUNTER — Ambulatory Visit
Admission: RE | Admit: 2012-11-06 | Discharge: 2012-11-06 | Disposition: A | Discharge status: Home / Self Care | Payer: PRIVATE HEALTH INSURANCE | Source: Ambulatory Visit | Attending: Radiation Oncology | Admitting: Radiation Oncology

## 2012-11-06 ENCOUNTER — Encounter: Payer: Self-pay | Admitting: Radiation Oncology

## 2012-11-06 DIAGNOSIS — C50412 Malignant neoplasm of upper-outer quadrant of left female breast: Secondary | ICD-10-CM

## 2012-11-06 NOTE — Progress Notes (Signed)
Weekly Management Note:  Site: Left chest wall/regional lymph nodes Current Dose:  3780  cGy Projected Dose: 5040  cGy  Narrative: The patient is seen today for routine under treatment assessment. CBCT/MVCT images/port films were reviewed. The chart was reviewed.   The patient is seen today prior to her scheduled treatment of for evaluation of new vesicles seen along her left axilla. These are slightly tender to palpation. There is been no drainage.  Physical Examination: There were no vitals filed for this visit..  Weight:  . On inspection left axilla there is a group of clear vesicles over an area of 3.0 cm, with the largest vesicle measure 1.3 cm. There is no drainage. There is no dermatome distribution or any vesicles seen along her back.  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 There is no evidence to suggest that this may represent shingles.  Plan: Continue radiation therapy as planned. I instructed her to apply antibiotic ointment twice a day, and when the vesicles rupture she is to continue with antibiotic ointment and use a Telfa dressing with paper tape if necessary. I would continue radiation therapy for the time being.

## 2012-11-06 NOTE — Progress Notes (Addendum)
Patient brought to nursing for left breast c/o blister under4 axilla and swelling next to axilla, noticed last night when applying radiaplex gel. Per Dr.Murray, patient to be treated today, gave samples of neosporin  And telfa  Dressings and paper tape to apply one v=blister starts to drain.,patient gave verbal understanding

## 2012-11-07 ENCOUNTER — Ambulatory Visit
Admission: RE | Admit: 2012-11-07 | Discharge: 2012-11-07 | Disposition: A | Discharge status: Home / Self Care | Payer: PRIVATE HEALTH INSURANCE | Source: Ambulatory Visit | Attending: Radiation Oncology | Admitting: Radiation Oncology

## 2012-11-08 ENCOUNTER — Encounter: Payer: Self-pay | Admitting: Radiation Oncology

## 2012-11-08 ENCOUNTER — Ambulatory Visit
Admission: RE | Admit: 2012-11-08 | Discharge: 2012-11-08 | Disposition: A | Discharge status: Home / Self Care | Payer: PRIVATE HEALTH INSURANCE | Source: Ambulatory Visit | Attending: Radiation Oncology | Admitting: Radiation Oncology

## 2012-11-08 VITALS — BP 132/83 | HR 76 | Temp 97.0°F | Resp 16 | Wt 167.4 lb

## 2012-11-08 DIAGNOSIS — C50412 Malignant neoplasm of upper-outer quadrant of left female breast: Secondary | ICD-10-CM

## 2012-11-08 NOTE — Progress Notes (Signed)
Patient presents to the clinic today unaccompanied for PUT with Dr. Mitzi Hansen. Patient alert and oriented to person, place, and time. No distress noted. Patient being pushed in wheelchair. Pleasant affect noted. Patient denies pain at this time but, does reports undescribable left chest wall discomfort. Only faint hyperpigmentation without desquamation noted. Patient reports using radiaplex as directed bid. Patient reports fatigue, but confirms she got a good nights rest last night. Reported all findings to Dr. Mitzi Hansen.

## 2012-11-08 NOTE — Progress Notes (Signed)
   Department of Radiation Oncology  Phone:  [phone] Fax:        [phone]  Weekly Treatment Note    Name: Alicia Pena Date: 11/08/2012 MRN: [id number] DOB: Jul 22, 1970   Current dose: 43.2 Gy  Current fraction: 24   MEDICATIONS: Current Outpatient Prescriptions  Medication Sig Dispense Refill  . acetaminophen (TYLENOL) 325 MG tablet Take 650 mg by mouth every 6 (six) hours as needed.      . ALPRAZolam (XANAX) 0.25 MG tablet Take 1 tablet (0.25 mg total) by mouth at bedtime as needed for sleep.  10 tablet  0  . ibuprofen (ADVIL,MOTRIN) 200 MG tablet Take 200 mg by mouth every 6 (six) hours as needed.      . lidocaine-prilocaine (EMLA) cream Apply topically as needed.  30 g  2  . LORazepam (ATIVAN) 1 MG tablet       . Multiple Vitamin (MULTIVITAMIN WITH MINERALS) TABS Take 1 tablet by mouth daily.      . potassium chloride SA (K-DUR,KLOR-CON) 20 MEQ tablet Take 1 tablet (20 mEq total) by mouth daily. Take 1 tablet by mouth daily for 5 days.  5 tablet  0  . venlafaxine XR (EFFEXOR-XR) 37.5 MG 24 hr capsule Take 1 capsule (37.5 mg total) by mouth daily.  30 capsule  0   No current facility-administered medications for this encounter.     ALLERGIES: Duricef   LABORATORY DATA:  Lab Results  Component Value Date   WBC 6.1 11/01/2012   HGB 9.8* 11/01/2012   HCT 31.3* 11/01/2012   MCV 78.6* 11/01/2012   PLT 226 11/01/2012   Lab Results  Component Value Date   NA 143 11/01/2012   K 3.3* 11/01/2012   CL 109* 11/01/2012   CO2 26 11/01/2012   Lab Results  Component Value Date   ALT 24 11/01/2012   AST 17 11/01/2012   ALKPHOS 75 11/01/2012   BILITOT 0.71 11/01/2012     NARRATIVE: Alicia Pena was seen today for weekly treatment management. The chart was checked and the patient's films were reviewed. The patient is doing better today. Her skin feels better. She is notice some increased color changes.  PHYSICAL EXAMINATION: weight is 167 lb 6.4 oz (75.932 kg). Her oral temperature is 97  F (36.1 C). Her blood pressure is 132/83 and her pulse is 76. Her respiration is 16.      the vesicles in the left axilla remain. They remain intact. Increased hyperpigmentation in the treatment area. No moist desquamation.  ASSESSMENT: The patient is doing satisfactorily with treatment.  PLAN: We will continue with the patient's radiation treatment as planned. She will continue her current skin care. The axilla is holding up fine currently.

## 2012-11-11 ENCOUNTER — Ambulatory Visit
Admission: RE | Admit: 2012-11-11 | Discharge: 2012-11-11 | Disposition: A | Discharge status: Home / Self Care | Payer: PRIVATE HEALTH INSURANCE | Source: Ambulatory Visit | Attending: Radiation Oncology | Admitting: Radiation Oncology

## 2012-11-12 ENCOUNTER — Ambulatory Visit
Admission: RE | Admit: 2012-11-12 | Discharge: 2012-11-12 | Disposition: A | Discharge status: Home / Self Care | Payer: PRIVATE HEALTH INSURANCE | Source: Ambulatory Visit | Attending: Radiation Oncology | Admitting: Radiation Oncology

## 2012-11-13 ENCOUNTER — Ambulatory Visit
Admission: RE | Admit: 2012-11-13 | Discharge: 2012-11-13 | Disposition: A | Discharge status: Home / Self Care | Payer: PRIVATE HEALTH INSURANCE | Source: Ambulatory Visit | Attending: Radiation Oncology | Admitting: Radiation Oncology

## 2012-11-14 ENCOUNTER — Ambulatory Visit
Admission: RE | Admit: 2012-11-14 | Discharge: 2012-11-14 | Disposition: A | Discharge status: Home / Self Care | Payer: PRIVATE HEALTH INSURANCE | Source: Ambulatory Visit | Attending: Radiation Oncology | Admitting: Radiation Oncology

## 2012-11-14 ENCOUNTER — Ambulatory Visit: Payer: PRIVATE HEALTH INSURANCE

## 2012-11-15 ENCOUNTER — Ambulatory Visit
Admission: RE | Admit: 2012-11-15 | Discharge: 2012-11-15 | Disposition: A | Discharge status: Home / Self Care | Payer: PRIVATE HEALTH INSURANCE | Source: Ambulatory Visit | Attending: Radiation Oncology | Admitting: Radiation Oncology

## 2012-11-15 ENCOUNTER — Ambulatory Visit: Payer: PRIVATE HEALTH INSURANCE

## 2012-11-15 VITALS — BP 143/89 | HR 75 | Temp 97.9°F | Wt 166.3 lb

## 2012-11-15 DIAGNOSIS — C50412 Malignant neoplasm of upper-outer quadrant of left female breast: Secondary | ICD-10-CM

## 2012-11-15 NOTE — Progress Notes (Signed)
Patient here for routine weekly assessment of radiation to left chest wall.Started 1 of 5 of boost today.Skin hyperpigmented with dry peel of left  Axilla.Denies pain.Will give additional tube of moisturizer today.Generalized fatigue.No questions/concerns.

## 2012-11-15 NOTE — Progress Notes (Signed)
  Radiation Oncology         [phone] ________________________________  Name: Alicia Pena MRN: [id number]  Date: 11/06/2012  DOB: Dec 16, 1970  Complex simulation note  The patient has undergone complex simulation for her upcoming boost treatment for her diagnosis of breast cancer. The patient has initially been planned to receive 50.4 gray. The patient will now receive a 10 gray boost to the mastectomy scar which has been contoured. This will be accomplished using an en face electron field. Based on the depth of the target area, 9 MeV electrons will be used and this field has been normalized to the 92% isodose line. Bolus will be used at a depth of 0.8 cm. The patient's final total dose therefore will be 60.4 gray. A special port plan is requested for the boost treatment.   _______________________________  Radene Gunning, MD, PhD

## 2012-11-15 NOTE — Progress Notes (Signed)
   Department of Radiation Oncology  Phone:  [phone] Fax:        [phone]  Weekly Treatment Note    Name: Alicia Pena Date: 11/15/2012 MRN: [id number] DOB: 10-16-1970   Current dose: 52.4 Gy  Current fraction: 29   MEDICATIONS: Current Outpatient Prescriptions  Medication Sig Dispense Refill  . acetaminophen (TYLENOL) 325 MG tablet Take 650 mg by mouth every 6 (six) hours as needed.      . ALPRAZolam (XANAX) 0.25 MG tablet Take 1 tablet (0.25 mg total) by mouth at bedtime as needed for sleep.  10 tablet  0  . ibuprofen (ADVIL,MOTRIN) 200 MG tablet Take 200 mg by mouth every 6 (six) hours as needed.      . lidocaine-prilocaine (EMLA) cream Apply topically as needed.  30 g  2  . LORazepam (ATIVAN) 1 MG tablet       . Multiple Vitamin (MULTIVITAMIN WITH MINERALS) TABS Take 1 tablet by mouth daily.      . potassium chloride SA (K-DUR,KLOR-CON) 20 MEQ tablet Take 1 tablet (20 mEq total) by mouth daily. Take 1 tablet by mouth daily for 5 days.  5 tablet  0  . venlafaxine XR (EFFEXOR-XR) 37.5 MG 24 hr capsule Take 1 capsule (37.5 mg total) by mouth daily.  30 capsule  0   No current facility-administered medications for this encounter.     ALLERGIES: Duricef   LABORATORY DATA:  Lab Results  Component Value Date   WBC 6.1 11/01/2012   HGB 9.8* 11/01/2012   HCT 31.3* 11/01/2012   MCV 78.6* 11/01/2012   PLT 226 11/01/2012   Lab Results  Component Value Date   NA 143 11/01/2012   K 3.3* 11/01/2012   CL 109* 11/01/2012   CO2 26 11/01/2012   Lab Results  Component Value Date   ALT 24 11/01/2012   AST 17 11/01/2012   ALKPHOS 75 11/01/2012   BILITOT 0.71 11/01/2012     NARRATIVE: Alicia Pena was seen today for weekly treatment management. The chart was checked and the patient's films were reviewed. The patient is doing well. No new complaints. Some ongoing skin irritation. She began her boost treatment today.  PHYSICAL EXAMINATION: weight is 166 lb 4.8 oz (75.433 kg). Her  temperature is 97.9 F (36.6 C). Her blood pressure is 143/89 and her pulse is 75.      dry desquamation in the upper axilla. Some diffuse skin change with erythema/hyperpigmentation. No moist desquamation. The boost site looks good.  ASSESSMENT: The patient is doing satisfactorily with treatment.  PLAN: We will continue with the patient's radiation treatment as planned. She will continue her current skin cream/care.

## 2012-11-16 ENCOUNTER — Ambulatory Visit: Payer: PRIVATE HEALTH INSURANCE

## 2012-11-18 ENCOUNTER — Ambulatory Visit
Admission: RE | Admit: 2012-11-18 | Discharge: 2012-11-18 | Disposition: A | Discharge status: Home / Self Care | Payer: PRIVATE HEALTH INSURANCE | Source: Ambulatory Visit | Attending: Radiation Oncology | Admitting: Radiation Oncology

## 2012-11-19 ENCOUNTER — Ambulatory Visit
Admission: RE | Admit: 2012-11-19 | Discharge: 2012-11-19 | Disposition: A | Discharge status: Home / Self Care | Payer: PRIVATE HEALTH INSURANCE | Source: Ambulatory Visit | Attending: Radiation Oncology | Admitting: Radiation Oncology

## 2012-11-20 ENCOUNTER — Ambulatory Visit
Admission: RE | Admit: 2012-11-20 | Discharge: 2012-11-20 | Disposition: A | Discharge status: Home / Self Care | Payer: PRIVATE HEALTH INSURANCE | Source: Ambulatory Visit | Attending: Radiation Oncology | Admitting: Radiation Oncology

## 2012-11-21 ENCOUNTER — Ambulatory Visit
Admission: RE | Admit: 2012-11-21 | Discharge: 2012-11-21 | Disposition: A | Discharge status: Home / Self Care | Payer: PRIVATE HEALTH INSURANCE | Source: Ambulatory Visit | Attending: Radiation Oncology | Admitting: Radiation Oncology

## 2012-11-21 ENCOUNTER — Encounter: Payer: Self-pay | Admitting: Radiation Oncology

## 2012-11-21 VITALS — BP 141/89 | HR 83 | Temp 98.1°F | Resp 20 | Wt 169.0 lb

## 2012-11-21 DIAGNOSIS — C50412 Malignant neoplasm of upper-outer quadrant of left female breast: Secondary | ICD-10-CM

## 2012-11-21 NOTE — Progress Notes (Signed)
Pt denies pain but states when she raises her left arm it is sore in her axilla. Pt denies loss of appetite, is fatigued. Pt applying Radiaplex lotion to left chest wall, left axilla has dry desquamation. Advised she ask dr if she should apply Neosporin to axilla.  She completed tx today, gave her FU card.

## 2012-11-21 NOTE — Progress Notes (Signed)
   Department of Radiation Oncology  Phone:  [phone] Fax:        [phone]  Weekly Treatment Note    Name: Alicia Pena Date: 11/21/2012 MRN: [id number] DOB: 1970-07-11   Current dose: 60.4 Gy  Current fraction: 33   MEDICATIONS: Current Outpatient Prescriptions  Medication Sig Dispense Refill  . acetaminophen (TYLENOL) 325 MG tablet Take 650 mg by mouth every 6 (six) hours as needed.      . ALPRAZolam (XANAX) 0.25 MG tablet Take 1 tablet (0.25 mg total) by mouth at bedtime as needed for sleep.  10 tablet  0  . ibuprofen (ADVIL,MOTRIN) 200 MG tablet Take 200 mg by mouth every 6 (six) hours as needed.      . lidocaine-prilocaine (EMLA) cream Apply topically as needed.  30 g  2  . LORazepam (ATIVAN) 1 MG tablet       . Multiple Vitamin (MULTIVITAMIN WITH MINERALS) TABS Take 1 tablet by mouth daily.      . potassium chloride SA (K-DUR,KLOR-CON) 20 MEQ tablet Take 1 tablet (20 mEq total) by mouth daily. Take 1 tablet by mouth daily for 5 days.  5 tablet  0  . venlafaxine XR (EFFEXOR-XR) 37.5 MG 24 hr capsule Take 1 capsule (37.5 mg total) by mouth daily.  30 capsule  0   No current facility-administered medications for this encounter.     ALLERGIES: Duricef   LABORATORY DATA:  Lab Results  Component Value Date   WBC 6.1 11/01/2012   HGB 9.8* 11/01/2012   HCT 31.3* 11/01/2012   MCV 78.6* 11/01/2012   PLT 226 11/01/2012   Lab Results  Component Value Date   NA 143 11/01/2012   K 3.3* 11/01/2012   CL 109* 11/01/2012   CO2 26 11/01/2012   Lab Results  Component Value Date   ALT 24 11/01/2012   AST 17 11/01/2012   ALKPHOS 75 11/01/2012   BILITOT 0.71 11/01/2012     NARRATIVE: Alicia Pena was seen today for weekly treatment management. The chart was checked and the patient's films were reviewed. The patient finished her final fraction today. She did well during treatment overall. Continued skin irritation, especially in the axillary region.  PHYSICAL EXAMINATION: weight is 169 lb  (76.658 kg). Her oral temperature is 98.1 F (36.7 C). Her blood pressure is 141/89 and her pulse is 83. Her respiration is 20.      diffuse hyperpigmentation and erythema. The boost site looks quite good. The greatest irritation is in the left axilla with some desquamation. No moist cyst on exam today.  ASSESSMENT: The patient did satisfactorily with treatment. She had some skin irritation towards the end of treatment as expected. I believe that this will heal satisfactorily over the next couple of weeks. She will use Neosporin on the left axilla.  PLAN: Followup in one month.

## 2012-11-22 ENCOUNTER — Ambulatory Visit (HOSPITAL_BASED_OUTPATIENT_CLINIC_OR_DEPARTMENT_OTHER): Payer: PRIVATE HEALTH INSURANCE

## 2012-11-22 ENCOUNTER — Encounter: Payer: Self-pay | Admitting: Oncology

## 2012-11-22 ENCOUNTER — Other Ambulatory Visit (HOSPITAL_BASED_OUTPATIENT_CLINIC_OR_DEPARTMENT_OTHER): Payer: PRIVATE HEALTH INSURANCE | Admitting: Lab

## 2012-11-22 ENCOUNTER — Ambulatory Visit (HOSPITAL_BASED_OUTPATIENT_CLINIC_OR_DEPARTMENT_OTHER): Payer: PRIVATE HEALTH INSURANCE | Admitting: Adult Health

## 2012-11-22 ENCOUNTER — Encounter: Payer: Self-pay | Admitting: Adult Health

## 2012-11-22 VITALS — BP 155/81 | HR 97 | Temp 98.2°F | Resp 20 | Ht 60.0 in | Wt 168.2 lb

## 2012-11-22 DIAGNOSIS — C50919 Malignant neoplasm of unspecified site of unspecified female breast: Secondary | ICD-10-CM

## 2012-11-22 DIAGNOSIS — C50419 Malignant neoplasm of upper-outer quadrant of unspecified female breast: Secondary | ICD-10-CM

## 2012-11-22 DIAGNOSIS — F411 Generalized anxiety disorder: Secondary | ICD-10-CM

## 2012-11-22 DIAGNOSIS — R29898 Other symptoms and signs involving the musculoskeletal system: Secondary | ICD-10-CM

## 2012-11-22 DIAGNOSIS — C50912 Malignant neoplasm of unspecified site of left female breast: Secondary | ICD-10-CM

## 2012-11-22 DIAGNOSIS — F419 Anxiety disorder, unspecified: Secondary | ICD-10-CM

## 2012-11-22 DIAGNOSIS — C50412 Malignant neoplasm of upper-outer quadrant of left female breast: Secondary | ICD-10-CM

## 2012-11-22 DIAGNOSIS — Z5112 Encounter for antineoplastic immunotherapy: Secondary | ICD-10-CM

## 2012-11-22 LAB — CBC WITH DIFFERENTIAL/PLATELET
BASO%: 0.4 % (ref 0.0–2.0)
EOS%: 4 % (ref 0.0–7.0)
HCT: 33 % — ABNORMAL LOW (ref 34.8–46.6)
MCH: 23.7 pg — ABNORMAL LOW (ref 25.1–34.0)
MCHC: 30.9 g/dL — ABNORMAL LOW (ref 31.5–36.0)
MONO#: 0.4 10*3/uL (ref 0.1–0.9)
NEUT%: 70.6 % (ref 38.4–76.8)
RBC: 4.31 10*6/uL (ref 3.70–5.45)
RDW: 18.4 % — ABNORMAL HIGH (ref 11.2–14.5)
WBC: 4.5 10*3/uL (ref 3.9–10.3)
lymph#: 0.8 10*3/uL — ABNORMAL LOW (ref 0.9–3.3)

## 2012-11-22 LAB — COMPREHENSIVE METABOLIC PANEL (CC13)
ALT: 28 U/L (ref 0–55)
AST: 21 U/L (ref 5–34)
Albumin: 3.1 g/dL — ABNORMAL LOW (ref 3.5–5.0)
CO2: 25 mEq/L (ref 22–29)
Calcium: 8.8 mg/dL (ref 8.4–10.4)
Chloride: 108 mEq/L — ABNORMAL HIGH (ref 98–107)
Creatinine: 0.7 mg/dL (ref 0.6–1.1)
Potassium: 3.6 mEq/L (ref 3.5–5.1)
Sodium: 142 mEq/L (ref 136–145)
Total Protein: 6.7 g/dL (ref 6.4–8.3)

## 2012-11-22 MED ORDER — HEPARIN SOD (PORK) LOCK FLUSH 100 UNIT/ML IV SOLN
500.0000 [IU] | Freq: Once | INTRAVENOUS | Status: AC | PRN
  Administered 2012-11-22: 500 [IU]
  Filled 2012-11-22: qty 5

## 2012-11-22 MED ORDER — VENLAFAXINE HCL ER 37.5 MG PO CP24: 37.5000 mg | ORAL_CAPSULE | Freq: Every day | ORAL | Status: DC

## 2012-11-22 MED ORDER — ACETAMINOPHEN 325 MG PO TABS
650.0000 mg | ORAL_TABLET | Freq: Once | ORAL | Status: AC
  Administered 2012-11-22: 650 mg via ORAL

## 2012-11-22 MED ORDER — DIPHENHYDRAMINE HCL 25 MG PO CAPS
50.0000 mg | ORAL_CAPSULE | Freq: Once | ORAL | Status: AC
  Administered 2012-11-22: 50 mg via ORAL

## 2012-11-22 MED ORDER — SODIUM CHLORIDE 0.9 % IV SOLN
Freq: Once | INTRAVENOUS | Status: AC
  Administered 2012-11-22: 13:00:00 via INTRAVENOUS

## 2012-11-22 MED ORDER — TRASTUZUMAB CHEMO INJECTION 440 MG
6.0000 mg/kg | Freq: Once | INTRAVENOUS | Status: AC
  Administered 2012-11-22: 462 mg via INTRAVENOUS
  Filled 2012-11-22: qty 22

## 2012-11-22 MED ORDER — TAMOXIFEN CITRATE 20 MG PO TABS: 20.0000 mg | ORAL_TABLET | Freq: Every day | ORAL | Status: DC

## 2012-11-22 MED ORDER — SODIUM CHLORIDE 0.9 % IJ SOLN
10.0000 mL | INTRAMUSCULAR | Status: DC | PRN
  Administered 2012-11-22: 10 mL
  Filled 2012-11-22: qty 10

## 2012-11-22 NOTE — Progress Notes (Signed)
OFFICE PROGRESS NOTE  CC  Alicia Pena, [name], MD [address] [place] [address] Dr. Emelia Loron Dr. 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 Originally diagnosed in September 2013.  PRIOR THERAPY:  #1Patient originally presented in September 2013 when she noticed dimpling within the left breast. She subsequently had a mammogram that showed suspicious findings. Further workup showed multiple areas of concern. There was noted to be a 2.8 cm spiculated mass within the upper outer quadrant of the left breast. The pathology did show invasive ductal carcinoma from the upper-outer quadrant of the left breast. There was also noted to be a 9 mm enhancing mass which was 2.2 cm anterior to the known area of malignancy. A third mass measuring 8 mm was 2.1 cm anterior to the known area of malignancy. Also a satellite spiculated mass was present 8 mm and a 4 mm rounded mass in the 12:00. She did have additional biopsies and was diagnosed With multicentric disease. There were also noted to be couple of small areas of concern in the right breast but these were negative for malignancy on biopsy.  #2 patient ultimately proceeded to undergo simple mastectomy on the left with sentinel lymph node biopsy on 03/21/2012. The final pathology within the left breast showed multifocal invasive and in situ ductal carcinoma. 4 distinct areas were seen measuring 3.2 cm, 2.3 cm, 1.3 cm, and 0.8 cm. All margins were negative. There was focal lymphatic vascular involvement and normal involvement by tumor. 2 sentinel nodes were positive for metastatic carcinoma with extracapsular extension. The tumors were ER positive PR positive and HER-2/neu positive.  #3 patient did undergo axillary lymph node dissection of the left axilla On 04/04/2012 additional 8 lymph nodes were removed. I have those 3 were positive therefore patient had 5/10 lymph nodes  positive for metastatic disease. With the final pathologic staging at T2 N2a.  #4 patient was begun on adjuvant systemic chemotherapy by Dr. Pierce Crane consisting of Taxotere carboplatinum given every 21 days with Herceptin given on a weekly basis for the duration of Taxotere and carbo platinum. Her chemotherapy began On 05/03/2012. She received a total of 6 cycles of this.  #5 after completion of chemotherapy she will received radiation therapy under the care of Dr. Dorothy Puffer from 10/08/12 through 11/21/12.  #6 She began q 3 week herceptin on 10/11/12.  Tamoxifen added on 11/22/12.   CURRENT THERAPY: every 3 week Herceptin and Tamoxifen  INTERVAL HISTORY: Alicia Pena 42 y.o. female returns for Followup visit prior to her next herceptin dose.  She is feeling well today.  She has completed radiation therapy.  The Effexor prescribed has helped a lot with her mood swings at work.  She is having no other problems taking it.  She is having some redness on her  Chest and fatigue.  She has not been going to physical therapy due to weakness and fatigue, as well as difficulty scheduling it.  She plans on restarting in about one month.  She does have erythema and skin peeling from the radiation, but otherwise, a 10 point ROS is neg.  Her last echo was 10/04/12.     MEDICAL HISTORY: Past Medical History  Diagnosis Date  . Anemia   . Anxiety     does not like needles ..   . 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 Breast cancer 03/21/2012    left multifocal invasive,  in situ ca,margins not involved,ER/PR=+,metastatic (1/1) node  . Allergy   . Complication of anesthesia     ALLERGIES:  is allergic to duricef.  MEDICATIONS:  Current Outpatient Prescriptions  Medication Sig Dispense Refill  . acetaminophen (TYLENOL) 325 MG tablet Take 650 mg by mouth every 6 (six) hours as needed.      . ALPRAZolam (XANAX) 0.25 MG tablet Take 1 tablet (0.25 mg total) by mouth at bedtime as needed for sleep.  10 tablet  0  .  ibuprofen (ADVIL,MOTRIN) 200 MG tablet Take 200 mg by mouth every 6 (six) hours as needed.      . lidocaine-prilocaine (EMLA) cream Apply topically as needed.  30 g  2  . LORazepam (ATIVAN) 1 MG tablet       . Multiple Vitamin (MULTIVITAMIN WITH MINERALS) TABS Take 1 tablet by mouth daily.      . potassium chloride SA (K-DUR,KLOR-CON) 20 MEQ tablet Take 1 tablet (20 mEq total) by mouth daily. Take 1 tablet by mouth daily for 5 days.  5 tablet  0  . venlafaxine XR (EFFEXOR-XR) 37.5 MG 24 hr capsule Take 1 capsule (37.5 mg total) by mouth daily.  30 capsule  0   No current facility-administered medications for this visit.    SURGICAL HISTORY:  Past Surgical History  Procedure Laterality Date  . Leg surgery      LLE  . Eye surgery      as a child  . Mastectomy complete / simple w/ sentinel node biopsy  03/21/2012    left Dr.Matthew Dwain Sarna  . Breast biopsy  89/07/2011    left breast uoq bx=invasive ductal ca  . Breast biopsy bi lateral  02/15/12    left = invasive ductal, right breast= fibroadenoma,fibrocystic changes,no atypia,hyperlasia or malignancy identified  . Abdominal hysterectomy  2006    w/o oophorectomy  . Axillary lymph node dissection  04/04/2012    Procedure: AXILLARY LYMPH NODE DISSECTION;  Surgeon: Emelia Loron, MD;  Location: [facility];  Service: General;  Laterality: Left;  Left Axillary lymph node dissection, Port a cath placement  . Portacath placement  04/04/2012    Procedure: INSERTION PORT-A-CATH;  Surgeon: Emelia Loron, MD;  Location: [facility];  Service: General;  Laterality: Right;    REVIEW OF SYSTEMS:   General: fatigue (+), night sweats (-), fever (-), pain (-) Lymph: palpable nodes (-) HEENT: vision changes (-), mucositis (-), gum bleeding (-), epistaxis (-) Cardiovascular: chest pain (-), palpitations (-) Pulmonary: shortness of breath (-), dyspnea on exertion (-), cough (-), hemoptysis (-) GI:  Early satiety (-),  melena (-), dysphagia (-), nausea/vomiting (-), diarrhea (-) GU: dysuria (-), hematuria (-), incontinence (-) Musculoskeletal: joint swelling (-), joint pain (-), back pain (-) Neuro: weakness (+), numbness (-), headache (-), confusion (-) Skin: Rash (-), lesions (-), dryness (-) Psych: depression (-), suicidal/homicidal ideation (-), feeling of hopelessness (-)    PHYSICAL EXAMINATION: Blood pressure 155/81, pulse 97, temperature 98.2 F (36.8 C), temperature source Oral, resp. rate 20, height 5' (1.524 m), weight 168 lb 3.2 oz (76.295 kg). Body mass index is 32.85 kg/(m^2). General: Patient is a well appearing female in no acute distress HEENT: PERRLA, sclerae anicteric no conjunctival pallor, MMM Neck: supple, no palpable adenopathy Lungs: clear to auscultation bilaterally, no wheezes, rhonchi, or rales Cardiovascular: regular rate rhythm, S1, S2, no murmurs, rubs or gallops Abdomen: Soft, non-tender, non-distended, normoactive bowel sounds, no HSM Extremities: warm and well perfused, no clubbing, cyanosis,  or edema Skin: No rashes or lesions Neuro: Non-focal Breasts: left breast mastectomy site erythematous, skin peeling under axilla, nontender Right breast no nodularity or masses. ECOG PERFORMANCE STATUS: 1 - Symptomatic but completely ambulatory   LABORATORY DATA: Lab Results  Component Value Date   WBC 4.5 11/22/2012   HGB 10.2* 11/22/2012   HCT 33.0* 11/22/2012   MCV 76.6* 11/22/2012   PLT 245 11/22/2012      Chemistry      Component Value Date/Time   NA 143 11/01/2012 1315   NA 138 04/02/2012 1100   K 3.3* 11/01/2012 1315   K 3.9 04/02/2012 1100   CL 109* 11/01/2012 1315   CL 103 04/02/2012 1100   CO2 26 11/01/2012 1315   CO2 25 04/02/2012 1100   BUN 16.3 11/01/2012 1315   BUN 16 04/02/2012 1100   CREATININE 0.7 11/01/2012 1315   CREATININE 0.49* 04/02/2012 1100      Component Value Date/Time   CALCIUM 8.7 11/01/2012 1315   CALCIUM 9.0 04/02/2012 1100   ALKPHOS 75 11/01/2012 1315    AST 17 11/01/2012 1315   ALT 24 11/01/2012 1315   BILITOT 0.71 11/01/2012 1315       RADIOGRAPHIC STUDIES:  No results found.  ASSESSMENT: 42 year old female with  #1 T2 N2 A. Invasive ductal carcinoma of the left breast that was multifocal she is status post mastectomy with axillary lymph node dissection in September October 2013. Patient's tumor was ER positive PR positive HER-2/neu positive.  #2 she completed adjuvant chemotherapy consisting of Taxotere carboplatinum and Herceptin total of 6 cycles of therapy as planned. She then started every three week herceptin, and received 6 weeks of radiation therapy.    #3 patient had last echocardiogram-4/4 echo- EF 50-55%, Dr. Gala Romney cleared her to continue Herceptin.   #4 patient is complaining of increasing weakness in her lower extremities. She does have underlying cerebral palsy. She sees physical therapy for this.  #5 Patient is ER PR positive, and will start on Tamoxifen.  Risks and benefits were explained to the patient.  She is s/p TAH, she will get her eye exams annually, and  PLAN:   #1 Proceed with every three week Herceptin.  Her last echo was 4/4 and she saw Dr. Gala Romney who cleared her to continue Herceptin therapy.    #2 She will start Tamoxifen, I gave her detailed information in her AVS regarding the medication and reviewed common adverse effects, and reasons to call the office with her, we will see her back in 3 weeks for her next Herceptin treatment.    #3 I prescribed Effexor XR for the moodiness and anxiety.  This has helped her tremendously and she is doing well on this medication.   All questions were answered. The patient knows to call the clinic with any problems, questions or concerns. We can certainly see the patient much sooner if necessary.  I spent 25 minutes counseling the patient face to face. The total time spent in the appointment was 30 minutes.  Cherie Ouch Lyn Hollingshead, NP Medical Oncology [facility] Phone: [phone] 11/22/2012, 12:11 PM

## 2012-11-22 NOTE — Patient Instructions (Addendum)
[facility] Discharge Instructions for Patients Receiving Chemotherapy  Today you received the following chemotherapy agents Herceptin.  To help prevent nausea and vomiting after your treatment, we encourage you to take your nausea medication as ordered per MD.    If you develop nausea and vomiting that is not controlled by your nausea medication, call the clinic. If it is after clinic hours your family physician or the after hours number for the clinic or go to the Emergency Department.   BELOW ARE SYMPTOMS THAT SHOULD BE REPORTED IMMEDIATELY:  *FEVER GREATER THAN 100.5 F  *CHILLS WITH OR WITHOUT FEVER  NAUSEA AND VOMITING THAT IS NOT CONTROLLED WITH YOUR NAUSEA MEDICATION  *UNUSUAL SHORTNESS OF BREATH  *UNUSUAL BRUISING OR BLEEDING  TENDERNESS IN MOUTH AND THROAT WITH OR WITHOUT PRESENCE OF ULCERS  *URINARY PROBLEMS  *BOWEL PROBLEMS  UNUSUAL RASH Items with * indicate a potential emergency and should be followed up as soon as possible.  . Please let the nurse know about any problems that you may have experienced. Feel free to call the clinic you have any questions or concerns. The clinic phone number is [phone].   I have been informed and understand all the instructions given to me. I know to contact the clinic, my physician, or go to the Emergency Department if any problems should occur. I do not have any questions at this time, but understand that I may call the clinic during office hours   should I have any questions or need assistance in obtaining follow up care.    __________________________________________  _____________  __________ Signature of Patient or Authorized Representative            Date                   Time    __________________________________________ Nurse's Signature    

## 2012-11-22 NOTE — Patient Instructions (Addendum)

## 2012-11-23 NOTE — Progress Notes (Signed)
OFFICE PROGRESS NOTE  CC  Alicia Pena, [name], MD [address] [place] [address] Dr. Emelia Loron Dr. 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 Originally diagnosed in September 2013.  PRIOR THERAPY:  #1Patient originally presented in September 2013 when she noticed dimpling within the left breast. She subsequently had a mammogram that showed suspicious findings. Further workup showed multiple areas of concern. There was noted to be a 2.8 cm spiculated mass within the upper outer quadrant of the left breast. The pathology did show invasive ductal carcinoma from the upper-outer quadrant of the left breast. There was also noted to be a 9 mm enhancing mass which was 2.2 cm anterior to the known area of malignancy. A third mass measuring 8 mm was 2.1 cm anterior to the known area of malignancy. Also a satellite spiculated mass was present 8 mm and a 4 mm rounded mass in the 12:00. She did have additional biopsies and was diagnosed With multicentric disease. There were also noted to be couple of small areas of concern in the right breast but these were negative for malignancy on biopsy.  #2 patient ultimately proceeded to undergo simple mastectomy on the left with sentinel lymph node biopsy on 03/21/2012. The final pathology within the left breast showed multifocal invasive and in situ ductal carcinoma. 4 distinct areas were seen measuring 3.2 cm, 2.3 cm, 1.3 cm, and 0.8 cm. All margins were negative. There was focal lymphatic vascular involvement and normal involvement by tumor. 2 sentinel nodes were positive for metastatic carcinoma with extracapsular extension. The tumors were ER positive PR positive and HER-2/neu positive.  #3 patient did undergo axillary lymph node dissection of the left axilla On 04/04/2012 additional 8 lymph nodes were removed. I have those 3 were positive therefore patient had 5/10 lymph nodes  positive for metastatic disease. With the final pathologic staging at T2 N2a.  #4 patient was begun on adjuvant systemic chemotherapy by Dr. Pierce Crane consisting of Taxotere carboplatinum given every 21 days with Herceptin given on oh weekly basis for the duration of Taxotere and carbo platinum. Her chemotherapy began On 05/03/2012. A total of 6 cycles of TCH combination is planned.  #5 after completion of chemotherapy she will begin radiation therapy and she has RE been seen by Dr. Dorothy Puffer.  CURRENT THERAPY: TCH C6 D1  INTERVAL HISTORY: Alicia Pena 42 y.o. female returns for Followup visit prior to her sixth cycle of TCH.  She's doing well today.  She continues to do physical therapy twice per week and is regaining her strength.  She is doing well today, and a 10 point ROS is neg.   MEDICAL HISTORY: Past Medical History  Diagnosis Date  . Anemia   . Anxiety     does not like needles ..   . 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 Breast cancer 03/21/2012    left multifocal invasive, in situ ca,margins not involved,ER/PR=+,metastatic (1/1) node  . Allergy   . Complication of anesthesia     ALLERGIES:  is allergic to duricef.  MEDICATIONS:  Current Outpatient Prescriptions  Medication Sig Dispense Refill  . acetaminophen (TYLENOL) 325 MG tablet Take 650 mg by mouth every 6 (six) hours as needed.      . ALPRAZolam (XANAX) 0.25 MG tablet Take 1 tablet (0.25 mg total) by mouth at bedtime as needed for sleep.  10 tablet  0  . ibuprofen (  ADVIL,MOTRIN) 200 MG tablet Take 200 mg by mouth every 6 (six) hours as needed.      . lidocaine-prilocaine (EMLA) cream Apply topically as needed.  30 g  2  . LORazepam (ATIVAN) 1 MG tablet       . Multiple Vitamin (MULTIVITAMIN WITH MINERALS) TABS Take 1 tablet by mouth daily.      . tamoxifen (NOLVADEX) 20 MG tablet Take 1 tablet (20 mg total) by mouth daily.  30 tablet  6  . venlafaxine XR (EFFEXOR-XR) 37.5 MG 24 hr capsule Take 1 capsule (37.5 mg  total) by mouth daily.  30 capsule  6   No current facility-administered medications for this visit.    SURGICAL HISTORY:  Past Surgical History  Procedure Laterality Date  . Leg surgery      LLE  . Eye surgery      as a child  . Mastectomy complete / simple w/ sentinel node biopsy  03/21/2012    left Dr.Matthew Dwain Sarna  . Breast biopsy  89/07/2011    left breast uoq bx=invasive ductal ca  . Breast biopsy bi lateral  02/15/12    left = invasive ductal, right breast= fibroadenoma,fibrocystic changes,no atypia,hyperlasia or malignancy identified  . Abdominal hysterectomy  2006    w/o oophorectomy  . Axillary lymph node dissection  04/04/2012    Procedure: AXILLARY LYMPH NODE DISSECTION;  Surgeon: Emelia Loron, MD;  Location: [facility];  Service: General;  Laterality: Left;  Left Axillary lymph node dissection, Port a cath placement  . Portacath placement  04/04/2012    Procedure: INSERTION PORT-A-CATH;  Surgeon: Emelia Loron, MD;  Location: [facility];  Service: General;  Laterality: Right;    REVIEW OF SYSTEMS:   General: fatigue (-), night sweats (-), fever (-), pain (-) Lymph: palpable nodes (-) HEENT: vision changes (-), mucositis (-), gum bleeding (-), epistaxis (-) Cardiovascular: chest pain (-), palpitations (-) Pulmonary: shortness of breath (-), dyspnea on exertion (-), cough (-), hemoptysis (-) GI:  Early satiety (-), melena (-), dysphagia (-), nausea/vomiting (-), diarrhea (-) GU: dysuria (-), hematuria (-), incontinence (-) Musculoskeletal: joint swelling (-), joint pain (-), back pain (-) Neuro: weakness (+), numbness (-), headache (-), confusion (-) Skin: Rash (-), lesions (-), dryness (-) Psych: depression (-), suicidal/homicidal ideation (-), feeling of hopelessness (-)    PHYSICAL EXAMINATION: There were no vitals taken for this visit. There is no weight on file to calculate BMI. General: Patient is a well appearing  female in no acute distress HEENT: PERRLA, sclerae anicteric no conjunctival pallor, MMM Neck: supple, no palpable adenopathy Lungs: clear to auscultation bilaterally, no wheezes, rhonchi, or rales Cardiovascular: regular rate rhythm, S1, S2, no murmurs, rubs or gallops Abdomen: Soft, non-tender, non-distended, normoactive bowel sounds, no HSM Extremities: warm and well perfused, no clubbing, cyanosis, or edema Skin: No rashes or lesions Neuro: Non-focal ECOG PERFORMANCE STATUS: 1 - Symptomatic but completely ambulatory   LABORATORY DATA: Lab Results  Component Value Date   WBC 4.5 11/22/2012   HGB 10.2* 11/22/2012   HCT 33.0* 11/22/2012   MCV 76.6* 11/22/2012   PLT 245 11/22/2012      Chemistry      Component Value Date/Time   NA 142 11/22/2012 1052   NA 138 04/02/2012 1100   K 3.6 11/22/2012 1052   K 3.9 04/02/2012 1100   CL 108* 11/22/2012 1052   CL 103 04/02/2012 1100   CO2 25 11/22/2012 1052   CO2 25 04/02/2012 1100  BUN 13.9 11/22/2012 1052   BUN 16 04/02/2012 1100   CREATININE 0.7 11/22/2012 1052   CREATININE 0.49* 04/02/2012 1100      Component Value Date/Time   CALCIUM 8.8 11/22/2012 1052   CALCIUM 9.0 04/02/2012 1100   ALKPHOS 88 11/22/2012 1052   AST 21 11/22/2012 1052   ALT 28 11/22/2012 1052   BILITOT 0.63 11/22/2012 1052       RADIOGRAPHIC STUDIES:  No results found.  ASSESSMENT: 42 year old female with  #1 T2 N2 A. Invasive ductal carcinoma of the left breast that was multifocal she is status post mastectomy with axillary lymph node dissection in September October 2013. Patient's tumor was ER positive PR positive HER-2/neu positive.  #2 she is currently receiving adjuvant chemotherapy consisting of Taxotere carboplatinum and Herceptin total of 6 cycles of therapy is planned. She is here for cycle #4 Of [facility] today . Overall she is tolerating her treatment quite nicely.  #3 patient will need an echocardiogram-1/15 showed EF of 55-60%.  #4 patient is complaining of  increasing weakness in her lower extremities. She does have underlying cerebral palsy. And she feels since starting her chemotherapy she has had more weakness. I will refer her to physical therapy.   PLAN:   #1 Proceed with weekly chemotherapy.  She will continue her physical therapy and exercises.  We discussed healthy diet and exercise.    #2 She will return tomorrow for Neulasta, and next week for labs, eval and weekly Herceptin.   All questions were answered. The patient knows to call the clinic with any problems, questions or concerns. We can certainly see the patient much sooner if necessary.  I spent 25 minutes counseling the patient face to face. The total time spent in the appointment was 30 minutes.  Drue Second, MD 11/23/2012, 6:38 AM

## 2012-11-27 ENCOUNTER — Telehealth (INDEPENDENT_AMBULATORY_CARE_PROVIDER_SITE_OTHER): Payer: Self-pay

## 2012-11-27 NOTE — Telephone Encounter (Signed)
LMOM for pt to call me back b/c I am trying to r/s her LTF on 7/1 for something sooner per her request. I have a spot i am checking with her on 6/13 that I have on hold right now. I asked for the pt to ask for me when returning my call.

## 2012-12-06 NOTE — Progress Notes (Signed)
  Radiation Oncology         [phone] ________________________________  Name: Alicia Pena MRN: [id number]  Date: 11/21/2012  DOB: 1970/10/17  End of Treatment Note  Diagnosis:   Invasive ductal carcinoma of the left breast     Indication for treatment:  Curative       Radiation treatment dates:   10/08/2012 through 11/21/2012  Site/dose:   The patient was initially treated to the left chest wall and supraclavicular region to a dose of 50.4 gray. This was accomplished using a 4 field, 3-D conformal technique. The patient then was treated with a boost to the scar for an additional 10 gray at 2 gray per fraction. A total dose was 60.4 gray  Narrative: The patient tolerated radiation treatment relatively well.   The patient exhibited some skin changes towards the end of treatment which were moderate. No moist desquamation at the end of treatment.  Plan: The patient has completed radiation treatment. The patient will return to radiation oncology clinic for routine followup in one month. I advised the patient to call or return sooner if they have any questions or concerns related to their recovery or treatment. ________________________________  Radene Gunning, M.D., Ph.D.

## 2012-12-12 ENCOUNTER — Other Ambulatory Visit: Payer: Self-pay | Admitting: *Deleted

## 2012-12-12 DIAGNOSIS — C50419 Malignant neoplasm of upper-outer quadrant of unspecified female breast: Secondary | ICD-10-CM

## 2012-12-13 ENCOUNTER — Other Ambulatory Visit (HOSPITAL_BASED_OUTPATIENT_CLINIC_OR_DEPARTMENT_OTHER): Payer: PRIVATE HEALTH INSURANCE

## 2012-12-13 ENCOUNTER — Ambulatory Visit (HOSPITAL_BASED_OUTPATIENT_CLINIC_OR_DEPARTMENT_OTHER): Payer: PRIVATE HEALTH INSURANCE

## 2012-12-13 ENCOUNTER — Ambulatory Visit (HOSPITAL_BASED_OUTPATIENT_CLINIC_OR_DEPARTMENT_OTHER): Payer: PRIVATE HEALTH INSURANCE | Admitting: Adult Health

## 2012-12-13 ENCOUNTER — Encounter: Payer: Self-pay | Admitting: Adult Health

## 2012-12-13 VITALS — BP 112/78 | HR 69 | Temp 97.0°F | Resp 20 | Ht 60.0 in | Wt 166.5 lb

## 2012-12-13 DIAGNOSIS — C50919 Malignant neoplasm of unspecified site of unspecified female breast: Secondary | ICD-10-CM

## 2012-12-13 DIAGNOSIS — R29898 Other symptoms and signs involving the musculoskeletal system: Secondary | ICD-10-CM

## 2012-12-13 DIAGNOSIS — D509 Iron deficiency anemia, unspecified: Secondary | ICD-10-CM

## 2012-12-13 DIAGNOSIS — C50419 Malignant neoplasm of upper-outer quadrant of unspecified female breast: Secondary | ICD-10-CM

## 2012-12-13 DIAGNOSIS — Z5112 Encounter for antineoplastic immunotherapy: Secondary | ICD-10-CM

## 2012-12-13 DIAGNOSIS — C50412 Malignant neoplasm of upper-outer quadrant of left female breast: Secondary | ICD-10-CM

## 2012-12-13 DIAGNOSIS — D649 Anemia, unspecified: Secondary | ICD-10-CM

## 2012-12-13 DIAGNOSIS — C50912 Malignant neoplasm of unspecified site of left female breast: Secondary | ICD-10-CM

## 2012-12-13 LAB — COMPREHENSIVE METABOLIC PANEL (CC13)
Albumin: 3.3 g/dL — ABNORMAL LOW (ref 3.5–5.0)
BUN: 19.3 mg/dL (ref 7.0–26.0)
CO2: 25 mEq/L (ref 22–29)
Calcium: 8.8 mg/dL (ref 8.4–10.4)
Chloride: 109 mEq/L — ABNORMAL HIGH (ref 98–107)
Glucose: 92 mg/dl (ref 70–99)
Potassium: 3.7 mEq/L (ref 3.5–5.1)
Sodium: 142 mEq/L (ref 136–145)
Total Protein: 6.7 g/dL (ref 6.4–8.3)

## 2012-12-13 LAB — CBC WITH DIFFERENTIAL/PLATELET
Basophils Absolute: 0 10*3/uL (ref 0.0–0.1)
Eosinophils Absolute: 0.3 10*3/uL (ref 0.0–0.5)
HGB: 9.5 g/dL — ABNORMAL LOW (ref 11.6–15.9)
MCV: 75.2 fL — ABNORMAL LOW (ref 79.5–101.0)
MONO#: 0.3 10*3/uL (ref 0.1–0.9)
MONO%: 6 % (ref 0.0–14.0)
NEUT#: 3 10*3/uL (ref 1.5–6.5)
RBC: 4.16 10*6/uL (ref 3.70–5.45)
RDW: 18.3 % — ABNORMAL HIGH (ref 11.2–14.5)
WBC: 4.6 10*3/uL (ref 3.9–10.3)
lymph#: 1 10*3/uL (ref 0.9–3.3)

## 2012-12-13 MED ORDER — HEPARIN SOD (PORK) LOCK FLUSH 100 UNIT/ML IV SOLN
500.0000 [IU] | Freq: Once | INTRAVENOUS | Status: AC | PRN
  Administered 2012-12-13: 500 [IU]
  Filled 2012-12-13: qty 5

## 2012-12-13 MED ORDER — SODIUM CHLORIDE 0.9 % IV SOLN
Freq: Once | INTRAVENOUS | Status: AC
  Administered 2012-12-13: 11:00:00 via INTRAVENOUS

## 2012-12-13 MED ORDER — TRASTUZUMAB CHEMO INJECTION 440 MG
6.0000 mg/kg | Freq: Once | INTRAVENOUS | Status: AC
  Administered 2012-12-13: 462 mg via INTRAVENOUS
  Filled 2012-12-13: qty 22

## 2012-12-13 MED ORDER — SODIUM CHLORIDE 0.9 % IJ SOLN
10.0000 mL | INTRAMUSCULAR | Status: DC | PRN
  Administered 2012-12-13: 10 mL
  Filled 2012-12-13: qty 10

## 2012-12-13 MED ORDER — ACETAMINOPHEN 325 MG PO TABS
650.0000 mg | ORAL_TABLET | Freq: Once | ORAL | Status: AC
  Administered 2012-12-13: 650 mg via ORAL

## 2012-12-13 MED ORDER — DIPHENHYDRAMINE HCL 25 MG PO CAPS
50.0000 mg | ORAL_CAPSULE | Freq: Once | ORAL | Status: AC
  Administered 2012-12-13: 50 mg via ORAL

## 2012-12-13 NOTE — Patient Instructions (Signed)
Doing well.  Continue tamoxifen daily and proceed with Herceptin.  Please call us if you have any questions or concerns.     We will see you back on 01/02/13.

## 2012-12-13 NOTE — Progress Notes (Signed)
OFFICE PROGRESS NOTE  CC  [name], [name], MD [address] [place] [address] Dr. Emelia Loron Dr. 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 Originally diagnosed in September 2013.  PRIOR THERAPY:  #1Patient originally presented in September 2013 when she noticed dimpling within the left breast. She subsequently had a mammogram that showed suspicious findings. Further workup showed multiple areas of concern. There was noted to be a 2.8 cm spiculated mass within the upper outer quadrant of the left breast. The pathology did show invasive ductal carcinoma from the upper-outer quadrant of the left breast. There was also noted to be a 9 mm enhancing mass which was 2.2 cm anterior to the known area of malignancy. A third mass measuring 8 mm was 2.1 cm anterior to the known area of malignancy. Also a satellite spiculated mass was present 8 mm and a 4 mm rounded mass in the 12:00. She did have additional biopsies and was diagnosed With multicentric disease. There were also noted to be couple of small areas of concern in the right breast but these were negative for malignancy on biopsy.  #2 patient ultimately proceeded to undergo simple mastectomy on the left with sentinel lymph node biopsy on 03/21/2012. The final pathology within the left breast showed multifocal invasive and in situ ductal carcinoma. 4 distinct areas were seen measuring 3.2 cm, 2.3 cm, 1.3 cm, and 0.8 cm. All margins were negative. There was focal lymphatic vascular involvement and normal involvement by tumor. 2 sentinel nodes were positive for metastatic carcinoma with extracapsular extension. The tumors were ER positive PR positive and HER-2/neu positive.  #3 patient did undergo axillary lymph node dissection of the left axilla On 04/04/2012 additional 8 lymph nodes were removed. I have those 3 were positive therefore patient had 5/10 lymph nodes  positive for metastatic disease. With the final pathologic staging at T2 N2a.  #4 patient was begun on adjuvant systemic chemotherapy by Dr. Pierce Crane consisting of Taxotere carboplatinum given every 21 days with Herceptin given on a weekly basis for the duration of Taxotere and carbo platinum. Her chemotherapy began On 05/03/2012. She received a total of 6 cycles of this.  #5 after completion of chemotherapy she will received radiation therapy under the care of Dr. Dorothy Puffer from 10/08/12 through 11/21/12.  #6 She began q 3 week herceptin on 10/11/12.  Tamoxifen added on 11/22/12.   CURRENT THERAPY: every 3 week Herceptin and Tamoxifen  INTERVAL HISTORY: Jameah Rouser 42 y.o. female returns for Followup visit prior to her next herceptin dose.  She is feeling well today.  She continues to take the Tamoxifen daily and is tolerating it very well.  She takes the Effexor as well and it continues to help with the moodiness.  She denies fevers, chills, nausea, vomiting, constipation, diarrhea, numbness, she denies orthopnea, DOE, PND, leg swelling, she has not been exercising, and her strength is stable.  Her last echo was 10/04/12.     MEDICAL HISTORY: Past Medical History  Diagnosis Date  . Anemia   . Anxiety     does not like needles ..   . 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 Breast cancer 03/21/2012    left multifocal invasive, in situ ca,margins not involved,ER/PR=+,metastatic (1/1) node  . Allergy   . Complication of anesthesia     ALLERGIES:  is allergic to duricef.  MEDICATIONS:  Current Outpatient Prescriptions  Medication Sig Dispense Refill  . acetaminophen (TYLENOL) 325 MG tablet Take 650 mg by mouth every 6 (six) hours as needed.      . ALPRAZolam (XANAX) 0.25 MG tablet Take 1 tablet (0.25 mg total) by mouth at bedtime as needed for sleep.  10 tablet  0  . ibuprofen (ADVIL,MOTRIN) 200 MG tablet Take 200 mg by mouth every 6 (six) hours as needed.      . lidocaine-prilocaine (EMLA)  cream Apply topically as needed.  30 g  2  . LORazepam (ATIVAN) 1 MG tablet       . Multiple Vitamin (MULTIVITAMIN WITH MINERALS) TABS Take 1 tablet by mouth daily.      . tamoxifen (NOLVADEX) 20 MG tablet Take 1 tablet (20 mg total) by mouth daily.  30 tablet  6  . venlafaxine XR (EFFEXOR-XR) 37.5 MG 24 hr capsule Take 1 capsule (37.5 mg total) by mouth daily.  30 capsule  6   No current facility-administered medications for this visit.    SURGICAL HISTORY:  Past Surgical History  Procedure Laterality Date  . Leg surgery      LLE  . Eye surgery      as a child  . Mastectomy complete / simple w/ sentinel node biopsy  03/21/2012    left Dr.Matthew Dwain Sarna  . Breast biopsy  89/07/2011    left breast uoq bx=invasive ductal ca  . Breast biopsy bi lateral  02/15/12    left = invasive ductal, right breast= fibroadenoma,fibrocystic changes,no atypia,hyperlasia or malignancy identified  . Abdominal hysterectomy  2006    w/o oophorectomy  . Axillary lymph node dissection  04/04/2012    Procedure: AXILLARY LYMPH NODE DISSECTION;  Surgeon: Emelia Loron, MD;  Location: [facility];  Service: General;  Laterality: Left;  Left Axillary lymph node dissection, Port a cath placement  . Portacath placement  04/04/2012    Procedure: INSERTION PORT-A-CATH;  Surgeon: Emelia Loron, MD;  Location: [facility];  Service: General;  Laterality: Right;    REVIEW OF SYSTEMS:   General: fatigue (+), night sweats (-), fever (-), pain (-) Lymph: palpable nodes (-) HEENT: vision changes (-), mucositis (-), gum bleeding (-), epistaxis (-) Cardiovascular: chest pain (-), palpitations (-) Pulmonary: shortness of breath (-), dyspnea on exertion (-), cough (-), hemoptysis (-) GI:  Early satiety (-), melena (-), dysphagia (-), nausea/vomiting (-), diarrhea (-) GU: dysuria (-), hematuria (-), incontinence (-) Musculoskeletal: joint swelling (-), joint pain (-), back pain (-) Neuro:  weakness (-), numbness (-), headache (-), confusion (-) Skin: Rash (-), lesions (-), dryness (-) Psych: depression (-), suicidal/homicidal ideation (-), feeling of hopelessness (-)    PHYSICAL EXAMINATION: Blood pressure 112/78, pulse 69, temperature 97 F (36.1 C), temperature source Oral, resp. rate 20, height 5' (1.524 m), weight 166 lb 8 oz (75.524 kg). Body mass index is 32.52 kg/(m^2). General: Patient is a well appearing female in no acute distress HEENT: PERRLA, sclerae anicteric no conjunctival pallor, MMM Neck: supple, no palpable adenopathy Lungs: clear to auscultation bilaterally, no wheezes, rhonchi, or rales Cardiovascular: regular rate rhythm, S1, S2, no murmurs, rubs or gallops Abdomen: Soft, non-tender, non-distended, normoactive bowel sounds, no HSM Extremities: warm and well perfused, no clubbing, cyanosis, or edema Skin: No rashes or lesions Neuro: Non-focal Breasts: left breast mastectomy site erythematous, skin peeling under axilla, nontender Right breast no nodularity or masses. ECOG PERFORMANCE STATUS: 1 - Symptomatic but completely ambulatory   LABORATORY DATA: Lab Results  Component Value  Date   WBC 4.6 12/13/2012   HGB 9.5* 12/13/2012   HCT 31.3* 12/13/2012   MCV 75.2* 12/13/2012   PLT 210 12/13/2012      Chemistry      Component Value Date/Time   NA 142 11/22/2012 1052   NA 138 04/02/2012 1100   K 3.6 11/22/2012 1052   K 3.9 04/02/2012 1100   CL 108* 11/22/2012 1052   CL 103 04/02/2012 1100   CO2 25 11/22/2012 1052   CO2 25 04/02/2012 1100   BUN 13.9 11/22/2012 1052   BUN 16 04/02/2012 1100   CREATININE 0.7 11/22/2012 1052   CREATININE 0.49* 04/02/2012 1100      Component Value Date/Time   CALCIUM 8.8 11/22/2012 1052   CALCIUM 9.0 04/02/2012 1100   ALKPHOS 88 11/22/2012 1052   AST 21 11/22/2012 1052   ALT 28 11/22/2012 1052   BILITOT 0.63 11/22/2012 1052       RADIOGRAPHIC STUDIES:  No results found.  ASSESSMENT: 42 year old female with  #1 T2 N2  A. Invasive ductal carcinoma of the left breast that was multifocal she is status post mastectomy with axillary lymph node dissection in September October 2013. Patient's tumor was ER positive PR positive HER-2/neu positive.  #2 she completed adjuvant chemotherapy consisting of Taxotere carboplatinum and Herceptin total of 6 cycles of therapy as planned. She then started every three week herceptin, and received 6 weeks of radiation therapy.    #3 patient had last echocardiogram-4/4 echo- EF 50-55%, Dr. Gala Romney cleared her to continue Herceptin.   #4 patient is complaining of increasing weakness in her lower extremities. She does have underlying cerebral palsy. She sees physical therapy for this.  #5 Patient is ER PR positive, and will start on Tamoxifen.  Risks and benefits were explained to the patient.  She is s/p TAH, she will get her eye exams annually  PLAN:   #1 Proceed with every three week Herceptin.  Her last echo was 4/4 and she saw Dr. Gala Romney who cleared her to continue Herceptin therapy.  We will request another appointment with him today.    #2 She will continue Tamoxifen daily.  She is tolerating it well.    #3 Doing well with Effexor XR she will continue this.  #4 She is more anemic today, and her MCV is declining.  I will add on iron studies.    All questions were answered. The patient knows to call the clinic with any problems, questions or concerns. We can certainly see the patient much sooner if necessary.  I spent 25 minutes counseling the patient face to face. The total time spent in the appointment was 30 minutes.  Cherie Ouch Lyn Hollingshead, NP Medical Oncology [facility] Phone: [phone] 12/13/2012, 9:16 AM

## 2012-12-13 NOTE — Patient Instructions (Addendum)
[facility] Discharge Instructions for Patients Receiving Chemotherapy  Today you received the following chemotherapy agent: Herceptin.  To help prevent nausea and vomiting after your treatment, we encourage you to take your nausea medication.   If you develop nausea and vomiting that is not controlled by your nausea medication, call the clinic.   BELOW ARE SYMPTOMS THAT SHOULD BE REPORTED IMMEDIATELY:  *FEVER GREATER THAN 100.5 F  *CHILLS WITH OR WITHOUT FEVER  NAUSEA AND VOMITING THAT IS NOT CONTROLLED WITH YOUR NAUSEA MEDICATION  *UNUSUAL SHORTNESS OF BREATH  *UNUSUAL BRUISING OR BLEEDING  TENDERNESS IN MOUTH AND THROAT WITH OR WITHOUT PRESENCE OF ULCERS  *URINARY PROBLEMS  *BOWEL PROBLEMS  UNUSUAL RASH Items with * indicate a potential emergency and should be followed up as soon as possible.  Feel free to call the clinic you have any questions or concerns. The clinic phone number is [phone].    

## 2012-12-16 ENCOUNTER — Telehealth: Payer: Self-pay | Admitting: Oncology

## 2012-12-31 ENCOUNTER — Encounter (INDEPENDENT_AMBULATORY_CARE_PROVIDER_SITE_OTHER): Payer: Self-pay | Admitting: General Surgery

## 2012-12-31 ENCOUNTER — Other Ambulatory Visit: Payer: Self-pay | Admitting: Family Medicine

## 2012-12-31 ENCOUNTER — Ambulatory Visit (INDEPENDENT_AMBULATORY_CARE_PROVIDER_SITE_OTHER): Payer: PRIVATE HEALTH INSURANCE | Admitting: General Surgery

## 2012-12-31 VITALS — BP 130/80 | HR 92 | Resp 16 | Ht 60.0 in | Wt 168.4 lb

## 2012-12-31 DIAGNOSIS — C50412 Malignant neoplasm of upper-outer quadrant of left female breast: Secondary | ICD-10-CM

## 2012-12-31 DIAGNOSIS — C50419 Malignant neoplasm of upper-outer quadrant of unspecified female breast: Secondary | ICD-10-CM

## 2012-12-31 DIAGNOSIS — Z853 Personal history of malignant neoplasm of breast: Secondary | ICD-10-CM

## 2012-12-31 NOTE — Progress Notes (Signed)
Subjective:     Patient ID: Alicia Pena, female   DOB: [birth year], 42 y.o.   MRN: [id number]  HPI 22 yof who was underwent left mastectomy/eventual left alnd in 9/13 for T2N2 breast cancer.  She then underwent chemotherapy and currently is on q 3 week herceptin.  She has recently completed xrt and is now on tamoxifen.  She reports no real complaints. She is able to use left arm well.  She has hot flashes from tamoxifen that are fairly well controlled.  Review of Systems  Constitutional: Negative for fever, chills and unexpected weight change.  HENT: Negative for hearing loss, congestion, sore throat, trouble swallowing and voice change.   Eyes: Negative for visual disturbance.  Respiratory: Negative for cough and wheezing.   Cardiovascular: Negative for chest pain, palpitations and leg swelling.  Gastrointestinal: Negative for nausea, vomiting, abdominal pain, diarrhea, constipation, blood in stool, abdominal distention and anal bleeding.  Genitourinary: Negative for hematuria, vaginal bleeding and difficulty urinating.  Musculoskeletal: Negative for arthralgias.  Skin: Negative for rash and wound.  Neurological: Negative for seizures, syncope and headaches.  Hematological: Negative for adenopathy. Does not bruise/bleed easily.  Psychiatric/Behavioral: Negative for confusion.       Objective:   Physical Exam  Vitals reviewed. Constitutional: She appears well-developed and well-nourished.  Pulmonary/Chest: Right breast exhibits no inverted nipple, no mass, no nipple discharge, no skin change and no tenderness. Left breast exhibits skin change (c/w rad therapy). Left breast exhibits no mass.    Lymphadenopathy:    She has no cervical adenopathy.    She has no axillary adenopathy.       Right: No supraclavicular adenopathy present.       Left: No supraclavicular adenopathy present.       Assessment:     t2N2 breast cancer    Plan:     She has no clinical evidence of  recurrence. She will continue on her herceptin and tamoxifen. I told her that she will need a mammogram on the right side in August. We also discussed possible reconstruction at a later date. She is going to come back and see me in one year or sooner if needed.

## 2012-12-31 NOTE — Patient Instructions (Signed)
Breast Self-Examination You should begin examining your breasts at age 44 even though the risk for breast cancer is low in this age group. It is important to become familiar with how your breasts look and feel. This is true for pregnant women, nursing mothers, women in menopause and women who have breast implants.  Women should examine their breasts once a month to look for changes and lumps. By doing monthly breast exams, you get to know how your breasts feel and how they can change from month to month. This allows you to pick up changes early. It can also offer you some reassurance that your breast health is good. This exam only takes minutes. Most breast lumps are not caused by cancer. If you find a lump, a special x-ray called a mammogram, or other tests may be needed to determine what is wrong.  Some of the signs that a breast lump is caused by cancer include:  Dimpling of the skin or changes in the shape of the breast or nipple.   A dark-colored or bloody discharge from the nipple.   Swollen lymph glands around the breast or in the armpit.   Redness of the breast or nipple.   Scaly nipple or skin on the breast.   Pain or swelling of the breast.  SELF-EXAM There are a few points to follow when doing a thorough breast exam. The best time to examine your breasts is 5 to 7 days after the menstrual period is over. During menstruation, the breasts are lumpier, and it may be more difficult to pick up changes. If you do not menstruate, have reached menopause or had a hysterectomy, examine your breasts the first day of every month. After three to four months, you will become more familiar with the variations of your breasts and more comfortable with the exam.  Perform your breast exam monthly. Keep a written record with breast changes or normal findings for each breast. This makes it easier to be sure of changes and to not solely depend on memory for size, tenderness, or location. Try to do the exam  at the same time each month, and write down where you are in your menstrual cycle if you are still menstruating.   Look at your breasts. Stand in front of a mirror with your hands clasped behind your head. Tighten your chest muscles and look for asymmetry. This means a difference in shape or contour from one breast to the other, such as puckers, dips or bumps. Look also for skin changes.   Lean forward with your hands on your hips. Again, look for symmetry and skin changes.   While showering, soap the breasts, and carefully feel the breasts with fingertips while holding the arm (on the side of the breast being examined) over the head. Do this with each breast carefully feeling for lumps or changes. Typically, a circular motion with moderate fingertip pressure should be used.   Repeat this exam while lying on your back, again with your arm behind your head and a pillow under your shoulders. Again, use your fingertips to examine both breasts, feeling for lumps and thickening. Begin at 1 o'clock and go clockwise around the whole breast.   At the end of your exam, gently squeeze each nipple to see if there is any drainage. Look for nipple changes, dimpling or redness.   Lastly, examine the upper chest and clavicle areas and in your armpits.  It is not necessary to become alarmed if you find   a breast lump. Most of them are not cancerous. However, it is necessary to see your caregiver if a lump is found in order to have it looked at. Document Released: 07/27/2004 Document Revised: 03/01/2011 Document Reviewed: 10/06/2008 ExitCare Patient Information 2012 [employer]. 

## 2013-01-02 ENCOUNTER — Encounter: Payer: Self-pay | Admitting: Oncology

## 2013-01-02 ENCOUNTER — Ambulatory Visit (HOSPITAL_BASED_OUTPATIENT_CLINIC_OR_DEPARTMENT_OTHER): Payer: PRIVATE HEALTH INSURANCE

## 2013-01-02 ENCOUNTER — Other Ambulatory Visit (HOSPITAL_BASED_OUTPATIENT_CLINIC_OR_DEPARTMENT_OTHER): Payer: PRIVATE HEALTH INSURANCE | Admitting: Lab

## 2013-01-02 ENCOUNTER — Encounter: Payer: Self-pay | Admitting: Adult Health

## 2013-01-02 ENCOUNTER — Ambulatory Visit (HOSPITAL_BASED_OUTPATIENT_CLINIC_OR_DEPARTMENT_OTHER): Payer: PRIVATE HEALTH INSURANCE | Admitting: Adult Health

## 2013-01-02 VITALS — BP 129/82 | HR 83 | Temp 98.5°F | Resp 20 | Ht 60.0 in | Wt 168.8 lb

## 2013-01-02 DIAGNOSIS — C50412 Malignant neoplasm of upper-outer quadrant of left female breast: Secondary | ICD-10-CM

## 2013-01-02 DIAGNOSIS — R29898 Other symptoms and signs involving the musculoskeletal system: Secondary | ICD-10-CM

## 2013-01-02 DIAGNOSIS — C50912 Malignant neoplasm of unspecified site of left female breast: Secondary | ICD-10-CM

## 2013-01-02 DIAGNOSIS — C50419 Malignant neoplasm of upper-outer quadrant of unspecified female breast: Secondary | ICD-10-CM

## 2013-01-02 DIAGNOSIS — Z170 Estrogen receptor positive status [ER+]: Secondary | ICD-10-CM

## 2013-01-02 DIAGNOSIS — Z5112 Encounter for antineoplastic immunotherapy: Secondary | ICD-10-CM

## 2013-01-02 DIAGNOSIS — C50919 Malignant neoplasm of unspecified site of unspecified female breast: Secondary | ICD-10-CM

## 2013-01-02 LAB — CBC WITH DIFFERENTIAL/PLATELET
EOS%: 4.1 % (ref 0.0–7.0)
MCH: 22.5 pg — ABNORMAL LOW (ref 25.1–34.0)
MCHC: 30.1 g/dL — ABNORMAL LOW (ref 31.5–36.0)
MCV: 74.8 fL — ABNORMAL LOW (ref 79.5–101.0)
MONO%: 6.8 % (ref 0.0–14.0)
RBC: 4.4 10*6/uL (ref 3.70–5.45)
RDW: 18.1 % — ABNORMAL HIGH (ref 11.2–14.5)
nRBC: 0 % (ref 0–0)

## 2013-01-02 LAB — COMPREHENSIVE METABOLIC PANEL (CC13)
ALT: 21 U/L (ref 0–55)
Alkaline Phosphatase: 72 U/L (ref 40–150)
Sodium: 143 mEq/L (ref 136–145)
Total Bilirubin: 0.36 mg/dL (ref 0.20–1.20)
Total Protein: 6.6 g/dL (ref 6.4–8.3)

## 2013-01-02 MED ORDER — SODIUM CHLORIDE 0.9 % IV SOLN
Freq: Once | INTRAVENOUS | Status: AC
  Administered 2013-01-02: 12:00:00 via INTRAVENOUS

## 2013-01-02 MED ORDER — TRASTUZUMAB CHEMO INJECTION 440 MG
6.0000 mg/kg | Freq: Once | INTRAVENOUS | Status: AC
  Administered 2013-01-02: 462 mg via INTRAVENOUS
  Filled 2013-01-02: qty 22

## 2013-01-02 MED ORDER — DIPHENHYDRAMINE HCL 25 MG PO CAPS
50.0000 mg | ORAL_CAPSULE | Freq: Once | ORAL | Status: AC
  Administered 2013-01-02: 50 mg via ORAL

## 2013-01-02 MED ORDER — SODIUM CHLORIDE 0.9 % IJ SOLN
10.0000 mL | INTRAMUSCULAR | Status: DC | PRN
  Administered 2013-01-02: 10 mL
  Filled 2013-01-02: qty 10

## 2013-01-02 MED ORDER — HEPARIN SOD (PORK) LOCK FLUSH 100 UNIT/ML IV SOLN
500.0000 [IU] | Freq: Once | INTRAVENOUS | Status: AC | PRN
  Administered 2013-01-02: 500 [IU]
  Filled 2013-01-02: qty 5

## 2013-01-02 MED ORDER — ACETAMINOPHEN 325 MG PO TABS
650.0000 mg | ORAL_TABLET | Freq: Once | ORAL | Status: AC
Start: 2013-01-02 — End: 2013-01-02
  Administered 2013-01-02: 650 mg via ORAL

## 2013-01-02 NOTE — Patient Instructions (Addendum)
° °  Iron-Rich Diet ° °An iron-rich diet contains foods that are good sources of iron. Iron is an important mineral that helps your body produce hemoglobin. Hemoglobin is a protein in red blood cells that carries oxygen to the body's tissues. Sometimes, the iron level in your blood can be low. This may be caused by: °· A lack of iron in your diet. °· Blood loss. °· Times of growth, such as during pregnancy or during a child's growth and development. °Low levels of iron can cause a decrease in the number of red blood cells. This can result in iron deficiency anemia. Iron deficiency anemia symptoms include: °· Tiredness. °· Weakness. °· Irritability. °· Increased chance of infection. °Here are some recommendations for daily iron intake: °· Males older than 44 years of age need 8 mg of iron per day. °· Women ages 19 to 50 need 18 mg of iron per day. °· Pregnant women need 27 mg of iron per day, and women who are over 19 years of age and breastfeeding need 9 mg of iron per day. °· Women over the age of 50 need 8 mg of iron per day. °SOURCES OF IRON °There are 2 types of iron that are found in food: heme iron and nonheme iron. Heme iron is absorbed by the body better than nonheme iron. Heme iron is found in meat, poultry, and fish. Nonheme iron is found in grains, beans, and vegetables. °Heme Iron Sources °Food / Iron (mg) °· Chicken liver, 3 oz (85 g)/ 10 mg °· Beef liver, 3 oz (85 g)/ 5.5 mg °· Oysters, 3 oz (85 g)/ 8 mg °· Beef, 3 oz (85 g)/ 2 to 3 mg °· Shrimp, 3 oz (85 g)/ 2.8 mg °· Turkey, 3 oz (85 g)/ 2 mg °· Chicken, 3 oz (85 g) / 1 mg °· Fish (tuna, halibut), 3 oz (85 g)/ 1 mg °· Pork, 3 oz (85 g)/ 0.9 mg °Nonheme Iron Sources °Food / Iron (mg) °· Ready-to-eat breakfast cereal, iron-fortified / 3.9 to 7 mg °· Tofu, ½ cup / 3.4 mg °· Kidney beans, ½ cup / 2.6 mg °· Baked potato with skin / 2.7 mg °· Asparagus, ½ cup / 2.2 mg °· Avocado / 2 mg °· Dried peaches, ½ cup / 1.6 mg °· Raisins, ½ cup / 1.5 mg °· Soy milk,  1 cup / 1.5 mg °· Whole-wheat bread, 1 slice / 1.2 mg °· Spinach, 1 cup / 0.8 mg °· Broccoli, ½ cup / 0.6 mg °IRON ABSORPTION °Certain foods can decrease the body's absorption of iron. Try to avoid these foods and beverages while eating meals with iron-containing foods: °· Coffee. °· Tea. °· Fiber. °· Soy. °Foods containing vitamin C can help increase the amount of iron your body absorbs from iron sources, especially from nonheme sources. Eat foods with vitamin C along with iron-containing foods to increase your iron absorption. Foods that are high in vitamin C include many fruits and vegetables. Some good sources are: °· Fresh orange juice. °· Oranges. °· Strawberries. °· Mangoes. °· Grapefruit. °· Red bell peppers. °· Green bell peppers. °· Broccoli. °· Potatoes with skin. °· Tomato juice. °Document Released: 01/31/2005 Document Revised: 09/11/2011 Document Reviewed: 12/08/2010 °ExitCare® Patient Information ©2014 [employer]. ° °

## 2013-01-02 NOTE — Patient Instructions (Addendum)
[facility] Discharge Instructions for Patients Receiving Chemotherapy  Today you received the following chemotherapy agent Herceptin.  To help prevent nausea and vomiting after your treatment, we encourage you to take your nausea medication.   If you develop nausea and vomiting that is not controlled by your nausea medication, call the clinic.   BELOW ARE SYMPTOMS THAT SHOULD BE REPORTED IMMEDIATELY:  *FEVER GREATER THAN 100.5 F  *CHILLS WITH OR WITHOUT FEVER  NAUSEA AND VOMITING THAT IS NOT CONTROLLED WITH YOUR NAUSEA MEDICATION  *UNUSUAL SHORTNESS OF BREATH  *UNUSUAL BRUISING OR BLEEDING  TENDERNESS IN MOUTH AND THROAT WITH OR WITHOUT PRESENCE OF ULCERS  *URINARY PROBLEMS  *BOWEL PROBLEMS  UNUSUAL RASH Items with * indicate a potential emergency and should be followed up as soon as possible.  Feel free to call the clinic you have any questions or concerns. The clinic phone number is [phone].   Trastuzumab injection for infusion What is this medicine? TRASTUZUMAB (tras TOO zoo mab) is a monoclonal antibody. It targets a protein called HER2. This protein is found in some stomach and breast cancers. This medicine can stop cancer cell growth. This medicine may be used with other cancer treatments. This medicine may be used for other purposes; ask your health care provider or pharmacist if you have questions. What should I tell my health care provider before I take this medicine? They need to know if you have any of these conditions: -heart disease -heart failure -infection (especially a virus infection such as chickenpox, cold sores, or herpes) -lung or breathing disease, like asthma -recent or ongoing radiation therapy -an unusual or allergic reaction to trastuzumab, benzyl alcohol, or other medications, foods, dyes, or preservatives -pregnant or trying to get pregnant -breast-feeding How should I use this medicine? This drug is given as an  infusion into a vein. It is administered in a hospital or clinic by a specially trained health care professional. Talk to your pediatrician regarding the use of this medicine in children. This medicine is not approved for use in children. Overdosage: If you think you have taken too much of this medicine contact a poison control center or emergency room at once. NOTE: This medicine is only for you. Do not share this medicine with others. What if I miss a dose? It is important not to miss a dose. Call your doctor or health care professional if you are unable to keep an appointment. What may interact with this medicine? -cyclophosphamide -doxorubicin -warfarin This list may not describe all possible interactions. Give your health care provider a list of all the medicines, herbs, non-prescription drugs, or dietary supplements you use. Also tell them if you smoke, drink alcohol, or use illegal drugs. Some items may interact with your medicine. What should I watch for while using this medicine? Visit your doctor for checks on your progress. Report any side effects. Continue your course of treatment even though you feel ill unless your doctor tells you to stop. Call your doctor or health care professional for advice if you get a fever, chills or sore throat, or other symptoms of a cold or flu. Do not treat yourself. Try to avoid being around people who are sick. You may experience fever, chills and shaking during your first infusion. These effects are usually mild and can be treated with other medicines. Report any side effects during the infusion to your health care professional. Fever and chills usually do not happen with later infusions. What side effects may  I notice from receiving this medicine? Side effects that you should report to your doctor or other health care professional as soon as possible: -breathing difficulties -chest pain or palpitations -cough -dizziness or fainting -fever or chills,  sore throat -skin rash, itching or hives -swelling of the legs or ankles -unusually weak or tired Side effects that usually do not require medical attention (report to your doctor or other health care professional if they continue or are bothersome): -loss of appetite -headache -muscle aches -nausea This list may not describe all possible side effects. Call your doctor for medical advice about side effects. You may report side effects to FDA at 1-800-FDA-1088. Where should I keep my medicine? This drug is given in a hospital or clinic and will not be stored at home. NOTE: This sheet is a summary. It may not cover all possible information. If you have questions about this medicine, talk to your doctor, pharmacist, or health care provider.  2013, Elsevier/Gold Standard. (04/23/2009 1:43:15 PM)

## 2013-01-02 NOTE — Progress Notes (Signed)
OFFICE PROGRESS NOTE  CC  Alicia Pena, [name], MD [address] [place] [address] Dr. Emelia Loron Dr. 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 Originally diagnosed in September 2013.  PRIOR THERAPY:  #1Patient originally presented in September 2013 when she noticed dimpling within the left breast. She subsequently had a mammogram that showed suspicious findings. Further workup showed multiple areas of concern. There was noted to be a 2.8 cm spiculated mass within the upper outer quadrant of the left breast. The pathology did show invasive ductal carcinoma from the upper-outer quadrant of the left breast. There was also noted to be a 9 mm enhancing mass which was 2.2 cm anterior to the known area of malignancy. A third mass measuring 8 mm was 2.1 cm anterior to the known area of malignancy. Also a satellite spiculated mass was present 8 mm and a 4 mm rounded mass in the 12:00. She did have additional biopsies and was diagnosed With multicentric disease. There were also noted to be couple of small areas of concern in the right breast but these were negative for malignancy on biopsy.  #2 patient ultimately proceeded to undergo simple mastectomy on the left with sentinel lymph node biopsy on 03/21/2012. The final pathology within the left breast showed multifocal invasive and in situ ductal carcinoma. 4 distinct areas were seen measuring 3.2 cm, 2.3 cm, 1.3 cm, and 0.8 cm. All margins were negative. There was focal lymphatic vascular involvement and normal involvement by tumor. 2 sentinel nodes were positive for metastatic carcinoma with extracapsular extension. The tumors were ER positive PR positive and HER-2/neu positive.  #3 patient did undergo axillary lymph node dissection of the left axilla On 04/04/2012 additional 8 lymph nodes were removed. I have those 3 were positive therefore patient had 5/10 lymph nodes  positive for metastatic disease. With the final pathologic staging at T2 N2a.  #4 patient was begun on adjuvant systemic chemotherapy by Dr. Pierce Crane consisting of Taxotere carboplatinum given every 21 days with Herceptin given on a weekly basis for the duration of Taxotere and carbo platinum. Her chemotherapy began On 05/03/2012. She received a total of 6 cycles of this.  #5 after completion of chemotherapy she will received radiation therapy under the care of Dr. Dorothy Puffer from 10/08/12 through 11/21/12.  #6 She began q 3 week herceptin on 10/11/12.  Tamoxifen added on 11/22/12.   CURRENT THERAPY: every 3 week Herceptin and Tamoxifen  INTERVAL HISTORY: Alicia Pena 42 y.o. female returns for Followup visit prior to her next herceptin dose.  She is feeling well today.Her last appointment labs revealed that she is iron deficient.  She is feeling well otherwise, and continues to regain her strength.  She denies fevers, chills, nausea, vomiting, constipation, diarrhea, numbness, orthopnea, PND, DOE, cough or any further concerns.  A 10 point ROS is otherwise negative.     MEDICAL HISTORY: Past Medical History  Diagnosis Date  . Anemia   . Anxiety     does not like needles ..   . 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 Breast cancer 03/21/2012    left multifocal invasive, in situ ca,margins not involved,ER/PR=+,metastatic (1/1) node  . Allergy   . Complication of anesthesia     ALLERGIES:  is allergic to duricef.  MEDICATIONS:  Current Outpatient Prescriptions  Medication Sig Dispense Refill  . acetaminophen (TYLENOL) 325 MG tablet Take 650 mg by  mouth every 6 (six) hours as needed.      . ALPRAZolam (XANAX) 0.25 MG tablet Take 1 tablet (0.25 mg total) by mouth at bedtime as needed for sleep.  10 tablet  0  . ibuprofen (ADVIL,MOTRIN) 200 MG tablet Take 200 mg by mouth every 6 (six) hours as needed.      . lidocaine-prilocaine (EMLA) cream Apply topically as needed.  30 g  2  . LORazepam  (ATIVAN) 1 MG tablet       . Multiple Vitamin (MULTIVITAMIN WITH MINERALS) TABS Take 1 tablet by mouth daily.      . tamoxifen (NOLVADEX) 20 MG tablet Take 1 tablet (20 mg total) by mouth daily.  30 tablet  6  . venlafaxine XR (EFFEXOR-XR) 37.5 MG 24 hr capsule Take 1 capsule (37.5 mg total) by mouth daily.  30 capsule  6   No current facility-administered medications for this visit.    SURGICAL HISTORY:  Past Surgical History  Procedure Laterality Date  . Leg surgery      LLE  . Eye surgery      as a child  . Mastectomy complete / simple w/ sentinel node biopsy  03/21/2012    left Dr.Matthew Dwain Sarna  . Breast biopsy  89/07/2011    left breast uoq bx=invasive ductal ca  . Breast biopsy bi lateral  02/15/12    left = invasive ductal, right breast= fibroadenoma,fibrocystic changes,no atypia,hyperlasia or malignancy identified  . Abdominal hysterectomy  2006    w/o oophorectomy  . Axillary lymph node dissection  04/04/2012    Procedure: AXILLARY LYMPH NODE DISSECTION;  Surgeon: Emelia Loron, MD;  Location: [facility];  Service: General;  Laterality: Left;  Left Axillary lymph node dissection, Port a cath placement  . Portacath placement  04/04/2012    Procedure: INSERTION PORT-A-CATH;  Surgeon: Emelia Loron, MD;  Location: [facility];  Service: General;  Laterality: Right;    REVIEW OF SYSTEMS:   General: fatigue (+), night sweats (-), fever (-), pain (-) Lymph: palpable nodes (-) HEENT: vision changes (-), mucositis (-), gum bleeding (-), epistaxis (-) Cardiovascular: chest pain (-), palpitations (-) Pulmonary: shortness of breath (-), dyspnea on exertion (-), cough (-), hemoptysis (-) GI:  Early satiety (-), melena (-), dysphagia (-), nausea/vomiting (-), diarrhea (-) GU: dysuria (-), hematuria (-), incontinence (-) Musculoskeletal: joint swelling (-), joint pain (-), back pain (-) Neuro: weakness (-), numbness (-), headache (-), confusion  (-) Skin: Rash (-), lesions (-), dryness (-) Psych: depression (-), suicidal/homicidal ideation (-), feeling of hopelessness (-)    PHYSICAL EXAMINATION: Blood pressure 129/82, pulse 83, temperature 98.5 F (36.9 C), temperature source Oral, resp. rate 20, height 5' (1.524 m), weight 168 lb 12.8 oz (76.567 kg). Body mass index is 32.97 kg/(m^2). General: Patient is a well appearing female in no acute distress HEENT: PERRLA, sclerae anicteric no conjunctival pallor, MMM Neck: supple, no palpable adenopathy Lungs: clear to auscultation bilaterally, no wheezes, rhonchi, or rales Cardiovascular: regular rate rhythm, S1, S2, no murmurs, rubs or gallops Abdomen: Soft, non-tender, non-distended, normoactive bowel sounds, no HSM Extremities: warm and well perfused, no clubbing, cyanosis, or edema Skin: No rashes or lesions Neuro: Non-focal Breasts: left breast mastectomy site erythematous, skin peeling under axilla, nontender Right breast no nodularity or masses. ECOG PERFORMANCE STATUS: 1 - Symptomatic but completely ambulatory   LABORATORY DATA: Lab Results  Component Value Date   WBC 4.4 01/02/2013   HGB 9.9* 01/02/2013   HCT 32.9*  01/02/2013   MCV 74.8* 01/02/2013   PLT 226 01/02/2013      Chemistry      Component Value Date/Time   NA 143 01/02/2013 1052   NA 138 04/02/2012 1100   K 3.7 01/02/2013 1052   K 3.9 04/02/2012 1100   CL 109* 12/13/2012 0833   CL 103 04/02/2012 1100   CO2 26 01/02/2013 1052   CO2 25 04/02/2012 1100   BUN 18.9 01/02/2013 1052   BUN 16 04/02/2012 1100   CREATININE 0.7 01/02/2013 1052   CREATININE 0.49* 04/02/2012 1100      Component Value Date/Time   CALCIUM 8.7 01/02/2013 1052   CALCIUM 9.0 04/02/2012 1100   ALKPHOS 72 01/02/2013 1052   AST 17 01/02/2013 1052   ALT 21 01/02/2013 1052   BILITOT 0.36 01/02/2013 1052       RADIOGRAPHIC STUDIES:  No results found.  ASSESSMENT: 42 year old female with  #1 T2 N2 A. Invasive ductal carcinoma of the left breast that was  multifocal she is status post mastectomy with axillary lymph node dissection in September October 2013. Patient's tumor was ER positive PR positive HER-2/neu positive.  #2 she completed adjuvant chemotherapy consisting of Taxotere carboplatinum and Herceptin total of 6 cycles of therapy as planned. She then started every three week herceptin, and received 6 weeks of radiation therapy.    #3 patient had last echocardiogram-4/4 echo- EF 50-55%, Dr. Gala Romney cleared her to continue Herceptin.   #4 patient is complaining of increasing weakness in her lower extremities. She does have underlying cerebral palsy. She sees physical therapy for this.  #5 Patient is ER PR positive, and will start on Tamoxifen.  Risks and benefits were explained to the patient.  She is s/p TAH, she will get her eye exams annually  PLAN:   #1 Proceed with every three week Herceptin.  Her last echo was 4/4 and she saw Dr. Gala Romney who cleared her to continue Herceptin therapy. She will f/u with him again on 01/07/13.  #2 She will continue Tamoxifen daily.  She is tolerating it well.    #3 Doing well with Effexor XR she will continue this.  #4 She will also start Ferrous Sulfate PO BID.   All questions were answered. The patient knows to call the clinic with any problems, questions or concerns. We can certainly see the patient much sooner if necessary.  I spent 25 minutes counseling the patient face to face. The total time spent in the appointment was 30 minutes.  Cherie Ouch Lyn Hollingshead, NP Medical Oncology [facility] Phone: [phone] 01/03/2013, 8:47 AM

## 2013-01-02 NOTE — Progress Notes (Signed)
Put fmla form on nurse's desk °

## 2013-01-07 ENCOUNTER — Ambulatory Visit (HOSPITAL_COMMUNITY)
Admission: RE | Admit: 2013-01-07 | Discharge: 2013-01-07 | Disposition: A | Discharge status: Home / Self Care | Payer: PRIVATE HEALTH INSURANCE | Source: Ambulatory Visit | Attending: Family Medicine | Admitting: Family Medicine

## 2013-01-07 ENCOUNTER — Encounter: Payer: Self-pay | Admitting: Oncology

## 2013-01-07 ENCOUNTER — Encounter (HOSPITAL_COMMUNITY): Payer: Self-pay

## 2013-01-07 ENCOUNTER — Other Ambulatory Visit (HOSPITAL_COMMUNITY): Payer: Self-pay | Admitting: Internal Medicine

## 2013-01-07 ENCOUNTER — Ambulatory Visit (HOSPITAL_BASED_OUTPATIENT_CLINIC_OR_DEPARTMENT_OTHER)
Admission: RE | Admit: 2013-01-07 | Discharge: 2013-01-07 | Disposition: A | Discharge status: Home / Self Care | Payer: PRIVATE HEALTH INSURANCE | Source: Ambulatory Visit | Attending: Internal Medicine | Admitting: Internal Medicine

## 2013-01-07 VITALS — BP 100/80 | HR 75 | Wt 166.1 lb

## 2013-01-07 DIAGNOSIS — Z79899 Other long term (current) drug therapy: Secondary | ICD-10-CM | POA: Insufficient documentation

## 2013-01-07 DIAGNOSIS — C50412 Malignant neoplasm of upper-outer quadrant of left female breast: Secondary | ICD-10-CM

## 2013-01-07 DIAGNOSIS — I509 Heart failure, unspecified: Secondary | ICD-10-CM

## 2013-01-07 DIAGNOSIS — C50419 Malignant neoplasm of upper-outer quadrant of unspecified female breast: Secondary | ICD-10-CM

## 2013-01-07 DIAGNOSIS — Z170 Estrogen receptor positive status [ER+]: Secondary | ICD-10-CM | POA: Insufficient documentation

## 2013-01-07 DIAGNOSIS — Z09 Encounter for follow-up examination after completed treatment for conditions other than malignant neoplasm: Secondary | ICD-10-CM

## 2013-01-07 DIAGNOSIS — G809 Cerebral palsy, unspecified: Secondary | ICD-10-CM | POA: Insufficient documentation

## 2013-01-07 DIAGNOSIS — C50919 Malignant neoplasm of unspecified site of unspecified female breast: Secondary | ICD-10-CM | POA: Insufficient documentation

## 2013-01-07 DIAGNOSIS — R609 Edema, unspecified: Secondary | ICD-10-CM | POA: Insufficient documentation

## 2013-01-07 MED ORDER — CARVEDILOL 3.125 MG PO TABS: 3.1250 mg | ORAL_TABLET | Freq: Two times a day (BID) | ORAL | Status: DC

## 2013-01-07 MED ORDER — LOSARTAN POTASSIUM 25 MG PO TABS: 25.0000 mg | ORAL_TABLET | Freq: Every day | ORAL | Status: DC

## 2013-01-07 NOTE — Progress Notes (Signed)
*  PRELIMINARY RESULTS* Echocardiogram 2D Echocardiogram has been performed.  Alicia Pena 01/07/2013, 11:56 AM

## 2013-01-07 NOTE — Progress Notes (Signed)
Patient ID: Alicia Pena, female   DOB: 1971/04/03, 42 y.o.   MRN: [id number] Oncologist: Dr. 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   She was treated with taxotere and carboplatinum. Now on herceptin q 3 weeks. Began on 05/03/2012. Plan to continue Herceptin until 04/2013  ECHO 04/09/2012 EF 55-60% lateral S' ECHO 07/17/2012 EF 55-65% lateral S' 10.1 ECHO 10/04/12 EF 55% lateral S' 10.0 Global Strain -15 ECHO 01/07/13 EF ~40-45% Lateral S' 9.7 Global Stain -11  She returns for follow up. Denies SOB/PND/Orthopnea. Ambulates with a walker. Lives with Mom. Works full time in Clinical biochemist.    Past Medical History  Diagnosis Date  . Anemia   . Anxiety     does not like needles ..   . 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 Breast cancer 03/21/2012    left multifocal invasive, in situ ca,margins not involved,ER/PR=+,metastatic (1/1) node  . Allergy   . Complication of anesthesia     Current Outpatient Prescriptions  Medication Sig Dispense Refill  . acetaminophen (TYLENOL) 325 MG tablet Take 650 mg by mouth every 6 (six) hours as needed.      . ALPRAZolam (XANAX) 0.25 MG tablet Take 1 tablet (0.25 mg total) by mouth at bedtime as needed for sleep.  10 tablet  0  . ibuprofen (ADVIL,MOTRIN) 200 MG tablet Take 200 mg by mouth every 6 (six) hours as needed.      . lidocaine-prilocaine (EMLA) cream Apply topically as needed.  30 g  2  . LORazepam (ATIVAN) 1 MG tablet       . Multiple Vitamin (MULTIVITAMIN WITH MINERALS) TABS Take 1 tablet by mouth daily.      . tamoxifen (NOLVADEX) 20 MG tablet Take 1 tablet (20 mg total) by mouth daily.  30 tablet  6  . venlafaxine XR (EFFEXOR-XR) 37.5 MG 24 hr capsule Take 1 capsule (37.5 mg total) by mouth daily.  30 capsule  6   No current facility-administered medications  for this encounter.     Allergies  Allergen Reactions  . Duricef (Cefadroxil) Hives and Nausea Only    History   Social History  . Marital Status: Single    Spouse Name: N/A    Number of Children: N/A  . Years of Education: N/A   Occupational History  . Not on file.   Social History Main Topics  . Smoking status: Never Smoker   . Smokeless tobacco: Never Used  . Alcohol Use: No     Comment: used birth control pills 6 months,G)PO  . Drug Use: No  . Sexually Active: No   Other Topics Concern  . Not on file   Social History Narrative  . No narrative on file    Family History  Problem Relation Age of Onset  . Cancer Father 89    lung  . Diabetes Maternal Grandmother   . Hyperlipidemia Maternal Grandmother   . Hypertension Maternal Grandmother     PHYSICAL EXAM: Filed Vitals:   01/07/13 0949  BP: 100/80  Pulse: 75   General:  Well appearing. No respiratory difficulty HEENT: normal Neck: supple. no JVD. Carotids 2+ bilat; no bruits. No lymphadenopathy or thryomegaly appreciated. Cor: PMI nondisplaced. Regular rate & rhythm. No rubs, gallops or murmurs. Lungs: clear Abdomen: soft, nontender, nondistended. No  hepatosplenomegaly. No bruits or masses. Good bowel sounds. Extremities: no cyanosis, clubbing, rash, tr edema Neuro: alert & oriented x 3, cranial nerves grossly intact. moves all 4 extremities w/o difficulty. Affect pleasant.   ASSESSMENT & PLAN:

## 2013-01-07 NOTE — Progress Notes (Signed)
Faxed fmla form to [employer] @ [phone]. °

## 2013-01-07 NOTE — Patient Instructions (Addendum)
Follow up in 4 weeks  Take Carvedilol 3.125 mg twice a day  In one week start losartan 25 mg daily

## 2013-01-07 NOTE — Assessment & Plan Note (Addendum)
Dr Gala Romney discussed and reviewed ECHO. Reduced EF and global strain noted. Will need to stop Herceptin for now. Start carvedilol 3.125 mg twice a day today. In one week she will start losartan 25 mg daily. Follow up in 3 weeks. Will need repeat ECHO in a couple of months.   Patient seen and examined with Tonye Becket, NP. We discussed all aspects of the encounter. I agree with the assessment and plan as stated above. I reviewed echo personally and EF ad strain parameters are down mildly. This is asymptomatic. Will stop herceptin for now. Start carvedilol and losartan as described above. See back soon.

## 2013-01-15 ENCOUNTER — Ambulatory Visit: Payer: PRIVATE HEALTH INSURANCE | Admitting: Radiation Oncology

## 2013-01-21 ENCOUNTER — Encounter: Payer: Self-pay | Admitting: Oncology

## 2013-01-23 ENCOUNTER — Ambulatory Visit
Admission: RE | Admit: 2013-01-23 | Discharge: 2013-01-23 | Disposition: A | Discharge status: Home / Self Care | Payer: PRIVATE HEALTH INSURANCE | Source: Ambulatory Visit | Attending: Radiation Oncology | Admitting: Radiation Oncology

## 2013-01-23 VITALS — BP 134/82 | HR 82 | Temp 98.4°F | Ht 60.0 in | Wt 167.4 lb

## 2013-01-23 DIAGNOSIS — C50412 Malignant neoplasm of upper-outer quadrant of left female breast: Secondary | ICD-10-CM

## 2013-01-23 NOTE — Progress Notes (Addendum)
Alicia Pena here for follow up after treatment to Alicia Pena left chest.  She denies pain except for some numbness in Alicia Pena left upper arm.  She is fatigued.  The skin on Alicia Pena left chest has hyperpigmentation and some peeling.  She has finished using the radiaplex gel.  She is taking tamoxifen and has stopped taking the herceptin.

## 2013-01-23 NOTE — Progress Notes (Signed)
  Radiation Oncology         [phone]) [phone] ________________________________  Name: Alicia Pena MRN: [id number]  Date: 01/23/2013  DOB: [birth year]  Follow-Up Visit Note  CC: [name], [name], MD  [name], [name], MD  Diagnosis:   Invasive ductal carcinoma of the left breast  Interval Since Last Radiation:  2 months   Narrative:  The patient returns today for routine follow-up.  The patient states that she has done well. She is pleased with how her skin has healed. She continues to use some skin cream on the affected area. The patient has begun tamoxifen. She had some heart difficulties and she has been taken off Herceptin. She does remain somewhat fatigued.                              ALLERGIES:  is allergic to duricef.  Meds: Current Outpatient Prescriptions  Medication Sig Dispense Refill  . acetaminophen (TYLENOL) 325 MG tablet Take 650 mg by mouth every 6 (six) hours as needed.      . ALPRAZolam (XANAX) 0.25 MG tablet Take 1 tablet (0.25 mg total) by mouth at bedtime as needed for sleep.  10 tablet  0  . carvedilol (COREG) 3.125 MG tablet Take 1 tablet (3.125 mg total) by mouth 2 (two) times daily with a meal.  60 tablet  3  . ibuprofen (ADVIL,MOTRIN) 200 MG tablet Take 200 mg by mouth every 6 (six) hours as needed.      . lidocaine-prilocaine (EMLA) cream Apply topically as needed.  30 g  2  . LORazepam (ATIVAN) 1 MG tablet       . losartan (COZAAR) 25 MG tablet Take 1 tablet (25 mg total) by mouth daily.  30 tablet  3  . Multiple Vitamin (MULTIVITAMIN WITH MINERALS) TABS Take 1 tablet by mouth daily.      . tamoxifen (NOLVADEX) 20 MG tablet Take 1 tablet (20 mg total) by mouth daily.  30 tablet  6  . venlafaxine XR (EFFEXOR-XR) 37.5 MG 24 hr capsule Take 1 capsule (37.5 mg total) by mouth daily.  30 capsule  6   No current facility-administered medications for this encounter.    Physical Findings: The patient is in no acute distress. Patient is alert and oriented.  height is 5'  (1.524 m) and weight is 167 lb 6.4 oz (75.932 kg). Her temperature is 98.4 F (36.9 C). Her blood pressure is 134/82 and her pulse is 82. .   Some dryness of skin/hyperpigmentation present in the treatment area. Overall her skin looks good with no moist desquamation/ongoing erythema.  Lab Findings: Lab Results  Component Value Date   WBC 4.4 01/02/2013   HGB 9.9* 01/02/2013   HCT 32.9* 01/02/2013   MCV 74.8* 01/02/2013   PLT 226 01/02/2013     Radiographic Findings: No results found.  Impression:    The patient is doing satisfactorily months after finishing her course of radiotherapy to the left chest wall region. Her skin is healing satisfactorily as well and we discussed continuing to use some moisturizing cream as long as the dryness process. She has begun tamoxifen.  Plan:  The patient will followup in our clinic on a when necessary basis.   Alicia Pena, M.D., Ph.D.

## 2013-01-24 ENCOUNTER — Telehealth: Payer: Self-pay | Admitting: *Deleted

## 2013-01-24 ENCOUNTER — Telehealth (HOSPITAL_COMMUNITY): Payer: Self-pay | Admitting: Adult Health

## 2013-01-24 ENCOUNTER — Encounter: Payer: Self-pay | Admitting: Oncology

## 2013-01-24 ENCOUNTER — Ambulatory Visit: Payer: PRIVATE HEALTH INSURANCE

## 2013-01-24 ENCOUNTER — Other Ambulatory Visit (HOSPITAL_BASED_OUTPATIENT_CLINIC_OR_DEPARTMENT_OTHER): Payer: PRIVATE HEALTH INSURANCE | Admitting: Lab

## 2013-01-24 ENCOUNTER — Ambulatory Visit (HOSPITAL_BASED_OUTPATIENT_CLINIC_OR_DEPARTMENT_OTHER): Payer: PRIVATE HEALTH INSURANCE | Admitting: Oncology

## 2013-01-24 VITALS — BP 145/87 | HR 90 | Temp 98.0°F | Resp 20 | Ht 60.0 in | Wt 170.1 lb

## 2013-01-24 DIAGNOSIS — C50412 Malignant neoplasm of upper-outer quadrant of left female breast: Secondary | ICD-10-CM

## 2013-01-24 DIAGNOSIS — C50419 Malignant neoplasm of upper-outer quadrant of unspecified female breast: Secondary | ICD-10-CM

## 2013-01-24 LAB — CBC WITH DIFFERENTIAL/PLATELET
BASO%: 0.6 % (ref 0.0–2.0)
HCT: 33 % — ABNORMAL LOW (ref 34.8–46.6)
LYMPH%: 25 % (ref 14.0–49.7)
MCH: 22.3 pg — ABNORMAL LOW (ref 25.1–34.0)
MCHC: 30.3 g/dL — ABNORMAL LOW (ref 31.5–36.0)
MCV: 73.7 fL — ABNORMAL LOW (ref 79.5–101.0)
MONO#: 0.4 10*3/uL (ref 0.1–0.9)
NEUT%: 62.9 % (ref 38.4–76.8)
Platelets: 219 10*3/uL (ref 145–400)
WBC: 5.3 10*3/uL (ref 3.9–10.3)

## 2013-01-24 NOTE — Telephone Encounter (Signed)
Received phone call from [place] regarding small amount of bleeding noted from rectum  She was concerned it was from carvediol and losartan. Reassured bleeding was not from losartan or carvedilol.   I instructed her to contact Dr Welton Flakes regarding bleeding today.   Dylin Breeden NP-C 2:21 PM

## 2013-01-24 NOTE — Telephone Encounter (Signed)
Per staff message and POF I have scheduled appts.  JMW  

## 2013-01-24 NOTE — Telephone Encounter (Signed)
appts made and printed. Pt is aware that i emailed MW to add her tx. However pt is not in agreement with having those tx due to she stated that Dr. Gala Romney told her to hold off on tx due to her heart. i emailed KK to make her aware of this...td

## 2013-01-24 NOTE — Patient Instructions (Addendum)
Hold herceptin until heart function improves  We will see you back on 8/15

## 2013-02-03 ENCOUNTER — Ambulatory Visit (HOSPITAL_COMMUNITY)
Admission: RE | Admit: 2013-02-03 | Discharge: 2013-02-03 | Disposition: A | Discharge status: Home / Self Care | Payer: PRIVATE HEALTH INSURANCE | Source: Ambulatory Visit | Attending: Cardiology | Admitting: Cardiology

## 2013-02-03 ENCOUNTER — Encounter (HOSPITAL_COMMUNITY): Payer: Self-pay

## 2013-02-03 DIAGNOSIS — Z853 Personal history of malignant neoplasm of breast: Secondary | ICD-10-CM | POA: Insufficient documentation

## 2013-02-03 DIAGNOSIS — I427 Cardiomyopathy due to drug and external agent: Secondary | ICD-10-CM | POA: Insufficient documentation

## 2013-02-03 DIAGNOSIS — I429 Cardiomyopathy, unspecified: Secondary | ICD-10-CM | POA: Insufficient documentation

## 2013-02-03 DIAGNOSIS — T50905S Adverse effect of unspecified drugs, medicaments and biological substances, sequela: Secondary | ICD-10-CM

## 2013-02-03 DIAGNOSIS — T451X5A Adverse effect of antineoplastic and immunosuppressive drugs, initial encounter: Secondary | ICD-10-CM | POA: Insufficient documentation

## 2013-02-03 DIAGNOSIS — F411 Generalized anxiety disorder: Secondary | ICD-10-CM | POA: Insufficient documentation

## 2013-02-03 DIAGNOSIS — Z9010 Acquired absence of unspecified breast and nipple: Secondary | ICD-10-CM | POA: Insufficient documentation

## 2013-02-03 DIAGNOSIS — Z79899 Other long term (current) drug therapy: Secondary | ICD-10-CM | POA: Insufficient documentation

## 2013-02-03 DIAGNOSIS — Z923 Personal history of irradiation: Secondary | ICD-10-CM | POA: Insufficient documentation

## 2013-02-03 DIAGNOSIS — G809 Cerebral palsy, unspecified: Secondary | ICD-10-CM | POA: Insufficient documentation

## 2013-02-03 HISTORY — DX: Adverse effect of antineoplastic and immunosuppressive drugs, initial encounter: I42.7

## 2013-02-03 MED ORDER — BISOPROLOL FUMARATE 10 MG PO TABS: 5.0000 mg | ORAL_TABLET | Freq: Every day | ORAL | Status: DC

## 2013-02-03 NOTE — Patient Instructions (Addendum)
Start taking bisoprolol 5 mg at bedtime.  Follow up 1 month with ECHO.

## 2013-02-03 NOTE — Progress Notes (Signed)
Patient ID: Alicia Pena, female   DOB: 16-Jun-1971, 42 y.o.   MRN: [id number]  Oncologist: Dr. 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   She was treated with taxotere and carboplatinum. Now on herceptin q 3 weeks. Began on 05/03/2012. Plan to continue Herceptin until 04/2013  ECHO 04/09/2012 EF 55-60% lateral S' ECHO 07/17/2012 EF 55-65% lateral S' 10.1 ECHO 10/04/12 EF 55% lateral S' 10.0 Global Strain -15 ECHO 01/07/13 EF ~45-50% Lateral S' 9.7 Global Stain -11  At last visit (7/14), EF & Doppler parameters down slightly. Herceptin put on hold. Carvedilol and losartan started in staged fashion. Returns today for f/u. She returns for follow up.Feels pretty good. Working every day.  Denies SOB/PND/Orthopnea. Ambulates with a walker due to CP. Tolerating the medicines but having a hard time taking the carvedilol twice a day because it makes her sleepy if she takes before work. So she takes first dose at 5p and 2nd dose at 9p.    Past Medical History  Diagnosis Date  . Anemia   . Anxiety     does not like needles ..   . 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 Breast cancer 03/21/2012    left multifocal invasive, in situ ca,margins not involved,ER/PR=+,metastatic (1/1) node  . Allergy   . Complication of anesthesia   . History of radiation therapy 10/08/2012-11/21/2012    50.4 gray to left chest wall and supraclavicular region    Current Outpatient Prescriptions  Medication Sig Dispense Refill  . acetaminophen (TYLENOL) 325 MG tablet Take 650 mg by mouth every 6 (six) hours as needed.      . ALPRAZolam (XANAX) 0.25 MG tablet Take 1 tablet (0.25 mg total) by mouth at bedtime as needed for sleep.  10 tablet  0  . carvedilol (COREG) 3.125 MG tablet Take 1 tablet (3.125 mg total) by mouth 2 (two) times daily with a  meal.  60 tablet  3  . ibuprofen (ADVIL,MOTRIN) 200 MG tablet Take 200 mg by mouth every 6 (six) hours as needed.      . lidocaine-prilocaine (EMLA) cream Apply topically as needed.  30 g  2  . LORazepam (ATIVAN) 1 MG tablet       . losartan (COZAAR) 25 MG tablet Take 1 tablet (25 mg total) by mouth daily.  30 tablet  3  . Multiple Vitamin (MULTIVITAMIN WITH MINERALS) TABS Take 1 tablet by mouth daily.      . tamoxifen (NOLVADEX) 20 MG tablet Take 1 tablet (20 mg total) by mouth daily.  30 tablet  6  . venlafaxine XR (EFFEXOR-XR) 37.5 MG 24 hr capsule Take 1 capsule (37.5 mg total) by mouth daily.  30 capsule  6   No current facility-administered medications for this encounter.     Allergies  Allergen Reactions  . Duricef (Cefadroxil) Hives and Nausea Only    History   Social History  . Marital Status: Single    Spouse Name: N/A    Number of Children: N/A  . Years of Education: N/A   Occupational History  . Not on file.   Social History Main Topics  . Smoking status: Never Smoker   . Smokeless tobacco: Never Used  . Alcohol Use: No     Comment: used birth control pills 6 months,G)PO  .  Drug Use: No  . Sexually Active: No   Other Topics Concern  . Not on file   Social History Narrative  . No narrative on file    Family History  Problem Relation Age of Onset  . Cancer Father 69    lung  . Diabetes Maternal Grandmother   . Hyperlipidemia Maternal Grandmother   . Hypertension Maternal Grandmother     PHYSICAL EXAM: Filed Vitals:   02/03/13 1007  BP: 136/92  Pulse: 76  Weight: 169 lb (76.658 kg)  SpO2: 96%   General:  Well appearing. No respiratory difficulty HEENT: normal Neck: supple. no JVD. Carotids 2+ bilat; no bruits. No lymphadenopathy or thryomegaly appreciated. Cor: PMI nondisplaced. Regular rate & rhythm. No rubs, gallops or murmurs. Lungs: clear Abdomen: soft, nontender, nondistended. No hepatosplenomegaly. No bruits or masses. Good bowel  sounds. Extremities: no cyanosis, clubbing, rash, no edema. Walks with walker due to CP Neuro: alert & oriented x 3, cranial nerves grossly intact. moves all 4 extremities w/o difficulty. Affect pleasant.   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 Breast CA, as above 3. Cerebral palsy  PLAN:  Doing well from cardiac standpoint. She is totally asymptomatic. Given difficulty with bid dosing with carvedilol will switch to bisoprolol 5 qd. Continue Losartan. Will get f/u echo in 1 month and hopefully we can get her back on Herceptin - she only has 5 treatments left.   Alicia Finks,MD 10:27 AM

## 2013-02-09 NOTE — Progress Notes (Signed)
OFFICE PROGRESS NOTE  CC  [name], [name], MD [address] [place] [address] Dr. Emelia Loron Dr. 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 Originally diagnosed in September 2013.  PRIOR THERAPY:  #1Patient originally presented in September 2013 when she noticed dimpling within the left breast. She subsequently had a mammogram that showed suspicious findings. Further workup showed multiple areas of concern. There was noted to be a 2.8 cm spiculated mass within the upper outer quadrant of the left breast. The pathology did show invasive ductal carcinoma from the upper-outer quadrant of the left breast. There was also noted to be a 9 mm enhancing mass which was 2.2 cm anterior to the known area of malignancy. A third mass measuring 8 mm was 2.1 cm anterior to the known area of malignancy. Also a satellite spiculated mass was present 8 mm and a 4 mm rounded mass in the 12:00. She did have additional biopsies and was diagnosed With multicentric disease. There were also noted to be couple of small areas of concern in the right breast but these were negative for malignancy on biopsy.  #2 patient ultimately proceeded to undergo simple mastectomy on the left with sentinel lymph node biopsy on 03/21/2012. The final pathology within the left breast showed multifocal invasive and in situ ductal carcinoma. 4 distinct areas were seen measuring 3.2 cm, 2.3 cm, 1.3 cm, and 0.8 cm. All margins were negative. There was focal lymphatic vascular involvement and normal involvement by tumor. 2 sentinel nodes were positive for metastatic carcinoma with extracapsular extension. The tumors were ER positive PR positive and HER-2/neu positive.  #3 patient did undergo axillary lymph node dissection of the left axilla On 04/04/2012 additional 8 lymph nodes were removed. I have those 3 were positive therefore patient had 5/10 lymph nodes  positive for metastatic disease. With the final pathologic staging at T2 N2a.  #4 patient was begun on adjuvant systemic chemotherapy by Dr. Pierce Crane consisting of Taxotere carboplatinum given every 21 days with Herceptin given on a weekly basis for the duration of Taxotere and carbo platinum. Her chemotherapy began On 05/03/2012. She received a total of 6 cycles of this.  #5 after completion of chemotherapy she will received radiation therapy under the care of Dr. Dorothy Puffer from 10/08/12 through 11/21/12.  #6 She began q 3 week herceptin on 10/11/12.  Tamoxifen added on 11/22/12.   CURRENT THERAPY: every 3 week Herceptin and Tamoxifen  INTERVAL HISTORY: Alicia Pena 42 y.o. female returns for Followup visit prior to her next herceptin dose.  She is feeling well today..  She is feeling well otherwise, and continues to regain her strength.  She denies fevers, chills, nausea, vomiting, constipation, diarrhea, numbness, orthopnea, PND, DOE, cough or any further concerns.  A 10 point ROS is otherwise negative.     MEDICAL HISTORY: Past Medical History  Diagnosis Date  . Anemia   . Anxiety     does not like needles ..   . 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 Breast cancer 03/21/2012    left multifocal invasive, in situ ca,margins not involved,ER/PR=+,metastatic (1/1) node  . Allergy   . Complication of anesthesia   . History of radiation therapy 10/08/2012-11/21/2012    50.4 gray to left chest wall and supraclavicular region    ALLERGIES:  is allergic to duricef.  MEDICATIONS:  Current Outpatient Prescriptions  Medication Sig Dispense Refill  .  acetaminophen (TYLENOL) 325 MG tablet Take 650 mg by mouth every 6 (six) hours as needed.      . ALPRAZolam (XANAX) 0.25 MG tablet Take 1 tablet (0.25 mg total) by mouth at bedtime as needed for sleep.  10 tablet  0  . ibuprofen (ADVIL,MOTRIN) 200 MG tablet Take 200 mg by mouth every 6 (six) hours as needed.      . lidocaine-prilocaine (EMLA) cream  Apply topically as needed.  30 g  2  . LORazepam (ATIVAN) 1 MG tablet       . losartan (COZAAR) 25 MG tablet Take 1 tablet (25 mg total) by mouth daily.  30 tablet  3  . Multiple Vitamin (MULTIVITAMIN WITH MINERALS) TABS Take 1 tablet by mouth daily.      . tamoxifen (NOLVADEX) 20 MG tablet Take 1 tablet (20 mg total) by mouth daily.  30 tablet  6  . venlafaxine XR (EFFEXOR-XR) 37.5 MG 24 hr capsule Take 1 capsule (37.5 mg total) by mouth daily.  30 capsule  6  . bisoprolol (ZEBETA) 10 MG tablet Take 0.5 tablets (5 mg total) by mouth daily.  30 tablet  3   No current facility-administered medications for this visit.    SURGICAL HISTORY:  Past Surgical History  Procedure Laterality Date  . Leg surgery      LLE  . Eye surgery      as a child  . Mastectomy complete / simple w/ sentinel node biopsy  03/21/2012    left Dr.Matthew Dwain Sarna  . Breast biopsy  89/07/2011    left breast uoq bx=invasive ductal ca  . Breast biopsy bi lateral  02/15/12    left = invasive ductal, right breast= fibroadenoma,fibrocystic changes,no atypia,hyperlasia or malignancy identified  . Abdominal hysterectomy  2006    w/o oophorectomy  . Axillary lymph node dissection  04/04/2012    Procedure: AXILLARY LYMPH NODE DISSECTION;  Surgeon: Emelia Loron, MD;  Location: [facility];  Service: General;  Laterality: Left;  Left Axillary lymph node dissection, Port a cath placement  . Portacath placement  04/04/2012    Procedure: INSERTION PORT-A-CATH;  Surgeon: Emelia Loron, MD;  Location: [facility];  Service: General;  Laterality: Right;    REVIEW OF SYSTEMS:   General: fatigue (+), night sweats (-), fever (-), pain (-) Lymph: palpable nodes (-) HEENT: vision changes (-), mucositis (-), gum bleeding (-), epistaxis (-) Cardiovascular: chest pain (-), palpitations (-) Pulmonary: shortness of breath (-), dyspnea on exertion (-), cough (-), hemoptysis (-) GI:  Early satiety (-),  melena (-), dysphagia (-), nausea/vomiting (-), diarrhea (-) GU: dysuria (-), hematuria (-), incontinence (-) Musculoskeletal: joint swelling (-), joint pain (-), back pain (-) Neuro: weakness (-), numbness (-), headache (-), confusion (-) Skin: Rash (-), lesions (-), dryness (-) Psych: depression (-), suicidal/homicidal ideation (-), feeling of hopelessness (-)    PHYSICAL EXAMINATION: Blood pressure 145/87, pulse 90, temperature 98 F (36.7 C), temperature source Oral, resp. rate 20, height 5' (1.524 m), weight 170 lb 1.6 oz (77.157 kg). Body mass index is 33.22 kg/(m^2). General: Patient is a well appearing female in no acute distress HEENT: PERRLA, sclerae anicteric no conjunctival pallor, MMM Neck: supple, no palpable adenopathy Lungs: clear to auscultation bilaterally, no wheezes, rhonchi, or rales Cardiovascular: regular rate rhythm, S1, S2, no murmurs, rubs or gallops Abdomen: Soft, non-tender, non-distended, normoactive bowel sounds, no HSM Extremities: warm and well perfused, no clubbing, cyanosis, or edema Skin: No rashes or lesions Neuro:  Non-focal Breasts: left breast mastectomy site erythematous, skin peeling under axilla, nontender Right breast no nodularity or masses. ECOG PERFORMANCE STATUS: 1 - Symptomatic but completely ambulatory   LABORATORY DATA: Lab Results  Component Value Date   WBC 5.3 01/24/2013   HGB 10.0* 01/24/2013   HCT 33.0* 01/24/2013   MCV 73.7* 01/24/2013   PLT 219 01/24/2013      Chemistry      Component Value Date/Time   NA 143 01/02/2013 1052   NA 138 04/02/2012 1100   K 3.7 01/02/2013 1052   K 3.9 04/02/2012 1100   CL 109* 12/13/2012 0833   CL 103 04/02/2012 1100   CO2 26 01/02/2013 1052   CO2 25 04/02/2012 1100   BUN 18.9 01/02/2013 1052   BUN 16 04/02/2012 1100   CREATININE 0.7 01/02/2013 1052   CREATININE 0.49* 04/02/2012 1100      Component Value Date/Time   CALCIUM 8.7 01/02/2013 1052   CALCIUM 9.0 04/02/2012 1100   ALKPHOS 72 01/02/2013 1052    AST 17 01/02/2013 1052   ALT 21 01/02/2013 1052   BILITOT 0.36 01/02/2013 1052       RADIOGRAPHIC STUDIES:  No results found.  ASSESSMENT: 42 year old female with  #1 T2 N2 A. Invasive ductal carcinoma of the left breast that was multifocal she is status post mastectomy with axillary lymph node dissection in September October 2013. Patient's tumor was ER positive PR positive HER-2/neu positive.  #2 she completed adjuvant chemotherapy consisting of Taxotere carboplatinum and Herceptin total of 6 cycles of therapy as planned. She then started every three week herceptin, and received 6 weeks of radiation therapy.    #3 patient had last echocardiogram-4/4 echo- EF 50-55%, Dr. Gala Romney cleared her to continue Herceptin.   #4 patient is complaining of increasing weakness in her lower extremities. She does have underlying cerebral palsy. She sees physical therapy for this.  #5 Patient is ER PR positive, and will start on Tamoxifen.  Risks and benefits were explained to the patient.  She is s/p TAH, she will get her eye exams annually  PLAN:   #1 Proceed with every three week Herceptin.   #2 She will continue Tamoxifen daily.  She is tolerating it well.    #3 Doing well with Effexor XR she will continue this.  #4 She will also start Ferrous Sulfate PO BID.   All questions were answered. The patient knows to call the clinic with any problems, questions or concerns. We can certainly see the patient much sooner if necessary.  I spent 25 minutes counseling the patient face to face. The total time spent in the appointment was 30 minutes.   Drue Second, MD Medical/Oncology [facility] [phone] (beeper) [phone] (Office)

## 2013-02-14 ENCOUNTER — Encounter: Payer: Self-pay | Admitting: Adult Health

## 2013-02-14 ENCOUNTER — Ambulatory Visit: Payer: PRIVATE HEALTH INSURANCE

## 2013-02-14 ENCOUNTER — Ambulatory Visit (HOSPITAL_BASED_OUTPATIENT_CLINIC_OR_DEPARTMENT_OTHER): Payer: PRIVATE HEALTH INSURANCE | Admitting: Adult Health

## 2013-02-14 ENCOUNTER — Other Ambulatory Visit (HOSPITAL_BASED_OUTPATIENT_CLINIC_OR_DEPARTMENT_OTHER): Payer: PRIVATE HEALTH INSURANCE | Admitting: Lab

## 2013-02-14 VITALS — BP 113/67 | HR 58 | Temp 98.6°F | Resp 20 | Ht 60.0 in | Wt 167.9 lb

## 2013-02-14 DIAGNOSIS — C50412 Malignant neoplasm of upper-outer quadrant of left female breast: Secondary | ICD-10-CM

## 2013-02-14 DIAGNOSIS — C50419 Malignant neoplasm of upper-outer quadrant of unspecified female breast: Secondary | ICD-10-CM

## 2013-02-14 LAB — CBC WITH DIFFERENTIAL/PLATELET
Basophils Absolute: 0 10*3/uL (ref 0.0–0.1)
Eosinophils Absolute: 0.2 10*3/uL (ref 0.0–0.5)
HGB: 10 g/dL — ABNORMAL LOW (ref 11.6–15.9)
MCV: 73.4 fL — ABNORMAL LOW (ref 79.5–101.0)
MONO#: 0.3 10*3/uL (ref 0.1–0.9)
MONO%: 7.6 % (ref 0.0–14.0)
NEUT#: 2.6 10*3/uL (ref 1.5–6.5)
RDW: 18.7 % — ABNORMAL HIGH (ref 11.2–14.5)
WBC: 4.3 10*3/uL (ref 3.9–10.3)

## 2013-02-14 LAB — COMPREHENSIVE METABOLIC PANEL (CC13)
Albumin: 3 g/dL — ABNORMAL LOW (ref 3.5–5.0)
Alkaline Phosphatase: 68 U/L (ref 40–150)
BUN: 18.4 mg/dL (ref 7.0–26.0)
CO2: 21 mEq/L — ABNORMAL LOW (ref 22–29)
Calcium: 8.5 mg/dL (ref 8.4–10.4)
Chloride: 113 mEq/L — ABNORMAL HIGH (ref 98–109)
Glucose: 112 mg/dl (ref 70–140)
Potassium: 4 mEq/L (ref 3.5–5.1)
Sodium: 145 mEq/L (ref 136–145)
Total Protein: 6.4 g/dL (ref 6.4–8.3)

## 2013-02-14 NOTE — Patient Instructions (Addendum)
Iron Deficiency Anemia There are many types of anemia. Iron deficiency anemia is the most common. Iron deficiency anemia is a decrease in the number of red blood cells caused by too little iron. Without enough iron, your body does not produce enough hemoglobin. Hemoglobin is a substance in red blood cells that carries oxygen to the body's tissues. Iron deficiency anemia may leave you tired and short of breath. CAUSES   Lack of iron in the diet.  This may be seen in infants and children, because there is little iron in milk.  This may be seen in adults who do not eat enough iron-rich foods.  This may be seen in pregnant or breastfeeding women who do not take iron supplements. There is a much higher need for iron intake at these times.  Poor absorption of iron, as seen with intestinal disorders.  Intestinal bleeding.  Heavy periods. SYMPTOMS  Mild anemia may not be noticeable. Symptoms may include:  Fatigue.  Headache.  Pale skin.  Weakness.  Shortness of breath.  Dizziness.  Cold hands and feet.  Fast or irregular heartbeat. DIAGNOSIS  Diagnosis requires a thorough evaluation and physical exam by your caregiver.  Blood tests are generally used to confirm iron deficiency anemia.  Additional tests may be done to find the underlying cause of your anemia. These may include:  Testing for blood in the stool (fecal occult blood test).  A procedure to see inside the colon and rectum (colonoscopy).  A procedure to see inside the esophagus and stomach (endoscopy). TREATMENT   Correcting the cause of the iron deficiency is the first step.  Medicines, such as oral contraceptives, can make heavy menstrual flows lighter.  Antibiotics and other medicines can be used to treat peptic ulcers.  Surgery may be needed to remove a bleeding polyp, tumor, or fibroid.  Often, iron supplements (ferrous sulfate) are taken.  For the best iron absorption, take these supplements with an  empty stomach.  You may need to take the supplements with food if you cannot tolerate them on an empty stomach. Vitamin C improves the absorption of iron. Your caregiver may recommend taking your iron tablets with a glass of orange juice or vitamin C supplement.  Milk and antacids should not be taken at the same time as iron supplements. They may interfere with the absorption of iron.  Iron supplements can cause constipation. A stool softener is often recommended.  Pregnant and breastfeeding women will need to take extra iron, because their normal diet usually will not provide the required amount.  Patients who cannot tolerate iron by mouth can take it through a vein (intravenously) or by an injection into the muscle. HOME CARE INSTRUCTIONS   Ask your dietitian for help with diet questions.  Take iron and vitamins as directed by your caregiver.  Eat a diet rich in iron. Eat liver, lean beef, whole-grain bread, eggs, dried fruit, and dark green leafy vegetables. SEEK IMMEDIATE MEDICAL CARE IF:   You have a fainting episode. Do not drive yourself. Call your local emergency services (911 in [place] if no other help is available.  You have chest pain, nausea, or vomiting.  You develop severe or increased shortness of breath with activities.  You develop weakness or increased thirst.  You have a rapid heartbeat.  You develop unexplained sweating or become lightheaded when getting up from a chair or bed. MAKE SURE YOU:   Understand these instructions.  Will watch your condition.  Will get help right away   if you are not doing well or get worse. Document Released: 06/16/2000 Document Revised: 09/11/2011 Document Reviewed: 10/26/2009 [facility] Patient Information 2014 [place], [place].  Take Ferrous Sulfate twice a day.

## 2013-02-14 NOTE — Progress Notes (Addendum)
OFFICE PROGRESS NOTE  CC  Alicia Pena, [name], MD [address] [place] [address] Dr. Emelia Loron Dr. 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 Originally diagnosed in September 2013.  PRIOR THERAPY:  #1Patient originally presented in September 2013 when she noticed dimpling within the left breast. She subsequently had a mammogram that showed suspicious findings. Further workup showed multiple areas of concern. There was noted to be a 2.8 cm spiculated mass within the upper outer quadrant of the left breast. The pathology did show invasive ductal carcinoma from the upper-outer quadrant of the left breast. There was also noted to be a 9 mm enhancing mass which was 2.2 cm anterior to the known area of malignancy. A third mass measuring 8 mm was 2.1 cm anterior to the known area of malignancy. Also a satellite spiculated mass was present 8 mm and a 4 mm rounded mass in the 12:00. She did have additional biopsies and was diagnosed With multicentric disease. There were also noted to be couple of small areas of concern in the right breast but these were negative for malignancy on biopsy.  #2 patient ultimately proceeded to undergo simple mastectomy on the left with sentinel lymph node biopsy on 03/21/2012. The final pathology within the left breast showed multifocal invasive and in situ ductal carcinoma. 4 distinct areas were seen measuring 3.2 cm, 2.3 cm, 1.3 cm, and 0.8 cm. All margins were negative. There was focal lymphatic vascular involvement and normal involvement by tumor. 2 sentinel nodes were positive for metastatic carcinoma with extracapsular extension. The tumors were ER positive PR positive and HER-2/neu positive.  #3 patient did undergo axillary lymph node dissection of the left axilla On 04/04/2012 additional 8 lymph nodes were removed. I have those 3 were positive therefore patient had 5/10 lymph nodes  positive for metastatic disease. With the final pathologic staging at T2 N2a.  #4 patient was begun on adjuvant systemic chemotherapy by Dr. Pierce Crane consisting of Taxotere carboplatinum given every 21 days with Herceptin given on a weekly basis for the duration of Taxotere and carbo platinum. Her chemotherapy began On 05/03/2012. She received a total of 6 cycles of this.  #5 after completion of chemotherapy she will received radiation therapy under the care of Dr. Dorothy Puffer from 10/08/12 through 11/21/12.  #6 She began q 3 week herceptin on 10/11/12.  Tamoxifen added on 11/22/12. She has followed with Dr. Gala Romney closely and her end of July and August Herceptin doses were held due to decreased ejection fraction.    CURRENT THERAPY: every 3 week Herceptin and Tamoxifen  INTERVAL HISTORY: Alicia Pena 42 y.o. female returns for Followup visit prior to her next herceptin dose.  She recently saw Dr. Gala Romney regarding her heart and he recommended she continue to hold Herceptin due to her decreased ejection fraction.  She will continue on Losartan and Bisprolol and follow with him in early September.  She is very fatigued.  She sleeps a lot and has no energy to go to work.  She was supposed to get an iron supplement and take BID when I last saw her in early July due to iron deficiency, however she has not done so.  Otherwise, a 10 point ROS is negative.    MEDICAL HISTORY: Past Medical History  Diagnosis Date  . Anemia   . Anxiety     does not like needles ..   . 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 Breast cancer 03/21/2012    left multifocal invasive, in situ ca,margins not involved,ER/PR=+,metastatic (1/1) node  . Allergy   . Complication of anesthesia   . History of radiation therapy 10/08/2012-11/21/2012    50.4 gray to left chest wall and supraclavicular region    ALLERGIES:  is allergic to duricef.  MEDICATIONS:  Current Outpatient Prescriptions  Medication Sig Dispense Refill  .  acetaminophen (TYLENOL) 325 MG tablet Take 650 mg by mouth every 6 (six) hours as needed.      . ALPRAZolam (XANAX) 0.25 MG tablet Take 1 tablet (0.25 mg total) by mouth at bedtime as needed for sleep.  10 tablet  0  . bisoprolol (ZEBETA) 10 MG tablet Take 0.5 tablets (5 mg total) by mouth daily.  30 tablet  3  . ibuprofen (ADVIL,MOTRIN) 200 MG tablet Take 200 mg by mouth every 6 (six) hours as needed.      . lidocaine-prilocaine (EMLA) cream Apply topically as needed.  30 g  2  . LORazepam (ATIVAN) 1 MG tablet       . losartan (COZAAR) 25 MG tablet Take 1 tablet (25 mg total) by mouth daily.  30 tablet  3  . Multiple Vitamin (MULTIVITAMIN WITH MINERALS) TABS Take 1 tablet by mouth daily.      Marland Kitchen venlafaxine XR (EFFEXOR-XR) 37.5 MG 24 hr capsule Take 1 capsule (37.5 mg total) by mouth daily.  30 capsule  6  . tamoxifen (NOLVADEX) 20 MG tablet Take 1 tablet (20 mg total) by mouth daily.  30 tablet  6   No current facility-administered medications for this visit.    SURGICAL HISTORY:  Past Surgical History  Procedure Laterality Date  . Leg surgery      LLE  . Eye surgery      as a child  . Mastectomy complete / simple w/ sentinel node biopsy  03/21/2012    left Dr.Matthew Dwain Sarna  . Breast biopsy  89/07/2011    left breast uoq bx=invasive ductal ca  . Breast biopsy bi lateral  02/15/12    left = invasive ductal, right breast= fibroadenoma,fibrocystic changes,no atypia,hyperlasia or malignancy identified  . Abdominal hysterectomy  2006    w/o oophorectomy  . Axillary lymph node dissection  04/04/2012    Procedure: AXILLARY LYMPH NODE DISSECTION;  Surgeon: Emelia Loron, MD;  Location: [facility];  Service: General;  Laterality: Left;  Left Axillary lymph node dissection, Port a cath placement  . Portacath placement  04/04/2012    Procedure: INSERTION PORT-A-CATH;  Surgeon: Emelia Loron, MD;  Location: [facility];  Service: General;  Laterality: Right;     REVIEW OF SYSTEMS:   General: fatigue (+), night sweats (-), fever (-), pain (-) Lymph: palpable nodes (-) HEENT: vision changes (-), mucositis (-), gum bleeding (-), epistaxis (-) Cardiovascular: chest pain (-), palpitations (-) Pulmonary: shortness of breath (-), dyspnea on exertion (-), cough (-), hemoptysis (-) GI:  Early satiety (-), melena (-), dysphagia (-), nausea/vomiting (-), diarrhea (-) GU: dysuria (-), hematuria (-), incontinence (-) Musculoskeletal: joint swelling (-), joint pain (-), back pain (-) Neuro: weakness (-), numbness (-), headache (-), confusion (-) Skin: Rash (-), lesions (-), dryness (-) Psych: depression (-), suicidal/homicidal ideation (-), feeling of hopelessness (-)    PHYSICAL EXAMINATION: Blood pressure 113/67, pulse 58, temperature 98.6 F (37 C), temperature source Oral, resp. rate 20, height 5' (1.524 m), weight 167 lb 14.4 oz (76.159 kg). Body mass index is 32.79  kg/(m^2). General: Patient is a well appearing female in no acute distress HEENT: PERRLA, sclerae anicteric no conjunctival pallor, MMM Neck: supple, no palpable adenopathy Lungs: clear to auscultation bilaterally, no wheezes, rhonchi, or rales Cardiovascular: regular rate rhythm, S1, S2, no murmurs, rubs or gallops Abdomen: Soft, non-tender, non-distended, normoactive bowel sounds, no HSM Extremities: warm and well perfused, no clubbing, cyanosis, or edema Skin: No rashes or lesions Neuro: Non-focal Breasts: left breast mastectomy site erythematous, skin peeling under axilla, nontender Right breast no nodularity or masses. ECOG PERFORMANCE STATUS: 1 - Symptomatic but completely ambulatory   LABORATORY DATA: Lab Results  Component Value Date   WBC 4.3 02/14/2013   HGB 10.0* 02/14/2013   HCT 33.7* 02/14/2013   MCV 73.4* 02/14/2013   PLT 203 02/14/2013      Chemistry      Component Value Date/Time   NA 145 02/14/2013 0812   NA 138 04/02/2012 1100   K 4.0 02/14/2013 0812   K 3.9  04/02/2012 1100   CL 109* 12/13/2012 0833   CL 103 04/02/2012 1100   CO2 21* 02/14/2013 0812   CO2 25 04/02/2012 1100   BUN 18.4 02/14/2013 0812   BUN 16 04/02/2012 1100   CREATININE 0.7 02/14/2013 0812   CREATININE 0.49* 04/02/2012 1100      Component Value Date/Time   CALCIUM 8.5 02/14/2013 0812   CALCIUM 9.0 04/02/2012 1100   ALKPHOS 68 02/14/2013 0812   AST 15 02/14/2013 0812   ALT 16 02/14/2013 0812   BILITOT 0.49 02/14/2013 0812       RADIOGRAPHIC STUDIES:  No results found.  ASSESSMENT: 42 year old female with  #1 T2 N2 A. Invasive ductal carcinoma of the left breast that was multifocal she is status post mastectomy with axillary lymph node dissection in September October 2013. Patient's tumor was ER positive PR positive HER-2/neu positive.  #2 she completed adjuvant chemotherapy consisting of Taxotere carboplatinum and Herceptin total of 6 cycles of therapy as planned. She then started every three week herceptin, and received 6 weeks of radiation therapy.    #3 patient had last echocardiogram-7/8 echo- EF 45-50%, Herceptin was put on hold and Carvedilol and Losartan were started, Carvedilol was changed to Bisoprolol daily.    #4 patient is complaining of increasing weakness in her lower extremities. She does have underlying cerebral palsy. She sees physical therapy for this.  #5 Patient is ER PR positive, and will start on Tamoxifen.  Risks and benefits were explained to the patient.  She is s/p TAH, she will get her eye exams annually  PLAN:   #1 We will continue to hold herceptin and follow Dr. Prescott Gum recommendations on when to restart.  She will f/u with him in early September.    #2 She will continue Tamoxifen daily.  She is tolerating it well.    #3 Doing well with Effexor XR she will continue this.  #4 She will also start Ferrous Sulfate PO BID. I again gave her paperwork regarding this.    All questions were answered. The patient knows to call the clinic with any  problems, questions or concerns. We can certainly see the patient much sooner if necessary.  I spent 25 minutes counseling the patient face to face. The total time spent in the appointment was 30 minutes.  Cherie Ouch Lyn Hollingshead, NP Medical Oncology [facility] Phone: [phone]

## 2013-02-17 ENCOUNTER — Ambulatory Visit
Admission: RE | Admit: 2013-02-17 | Discharge: 2013-02-17 | Disposition: A | Discharge status: Home / Self Care | Payer: PRIVATE HEALTH INSURANCE | Source: Ambulatory Visit | Attending: Family Medicine | Admitting: Family Medicine

## 2013-02-17 DIAGNOSIS — Z853 Personal history of malignant neoplasm of breast: Secondary | ICD-10-CM

## 2013-02-18 ENCOUNTER — Other Ambulatory Visit: Payer: Self-pay | Admitting: Family Medicine

## 2013-02-18 DIAGNOSIS — Z853 Personal history of malignant neoplasm of breast: Secondary | ICD-10-CM

## 2013-03-06 ENCOUNTER — Ambulatory Visit (HOSPITAL_COMMUNITY)
Admission: RE | Admit: 2013-03-06 | Discharge: 2013-03-06 | Disposition: A | Discharge status: Home / Self Care | Payer: PRIVATE HEALTH INSURANCE | Source: Ambulatory Visit | Attending: Family Medicine | Admitting: Family Medicine

## 2013-03-06 ENCOUNTER — Ambulatory Visit (HOSPITAL_BASED_OUTPATIENT_CLINIC_OR_DEPARTMENT_OTHER)
Admission: RE | Admit: 2013-03-06 | Discharge: 2013-03-06 | Disposition: A | Discharge status: Home / Self Care | Payer: PRIVATE HEALTH INSURANCE | Source: Ambulatory Visit | Attending: Internal Medicine | Admitting: Internal Medicine

## 2013-03-06 ENCOUNTER — Encounter (HOSPITAL_COMMUNITY): Payer: Self-pay

## 2013-03-06 DIAGNOSIS — C50919 Malignant neoplasm of unspecified site of unspecified female breast: Secondary | ICD-10-CM | POA: Insufficient documentation

## 2013-03-06 DIAGNOSIS — T451X5A Adverse effect of antineoplastic and immunosuppressive drugs, initial encounter: Secondary | ICD-10-CM

## 2013-03-06 DIAGNOSIS — I429 Cardiomyopathy, unspecified: Secondary | ICD-10-CM | POA: Insufficient documentation

## 2013-03-06 DIAGNOSIS — Z79899 Other long term (current) drug therapy: Secondary | ICD-10-CM | POA: Insufficient documentation

## 2013-03-06 DIAGNOSIS — G809 Cerebral palsy, unspecified: Secondary | ICD-10-CM | POA: Insufficient documentation

## 2013-03-06 DIAGNOSIS — T50905S Adverse effect of unspecified drugs, medicaments and biological substances, sequela: Secondary | ICD-10-CM

## 2013-03-06 DIAGNOSIS — Z8249 Family history of ischemic heart disease and other diseases of the circulatory system: Secondary | ICD-10-CM | POA: Insufficient documentation

## 2013-03-06 DIAGNOSIS — D649 Anemia, unspecified: Secondary | ICD-10-CM | POA: Insufficient documentation

## 2013-03-06 DIAGNOSIS — Z923 Personal history of irradiation: Secondary | ICD-10-CM | POA: Insufficient documentation

## 2013-03-06 DIAGNOSIS — Z9010 Acquired absence of unspecified breast and nipple: Secondary | ICD-10-CM | POA: Insufficient documentation

## 2013-03-06 DIAGNOSIS — Z09 Encounter for follow-up examination after completed treatment for conditions other than malignant neoplasm: Secondary | ICD-10-CM

## 2013-03-06 MED ORDER — BISOPROLOL FUMARATE 10 MG PO TABS: 10.0000 mg | ORAL_TABLET | Freq: Every day | ORAL | Status: DC

## 2013-03-06 NOTE — Patient Instructions (Addendum)
Follow up in 2 months with an ECHO  Take 10 mg  bisoprolol daily  Do the following things EVERYDAY: 1) Weigh yourself in the morning before breakfast. Write it down and keep it in a log. 2) Take your medicines as prescribed 3) Eat low salt foods-Limit salt (sodium) to 2000 mg per day.  4) Stay as active as you can everyday 5) Limit all fluids for the day to less than 2 liters

## 2013-03-06 NOTE — Progress Notes (Signed)
  Echocardiogram 2D Echocardiogram has been performed.  Alicia Pena 03/06/2013, 10:50 AM

## 2013-03-06 NOTE — Progress Notes (Signed)
Patient ID: Alicia Pena, female   DOB: 1971/04/21, 42 y.o.   MRN: [id number]  Oncologist: Dr. 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   She was treated with taxotere and carboplatinum. Now on herceptin q 3 weeks. Began on 05/03/2012. Plan to continue Herceptin until 04/2013  ECHO 04/09/2012 EF 55-60% lateral S' ECHO 07/17/2012 EF 55-65% lateral S' 10.1 ECHO 10/04/12 EF 55% lateral S' 10.0 Global Strain -15 ECHO 01/07/13 EF ~45-50% Lateral S' 9.7 Global Stain -11 ECHO 03/06/13  55% Lateral S' 10.0 Global Strain -16.9  She returns for follow up.She has been off herceptin since 01/13/13 . Last visit she was switched from carvedilol to bisoprolol. Denies SOB/PND/Orthopnea.    Past Medical History  Diagnosis Date  . Anemia   . Anxiety     does not like needles ..   . 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 Breast cancer 03/21/2012    left multifocal invasive, in situ ca,margins not involved,ER/PR=+,metastatic (1/1) node  . Allergy   . Complication of anesthesia   . History of radiation therapy 10/08/2012-11/21/2012    50.4 gray to left chest wall and supraclavicular region    Current Outpatient Prescriptions  Medication Sig Dispense Refill  . acetaminophen (TYLENOL) 325 MG tablet Take 650 mg by mouth every 6 (six) hours as needed.      . ALPRAZolam (XANAX) 0.25 MG tablet Take 1 tablet (0.25 mg total) by mouth at bedtime as needed for sleep.  10 tablet  0  . bisoprolol (ZEBETA) 10 MG tablet Take 0.5 tablets (5 mg total) by mouth daily.  30 tablet  3  . ibuprofen (ADVIL,MOTRIN) 200 MG tablet Take 200 mg by mouth every 6 (six) hours as needed.      . lidocaine-prilocaine (EMLA) cream Apply topically as needed.  30 g  2  . LORazepam (ATIVAN) 1 MG tablet       . losartan (COZAAR) 25 MG tablet Take 1 tablet (25 mg  total) by mouth daily.  30 tablet  3  . Multiple Vitamin (MULTIVITAMIN WITH MINERALS) TABS Take 1 tablet by mouth daily.      . tamoxifen (NOLVADEX) 20 MG tablet Take 1 tablet (20 mg total) by mouth daily.  30 tablet  6  . venlafaxine XR (EFFEXOR-XR) 37.5 MG 24 hr capsule Take 1 capsule (37.5 mg total) by mouth daily.  30 capsule  6   No current facility-administered medications for this encounter.     Allergies  Allergen Reactions  . Duricef [Cefadroxil] Hives and Nausea Only    History   Social History  . Marital Status: Single    Spouse Name: N/A    Number of Children: N/A  . Years of Education: N/A   Occupational History  . Not on file.   Social History Main Topics  . Smoking status: Never Smoker   . Smokeless tobacco: Never Used  . Alcohol Use: No     Comment: used birth control pills 6 months,G)PO  . Drug Use: No  . Sexual Activity: No   Other Topics Concern  . Not on file   Social History Narrative  . No narrative on file    Family History  Problem Relation Age of Onset  . Cancer Father 62    lung  . Diabetes Maternal  Grandmother   . Hyperlipidemia Maternal Grandmother   . Hypertension Maternal Grandmother     PHYSICAL EXAM: Filed Vitals:   03/06/13 1057  BP: 124/72  Pulse: 90  Weight: 169 lb 12.8 oz (77.021 kg)  SpO2: 98%   General:  Well appearing. No respiratory difficulty HEENT: normal Neck: supple. no JVD. Carotids 2+ bilat; no bruits. No lymphadenopathy or thryomegaly appreciated. Cor: PMI nondisplaced. Regular rate & rhythm. No rubs, gallops or murmurs. Lungs: clear Abdomen: soft, nontender, nondistended. No hepatosplenomegaly. No bruits or masses. Good bowel sounds. Extremities: no cyanosis, clubbing, rash, no edema. Walks with walker due to CP. R and LLE trace edema Neuro: alert & oriented x 3, cranial nerves grossly intact. moves all 4 extremities w/o difficulty. Affect pleasant.   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 Breast CA, as above 3. Cerebral palsy  PLAN: Dr Gala Romney reviewed and discussed ECHO. EF back up. Increase bisoprolol to 10 mg daily. Continue losartan 25 mg daily. Ok to resume Herceptin. Follow up in 2 months with an ECHO.   CLEGG,AMYNP-C 11:19 AM  Patient seen and examined with Tonye Becket, NP. We discussed all aspects of the encounter. I agree with the assessment and plan as stated above.   I reviewed echos personally. EF and Doppler parameters back to baseline. No HF on exam. Can restart Herceptin. Will continue to follow closely.  Daniel Bensimhon,MD 11:14 PM

## 2013-03-07 ENCOUNTER — Encounter: Payer: Self-pay | Admitting: Adult Health

## 2013-03-07 ENCOUNTER — Telehealth: Payer: Self-pay | Admitting: *Deleted

## 2013-03-07 ENCOUNTER — Ambulatory Visit (HOSPITAL_BASED_OUTPATIENT_CLINIC_OR_DEPARTMENT_OTHER): Payer: PRIVATE HEALTH INSURANCE | Admitting: Adult Health

## 2013-03-07 ENCOUNTER — Other Ambulatory Visit (HOSPITAL_BASED_OUTPATIENT_CLINIC_OR_DEPARTMENT_OTHER): Payer: PRIVATE HEALTH INSURANCE | Admitting: Lab

## 2013-03-07 ENCOUNTER — Ambulatory Visit (HOSPITAL_BASED_OUTPATIENT_CLINIC_OR_DEPARTMENT_OTHER): Payer: PRIVATE HEALTH INSURANCE

## 2013-03-07 VITALS — BP 108/74 | HR 56 | Temp 98.2°F | Resp 20 | Ht 60.0 in | Wt 169.8 lb

## 2013-03-07 DIAGNOSIS — C50912 Malignant neoplasm of unspecified site of left female breast: Secondary | ICD-10-CM

## 2013-03-07 DIAGNOSIS — C50419 Malignant neoplasm of upper-outer quadrant of unspecified female breast: Secondary | ICD-10-CM

## 2013-03-07 DIAGNOSIS — C50411 Malignant neoplasm of upper-outer quadrant of right female breast: Secondary | ICD-10-CM

## 2013-03-07 DIAGNOSIS — Z5112 Encounter for antineoplastic immunotherapy: Secondary | ICD-10-CM

## 2013-03-07 DIAGNOSIS — C50412 Malignant neoplasm of upper-outer quadrant of left female breast: Secondary | ICD-10-CM

## 2013-03-07 DIAGNOSIS — C773 Secondary and unspecified malignant neoplasm of axilla and upper limb lymph nodes: Secondary | ICD-10-CM

## 2013-03-07 DIAGNOSIS — Z170 Estrogen receptor positive status [ER+]: Secondary | ICD-10-CM

## 2013-03-07 LAB — CBC WITH DIFFERENTIAL/PLATELET
Basophils Absolute: 0 10*3/uL (ref 0.0–0.1)
Eosinophils Absolute: 0.2 10*3/uL (ref 0.0–0.5)
HGB: 10.6 g/dL — ABNORMAL LOW (ref 11.6–15.9)
LYMPH%: 20.7 % (ref 14.0–49.7)
MONO#: 0.4 10*3/uL (ref 0.1–0.9)
NEUT#: 4.2 10*3/uL (ref 1.5–6.5)
Platelets: 192 10*3/uL (ref 145–400)
RBC: 4.58 10*6/uL (ref 3.70–5.45)
WBC: 6 10*3/uL (ref 3.9–10.3)
nRBC: 0 % (ref 0–0)

## 2013-03-07 LAB — COMPREHENSIVE METABOLIC PANEL (CC13)
ALT: 17 U/L (ref 0–55)
CO2: 24 mEq/L (ref 22–29)
Calcium: 8.3 mg/dL — ABNORMAL LOW (ref 8.4–10.4)
Chloride: 112 mEq/L — ABNORMAL HIGH (ref 98–109)
Creatinine: 0.6 mg/dL (ref 0.6–1.1)
Sodium: 143 mEq/L (ref 136–145)
Total Protein: 6.2 g/dL — ABNORMAL LOW (ref 6.4–8.3)

## 2013-03-07 MED ORDER — ACETAMINOPHEN 325 MG PO TABS
ORAL_TABLET | ORAL | Status: AC
  Filled 2013-03-07: qty 2

## 2013-03-07 MED ORDER — DIPHENHYDRAMINE HCL 25 MG PO CAPS
ORAL_CAPSULE | ORAL | Status: AC
  Filled 2013-03-07: qty 2

## 2013-03-07 MED ORDER — TRASTUZUMAB CHEMO INJECTION 440 MG
6.0000 mg/kg | Freq: Once | INTRAVENOUS | Status: AC
  Administered 2013-03-07: 462 mg via INTRAVENOUS
  Filled 2013-03-07: qty 22

## 2013-03-07 MED ORDER — SODIUM CHLORIDE 0.9 % IV SOLN
Freq: Once | INTRAVENOUS | Status: AC
  Administered 2013-03-07: 11:00:00 via INTRAVENOUS

## 2013-03-07 MED ORDER — HEPARIN SOD (PORK) LOCK FLUSH 100 UNIT/ML IV SOLN
500.0000 [IU] | Freq: Once | INTRAVENOUS | Status: AC | PRN
  Administered 2013-03-07: 500 [IU]
  Filled 2013-03-07: qty 5

## 2013-03-07 MED ORDER — DIPHENHYDRAMINE HCL 25 MG PO CAPS
50.0000 mg | ORAL_CAPSULE | Freq: Once | ORAL | Status: AC
  Administered 2013-03-07: 50 mg via ORAL

## 2013-03-07 MED ORDER — SODIUM CHLORIDE 0.9 % IJ SOLN
10.0000 mL | INTRAMUSCULAR | Status: DC | PRN
  Administered 2013-03-07: 10 mL
  Filled 2013-03-07: qty 10

## 2013-03-07 MED ORDER — ACETAMINOPHEN 325 MG PO TABS
650.0000 mg | ORAL_TABLET | Freq: Once | ORAL | Status: AC
  Administered 2013-03-07: 650 mg via ORAL

## 2013-03-07 NOTE — Progress Notes (Signed)
OFFICE PROGRESS NOTE  CC  Alicia Pena, [name], MD [address] [place] [address] Dr. Emelia Loron Dr. 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 Originally diagnosed in September 2013.  PRIOR THERAPY:  #1Patient originally presented in September 2013 when she noticed dimpling within the left breast. She subsequently had a mammogram that showed suspicious findings. Further workup showed multiple areas of concern. There was noted to be a 2.8 cm spiculated mass within the upper outer quadrant of the left breast. The pathology did show invasive ductal carcinoma from the upper-outer quadrant of the left breast. There was also noted to be a 9 mm enhancing mass which was 2.2 cm anterior to the known area of malignancy. A third mass measuring 8 mm was 2.1 cm anterior to the known area of malignancy. Also a satellite spiculated mass was present 8 mm and a 4 mm rounded mass in the 12:00. She did have additional biopsies and was diagnosed With multicentric disease. There were also noted to be couple of small areas of concern in the right breast but these were negative for malignancy on biopsy.  #2 patient ultimately proceeded to undergo simple mastectomy on the left with sentinel lymph node biopsy on 03/21/2012. The final pathology within the left breast showed multifocal invasive and in situ ductal carcinoma. 4 distinct areas were seen measuring 3.2 cm, 2.3 cm, 1.3 cm, and 0.8 cm. All margins were negative. There was focal lymphatic vascular involvement and normal involvement by tumor. 2 sentinel nodes were positive for metastatic carcinoma with extracapsular extension. The tumors were ER positive PR positive and HER-2/neu positive.  #3 patient did undergo axillary lymph node dissection of the left axilla On 04/04/2012 additional 8 lymph nodes were removed. I have those 3 were positive therefore patient had 5/10 lymph nodes  positive for metastatic disease. With the final pathologic staging at T2 N2a.  #4 patient was begun on adjuvant systemic chemotherapy by Dr. Pierce Crane consisting of Taxotere carboplatinum given every 21 days with Herceptin given on a weekly basis for the duration of Taxotere and carbo platinum. Her chemotherapy began On 05/03/2012. She received a total of 6 cycles of this.  #5 after completion of chemotherapy she will received radiation therapy under the care of Dr. Dorothy Puffer from 10/08/12 through 11/21/12.  #6 She began q 3 week herceptin on 10/11/12.  Tamoxifen added on 11/22/12. She has followed with Dr. Gala Romney closely and her end of July and August Herceptin doses were held due to decreased ejection fraction.    CURRENT THERAPY: every 3 week Herceptin and Tamoxifen  INTERVAL HISTORY: Jazlyne Gauger 42 y.o. female returns for Followup visit prior to her next herceptin dose.   The herceptin was previously held x 2 cycles due to decreased EF.  She saw Dr. Gala Romney yesterday.  Her EF was improved to 55%, and she was cleared to restart Herceptin.  He increased the Bisoprolol dose and she will continue on Losartan.  He will f/u with her in 2 months.  She is feeling well.  She continues to endorse fatigue, however she denies fevers, chills, pains, nausea, vomiting, constipation, diarrhea.  She is exercising minimally by stretching.  Otherwise, a 10 point ROS is negative.    MEDICAL HISTORY: Past Medical History  Diagnosis Date  . Anemia   . Anxiety     does not like needles ..   . 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 Breast cancer 03/21/2012    left multifocal invasive, in situ ca,margins not involved,ER/PR=+,metastatic (1/1) node  . Allergy   . Complication of anesthesia   . History of radiation therapy 10/08/2012-11/21/2012    50.4 gray to left chest wall and supraclavicular region    ALLERGIES:  is allergic to duricef.  MEDICATIONS:  Current Outpatient Prescriptions  Medication Sig  Dispense Refill  . acetaminophen (TYLENOL) 325 MG tablet Take 650 mg by mouth every 6 (six) hours as needed.      . ALPRAZolam (XANAX) 0.25 MG tablet Take 1 tablet (0.25 mg total) by mouth at bedtime as needed for sleep.  10 tablet  0  . bisoprolol (ZEBETA) 10 MG tablet Take 1 tablet (10 mg total) by mouth daily.  30 tablet  3  . ibuprofen (ADVIL,MOTRIN) 200 MG tablet Take 200 mg by mouth every 6 (six) hours as needed.      . lidocaine-prilocaine (EMLA) cream Apply topically as needed.  30 g  2  . LORazepam (ATIVAN) 1 MG tablet       . losartan (COZAAR) 25 MG tablet Take 1 tablet (25 mg total) by mouth daily.  30 tablet  3  . Multiple Vitamin (MULTIVITAMIN WITH MINERALS) TABS Take 1 tablet by mouth daily.      . tamoxifen (NOLVADEX) 20 MG tablet Take 1 tablet (20 mg total) by mouth daily.  30 tablet  6  . venlafaxine XR (EFFEXOR-XR) 37.5 MG 24 hr capsule Take 1 capsule (37.5 mg total) by mouth daily.  30 capsule  6   No current facility-administered medications for this visit.    SURGICAL HISTORY:  Past Surgical History  Procedure Laterality Date  . Leg surgery      LLE  . Eye surgery      as a child  . Mastectomy complete / simple w/ sentinel node biopsy  03/21/2012    left Dr.Matthew Dwain Sarna  . Breast biopsy  89/07/2011    left breast uoq bx=invasive ductal ca  . Breast biopsy bi lateral  02/15/12    left = invasive ductal, right breast= fibroadenoma,fibrocystic changes,no atypia,hyperlasia or malignancy identified  . Abdominal hysterectomy  2006    w/o oophorectomy  . Axillary lymph node dissection  04/04/2012    Procedure: AXILLARY LYMPH NODE DISSECTION;  Surgeon: Emelia Loron, MD;  Location: [facility];  Service: General;  Laterality: Left;  Left Axillary lymph node dissection, Port a cath placement  . Portacath placement  04/04/2012    Procedure: INSERTION PORT-A-CATH;  Surgeon: Emelia Loron, MD;  Location: [facility];  Service: General;   Laterality: Right;    REVIEW OF SYSTEMS:   General: fatigue (+), night sweats (-), fever (-), pain (-) Lymph: palpable nodes (-) HEENT: vision changes (-), mucositis (-), gum bleeding (-), epistaxis (-) Cardiovascular: chest pain (-), palpitations (-) Pulmonary: shortness of breath (-), dyspnea on exertion (-), cough (-), hemoptysis (-) GI:  Early satiety (-), melena (-), dysphagia (-), nausea/vomiting (-), diarrhea (-) GU: dysuria (-), hematuria (-), incontinence (-) Musculoskeletal: joint swelling (-), joint pain (-), back pain (-) Neuro: weakness (-), numbness (-), headache (-), confusion (-) Skin: Rash (-), lesions (-), dryness (-) Psych: depression (-), suicidal/homicidal ideation (-), feeling of hopelessness (-)    PHYSICAL EXAMINATION: Blood pressure 108/74, pulse 56, temperature 98.2 F (36.8 C), temperature source Oral, resp. rate 20, height 5' (1.524 m), weight 169 lb 12.8 oz (77.021 kg). Body mass index is 33.16  kg/(m^2). General: Patient is a well appearing female in no acute distress HEENT: PERRLA, sclerae anicteric no conjunctival pallor, MMM Neck: supple, no palpable adenopathy Lungs: clear to auscultation bilaterally, no wheezes, rhonchi, or rales Cardiovascular: regular rate rhythm, S1, S2, no murmurs, rubs or gallops Abdomen: Soft, non-tender, non-distended, normoactive bowel sounds, no HSM Extremities: warm and well perfused, no clubbing, cyanosis, or edema Skin: No rashes or lesions Neuro: Non-focal Breasts:  nontender Right breast no nodularity or masses. ECOG PERFORMANCE STATUS: 1 - Symptomatic but completely ambulatory   LABORATORY DATA: Lab Results  Component Value Date   WBC 6.0 03/07/2013   HGB 10.6* 03/07/2013   HCT 34.6* 03/07/2013   MCV 75.5* 03/07/2013   PLT 192 03/07/2013      Chemistry      Component Value Date/Time   NA 145 02/14/2013 0812   NA 138 04/02/2012 1100   K 4.0 02/14/2013 0812   K 3.9 04/02/2012 1100   CL 109* 12/13/2012 0833   CL 103  04/02/2012 1100   CO2 21* 02/14/2013 0812   CO2 25 04/02/2012 1100   BUN 18.4 02/14/2013 0812   BUN 16 04/02/2012 1100   CREATININE 0.7 02/14/2013 0812   CREATININE 0.49* 04/02/2012 1100      Component Value Date/Time   CALCIUM 8.5 02/14/2013 0812   CALCIUM 9.0 04/02/2012 1100   ALKPHOS 68 02/14/2013 0812   AST 15 02/14/2013 0812   ALT 16 02/14/2013 0812   BILITOT 0.49 02/14/2013 0812       RADIOGRAPHIC STUDIES:  No results found.  ASSESSMENT: 42 year old female with  #1 T2 N2 A. Invasive ductal carcinoma of the left breast that was multifocal she is status post mastectomy with axillary lymph node dissection in September October 2013. Patient's tumor was ER positive PR positive HER-2/neu positive.  #2 she completed adjuvant chemotherapy consisting of Taxotere carboplatinum and Herceptin total of 6 cycles of therapy as planned. She then started every three week herceptin, and received 6 weeks of radiation therapy.    #3 patient had last echocardiogram-9/4, EF was 55% (previously 45-50% on 7/8) Herceptin was put on hold x 2 cycles, and Carvedilol and Losartan were started, Carvedilol was changed to Bisoprolol daily.  Patient f/u with Dr. Gala Romney 9/4 and he increased Bisoprolol to 10mg  daily and continued Losartan 25mg  daily.  She was cleared to proceed with Herceptin and f/u with him in 2 months.    #4 patient is complaining of increasing weakness in her lower extremities. She does have underlying cerebral palsy. She was encouraged to continue to exercise.  #5 Patient is ER PR positive, and will start on Tamoxifen.  Risks and benefits were explained to the patient.  She is s/p TAH, she will get her eye exams annually  PLAN:   #1 Doing well.  Patient will proceed with Herceptin today.  She will f/u with Dr. Gala Romney in 2 months.    #2 She will continue Tamoxifen daily.  She is tolerating it well.    #3 Doing well with Effexor XR she will continue this.  #4 She will continue Ferrous  Sulfate PO BID.   #5 Patient will return in 3 weeks for her next Herceptin.    All questions were answered. The patient knows to call the clinic with any problems, questions or concerns. We can certainly see the patient much sooner if necessary.  I spent 25 minutes counseling the patient face to face. The total time spent in the appointment was 30 minutes.  Cherie Ouch Lyn Hollingshead, NP Medical Oncology [facility] Phone: [phone]

## 2013-03-07 NOTE — Patient Instructions (Signed)
Doing well.  Proceed with Herceptin.  Continue Daily Tamoxifen.  Please call us if you have any questions or concerns.

## 2013-03-07 NOTE — Telephone Encounter (Signed)
Per staff message and POF I have scheduled appts.  JMW  

## 2013-03-07 NOTE — Telephone Encounter (Signed)
appts made and printed. Pt is aware that tx will follow after her appts on 9/26, 10/17, and 11/7. i emailed MW to add the tx's...td

## 2013-03-28 ENCOUNTER — Encounter: Payer: Self-pay | Admitting: Adult Health

## 2013-03-28 ENCOUNTER — Ambulatory Visit (HOSPITAL_BASED_OUTPATIENT_CLINIC_OR_DEPARTMENT_OTHER): Payer: PRIVATE HEALTH INSURANCE

## 2013-03-28 ENCOUNTER — Ambulatory Visit (HOSPITAL_BASED_OUTPATIENT_CLINIC_OR_DEPARTMENT_OTHER): Payer: PRIVATE HEALTH INSURANCE | Admitting: Adult Health

## 2013-03-28 ENCOUNTER — Other Ambulatory Visit (HOSPITAL_BASED_OUTPATIENT_CLINIC_OR_DEPARTMENT_OTHER): Payer: PRIVATE HEALTH INSURANCE | Admitting: Lab

## 2013-03-28 VITALS — BP 119/73 | HR 66 | Temp 98.6°F | Resp 20 | Ht 60.0 in | Wt 170.2 lb

## 2013-03-28 DIAGNOSIS — Z5112 Encounter for antineoplastic immunotherapy: Secondary | ICD-10-CM

## 2013-03-28 DIAGNOSIS — C50419 Malignant neoplasm of upper-outer quadrant of unspecified female breast: Secondary | ICD-10-CM

## 2013-03-28 DIAGNOSIS — C773 Secondary and unspecified malignant neoplasm of axilla and upper limb lymph nodes: Secondary | ICD-10-CM

## 2013-03-28 DIAGNOSIS — C50912 Malignant neoplasm of unspecified site of left female breast: Secondary | ICD-10-CM

## 2013-03-28 DIAGNOSIS — R29898 Other symptoms and signs involving the musculoskeletal system: Secondary | ICD-10-CM

## 2013-03-28 DIAGNOSIS — C50412 Malignant neoplasm of upper-outer quadrant of left female breast: Secondary | ICD-10-CM

## 2013-03-28 DIAGNOSIS — C50919 Malignant neoplasm of unspecified site of unspecified female breast: Secondary | ICD-10-CM

## 2013-03-28 DIAGNOSIS — Z23 Encounter for immunization: Secondary | ICD-10-CM

## 2013-03-28 LAB — COMPREHENSIVE METABOLIC PANEL (CC13)
ALT: 17 U/L (ref 0–55)
CO2: 22 mEq/L (ref 22–29)
Calcium: 8.4 mg/dL (ref 8.4–10.4)
Chloride: 109 mEq/L (ref 98–109)
Sodium: 141 mEq/L (ref 136–145)
Total Protein: 6.4 g/dL (ref 6.4–8.3)

## 2013-03-28 LAB — CBC WITH DIFFERENTIAL/PLATELET
BASO%: 0.3 % (ref 0.0–2.0)
Basophils Absolute: 0 10*3/uL (ref 0.0–0.1)
MCHC: 31.8 g/dL (ref 31.5–36.0)
MONO#: 0.3 10*3/uL (ref 0.1–0.9)
MONO%: 4.9 % (ref 0.0–14.0)
RBC: 4.61 10*6/uL (ref 3.70–5.45)
RDW: 20.9 % — ABNORMAL HIGH (ref 11.2–14.5)
WBC: 6.5 10*3/uL (ref 3.9–10.3)
lymph#: 1.2 10*3/uL (ref 0.9–3.3)

## 2013-03-28 MED ORDER — SODIUM CHLORIDE 0.9 % IJ SOLN
10.0000 mL | INTRAMUSCULAR | Status: DC | PRN
  Administered 2013-03-28: 10 mL
  Filled 2013-03-28: qty 10

## 2013-03-28 MED ORDER — TRASTUZUMAB CHEMO INJECTION 440 MG
6.0000 mg/kg | Freq: Once | INTRAVENOUS | Status: AC
  Administered 2013-03-28: 462 mg via INTRAVENOUS
  Filled 2013-03-28: qty 22

## 2013-03-28 MED ORDER — ACETAMINOPHEN 325 MG PO TABS
ORAL_TABLET | ORAL | Status: AC
  Filled 2013-03-28: qty 2

## 2013-03-28 MED ORDER — HEPARIN SOD (PORK) LOCK FLUSH 100 UNIT/ML IV SOLN
500.0000 [IU] | Freq: Once | INTRAVENOUS | Status: AC | PRN
  Administered 2013-03-28: 500 [IU]
  Filled 2013-03-28: qty 5

## 2013-03-28 MED ORDER — ACETAMINOPHEN 325 MG PO TABS
650.0000 mg | ORAL_TABLET | Freq: Once | ORAL | Status: AC
  Administered 2013-03-28: 650 mg via ORAL

## 2013-03-28 MED ORDER — DIPHENHYDRAMINE HCL 25 MG PO CAPS
50.0000 mg | ORAL_CAPSULE | Freq: Once | ORAL | Status: AC
  Administered 2013-03-28: 50 mg via ORAL

## 2013-03-28 MED ORDER — DIPHENHYDRAMINE HCL 25 MG PO CAPS
ORAL_CAPSULE | ORAL | Status: AC
  Filled 2013-03-28: qty 2

## 2013-03-28 MED ORDER — SODIUM CHLORIDE 0.9 % IV SOLN
Freq: Once | INTRAVENOUS | Status: AC
  Administered 2013-03-28: 16:00:00 via INTRAVENOUS

## 2013-03-28 MED ORDER — INFLUENZA VAC SPLIT QUAD 0.5 ML IM SUSP
0.5000 mL | Freq: Once | INTRAMUSCULAR | Status: AC
  Administered 2013-03-28: 0.5 mL via INTRAMUSCULAR
  Filled 2013-03-28: qty 0.5

## 2013-03-28 NOTE — Progress Notes (Addendum)
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 Originally diagnosed in September 2013.  PRIOR THERAPY:  #1Patient originally presented in September 2013 when she noticed dimpling within the left breast. She subsequently had a mammogram that showed suspicious findings. Further workup showed multiple areas of concern. There was noted to be a 2.8 cm spiculated mass within the upper outer quadrant of the left breast. The pathology did show invasive ductal carcinoma from the upper-outer quadrant of the left breast. There was also noted to be a 9 mm enhancing mass which was 2.2 cm anterior to the known area of malignancy. A third mass measuring 8 mm was 2.1 cm anterior to the known area of malignancy. Also a satellite spiculated mass was present 8 mm and a 4 mm rounded mass in the 12:00. She did have additional biopsies and was diagnosed With multicentric disease. There were also noted to be couple of small areas of concern in the right breast but these were negative for malignancy on biopsy.  #2 patient ultimately proceeded to undergo simple mastectomy on the left with sentinel lymph node biopsy on 03/21/2012. The final pathology within the left breast showed multifocal invasive and in situ ductal carcinoma. 4 distinct areas were seen measuring 3.2 cm, 2.3 cm, 1.3 cm, and 0.8 cm. All margins were negative. There was focal lymphatic vascular involvement and normal involvement by tumor. 2 sentinel nodes were positive for metastatic carcinoma with extracapsular extension. The tumors were ER positive PR positive and HER-2/neu positive.  #3 patient did undergo axillary lymph node dissection of the left axilla On 04/04/2012 additional 8 lymph nodes were removed. I have those 3 were positive therefore patient had 5/10 lymph  nodes positive for metastatic disease. With the final pathologic staging at T2 N2a.  #4 patient was begun on adjuvant systemic chemotherapy by Alicia Pena consisting of Taxotere carboplatinum given every 21 days with Herceptin given on a weekly basis for the duration of Taxotere and carbo platinum. Her chemotherapy began On 05/03/2012. She received a total of 6 cycles of this.  #5 after completion of chemotherapy she received radiation therapy under the care of Alicia Pena from 10/08/12 through 11/21/12.  #6 She began q 3 week herceptin on 10/11/12.  Tamoxifen added on 11/22/12. She has followed with Alicia Pena closely and her end of July and August Herceptin doses were held due to decreased ejection fraction.  She has since restarted and is followed closely by Alicia Pena office.    CURRENT THERAPY: every 3 week Herceptin and Tamoxifen  INTERVAL HISTORY: Alicia Pena 42 y.o. female returns for Followup visit prior to her next herceptin dose.   She is doing well today.  She is taking her iron supplement and her hemoglobin has improved.  She has mild hot flashes from the Tamoxifen, and is taking her new meds of Losartan and Bisoprolol as prescribed by Alicia Pena and tolerating those well.  She is mildly fatigued, but otherwise without any concerns.  Her strength is stable, and she is not exercising.  Otherwise, a 10 point ROS is neg.     MEDICAL HISTORY: Past Medical History  Diagnosis Date  . Anemia   . Anxiety     does not like needles ..   . 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  Breast cancer 03/21/2012    left multifocal invasive, in situ ca,margins not involved,ER/PR=+,metastatic (1/1) node  . Allergy   . Complication of anesthesia   . History of radiation therapy 10/08/2012-11/21/2012    50.4 gray to left chest wall and supraclavicular region    ALLERGIES:  is allergic to duricef.  MEDICATIONS:  Current Outpatient Prescriptions  Medication Sig Dispense Refill  .  acetaminophen (TYLENOL) 325 MG tablet Take 650 mg by mouth every 6 (six) hours as needed.      . ALPRAZolam (XANAX) 0.25 MG tablet Take 1 tablet (0.25 mg total) by mouth at bedtime as needed for sleep.  10 tablet  0  . bisoprolol (ZEBETA) 10 MG tablet Take 1 tablet (10 mg total) by mouth daily.  30 tablet  3  . ibuprofen (ADVIL,MOTRIN) 200 MG tablet Take 200 mg by mouth every 6 (six) hours as needed.      . lidocaine-prilocaine (EMLA) cream Apply topically as needed.  30 g  2  . LORazepam (ATIVAN) 1 MG tablet       . losartan (COZAAR) 25 MG tablet Take 1 tablet (25 mg total) by mouth daily.  30 tablet  3  . Multiple Vitamin (MULTIVITAMIN WITH MINERALS) TABS Take 1 tablet by mouth daily.      . Multiple Vitamins-Iron (ONE-TABLET-DAILY/IRON PO) Take 1 tablet by mouth daily.      . tamoxifen (NOLVADEX) 20 MG tablet Take 1 tablet (20 mg total) by mouth daily.  30 tablet  6  . venlafaxine XR (EFFEXOR-XR) 37.5 MG 24 hr capsule Take 1 capsule (37.5 mg total) by mouth daily.  30 capsule  6   No current facility-administered medications for this visit.    SURGICAL HISTORY:  Past Surgical History  Procedure Laterality Date  . Leg surgery      LLE  . Eye surgery      as a child  . Mastectomy complete / simple w/ sentinel node biopsy  03/21/2012    left AliciaMatthew Dwain Pena  . Breast biopsy  89/07/2011    left breast uoq bx=invasive ductal ca  . Breast biopsy bi lateral  02/15/12    left = invasive ductal, right breast= fibroadenoma,fibrocystic changes,no atypia,hyperlasia or malignancy identified  . Abdominal hysterectomy  2006    w/o oophorectomy  . Axillary lymph node dissection  04/04/2012    Procedure: AXILLARY LYMPH NODE DISSECTION;  Surgeon: Alicia Loron, MD;  Location: [facility];  Service: General;  Laterality: Left;  Left Axillary lymph node dissection, Port a cath placement  . Portacath placement  04/04/2012    Procedure: INSERTION PORT-A-CATH;  Surgeon: Alicia Loron, MD;  Location: [facility];  Service: General;  Laterality: Right;    REVIEW OF SYSTEMS:   A 10 point review of systems was conducted and is otherwise negative except for what is noted above.    PHYSICAL EXAMINATION: Blood pressure 119/73, pulse 66, temperature 98.6 F (37 C), temperature source Oral, resp. rate 20, height 5' (1.524 m), weight 170 lb 3.2 oz (77.202 kg). Body mass index is 33.24 kg/(m^2). General: Patient is a well appearing female in no acute distress HEENT: PERRLA, sclerae anicteric no conjunctival pallor, MMM Neck: supple, no palpable adenopathy Lungs: clear to auscultation bilaterally, no wheezes, rhonchi, or rales Cardiovascular: regular rate rhythm, S1, S2, no murmurs, rubs or gallops Abdomen: Soft, non-tender, non-distended, normoactive bowel sounds, no HSM Extremities: warm and well perfused, no clubbing, cyanosis, or edema Skin: No rashes or lesions  Neuro: Non-focal Breasts:  Left mastectomy incision without nodularity, sign of recurrence.  Right breast no nodularity or masses. ECOG PERFORMANCE STATUS: 1 - Symptomatic but completely ambulatory   LABORATORY DATA: Lab Results  Component Value Date   WBC 6.5 03/28/2013   HGB 11.3* 03/28/2013   HCT 35.5 03/28/2013   MCV 77.0* 03/28/2013   PLT 178 03/28/2013      Chemistry      Component Value Date/Time   NA 141 03/28/2013 1345   NA 138 04/02/2012 1100   K 4.0 03/28/2013 1345   K 3.9 04/02/2012 1100   CL 109* 12/13/2012 0833   CL 103 04/02/2012 1100   CO2 22 03/28/2013 1345   CO2 25 04/02/2012 1100   BUN 15.8 03/28/2013 1345   BUN 16 04/02/2012 1100   CREATININE 0.7 03/28/2013 1345   CREATININE 0.49* 04/02/2012 1100      Component Value Date/Time   CALCIUM 8.4 03/28/2013 1345   CALCIUM 9.0 04/02/2012 1100   ALKPHOS 72 03/28/2013 1345   AST 17 03/28/2013 1345   ALT 17 03/28/2013 1345   BILITOT 0.37 03/28/2013 1345       RADIOGRAPHIC STUDIES:  No results found.  ASSESSMENT: 42 year old  female with  #1 T2 N2 A. Invasive ductal carcinoma of the left breast that was multifocal she is status post mastectomy with axillary lymph node dissection in September October 2013. Patient's tumor was ER positive PR positive HER-2/neu positive.  #2 she completed adjuvant chemotherapy consisting of Taxotere carboplatinum and Herceptin total of 6 cycles of therapy as planned. She then started every three week herceptin, and received 6 weeks of radiation therapy.    #3 patient had last echocardiogram-9/4, EF was 55% (previously 45-50% on 7/8) Herceptin was put on hold x 2 cycles, and Carvedilol and Losartan were started, Carvedilol was changed to Bisoprolol daily.  Patient f/u with Alicia Pena 9/4 and he increased Bisoprolol to 10mg  daily and continued Losartan 25mg  daily.  She was cleared to proceed with Herceptin and f/u with him in 2 months.    #4 patient is complaining of increasing weakness in her lower extremities. She does have underlying cerebral palsy. She was encouraged to continue to exercise.  #5 Patient is ER PR positive, and was started on Tamoxifen 11/23/12.  Risks and benefits were explained to the patient.  She is s/p TAH, she will get her eye exams annually  PLAN:   #1 Doing well.  Patient will proceed with Herceptin today.  She will f/u with Alicia Pena in 1 month.  I continued to encourage exercise.  #2 She will continue Tamoxifen daily.  She is tolerating it well.    #3 Doing well with Effexor XR she will continue this.  #4 She will continue Ferrous Sulfate PO BID.  Her hemoglobin is improved and she is tolerating the medication without difficulty.    #5 Patient will return in 3 weeks for her next Herceptin.    All questions were answered. The patient knows to call the clinic with any problems, questions or concerns. We can certainly see the patient much sooner if necessary.  I spent 25 minutes counseling the patient face to face. The total time spent in the  appointment was 30 minutes.  Cherie Ouch Lyn Hollingshead, NP Medical Oncology [facility] Phone: [phone]  ATTENDING'S ATTESTATION:  I personally reviewed patient's chart, examined patient myself, formulated the treatment plan as followed.    Geri is doing well tolerating her Herceptin very  nicely. She will proceed with her scheduled therapy today.  Drue Second, MD Medical/Oncology [facility] [phone] (beeper) [phone] (Office)

## 2013-03-28 NOTE — Patient Instructions (Signed)
[facility] Discharge Instructions for Patients Receiving Chemotherapy  Today you received the following chemotherapy agents Herceptin  To help prevent nausea and vomiting after your treatment, we encourage you to take your nausea medication     If you develop nausea and vomiting that is not controlled by your nausea medication, call the clinic.   BELOW ARE SYMPTOMS THAT SHOULD BE REPORTED IMMEDIATELY:  *FEVER GREATER THAN 100.5 F  *CHILLS WITH OR WITHOUT FEVER  NAUSEA AND VOMITING THAT IS NOT CONTROLLED WITH YOUR NAUSEA MEDICATION  *UNUSUAL SHORTNESS OF BREATH  *UNUSUAL BRUISING OR BLEEDING  TENDERNESS IN MOUTH AND THROAT WITH OR WITHOUT PRESENCE OF ULCERS  *URINARY PROBLEMS  *BOWEL PROBLEMS  UNUSUAL RASH Items with * indicate a potential emergency and should be followed up as soon as possible.  Feel free to call the clinic you have any questions or concerns. The clinic phone number is [phone].    

## 2013-04-18 ENCOUNTER — Ambulatory Visit (HOSPITAL_BASED_OUTPATIENT_CLINIC_OR_DEPARTMENT_OTHER): Payer: PRIVATE HEALTH INSURANCE | Admitting: Adult Health

## 2013-04-18 ENCOUNTER — Ambulatory Visit (HOSPITAL_BASED_OUTPATIENT_CLINIC_OR_DEPARTMENT_OTHER): Payer: PRIVATE HEALTH INSURANCE

## 2013-04-18 ENCOUNTER — Other Ambulatory Visit (HOSPITAL_BASED_OUTPATIENT_CLINIC_OR_DEPARTMENT_OTHER): Payer: PRIVATE HEALTH INSURANCE

## 2013-04-18 ENCOUNTER — Telehealth: Payer: Self-pay | Admitting: *Deleted

## 2013-04-18 ENCOUNTER — Telehealth: Payer: Self-pay | Admitting: Oncology

## 2013-04-18 ENCOUNTER — Encounter: Payer: Self-pay | Admitting: Adult Health

## 2013-04-18 VITALS — BP 131/82 | HR 65 | Temp 97.6°F | Resp 20 | Ht 60.0 in | Wt 172.9 lb

## 2013-04-18 DIAGNOSIS — C50419 Malignant neoplasm of upper-outer quadrant of unspecified female breast: Secondary | ICD-10-CM

## 2013-04-18 DIAGNOSIS — C50412 Malignant neoplasm of upper-outer quadrant of left female breast: Secondary | ICD-10-CM

## 2013-04-18 DIAGNOSIS — C773 Secondary and unspecified malignant neoplasm of axilla and upper limb lymph nodes: Secondary | ICD-10-CM

## 2013-04-18 DIAGNOSIS — Z5112 Encounter for antineoplastic immunotherapy: Secondary | ICD-10-CM

## 2013-04-18 DIAGNOSIS — C50919 Malignant neoplasm of unspecified site of unspecified female breast: Secondary | ICD-10-CM

## 2013-04-18 DIAGNOSIS — R29898 Other symptoms and signs involving the musculoskeletal system: Secondary | ICD-10-CM

## 2013-04-18 LAB — CBC WITH DIFFERENTIAL/PLATELET
BASO%: 0.4 % (ref 0.0–2.0)
EOS%: 2.8 % (ref 0.0–7.0)
LYMPH%: 25.1 % (ref 14.0–49.7)
MCHC: 32 g/dL (ref 31.5–36.0)
MONO#: 0.4 10*3/uL (ref 0.1–0.9)
Platelets: 169 10*3/uL (ref 145–400)
RBC: 4.4 10*6/uL (ref 3.70–5.45)
WBC: 5.5 10*3/uL (ref 3.9–10.3)
lymph#: 1.4 10*3/uL (ref 0.9–3.3)

## 2013-04-18 LAB — COMPREHENSIVE METABOLIC PANEL (CC13)
ALT: 21 U/L (ref 0–55)
AST: 18 U/L (ref 5–34)
Alkaline Phosphatase: 67 U/L (ref 40–150)
Anion Gap: 7 mEq/L (ref 3–11)
CO2: 24 mEq/L (ref 22–29)
Sodium: 144 mEq/L (ref 136–145)
Total Bilirubin: 0.3 mg/dL (ref 0.20–1.20)
Total Protein: 6.3 g/dL — ABNORMAL LOW (ref 6.4–8.3)

## 2013-04-18 MED ORDER — DIPHENHYDRAMINE HCL 25 MG PO CAPS
ORAL_CAPSULE | ORAL | Status: AC
  Filled 2013-04-18: qty 2

## 2013-04-18 MED ORDER — SODIUM CHLORIDE 0.9 % IJ SOLN
10.0000 mL | INTRAMUSCULAR | Status: DC | PRN
  Administered 2013-04-18: 10 mL via INTRAVENOUS
  Filled 2013-04-18: qty 10

## 2013-04-18 MED ORDER — DIPHENHYDRAMINE HCL 25 MG PO CAPS
50.0000 mg | ORAL_CAPSULE | Freq: Once | ORAL | Status: AC
  Administered 2013-04-18: 50 mg via ORAL

## 2013-04-18 MED ORDER — TRASTUZUMAB CHEMO INJECTION 440 MG
6.0000 mg/kg | Freq: Once | INTRAVENOUS | Status: AC
  Administered 2013-04-18: 462 mg via INTRAVENOUS
  Filled 2013-04-18: qty 22

## 2013-04-18 MED ORDER — SODIUM CHLORIDE 0.9 % IV SOLN
Freq: Once | INTRAVENOUS | Status: AC
  Administered 2013-04-18: 16:00:00 via INTRAVENOUS

## 2013-04-18 MED ORDER — ACETAMINOPHEN 325 MG PO TABS
650.0000 mg | ORAL_TABLET | Freq: Once | ORAL | Status: AC
  Administered 2013-04-18: 650 mg via ORAL

## 2013-04-18 MED ORDER — ACETAMINOPHEN 325 MG PO TABS
ORAL_TABLET | ORAL | Status: AC
  Filled 2013-04-18: qty 2

## 2013-04-18 MED ORDER — HEPARIN SOD (PORK) LOCK FLUSH 100 UNIT/ML IV SOLN
500.0000 [IU] | Freq: Once | INTRAVENOUS | Status: AC
  Administered 2013-04-18: 500 [IU] via INTRAVENOUS
  Filled 2013-04-18: qty 5

## 2013-04-18 NOTE — Patient Instructions (Addendum)
Doing well.  Proceed with Herceptin.  We will see you back on 11/7.  Please call us if you have any questions or concerns.

## 2013-04-18 NOTE — Telephone Encounter (Signed)
Per staff message and POF I have scheduled appts.  JMW  

## 2013-04-18 NOTE — Telephone Encounter (Signed)
, °

## 2013-04-18 NOTE — Patient Instructions (Signed)
[facility] Discharge Instructions for Patients Receiving Chemotherapy  Today you received the following chemotherapy agents:  herceptin  To help prevent nausea and vomiting after your treatment, we encourage you to take your nausea medication.  If you develop nausea and vomiting that is not controlled by your nausea medication, call the clinic.   BELOW ARE SYMPTOMS THAT SHOULD BE REPORTED IMMEDIATELY:  *FEVER GREATER THAN 100.5 F  *CHILLS WITH OR WITHOUT FEVER  NAUSEA AND VOMITING THAT IS NOT CONTROLLED WITH YOUR NAUSEA MEDICATION  *UNUSUAL SHORTNESS OF BREATH  *UNUSUAL BRUISING OR BLEEDING  TENDERNESS IN MOUTH AND THROAT WITH OR WITHOUT PRESENCE OF ULCERS  *URINARY PROBLEMS  *BOWEL PROBLEMS  UNUSUAL RASH Items with * indicate a potential emergency and should be followed up as soon as possible.  Feel free to call the clinic you have any questions or concerns. The clinic phone number is [phone].    

## 2013-04-18 NOTE — Progress Notes (Signed)
OFFICE PROGRESS NOTE  CC  Alicia Pena, [name], MD [address] [place] [address] Dr. Emelia Loron Dr. 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 Originally diagnosed in September 2013.  PRIOR THERAPY:  #1Patient originally presented in September 2013 when she noticed dimpling within the left breast. She subsequently had a mammogram that showed suspicious findings. Further workup showed multiple areas of concern. There was noted to be a 2.8 cm spiculated mass within the upper outer quadrant of the left breast. The pathology did show invasive ductal carcinoma from the upper-outer quadrant of the left breast. There was also noted to be a 9 mm enhancing mass which was 2.2 cm anterior to the known area of malignancy. A third mass measuring 8 mm was 2.1 cm anterior to the known area of malignancy. Also a satellite spiculated mass was present 8 mm and a 4 mm rounded mass in the 12:00. She did have additional biopsies and was diagnosed With multicentric disease. There were also noted to be couple of small areas of concern in the right breast but these were negative for malignancy on biopsy.  #2 patient ultimately proceeded to undergo simple mastectomy on the left with sentinel lymph node biopsy on 03/21/2012. The final pathology within the left breast showed multifocal invasive and in situ ductal carcinoma. 4 distinct areas were seen measuring 3.2 cm, 2.3 cm, 1.3 cm, and 0.8 cm. All margins were negative. There was focal lymphatic vascular involvement and normal involvement by tumor. 2 sentinel nodes were positive for metastatic carcinoma with extracapsular extension. The tumors were ER positive PR positive and HER-2/neu positive.  #3 patient did undergo axillary lymph node dissection of the left axilla On 04/04/2012 additional 8 lymph nodes were removed. I have those 3 were positive therefore patient had 5/10 lymph  nodes positive for metastatic disease. With the final pathologic staging at T2 N2a.  #4 patient was begun on adjuvant systemic chemotherapy by Dr. Pierce Crane consisting of Taxotere carboplatinum given every 21 days with Herceptin given on a weekly basis for the duration of Taxotere and carbo platinum. Her chemotherapy began On 05/03/2012. She received a total of 6 cycles of this.  #5 after completion of chemotherapy she received radiation therapy under the care of Dr. Dorothy Puffer from 10/08/12 through 11/21/12.  #6 She began q 3 week herceptin on 10/11/12.  Tamoxifen added on 11/22/12. She has followed with Dr. Gala Romney closely and her end of July and August Herceptin doses were held due to decreased ejection fraction.  She has since restarted and is followed closely by Dr. Prescott Gum office.    CURRENT THERAPY: every 3 week Herceptin and Tamoxifen  INTERVAL HISTORY: Alicia Pena 42 y.o. female returns for Followup visit prior to her next herceptin dose.   She is doing well today.  She continues to have fatigue.  She is not exercising.  She is taking the tamoxifen daily and is tolerating it essentially well.  She has occasional hot flashes.  She denies fevers, chills, nausea, vomiting, orthopnea, DOE, palpitations, chest pain, or any further concerns.    MEDICAL HISTORY: Past Medical History  Diagnosis Date  . Anemia   . Anxiety     does not like needles ..   . 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 Breast cancer 03/21/2012    left multifocal invasive, in situ ca,margins not involved,ER/PR=+,metastatic (1/1) node  . Allergy   .  Complication of anesthesia   . History of radiation therapy 10/08/2012-11/21/2012    50.4 gray to left chest wall and supraclavicular region    ALLERGIES:  is allergic to duricef.  MEDICATIONS:  Current Outpatient Prescriptions  Medication Sig Dispense Refill  . acetaminophen (TYLENOL) 325 MG tablet Take 650 mg by mouth every 6 (six) hours as needed.      . ALPRAZolam  (XANAX) 0.25 MG tablet Take 1 tablet (0.25 mg total) by mouth at bedtime as needed for sleep.  10 tablet  0  . bisoprolol (ZEBETA) 10 MG tablet Take 1 tablet (10 mg total) by mouth daily.  30 tablet  3  . ibuprofen (ADVIL,MOTRIN) 200 MG tablet Take 200 mg by mouth every 6 (six) hours as needed.      . lidocaine-prilocaine (EMLA) cream Apply topically as needed.  30 g  2  . LORazepam (ATIVAN) 1 MG tablet       . losartan (COZAAR) 25 MG tablet Take 1 tablet (25 mg total) by mouth daily.  30 tablet  3  . Multiple Vitamin (MULTIVITAMIN WITH MINERALS) TABS Take 1 tablet by mouth daily.      . Multiple Vitamins-Iron (ONE-TABLET-DAILY/IRON PO) Take 1 tablet by mouth daily.      . tamoxifen (NOLVADEX) 20 MG tablet Take 1 tablet (20 mg total) by mouth daily.  30 tablet  6  . venlafaxine XR (EFFEXOR-XR) 37.5 MG 24 hr capsule Take 1 capsule (37.5 mg total) by mouth daily.  30 capsule  6   No current facility-administered medications for this visit.    SURGICAL HISTORY:  Past Surgical History  Procedure Laterality Date  . Leg surgery      LLE  . Eye surgery      as a child  . Mastectomy complete / simple w/ sentinel node biopsy  03/21/2012    left Dr.Matthew Dwain Sarna  . Breast biopsy  89/07/2011    left breast uoq bx=invasive ductal ca  . Breast biopsy bi lateral  02/15/12    left = invasive ductal, right breast= fibroadenoma,fibrocystic changes,no atypia,hyperlasia or malignancy identified  . Abdominal hysterectomy  2006    w/o oophorectomy  . Axillary lymph node dissection  04/04/2012    Procedure: AXILLARY LYMPH NODE DISSECTION;  Surgeon: Emelia Loron, MD;  Location: [facility];  Service: General;  Laterality: Left;  Left Axillary lymph node dissection, Port a cath placement  . Portacath placement  04/04/2012    Procedure: INSERTION PORT-A-CATH;  Surgeon: Emelia Loron, MD;  Location: [facility];  Service: General;  Laterality: Right;    REVIEW OF SYSTEMS:    A 10 point review of systems was conducted and is otherwise negative except for what is noted above.    PHYSICAL EXAMINATION: Blood pressure 131/82, pulse 65, temperature 97.6 F (36.4 C), temperature source Oral, resp. rate 20, height 5' (1.524 m), weight 172 lb 14.4 oz (78.427 kg). Body mass index is 33.77 kg/(m^2). General: Patient is a well appearing female in no acute distress HEENT: PERRLA, sclerae anicteric no conjunctival pallor, MMM Neck: supple, no palpable adenopathy Lungs: clear to auscultation bilaterally, no wheezes, rhonchi, or rales Cardiovascular: regular rate rhythm, S1, S2, no murmurs, rubs or gallops Abdomen: Soft, non-tender, non-distended, normoactive bowel sounds, no HSM Extremities: warm and well perfused, no clubbing, cyanosis, or edema Skin: No rashes or lesions Neuro: Non-focal Breasts:  Left mastectomy incision without nodularity, sign of recurrence.  Right breast no nodularity or masses. ECOG PERFORMANCE STATUS:  1 - Symptomatic but completely ambulatory   LABORATORY DATA: Lab Results  Component Value Date   WBC 5.5 04/18/2013   HGB 11.2* 04/18/2013   HCT 35.0 04/18/2013   MCV 79.5 04/18/2013   PLT 169 04/18/2013      Chemistry      Component Value Date/Time   NA 141 03/28/2013 1345   NA 138 04/02/2012 1100   K 4.0 03/28/2013 1345   K 3.9 04/02/2012 1100   CL 109* 12/13/2012 0833   CL 103 04/02/2012 1100   CO2 22 03/28/2013 1345   CO2 25 04/02/2012 1100   BUN 15.8 03/28/2013 1345   BUN 16 04/02/2012 1100   CREATININE 0.7 03/28/2013 1345   CREATININE 0.49* 04/02/2012 1100      Component Value Date/Time   CALCIUM 8.4 03/28/2013 1345   CALCIUM 9.0 04/02/2012 1100   ALKPHOS 72 03/28/2013 1345   AST 17 03/28/2013 1345   ALT 17 03/28/2013 1345   BILITOT 0.37 03/28/2013 1345       RADIOGRAPHIC STUDIES:  No results found.  ASSESSMENT: 42 year old female with  #1 T2 N2 A. Invasive ductal carcinoma of the left breast that was multifocal she is status  post mastectomy with axillary lymph node dissection in September October 2013. Patient's tumor was ER positive PR positive HER-2/neu positive.  #2 she completed adjuvant chemotherapy consisting of Taxotere carboplatinum and Herceptin total of 6 cycles of therapy as planned. She then started every three week herceptin, and received 6 weeks of radiation therapy.    #3 patient had last echocardiogram-9/4, EF was 55% (previously 45-50% on 7/8) Herceptin was put on hold x 2 cycles, and Carvedilol and Losartan were started, Carvedilol was changed to Bisoprolol daily.  Patient f/u with Dr. Gala Romney 9/4 and he increased Bisoprolol to 10mg  daily and continued Losartan 25mg  daily.  She was cleared to proceed with Herceptin and f/u with him in 2 months.    #4 patient is complaining of increasing weakness in her lower extremities. She does have underlying cerebral palsy. She was encouraged to continue to exercise.  #5 Patient is ER PR positive, and was started on Tamoxifen 11/23/12.  Risks and benefits were explained to the patient.  She is s/p TAH, she will get her eye exams annually  PLAN:   #1 Doing well.  Patient will proceed with Herceptin today.  I requested a follow up appointment with Dr. Gala Romney today.  Per his note he requested follow up in early November for echo and evaluation.    #2 She will continue Tamoxifen daily.  She is tolerating it well.    #3 Continues to do well with Effexor XR she will continue this.  #4 She will continue Ferrous Sulfate PO BID.  Her hemoglobin is stable and she is tolerating the medication without difficulty.    #5 Patient will return in 3 weeks for her next Herceptin.    All questions were answered. The patient knows to call the clinic with any problems, questions or concerns. We can certainly see the patient much sooner if necessary.  I spent 25 minutes counseling the patient face to face. The total time spent in the appointment was 30 minutes.  Illa Level, NP Medical Oncology [facility] [phone]

## 2013-05-09 ENCOUNTER — Other Ambulatory Visit (HOSPITAL_BASED_OUTPATIENT_CLINIC_OR_DEPARTMENT_OTHER): Payer: PRIVATE HEALTH INSURANCE

## 2013-05-09 ENCOUNTER — Ambulatory Visit (HOSPITAL_BASED_OUTPATIENT_CLINIC_OR_DEPARTMENT_OTHER): Payer: PRIVATE HEALTH INSURANCE | Admitting: Adult Health

## 2013-05-09 ENCOUNTER — Encounter: Payer: Self-pay | Admitting: Adult Health

## 2013-05-09 ENCOUNTER — Ambulatory Visit (HOSPITAL_BASED_OUTPATIENT_CLINIC_OR_DEPARTMENT_OTHER): Payer: PRIVATE HEALTH INSURANCE

## 2013-05-09 VITALS — BP 110/74 | HR 66 | Temp 98.2°F | Resp 20 | Ht 60.0 in | Wt 172.7 lb

## 2013-05-09 DIAGNOSIS — F411 Generalized anxiety disorder: Secondary | ICD-10-CM

## 2013-05-09 DIAGNOSIS — C50919 Malignant neoplasm of unspecified site of unspecified female breast: Secondary | ICD-10-CM

## 2013-05-09 DIAGNOSIS — Z5112 Encounter for antineoplastic immunotherapy: Secondary | ICD-10-CM

## 2013-05-09 DIAGNOSIS — C773 Secondary and unspecified malignant neoplasm of axilla and upper limb lymph nodes: Secondary | ICD-10-CM

## 2013-05-09 DIAGNOSIS — C50419 Malignant neoplasm of upper-outer quadrant of unspecified female breast: Secondary | ICD-10-CM

## 2013-05-09 DIAGNOSIS — C50912 Malignant neoplasm of unspecified site of left female breast: Secondary | ICD-10-CM

## 2013-05-09 DIAGNOSIS — C50412 Malignant neoplasm of upper-outer quadrant of left female breast: Secondary | ICD-10-CM

## 2013-05-09 DIAGNOSIS — R29898 Other symptoms and signs involving the musculoskeletal system: Secondary | ICD-10-CM

## 2013-05-09 LAB — CBC WITH DIFFERENTIAL/PLATELET
BASO%: 0.5 % (ref 0.0–2.0)
Basophils Absolute: 0 10*3/uL (ref 0.0–0.1)
EOS%: 2.5 % (ref 0.0–7.0)
HCT: 36.3 % (ref 34.8–46.6)
LYMPH%: 24.5 % (ref 14.0–49.7)
MCH: 26.1 pg (ref 25.1–34.0)
MCHC: 32 g/dL (ref 31.5–36.0)
MCV: 81.6 fL (ref 79.5–101.0)
MONO%: 7.1 % (ref 0.0–14.0)
NEUT%: 65.4 % (ref 38.4–76.8)
Platelets: 186 10*3/uL (ref 145–400)

## 2013-05-09 LAB — COMPREHENSIVE METABOLIC PANEL
ALT: 20 U/L (ref 0–35)
AST: 18 U/L (ref 0–37)
BUN: 19 mg/dL (ref 6–23)
Creatinine, Ser: 0.57 mg/dL (ref 0.50–1.10)
Total Bilirubin: 0.2 mg/dL — ABNORMAL LOW (ref 0.3–1.2)

## 2013-05-09 MED ORDER — SODIUM CHLORIDE 0.9 % IV SOLN
Freq: Once | INTRAVENOUS | Status: AC
  Administered 2013-05-09: 16:00:00 via INTRAVENOUS

## 2013-05-09 MED ORDER — ACETAMINOPHEN 325 MG PO TABS
ORAL_TABLET | ORAL | Status: AC
  Filled 2013-05-09: qty 2

## 2013-05-09 MED ORDER — DIPHENHYDRAMINE HCL 25 MG PO CAPS
ORAL_CAPSULE | ORAL | Status: AC
  Filled 2013-05-09: qty 2

## 2013-05-09 MED ORDER — HEPARIN SOD (PORK) LOCK FLUSH 100 UNIT/ML IV SOLN
500.0000 [IU] | Freq: Once | INTRAVENOUS | Status: AC | PRN
  Administered 2013-05-09: 500 [IU]
  Filled 2013-05-09: qty 5

## 2013-05-09 MED ORDER — SODIUM CHLORIDE 0.9 % IJ SOLN
10.0000 mL | INTRAMUSCULAR | Status: DC | PRN
  Administered 2013-05-09: 10 mL
  Filled 2013-05-09: qty 10

## 2013-05-09 MED ORDER — ACETAMINOPHEN 325 MG PO TABS
650.0000 mg | ORAL_TABLET | Freq: Once | ORAL | Status: AC
  Administered 2013-05-09: 650 mg via ORAL

## 2013-05-09 MED ORDER — TAMOXIFEN CITRATE 20 MG PO TABS: 20.0000 mg | ORAL_TABLET | Freq: Every day | ORAL | Status: DC

## 2013-05-09 MED ORDER — TRASTUZUMAB CHEMO INJECTION 440 MG
6.0000 mg/kg | Freq: Once | INTRAVENOUS | Status: AC
  Administered 2013-05-09: 462 mg via INTRAVENOUS
  Filled 2013-05-09: qty 22

## 2013-05-09 MED ORDER — DIPHENHYDRAMINE HCL 25 MG PO CAPS
50.0000 mg | ORAL_CAPSULE | Freq: Once | ORAL | Status: AC
  Administered 2013-05-09: 50 mg via ORAL

## 2013-05-09 MED ORDER — VENLAFAXINE HCL ER 75 MG PO CP24: 75.0000 mg | ORAL_CAPSULE | Freq: Every day | ORAL | Status: DC

## 2013-05-09 NOTE — Patient Instructions (Signed)
[facility] Discharge Instructions for Patients Receiving Chemotherapy  Today you received the following chemotherapy agents:  herceptin  To help prevent nausea and vomiting after your treatment, we encourage you to take your nausea medication.  If you develop nausea and vomiting that is not controlled by your nausea medication, call the clinic.   BELOW ARE SYMPTOMS THAT SHOULD BE REPORTED IMMEDIATELY:  *FEVER GREATER THAN 100.5 F  *CHILLS WITH OR WITHOUT FEVER  NAUSEA AND VOMITING THAT IS NOT CONTROLLED WITH YOUR NAUSEA MEDICATION  *UNUSUAL SHORTNESS OF BREATH  *UNUSUAL BRUISING OR BLEEDING  TENDERNESS IN MOUTH AND THROAT WITH OR WITHOUT PRESENCE OF ULCERS  *URINARY PROBLEMS  *BOWEL PROBLEMS  UNUSUAL RASH Items with * indicate a potential emergency and should be followed up as soon as possible.  Feel free to call the clinic you have any questions or concerns. The clinic phone number is [phone].    

## 2013-05-09 NOTE — Progress Notes (Signed)
OFFICE PROGRESS NOTE  CC  [name], Alicia Richards, MD [address] [place] [address] Dr. Emelia Loron Dr. 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 Originally diagnosed in September 2013.  PRIOR THERAPY:  #1Patient originally presented in September 2013 when she noticed dimpling within the left breast. She subsequently had a mammogram that showed suspicious findings. Further workup showed multiple areas of concern. There was noted to be a 2.8 cm spiculated mass within the upper outer quadrant of the left breast. The pathology did show invasive ductal carcinoma from the upper-outer quadrant of the left breast. There was also noted to be a 9 mm enhancing mass which was 2.2 cm anterior to the known area of malignancy. A third mass measuring 8 mm was 2.1 cm anterior to the known area of malignancy. Also a satellite spiculated mass was present 8 mm and a 4 mm rounded mass in the 12:00. She did have additional biopsies and was diagnosed With multicentric disease. There were also noted to be couple of small areas of concern in the right breast but these were negative for malignancy on biopsy.  #2 patient ultimately proceeded to undergo simple mastectomy on the left with sentinel lymph node biopsy on 03/21/2012. The final pathology within the left breast showed multifocal invasive and in situ ductal carcinoma. 4 distinct areas were seen measuring 3.2 cm, 2.3 cm, 1.3 cm, and 0.8 cm. All margins were negative. There was focal lymphatic vascular involvement and normal involvement by tumor. 2 sentinel nodes were positive for metastatic carcinoma with extracapsular extension. The tumors were ER positive PR positive and HER-2/neu positive.  #3 patient did undergo axillary lymph node dissection of the left axilla On 04/04/2012 additional 8 lymph nodes were removed. I have those 3 were positive therefore patient had 5/10 lymph  nodes positive for metastatic disease. With the final pathologic staging at T2 N2a.  #4 patient was begun on adjuvant systemic chemotherapy by Dr. Pierce Crane consisting of Taxotere carboplatinum given every 21 days with Herceptin given on a weekly basis for the duration of Taxotere and carbo platinum. Her chemotherapy began On 05/03/2012. She received a total of 6 cycles of this.  #5 after completion of chemotherapy she received radiation therapy under the care of Dr. Dorothy Puffer from 10/08/12 through 11/21/12.  #6 She began q 3 week herceptin on 10/11/12.  Tamoxifen added on 11/22/12. She has followed with Dr. Gala Romney closely and her end of July and August Herceptin doses were held due to decreased ejection fraction.  She has since restarted and is followed closely by Dr. Prescott Gum office.    CURRENT THERAPY: every 3 week Herceptin and Tamoxifen  INTERVAL HISTORY: Alicia Pena 42 y.o. female returns for Followup visit prior to her next herceptin dose. Her hot flashes have increased lately and she wants to know if we can increase her Effexor dose.  Otherwise, she denies DOE, orthopnea, swelling, or any further concerns.  A 10 point ROS is otherwise negative.    MEDICAL HISTORY: Past Medical History  Diagnosis Date  . Anemia   . Anxiety     does not like needles ..   . 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 Breast cancer 03/21/2012    left multifocal invasive, in situ ca,margins not involved,ER/PR=+,metastatic (1/1) node  . Allergy   . Complication of anesthesia   . History of radiation therapy 10/08/2012-11/21/2012    50.4 gray  to left chest wall and supraclavicular region    ALLERGIES:  is allergic to duricef.  MEDICATIONS:  Current Outpatient Prescriptions  Medication Sig Dispense Refill  . acetaminophen (TYLENOL) 325 MG tablet Take 650 mg by mouth every 6 (six) hours as needed.      . ALPRAZolam (XANAX) 0.25 MG tablet Take 1 tablet (0.25 mg total) by mouth at bedtime as needed for  sleep.  10 tablet  0  . bisoprolol (ZEBETA) 10 MG tablet Take 1 tablet (10 mg total) by mouth daily.  30 tablet  3  . ibuprofen (ADVIL,MOTRIN) 200 MG tablet Take 200 mg by mouth every 6 (six) hours as needed.      . lidocaine-prilocaine (EMLA) cream Apply topically as needed.  30 g  2  . LORazepam (ATIVAN) 1 MG tablet       . losartan (COZAAR) 25 MG tablet Take 1 tablet (25 mg total) by mouth daily.  30 tablet  3  . Multiple Vitamin (MULTIVITAMIN WITH MINERALS) TABS Take 1 tablet by mouth daily.      . Multiple Vitamins-Iron (ONE-TABLET-DAILY/IRON PO) Take 1 tablet by mouth daily.      . tamoxifen (NOLVADEX) 20 MG tablet Take 1 tablet (20 mg total) by mouth daily.  90 tablet  12  . venlafaxine XR (EFFEXOR-XR) 75 MG 24 hr capsule Take 1 capsule (75 mg total) by mouth daily with breakfast.  30 capsule  12   No current facility-administered medications for this visit.    SURGICAL HISTORY:  Past Surgical History  Procedure Laterality Date  . Leg surgery      LLE  . Eye surgery      as a child  . Mastectomy complete / simple w/ sentinel node biopsy  03/21/2012    left Dr.Matthew Dwain Sarna  . Breast biopsy  89/07/2011    left breast uoq bx=invasive ductal ca  . Breast biopsy bi lateral  02/15/12    left = invasive ductal, right breast= fibroadenoma,fibrocystic changes,no atypia,hyperlasia or malignancy identified  . Abdominal hysterectomy  2006    w/o oophorectomy  . Axillary lymph node dissection  04/04/2012    Procedure: AXILLARY LYMPH NODE DISSECTION;  Surgeon: Emelia Loron, MD;  Location: [facility];  Service: General;  Laterality: Left;  Left Axillary lymph node dissection, Port a cath placement  . Portacath placement  04/04/2012    Procedure: INSERTION PORT-A-CATH;  Surgeon: Emelia Loron, MD;  Location: [facility];  Service: General;  Laterality: Right;    REVIEW OF SYSTEMS:   A 10 point review of systems was conducted and is otherwise negative  except for what is noted above.    PHYSICAL EXAMINATION: Blood pressure 110/74, pulse 66, temperature 98.2 F (36.8 C), temperature source Oral, resp. rate 20, height 5' (1.524 m), weight 172 lb 11.2 oz (78.336 kg). Body mass index is 33.73 kg/(m^2). General: Patient is a well appearing female in no acute distress HEENT: PERRLA, sclerae anicteric no conjunctival pallor, MMM Neck: supple, no palpable adenopathy Lungs: clear to auscultation bilaterally, no wheezes, rhonchi, or rales Cardiovascular: regular rate rhythm, S1, S2, no murmurs, rubs or gallops Abdomen: Soft, non-tender, non-distended, normoactive bowel sounds, no HSM Extremities: warm and well perfused, no clubbing, cyanosis, or edema Skin: No rashes or lesions Neuro: Non-focal Breasts:  Left mastectomy incision without nodularity, sign of recurrence.  Right breast no nodularity or masses. ECOG PERFORMANCE STATUS: 1 - Symptomatic but completely ambulatory   LABORATORY DATA: Lab Results  Component  Value Date   WBC 6.1 05/09/2013   HGB 11.6 05/09/2013   HCT 36.3 05/09/2013   MCV 81.6 05/09/2013   PLT 186 05/09/2013      Chemistry      Component Value Date/Time   NA 144 04/18/2013 1350   NA 138 04/02/2012 1100   K 3.9 04/18/2013 1350   K 3.9 04/02/2012 1100   CL 109* 12/13/2012 0833   CL 103 04/02/2012 1100   CO2 24 04/18/2013 1350   CO2 25 04/02/2012 1100   BUN 17.3 04/18/2013 1350   BUN 16 04/02/2012 1100   CREATININE 0.7 04/18/2013 1350   CREATININE 0.49* 04/02/2012 1100      Component Value Date/Time   CALCIUM 8.4 04/18/2013 1350   CALCIUM 9.0 04/02/2012 1100   ALKPHOS 67 04/18/2013 1350   AST 18 04/18/2013 1350   ALT 21 04/18/2013 1350   BILITOT 0.30 04/18/2013 1350       RADIOGRAPHIC STUDIES:  No results found.  ASSESSMENT: 42 year old female with  #1 T2 N2 A. Invasive ductal carcinoma of the left breast that was multifocal she is status post mastectomy with axillary lymph node dissection in September  October 2013. Patient's tumor was ER positive PR positive HER-2/neu positive.  #2 she completed adjuvant chemotherapy consisting of Taxotere carboplatinum and Herceptin total of 6 cycles of therapy as planned. She then started every three week herceptin, and received 6 weeks of radiation therapy.    #3 patient had last echocardiogram-9/4, EF was 55% (previously 45-50% on 7/8) Herceptin was put on hold x 2 cycles, and Carvedilol and Losartan were started, Carvedilol was changed to Bisoprolol daily.  Patient f/u with Dr. Gala Romney 9/4 and he increased Bisoprolol to 10mg  daily and continued Losartan 25mg  daily.  She was cleared to proceed with Herceptin and f/u with him in 2 months.    #4 patient is complaining of increasing weakness in her lower extremities. She does have underlying cerebral palsy. She was encouraged to continue to exercise.  #5 Patient is ER PR positive, and was started on Tamoxifen 11/23/12.  Risks and benefits were explained to the patient.  She is s/p TAH, she will get her eye exams annually  PLAN:   #1 Doing well.  Patient will proceed with Herceptin today. She will follow up with Dr. Gala Romney on Monday, 05/12/13.    #2 She will continue Tamoxifen daily.  She is tolerating it well.    #3 Continues to do well with Effexor XR she will continue this. I increased the dose to 75 mg daily.    #4 She will continue Ferrous Sulfate PO BID.  Her hemoglobin is stable and she is tolerating the medication without difficulty.    #5 Patient will return in 3 weeks for her next Herceptin.    All questions were answered. The patient knows to call the clinic with any problems, questions or concerns. We can certainly see the patient much sooner if necessary.  I spent 25 minutes counseling the patient face to face. The total time spent in the appointment was 30 minutes.  Illa Level, NP Medical Oncology [facility] [phone]

## 2013-05-12 ENCOUNTER — Ambulatory Visit (HOSPITAL_BASED_OUTPATIENT_CLINIC_OR_DEPARTMENT_OTHER)
Admission: RE | Admit: 2013-05-12 | Discharge: 2013-05-12 | Disposition: A | Discharge status: Home / Self Care | Payer: PRIVATE HEALTH INSURANCE | Source: Ambulatory Visit | Attending: Internal Medicine | Admitting: Internal Medicine

## 2013-05-12 ENCOUNTER — Ambulatory Visit (HOSPITAL_COMMUNITY)
Admission: RE | Admit: 2013-05-12 | Discharge: 2013-05-12 | Disposition: A | Discharge status: Home / Self Care | Payer: PRIVATE HEALTH INSURANCE | Source: Ambulatory Visit | Attending: Family Medicine | Admitting: Family Medicine

## 2013-05-12 VITALS — BP 122/68 | HR 50 | Wt 173.2 lb

## 2013-05-12 DIAGNOSIS — C50919 Malignant neoplasm of unspecified site of unspecified female breast: Secondary | ICD-10-CM

## 2013-05-12 DIAGNOSIS — I079 Rheumatic tricuspid valve disease, unspecified: Secondary | ICD-10-CM | POA: Insufficient documentation

## 2013-05-12 DIAGNOSIS — Z09 Encounter for follow-up examination after completed treatment for conditions other than malignant neoplasm: Secondary | ICD-10-CM

## 2013-05-12 DIAGNOSIS — I379 Nonrheumatic pulmonary valve disorder, unspecified: Secondary | ICD-10-CM | POA: Insufficient documentation

## 2013-05-12 DIAGNOSIS — I429 Cardiomyopathy, unspecified: Secondary | ICD-10-CM | POA: Insufficient documentation

## 2013-05-12 DIAGNOSIS — R001 Bradycardia, unspecified: Secondary | ICD-10-CM

## 2013-05-12 DIAGNOSIS — I059 Rheumatic mitral valve disease, unspecified: Secondary | ICD-10-CM | POA: Insufficient documentation

## 2013-05-12 DIAGNOSIS — I498 Other specified cardiac arrhythmias: Secondary | ICD-10-CM | POA: Insufficient documentation

## 2013-05-12 DIAGNOSIS — Z5189 Encounter for other specified aftercare: Secondary | ICD-10-CM

## 2013-05-12 MED ORDER — BISOPROLOL FUMARATE 10 MG PO TABS: 5.0000 mg | ORAL_TABLET | Freq: Every day | ORAL | Status: DC

## 2013-05-12 NOTE — Progress Notes (Signed)
Patient ID: Alicia Pena, female   DOB: 10-07-1970, 42 y.o.   MRN: [id number]  Oncologist: Dr. 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   She was treated with taxotere and carboplatinum. Now on herceptin q 3 weeks. Began on 05/03/2012. Plan to continue Herceptin until December 2014. Herceptin was stopped in July due to early cardiotoxicity.   ECHO 04/09/2012 EF 55-60% lateral S' ECHO 07/17/2012 EF 55-65% lateral S' 10.1 ECHO 10/04/12 EF 55% lateral S' 10.0 Global Strain -15 ECHO 01/07/13 EF ~45-50% Lateral S' 9.7 Global Stain -11 ECHO 03/06/13  55% Lateral S' 10.0 Global Strain -16.9 ECHO 05/12/13 EF 55-60% lateral s' 10.3 Global Strain -21.7   She returns for follow up. She is back on Herceptin. She has two rounds left of herceptin.  Last visit bisoprolol increased to 10 mg daily. Complains of fatigue after increasing bisoprolol. Denies SOB/PND/Orthopnea. Working full time.    Past Medical History  Diagnosis Date  . Anemia   . Anxiety     does not like needles ..   . 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 Breast cancer 03/21/2012    left multifocal invasive, in situ ca,margins not involved,ER/PR=+,metastatic (1/1) node  . Allergy   . Complication of anesthesia   . History of radiation therapy 10/08/2012-11/21/2012    50.4 gray to left chest wall and supraclavicular region    Current Outpatient Prescriptions  Medication Sig Dispense Refill  . acetaminophen (TYLENOL) 325 MG tablet Take 650 mg by mouth every 6 (six) hours as needed.      . ALPRAZolam (XANAX) 0.25 MG tablet Take 1 tablet (0.25 mg total) by mouth at bedtime as needed for sleep.  10 tablet  0  . bisoprolol (ZEBETA) 10 MG tablet Take 1 tablet (10 mg total) by mouth daily.  30 tablet  3  . ibuprofen (ADVIL,MOTRIN) 200 MG tablet Take 200 mg by mouth  every 6 (six) hours as needed.      . lidocaine-prilocaine (EMLA) cream Apply topically as needed.  30 g  2  . LORazepam (ATIVAN) 1 MG tablet       . losartan (COZAAR) 25 MG tablet Take 1 tablet (25 mg total) by mouth daily.  30 tablet  3  . Multiple Vitamin (MULTIVITAMIN WITH MINERALS) TABS Take 1 tablet by mouth daily.      . Multiple Vitamins-Iron (ONE-TABLET-DAILY/IRON PO) Take 1 tablet by mouth daily.      . tamoxifen (NOLVADEX) 20 MG tablet Take 1 tablet (20 mg total) by mouth daily.  90 tablet  12  . venlafaxine XR (EFFEXOR-XR) 75 MG 24 hr capsule Take 1 capsule (75 mg total) by mouth daily with breakfast.  30 capsule  12   No current facility-administered medications for this encounter.     Allergies  Allergen Reactions  . Duricef [Cefadroxil] Hives and Nausea Only    History   Social History  . Marital Status: Single    Spouse Name: N/A    Number of Children: N/A  . Years of Education: N/A   Occupational History  . Not on file.   Social History Main Topics  . Smoking status: Never Smoker   . Smokeless tobacco: Never Used  . Alcohol Use: No     Comment: used birth control pills 6 months,G)PO  .  Drug Use: No  . Sexual Activity: No   Other Topics Concern  . Not on file   Social History Narrative  . No narrative on file    Family History  Problem Relation Age of Onset  . Cancer Father 20    lung  . Diabetes Maternal Grandmother   . Hyperlipidemia Maternal Grandmother   . Hypertension Maternal Grandmother     PHYSICAL EXAM: Filed Vitals:   05/12/13 1355  BP: 122/68  Pulse: 50  Weight: 173 lb 4 oz (78.586 kg)  SpO2: 98%   General:  Well appearing. No respiratory difficulty Ambulated into clinic with a cane.  HEENT: normal Neck: supple. no JVD. Carotids 2+ bilat; no bruits. No lymphadenopathy or thryomegaly appreciated. Cor: PMI nondisplaced. Regular rate & rhythm. No rubs, gallops or murmurs. Lungs: clear Abdomen: soft, nontender, nondistended. No  hepatosplenomegaly. No bruits or masses. Good bowel sounds. Extremities: no cyanosis, clubbing, rash, no edema. Walks with walker due to CP. R and LLE trace edema Neuro: alert & oriented x 3, cranial nerves grossly intact. moves all 4 extremities w/o difficulty. Affect pleasant.  EKG: Sinus Bradycardia 51 bpm.    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 Breast CA, as above 3. Cerebral palsy 4. Bradycardia  PLAN: HR down 51. Cut back bisoprolol to 5 mg daily. Continue losartan 25 mg daily.  Follow up the end of December for ECHO.   CLEGG,AMYNP-C  Patient seen and examined with Tonye Becket, NP. We discussed all aspects of the encounter. I agree with the assessment and plan as stated above.   I reviewed echos personally. EF and Doppler parameters stable. No HF on exam. Continue Herceptin. Agree with cutting bisoprolol back due to bradycardia.   Aiana Nordquist,MD 11:15 AM

## 2013-05-12 NOTE — Progress Notes (Signed)
*  PRELIMINARY RESULTS* Echocardiogram 2D Echocardiogram has been performed.  Jeryl Columbia 05/12/2013, 2:07 PM

## 2013-05-12 NOTE — Patient Instructions (Signed)
Follow up the end of December.   Take 5 mg bisoprolol daily

## 2013-05-13 ENCOUNTER — Other Ambulatory Visit (HOSPITAL_COMMUNITY): Payer: Self-pay | Admitting: Adult Health

## 2013-05-30 ENCOUNTER — Ambulatory Visit (HOSPITAL_BASED_OUTPATIENT_CLINIC_OR_DEPARTMENT_OTHER): Payer: PRIVATE HEALTH INSURANCE

## 2013-05-30 ENCOUNTER — Ambulatory Visit (HOSPITAL_BASED_OUTPATIENT_CLINIC_OR_DEPARTMENT_OTHER): Payer: PRIVATE HEALTH INSURANCE | Admitting: Adult Health

## 2013-05-30 ENCOUNTER — Encounter: Payer: Self-pay | Admitting: Adult Health

## 2013-05-30 ENCOUNTER — Other Ambulatory Visit (HOSPITAL_BASED_OUTPATIENT_CLINIC_OR_DEPARTMENT_OTHER): Payer: PRIVATE HEALTH INSURANCE | Admitting: Lab

## 2013-05-30 VITALS — BP 129/83 | HR 67 | Temp 98.7°F | Resp 18 | Ht 60.0 in | Wt 173.3 lb

## 2013-05-30 DIAGNOSIS — C50419 Malignant neoplasm of upper-outer quadrant of unspecified female breast: Secondary | ICD-10-CM

## 2013-05-30 DIAGNOSIS — C50912 Malignant neoplasm of unspecified site of left female breast: Secondary | ICD-10-CM

## 2013-05-30 DIAGNOSIS — Z5112 Encounter for antineoplastic immunotherapy: Secondary | ICD-10-CM

## 2013-05-30 DIAGNOSIS — R5381 Other malaise: Secondary | ICD-10-CM

## 2013-05-30 DIAGNOSIS — C50919 Malignant neoplasm of unspecified site of unspecified female breast: Secondary | ICD-10-CM

## 2013-05-30 DIAGNOSIS — R5383 Other fatigue: Secondary | ICD-10-CM

## 2013-05-30 DIAGNOSIS — C773 Secondary and unspecified malignant neoplasm of axilla and upper limb lymph nodes: Secondary | ICD-10-CM

## 2013-05-30 DIAGNOSIS — C50412 Malignant neoplasm of upper-outer quadrant of left female breast: Secondary | ICD-10-CM

## 2013-05-30 DIAGNOSIS — Z170 Estrogen receptor positive status [ER+]: Secondary | ICD-10-CM

## 2013-05-30 LAB — CBC WITH DIFFERENTIAL/PLATELET
BASO%: 0.7 % (ref 0.0–2.0)
Basophils Absolute: 0 10e3/uL (ref 0.0–0.1)
EOS%: 2.5 % (ref 0.0–7.0)
Eosinophils Absolute: 0.1 10e3/uL (ref 0.0–0.5)
HCT: 36.8 % (ref 34.8–46.6)
HGB: 12 g/dL (ref 11.6–15.9)
LYMPH%: 25 % (ref 14.0–49.7)
MCH: 26.8 pg (ref 25.1–34.0)
MCHC: 32.6 g/dL (ref 31.5–36.0)
MCV: 82.3 fL (ref 79.5–101.0)
MONO#: 0.3 10e3/uL (ref 0.1–0.9)
MONO%: 5.6 % (ref 0.0–14.0)
NEUT#: 3.7 10e3/uL (ref 1.5–6.5)
NEUT%: 66.2 % (ref 38.4–76.8)
Platelets: 172 10e3/uL (ref 145–400)
RBC: 4.47 10e6/uL (ref 3.70–5.45)
RDW: 16.6 % — ABNORMAL HIGH (ref 11.2–14.5)
WBC: 5.5 10e3/uL (ref 3.9–10.3)
lymph#: 1.4 10e3/uL (ref 0.9–3.3)
nRBC: 0 % (ref 0–0)

## 2013-05-30 LAB — COMPREHENSIVE METABOLIC PANEL (CC13)
ALT: 18 U/L (ref 0–55)
AST: 17 U/L (ref 5–34)
Albumin: 3.2 g/dL — ABNORMAL LOW (ref 3.5–5.0)
Alkaline Phosphatase: 68 U/L (ref 40–150)
Anion Gap: 10 meq/L (ref 3–11)
BUN: 19 mg/dL (ref 7.0–26.0)
CO2: 23 meq/L (ref 22–29)
Calcium: 8.4 mg/dL (ref 8.4–10.4)
Chloride: 110 meq/L — ABNORMAL HIGH (ref 98–109)
Creatinine: 0.8 mg/dL (ref 0.6–1.1)
Glucose: 105 mg/dL (ref 70–140)
Potassium: 3.9 meq/L (ref 3.5–5.1)
Sodium: 143 meq/L (ref 136–145)
Total Bilirubin: 0.36 mg/dL (ref 0.20–1.20)
Total Protein: 6.6 g/dL (ref 6.4–8.3)

## 2013-05-30 MED ORDER — ACETAMINOPHEN 325 MG PO TABS
ORAL_TABLET | ORAL | Status: AC
  Filled 2013-05-30: qty 2

## 2013-05-30 MED ORDER — SODIUM CHLORIDE 0.9 % IV SOLN
Freq: Once | INTRAVENOUS | Status: AC
  Administered 2013-05-30: 16:00:00 via INTRAVENOUS

## 2013-05-30 MED ORDER — DIPHENHYDRAMINE HCL 25 MG PO CAPS
50.0000 mg | ORAL_CAPSULE | Freq: Once | ORAL | Status: AC
  Administered 2013-05-30: 50 mg via ORAL

## 2013-05-30 MED ORDER — ACETAMINOPHEN 325 MG PO TABS
650.0000 mg | ORAL_TABLET | Freq: Once | ORAL | Status: AC
  Administered 2013-05-30: 650 mg via ORAL

## 2013-05-30 MED ORDER — DIPHENHYDRAMINE HCL 25 MG PO CAPS
ORAL_CAPSULE | ORAL | Status: AC
  Filled 2013-05-30: qty 2

## 2013-05-30 MED ORDER — SODIUM CHLORIDE 0.9 % IJ SOLN
10.0000 mL | INTRAMUSCULAR | Status: DC | PRN
  Administered 2013-05-30: 10 mL
  Filled 2013-05-30: qty 10

## 2013-05-30 MED ORDER — HEPARIN SOD (PORK) LOCK FLUSH 100 UNIT/ML IV SOLN
500.0000 [IU] | Freq: Once | INTRAVENOUS | Status: AC | PRN
  Administered 2013-05-30: 500 [IU]
  Filled 2013-05-30: qty 5

## 2013-05-30 MED ORDER — TRASTUZUMAB CHEMO INJECTION 440 MG
6.0000 mg/kg | Freq: Once | INTRAVENOUS | Status: AC
  Administered 2013-05-30: 462 mg via INTRAVENOUS
  Filled 2013-05-30: qty 22

## 2013-05-30 NOTE — Patient Instructions (Signed)
[facility] Discharge Instructions for Patients Receiving Chemotherapy  Today you received the following chemotherapy agents :  Herceptin.  To help prevent nausea and vomiting after your treatment, we encourage you to take your nausea medication as instructed by your physician.   If you develop nausea and vomiting that is not controlled by your nausea medication, call the clinic.   BELOW ARE SYMPTOMS THAT SHOULD BE REPORTED IMMEDIATELY:  *FEVER GREATER THAN 100.5 F  *CHILLS WITH OR WITHOUT FEVER  NAUSEA AND VOMITING THAT IS NOT CONTROLLED WITH YOUR NAUSEA MEDICATION  *UNUSUAL SHORTNESS OF BREATH  *UNUSUAL BRUISING OR BLEEDING  TENDERNESS IN MOUTH AND THROAT WITH OR WITHOUT PRESENCE OF ULCERS  *URINARY PROBLEMS  *BOWEL PROBLEMS  UNUSUAL RASH Items with * indicate a potential emergency and should be followed up as soon as possible.  Feel free to call the clinic you have any questions or concerns. The clinic phone number is [phone].    

## 2013-05-30 NOTE — Progress Notes (Signed)
OFFICE PROGRESS NOTE  CC  Alicia Pena, Alicia Richards, MD [address] [place] [address] Dr. Emelia Loron Dr. 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 Originally diagnosed in September 2013.  PRIOR THERAPY:  #1Patient originally presented in September 2013 when she noticed dimpling within the left breast. She subsequently had a mammogram that showed suspicious findings. Further workup showed multiple areas of concern. There was noted to be a 2.8 cm spiculated mass within the upper outer quadrant of the left breast. The pathology did show invasive ductal carcinoma from the upper-outer quadrant of the left breast. There was also noted to be a 9 mm enhancing mass which was 2.2 cm anterior to the known area of malignancy. A third mass measuring 8 mm was 2.1 cm anterior to the known area of malignancy. Also a satellite spiculated mass was present 8 mm and a 4 mm rounded mass in the 12:00. She did have additional biopsies and was diagnosed With multicentric disease. There were also noted to be couple of small areas of concern in the right breast but these were negative for malignancy on biopsy.  #2 patient ultimately proceeded to undergo simple mastectomy on the left with sentinel lymph node biopsy on 03/21/2012. The final pathology within the left breast showed multifocal invasive and in situ ductal carcinoma. 4 distinct areas were seen measuring 3.2 cm, 2.3 cm, 1.3 cm, and 0.8 cm. All margins were negative. There was focal lymphatic vascular involvement and normal involvement by tumor. 2 sentinel nodes were positive for metastatic carcinoma with extracapsular extension. The tumors were ER positive PR positive and HER-2/neu positive.  #3 patient did undergo axillary lymph node dissection of the left axilla On 04/04/2012 additional 8 lymph nodes were removed. I have those 3 were positive therefore patient had 5/10 lymph  nodes positive for metastatic disease. With the final pathologic staging at T2 N2a.  #4 patient was begun on adjuvant systemic chemotherapy by Dr. Pierce Crane consisting of Taxotere carboplatinum given every 21 days with Herceptin given on a weekly basis for the duration of Taxotere and carbo platinum. Her chemotherapy began On 05/03/2012. She received a total of 6 cycles of this.  #5 after completion of chemotherapy she received radiation therapy under the care of Dr. Dorothy Puffer from 10/08/12 through 11/21/12.  #6 She began q 3 week herceptin on 10/11/12.  Tamoxifen added on 11/22/12. She has followed with Dr. Gala Romney closely and her end of July and August Herceptin doses were held due to decreased ejection fraction.  She has since restarted and is followed closely by Dr. Prescott Gum office.    CURRENT THERAPY: every 3 week Herceptin and Tamoxifen  INTERVAL HISTORY: Yanice Maqueda 42 y.o. female returns for Followup visit prior to her next herceptin dose.  She is doing well today.  She is fatigued, and experiencing hot flashes with taking the Tamoxifen.  She denies pain, fevers, chills, nausea, vomiting, constipation, diarrhea, numbness or further concerns.  She had f/u with Dr. Gala Romney on 05/12/13.  She was cleared to continue Herceptin therapy.   MEDICAL HISTORY: Past Medical History  Diagnosis Date  . Anemia   . Anxiety     does not like needles ..   . 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 Breast cancer 03/21/2012    left multifocal invasive, in situ ca,margins not involved,ER/PR=+,metastatic (1/1) node  . Allergy   . Complication of anesthesia   .  History of radiation therapy 10/08/2012-11/21/2012    50.4 gray to left chest wall and supraclavicular region    ALLERGIES:  is allergic to duricef.  MEDICATIONS:  Current Outpatient Prescriptions  Medication Sig Dispense Refill  . acetaminophen (TYLENOL) 325 MG tablet Take 650 mg by mouth every 6 (six) hours as needed.      . bisoprolol  (ZEBETA) 10 MG tablet Take 0.5 tablets (5 mg total) by mouth daily.  15 tablet  3  . ibuprofen (ADVIL,MOTRIN) 200 MG tablet Take 200 mg by mouth every 6 (six) hours as needed.      . lidocaine-prilocaine (EMLA) cream Apply topically as needed.  30 g  2  . losartan (COZAAR) 25 MG tablet TAKE 1 TABLET BY MOUTH EVERY DAY  30 tablet  6  . Multiple Vitamin (MULTIVITAMIN WITH MINERALS) TABS Take 1 tablet by mouth daily.      . Multiple Vitamins-Iron (ONE-TABLET-DAILY/IRON PO) Take 1 tablet by mouth daily.      . tamoxifen (NOLVADEX) 20 MG tablet Take 1 tablet (20 mg total) by mouth daily.  90 tablet  12  . venlafaxine XR (EFFEXOR-XR) 75 MG 24 hr capsule Take 1 capsule (75 mg total) by mouth daily with breakfast.  30 capsule  12  . ALPRAZolam (XANAX) 0.25 MG tablet Take 1 tablet (0.25 mg total) by mouth at bedtime as needed for sleep.  10 tablet  0  . LORazepam (ATIVAN) 1 MG tablet        No current facility-administered medications for this visit.    SURGICAL HISTORY:  Past Surgical History  Procedure Laterality Date  . Leg surgery      LLE  . Eye surgery      as a child  . Mastectomy complete / simple w/ sentinel node biopsy  03/21/2012    left Dr.Matthew Dwain Sarna  . Breast biopsy  89/07/2011    left breast uoq bx=invasive ductal ca  . Breast biopsy bi lateral  02/15/12    left = invasive ductal, right breast= fibroadenoma,fibrocystic changes,no atypia,hyperlasia or malignancy identified  . Abdominal hysterectomy  2006    w/o oophorectomy  . Axillary lymph node dissection  04/04/2012    Procedure: AXILLARY LYMPH NODE DISSECTION;  Surgeon: Emelia Loron, MD;  Location: [facility];  Service: General;  Laterality: Left;  Left Axillary lymph node dissection, Port a cath placement  . Portacath placement  04/04/2012    Procedure: INSERTION PORT-A-CATH;  Surgeon: Emelia Loron, MD;  Location: [facility];  Service: General;  Laterality: Right;    REVIEW OF  SYSTEMS:   A 10 point review of systems was conducted and is otherwise negative except for what is noted above.    PHYSICAL EXAMINATION: Blood pressure 129/83, pulse 67, temperature 98.7 F (37.1 C), temperature source Oral, resp. rate 18, height 5' (1.524 m), weight 173 lb 4.8 oz (78.608 kg). Body mass index is 33.85 kg/(m^2). General: Patient is a well appearing female in no acute distress HEENT: PERRLA, sclerae anicteric no conjunctival pallor, MMM Neck: supple, no palpable adenopathy Lungs: clear to auscultation bilaterally, no wheezes, rhonchi, or rales Cardiovascular: regular rate rhythm, S1, S2, no murmurs, rubs or gallops Abdomen: Soft, non-tender, non-distended, normoactive bowel sounds, no HSM Extremities: warm and well perfused, no clubbing, cyanosis, or edema Skin: No rashes or lesions Neuro: Non-focal Breasts:  Left mastectomy incision without nodularity, sign of recurrence.  Right breast no nodularity or masses. ECOG PERFORMANCE STATUS: 1 - Symptomatic but completely ambulatory  LABORATORY DATA: Lab Results  Component Value Date   WBC 5.5 05/30/2013   HGB 12.0 05/30/2013   HCT 36.8 05/30/2013   MCV 82.3 05/30/2013   PLT 172 05/30/2013      Chemistry      Component Value Date/Time   NA 143 05/30/2013 1351   NA 140 05/09/2013 1440   K 3.9 05/30/2013 1351   K 3.6 05/09/2013 1440   CL 106 05/09/2013 1440   CL 109* 12/13/2012 0833   CO2 23 05/30/2013 1351   CO2 27 05/09/2013 1440   BUN 19.0 05/30/2013 1351   BUN 19 05/09/2013 1440   CREATININE 0.8 05/30/2013 1351   CREATININE 0.57 05/09/2013 1440      Component Value Date/Time   CALCIUM 8.4 05/30/2013 1351   CALCIUM 8.6 05/09/2013 1440   ALKPHOS 68 05/30/2013 1351   ALKPHOS 73 05/09/2013 1440   AST 17 05/30/2013 1351   AST 18 05/09/2013 1440   ALT 18 05/30/2013 1351   ALT 20 05/09/2013 1440   BILITOT 0.36 05/30/2013 1351   BILITOT 0.2* 05/09/2013 1440       RADIOGRAPHIC STUDIES:  No results  found.  ASSESSMENT: 42 year old female with  #1 T2 N2 A. Invasive ductal carcinoma of the left breast that was multifocal she is status post mastectomy with axillary lymph node dissection in September October 2013. Patient's tumor was ER positive PR positive HER-2/neu positive.  #2 she completed adjuvant chemotherapy consisting of Taxotere carboplatinum and Herceptin total of 6 cycles of therapy as planned. She then started every three week herceptin, and received 6 weeks of radiation therapy.    #3 patient had last echocardiogram-9/4, EF was 55% (previously 45-50% on 7/8) Herceptin was put on hold x 2 cycles, and Carvedilol and Losartan were started, Carvedilol was changed to Bisoprolol daily.  Patient f/u with Dr. Gala Romney 9/4 and he increased Bisoprolol to 10mg  daily and continued Losartan 25mg  daily.  She was cleared to proceed with Herceptin and had f/u on 05/12/13 and was stabel.    #4 patient is complaining of increasing weakness in her lower extremities. She does have underlying cerebral palsy. She was encouraged to continue to exercise.  #5 Patient is ER PR positive, and was started on Tamoxifen 11/23/12.  Risks and benefits were explained to the patient.  She is s/p TAH, she will get her eye exams annually  PLAN:   #1 Doing well.  Patient will proceed with Herceptin today. She was cleared to continue Herceptin by Dr. Gala Romney.  I ordered a vitamin d level to evaluate the fatigue.  Her hemoglobin is stable.   #2 She will continue Tamoxifen daily.  She is tolerating it well.    #3 Continues to do well with Effexor XR she will continue this. I increased the dose to 75 mg daily.    #4 She will continue Ferrous Sulfate PO BID.  Her hemoglobin is stable and she is tolerating the medication without difficulty.    #5 Patient will return in 3 weeks for her next Herceptin.    All questions were answered. The patient knows to call the clinic with any problems, questions or concerns. We can  certainly see the patient much sooner if necessary.  I spent 25 minutes counseling the patient face to face. The total time spent in the appointment was 30 minutes.  Illa Level, NP Medical Oncology [facility] [phone]

## 2013-05-31 LAB — VITAMIN D 25 HYDROXY (VIT D DEFICIENCY, FRACTURES): Vit D, 25-Hydroxy: 13 ng/mL — ABNORMAL LOW (ref 30–89)

## 2013-06-02 ENCOUNTER — Other Ambulatory Visit: Payer: Self-pay | Admitting: *Deleted

## 2013-06-02 MED ORDER — ERGOCALCIFEROL 1.25 MG (50000 UT) PO CAPS: ORAL_CAPSULE | ORAL | Status: DC

## 2013-06-02 NOTE — Telephone Encounter (Signed)
Per NP notified pt to begin taking Ergocalciferal 50,000 units Take 1 tablet once a week on Sunday for 8 weeks. Pt verbalized understanding, confirmed pharmacy to send in Rx. No further concerns.

## 2013-06-20 ENCOUNTER — Encounter: Payer: Self-pay | Admitting: Adult Health

## 2013-06-20 ENCOUNTER — Telehealth: Payer: Self-pay | Admitting: Oncology

## 2013-06-20 ENCOUNTER — Ambulatory Visit (HOSPITAL_BASED_OUTPATIENT_CLINIC_OR_DEPARTMENT_OTHER): Payer: PRIVATE HEALTH INSURANCE

## 2013-06-20 ENCOUNTER — Other Ambulatory Visit: Payer: Self-pay | Admitting: *Deleted

## 2013-06-20 ENCOUNTER — Other Ambulatory Visit (HOSPITAL_BASED_OUTPATIENT_CLINIC_OR_DEPARTMENT_OTHER): Payer: PRIVATE HEALTH INSURANCE

## 2013-06-20 ENCOUNTER — Ambulatory Visit (HOSPITAL_BASED_OUTPATIENT_CLINIC_OR_DEPARTMENT_OTHER): Payer: PRIVATE HEALTH INSURANCE | Admitting: Adult Health

## 2013-06-20 VITALS — BP 130/90 | HR 73 | Temp 97.9°F | Resp 18 | Ht 60.0 in | Wt 170.3 lb

## 2013-06-20 DIAGNOSIS — C50419 Malignant neoplasm of upper-outer quadrant of unspecified female breast: Secondary | ICD-10-CM

## 2013-06-20 DIAGNOSIS — C50919 Malignant neoplasm of unspecified site of unspecified female breast: Secondary | ICD-10-CM

## 2013-06-20 DIAGNOSIS — E559 Vitamin D deficiency, unspecified: Secondary | ICD-10-CM

## 2013-06-20 DIAGNOSIS — Z5112 Encounter for antineoplastic immunotherapy: Secondary | ICD-10-CM

## 2013-06-20 DIAGNOSIS — C50412 Malignant neoplasm of upper-outer quadrant of left female breast: Secondary | ICD-10-CM

## 2013-06-20 DIAGNOSIS — R5381 Other malaise: Secondary | ICD-10-CM

## 2013-06-20 DIAGNOSIS — Z170 Estrogen receptor positive status [ER+]: Secondary | ICD-10-CM

## 2013-06-20 DIAGNOSIS — C773 Secondary and unspecified malignant neoplasm of axilla and upper limb lymph nodes: Secondary | ICD-10-CM

## 2013-06-20 LAB — COMPREHENSIVE METABOLIC PANEL (CC13)
ALT: 24 U/L (ref 0–55)
Albumin: 3.4 g/dL — ABNORMAL LOW (ref 3.5–5.0)
Anion Gap: 10 mEq/L (ref 3–11)
CO2: 24 mEq/L (ref 22–29)
Calcium: 8.8 mg/dL (ref 8.4–10.4)
Chloride: 109 mEq/L (ref 98–109)
Potassium: 3.6 mEq/L (ref 3.5–5.1)
Sodium: 142 mEq/L (ref 136–145)
Total Protein: 6.9 g/dL (ref 6.4–8.3)

## 2013-06-20 LAB — CBC WITH DIFFERENTIAL/PLATELET
BASO%: 0.2 % (ref 0.0–2.0)
Basophils Absolute: 0 10*3/uL (ref 0.0–0.1)
EOS%: 2.1 % (ref 0.0–7.0)
Eosinophils Absolute: 0.1 10*3/uL (ref 0.0–0.5)
HCT: 39.1 % (ref 34.8–46.6)
HGB: 12.8 g/dL (ref 11.6–15.9)
LYMPH%: 27.3 % (ref 14.0–49.7)
MCH: 27.1 pg (ref 25.1–34.0)
MCHC: 32.7 g/dL (ref 31.5–36.0)
MCV: 82.8 fL (ref 79.5–101.0)
MONO#: 0.3 10*3/uL (ref 0.1–0.9)
MONO%: 5.5 % (ref 0.0–14.0)
NEUT#: 3.8 10*3/uL (ref 1.5–6.5)
NEUT%: 64.9 % (ref 38.4–76.8)
Platelets: 161 10*3/uL (ref 145–400)
RBC: 4.72 10*6/uL (ref 3.70–5.45)
RDW: 15 % — ABNORMAL HIGH (ref 11.2–14.5)
WBC: 5.8 10*3/uL (ref 3.9–10.3)
lymph#: 1.6 10*3/uL (ref 0.9–3.3)

## 2013-06-20 MED ORDER — DIPHENHYDRAMINE HCL 25 MG PO CAPS
50.0000 mg | ORAL_CAPSULE | Freq: Once | ORAL | Status: AC
Start: 2013-06-20 — End: 2013-06-20
  Administered 2013-06-20: 50 mg via ORAL

## 2013-06-20 MED ORDER — TRASTUZUMAB CHEMO INJECTION 440 MG
6.0000 mg/kg | Freq: Once | INTRAVENOUS | Status: AC
  Administered 2013-06-20: 462 mg via INTRAVENOUS
  Filled 2013-06-20: qty 22

## 2013-06-20 MED ORDER — ACETAMINOPHEN 325 MG PO TABS
ORAL_TABLET | ORAL | Status: AC
  Filled 2013-06-20: qty 2

## 2013-06-20 MED ORDER — DIPHENHYDRAMINE HCL 25 MG PO CAPS
ORAL_CAPSULE | ORAL | Status: AC
  Filled 2013-06-20: qty 2

## 2013-06-20 MED ORDER — ACETAMINOPHEN 325 MG PO TABS
650.0000 mg | ORAL_TABLET | Freq: Once | ORAL | Status: AC
  Administered 2013-06-20: 650 mg via ORAL

## 2013-06-20 MED ORDER — HEPARIN SOD (PORK) LOCK FLUSH 100 UNIT/ML IV SOLN
500.0000 [IU] | Freq: Once | INTRAVENOUS | Status: AC | PRN
  Administered 2013-06-20: 500 [IU]
  Filled 2013-06-20: qty 5

## 2013-06-20 MED ORDER — ALPRAZOLAM 0.25 MG PO TABS: 0.2500 mg | ORAL_TABLET | Freq: Every evening | ORAL | Status: DC | PRN

## 2013-06-20 MED ORDER — SODIUM CHLORIDE 0.9 % IJ SOLN
10.0000 mL | INTRAMUSCULAR | Status: DC | PRN
  Administered 2013-06-20: 10 mL
  Filled 2013-06-20: qty 10

## 2013-06-20 MED ORDER — SODIUM CHLORIDE 0.9 % IV SOLN
Freq: Once | INTRAVENOUS | Status: AC
  Administered 2013-06-20: 16:00:00 via INTRAVENOUS

## 2013-06-20 NOTE — Telephone Encounter (Signed)
, °

## 2013-06-20 NOTE — Patient Instructions (Addendum)
[facility] Discharge Instructions for Patients Receiving Chemotherapy  Today you received the following chemotherapy agents Herceptin.     If you develop nausea and vomiting call the clinic.   BELOW ARE SYMPTOMS THAT SHOULD BE REPORTED IMMEDIATELY:  *FEVER GREATER THAN 100.5 F  *CHILLS WITH OR WITHOUT FEVER  NAUSEA AND VOMITING THAT IS NOT CONTROLLED WITH YOUR NAUSEA MEDICATION  *UNUSUAL SHORTNESS OF BREATH  *UNUSUAL BRUISING OR BLEEDING  TENDERNESS IN MOUTH AND THROAT WITH OR WITHOUT PRESENCE OF ULCERS  *URINARY PROBLEMS  *BOWEL PROBLEMS  UNUSUAL RASH Items with * indicate a potential emergency and should be followed up as soon as possible.  Feel free to call the clinic should  you have any questions or concerns. The clinic phone number is [phone].   It is a pleasure to serve you today!

## 2013-06-20 NOTE — Progress Notes (Signed)
OFFICE PROGRESS NOTE  CC  [name], Alicia Richards, MD [address] [place] [address] Dr. Emelia Loron Dr. 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 Originally diagnosed in September 2013.  PRIOR THERAPY:  #1Patient originally presented in September 2013 when she noticed dimpling within the left breast. She subsequently had a mammogram that showed suspicious findings. Further workup showed multiple areas of concern. There was noted to be a 2.8 cm spiculated mass within the upper outer quadrant of the left breast. The pathology did show invasive ductal carcinoma from the upper-outer quadrant of the left breast. There was also noted to be a 9 mm enhancing mass which was 2.2 cm anterior to the known area of malignancy. A third mass measuring 8 mm was 2.1 cm anterior to the known area of malignancy. Also a satellite spiculated mass was present 8 mm and a 4 mm rounded mass in the 12:00. She did have additional biopsies and was diagnosed With multicentric disease. There were also noted to be couple of small areas of concern in the right breast but these were negative for malignancy on biopsy.  #2 patient ultimately proceeded to undergo simple mastectomy on the left with sentinel lymph node biopsy on 03/21/2012. The final pathology within the left breast showed multifocal invasive and in situ ductal carcinoma. 4 distinct areas were seen measuring 3.2 cm, 2.3 cm, 1.3 cm, and 0.8 cm. All margins were negative. There was focal lymphatic vascular involvement and normal involvement by tumor. 2 sentinel nodes were positive for metastatic carcinoma with extracapsular extension. The tumors were ER positive PR positive and HER-2/neu positive.  #3 patient did undergo axillary lymph node dissection of the left axilla On 04/04/2012 additional 8 lymph nodes were removed. I have those 3 were positive therefore patient had 5/10 lymph  nodes positive for metastatic disease. With the final pathologic staging at T2 N2a.  #4 patient was begun on adjuvant systemic chemotherapy by Dr. Pierce Crane consisting of Taxotere carboplatinum given every 21 days with Herceptin given on a weekly basis for the duration of Taxotere and carbo platinum. Her chemotherapy began On 05/03/2012. She received a total of 6 cycles of this.  #5 after completion of chemotherapy she received radiation therapy under the care of Dr. Dorothy Puffer from 10/08/12 through 11/21/12.  #6 She began q 3 week herceptin on 10/11/12.  Tamoxifen added on 11/22/12. She has followed with Dr. Gala Romney closely and her end of July and August Herceptin doses were held due to decreased ejection fraction.  She has since restarted and is followed closely by Dr. Prescott Gum office.    CURRENT THERAPY: every 3 week Herceptin and Tamoxifen  INTERVAL HISTORY: Alicia Pena 42 y.o. female returns for Followup visit prior to her next herceptin dose.  Today is her last dose of Herceptin.  She is doing well.  She continues to take the Tamoxifen and is tolerating it well.  She was fatigued at her last visit and I checked a Vitamin D level.  It was 13.  She has started Ergocalciferol weekly and feels much improved.  She denies fevers, chills, night sweats, pain, or any further concerns.    MEDICAL HISTORY: Past Medical History  Diagnosis Date  . Anemia   . Anxiety     does not like needles ..   . 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 Breast cancer 03/21/2012    left multifocal invasive,  in situ ca,margins not involved,ER/PR=+,metastatic (1/1) node  . Allergy   . Complication of anesthesia   . History of radiation therapy 10/08/2012-11/21/2012    50.4 gray to left chest wall and supraclavicular region    ALLERGIES:  is allergic to duricef.  MEDICATIONS:  Current Outpatient Prescriptions  Medication Sig Dispense Refill  . bisoprolol (ZEBETA) 10 MG tablet Take 0.5 tablets (5 mg total) by mouth  daily.  15 tablet  3  . ergocalciferol (VITAMIN D2) 50000 UNITS capsule Take 1 Tablet once a week on Sunday x 8 weeks  8 capsule  0  . ibuprofen (ADVIL,MOTRIN) 200 MG tablet Take 200 mg by mouth every 6 (six) hours as needed.      . lidocaine-prilocaine (EMLA) cream Apply topically as needed.  30 g  2  . losartan (COZAAR) 25 MG tablet TAKE 1 TABLET BY MOUTH EVERY DAY  30 tablet  6  . Multiple Vitamin (MULTIVITAMIN WITH MINERALS) TABS Take 1 tablet by mouth daily.      . Multiple Vitamins-Iron (ONE-TABLET-DAILY/IRON PO) Take 1 tablet by mouth daily.      . tamoxifen (NOLVADEX) 20 MG tablet Take 1 tablet (20 mg total) by mouth daily.  90 tablet  12  . venlafaxine XR (EFFEXOR-XR) 75 MG 24 hr capsule Take 1 capsule (75 mg total) by mouth daily with breakfast.  30 capsule  12  . acetaminophen (TYLENOL) 325 MG tablet Take 650 mg by mouth every 6 (six) hours as needed.      . ALPRAZolam (XANAX) 0.25 MG tablet Take 1 tablet (0.25 mg total) by mouth at bedtime as needed for sleep.  30 tablet  0  . LORazepam (ATIVAN) 1 MG tablet        No current facility-administered medications for this visit.    SURGICAL HISTORY:  Past Surgical History  Procedure Laterality Date  . Leg surgery      LLE  . Eye surgery      as a child  . Mastectomy complete / simple w/ sentinel node biopsy  03/21/2012    left Dr.Matthew Dwain Sarna  . Breast biopsy  89/07/2011    left breast uoq bx=invasive ductal ca  . Breast biopsy bi lateral  02/15/12    left = invasive ductal, right breast= fibroadenoma,fibrocystic changes,no atypia,hyperlasia or malignancy identified  . Abdominal hysterectomy  2006    w/o oophorectomy  . Axillary lymph node dissection  04/04/2012    Procedure: AXILLARY LYMPH NODE DISSECTION;  Surgeon: Emelia Loron, MD;  Location: [facility];  Service: General;  Laterality: Left;  Left Axillary lymph node dissection, Port a cath placement  . Portacath placement  04/04/2012    Procedure:  INSERTION PORT-A-CATH;  Surgeon: Emelia Loron, MD;  Location:  SURGERY CENTER;  Service: General;  Laterality: Right;    REVIEW OF SYSTEMS:   A 10 point review of systems was conducted and is otherwise negative except for what is noted above.    PHYSICAL EXAMINATION: Blood pressure 130/90, pulse 73, temperature 97.9 F (36.6 C), temperature source Oral, resp. rate 18, height 5' (1.524 m), weight 170 lb 4.8 oz (77.248 kg). Body mass index is 33.26 kg/(m^2). General: Patient is a well appearing female in no acute distress HEENT: PERRLA, sclerae anicteric no conjunctival pallor, MMM Neck: supple, no palpable adenopathy Lungs: clear to auscultation bilaterally, no wheezes, rhonchi, or rales Cardiovascular: regular rate rhythm, S1, S2, no murmurs, rubs or gallops Abdomen: Soft, non-tender, non-distended, normoactive bowel sounds, no HSM  Extremities: warm and well perfused, no clubbing, cyanosis, or edema Skin: No rashes or lesions Neuro: Non-focal Breasts:  Left mastectomy incision without nodularity, sign of recurrence.  Right breast no nodularity or masses. ECOG PERFORMANCE STATUS: 1 - Symptomatic but completely ambulatory   LABORATORY DATA: Lab Results  Component Value Date   WBC 5.8 06/20/2013   HGB 12.8 06/20/2013   HCT 39.1 06/20/2013   MCV 82.8 06/20/2013   PLT 161 06/20/2013      Chemistry      Component Value Date/Time   NA 142 06/20/2013 1344   NA 140 05/09/2013 1440   K 3.6 06/20/2013 1344   K 3.6 05/09/2013 1440   CL 106 05/09/2013 1440   CL 109* 12/13/2012 0833   CO2 24 06/20/2013 1344   CO2 27 05/09/2013 1440   BUN 17.6 06/20/2013 1344   BUN 19 05/09/2013 1440   CREATININE 0.7 06/20/2013 1344   CREATININE 0.57 05/09/2013 1440      Component Value Date/Time   CALCIUM 8.8 06/20/2013 1344   CALCIUM 8.6 05/09/2013 1440   ALKPHOS 80 06/20/2013 1344   ALKPHOS 73 05/09/2013 1440   AST 23 06/20/2013 1344   AST 18 05/09/2013 1440   ALT 24 06/20/2013 1344    ALT 20 05/09/2013 1440   BILITOT 0.64 06/20/2013 1344   BILITOT 0.2* 05/09/2013 1440       RADIOGRAPHIC STUDIES:  No results found.  ASSESSMENT: 42 year old female with  #1 T2 N2 A. Invasive ductal carcinoma of the left breast that was multifocal she is status post mastectomy with axillary lymph node dissection in September October 2013. Patient's tumor was ER positive PR positive HER-2/neu positive.  #2 she completed adjuvant chemotherapy consisting of Taxotere carboplatinum and Herceptin total of 6 cycles of therapy as planned. She then started every three week herceptin, and received 6 weeks of radiation therapy.    #3 patient had last echocardiogram-9/4, EF was 55% (previously 45-50% on 7/8) Herceptin was put on hold x 2 cycles, and Carvedilol and Losartan were started, Carvedilol was changed to Bisoprolol daily.  Patient f/u with Dr. Gala Romney 9/4 and he increased Bisoprolol to 10mg  daily and continued Losartan 25mg  daily.  She was cleared to proceed with Herceptin and had f/u on 05/12/13 and was stabel.    #4 patient is complaining of increasing weakness in her lower extremities. She does have underlying cerebral palsy. She was encouraged to continue to exercise.  #5 Patient is ER PR positive, and was started on Tamoxifen 11/23/12.  Risks and benefits were explained to the patient.  She is s/p TAH, she will get her eye exams annually  PLAN:   #1  Patient is doing well today.  Her labs are stable, she will proceed with Herceptin today.  She will begin to exercise once she is finished with treatment. She will continue daily Tamoxifen.    #2 She will continue Ergocalciferol for her fatigue and vitamin D deficiency.    #3 She will return in 3 months for labs, and evaluation.    All questions were answered. The patient knows to call the clinic with any problems, questions or concerns. We can certainly see the patient much sooner if necessary.  I spent 25 minutes counseling the patient  face to face. The total time spent in the appointment was 30 minutes.  Illa Level, NP Medical Oncology [facility] [phone]

## 2013-07-15 ENCOUNTER — Ambulatory Visit (HOSPITAL_BASED_OUTPATIENT_CLINIC_OR_DEPARTMENT_OTHER): Payer: PRIVATE HEALTH INSURANCE

## 2013-07-15 VITALS — BP 117/70 | HR 80 | Temp 97.6°F

## 2013-07-15 DIAGNOSIS — Z95828 Presence of other vascular implants and grafts: Secondary | ICD-10-CM

## 2013-07-15 DIAGNOSIS — Z452 Encounter for adjustment and management of vascular access device: Secondary | ICD-10-CM

## 2013-07-15 DIAGNOSIS — C50419 Malignant neoplasm of upper-outer quadrant of unspecified female breast: Secondary | ICD-10-CM

## 2013-07-15 MED ORDER — SODIUM CHLORIDE 0.9 % IJ SOLN
10.0000 mL | INTRAMUSCULAR | Status: DC | PRN
  Administered 2013-07-15: 10 mL via INTRAVENOUS
  Filled 2013-07-15: qty 10

## 2013-07-15 MED ORDER — HEPARIN SOD (PORK) LOCK FLUSH 100 UNIT/ML IV SOLN
500.0000 [IU] | Freq: Once | INTRAVENOUS | Status: AC
  Administered 2013-07-15: 500 [IU] via INTRAVENOUS
  Filled 2013-07-15: qty 5

## 2013-07-15 NOTE — Patient Instructions (Signed)

## 2013-08-29 ENCOUNTER — Other Ambulatory Visit: Payer: Self-pay | Admitting: Adult Health

## 2013-09-04 ENCOUNTER — Telehealth: Payer: Self-pay | Admitting: Oncology

## 2013-09-04 NOTE — Telephone Encounter (Signed)
Pt called ansd r/s lab and MD to [facility] 3/20

## 2013-09-19 ENCOUNTER — Other Ambulatory Visit: Payer: PRIVATE HEALTH INSURANCE

## 2013-09-19 ENCOUNTER — Ambulatory Visit: Payer: PRIVATE HEALTH INSURANCE | Admitting: Adult Health

## 2013-09-26 ENCOUNTER — Ambulatory Visit: Payer: PRIVATE HEALTH INSURANCE | Admitting: Oncology

## 2013-10-16 ENCOUNTER — Encounter (HOSPITAL_COMMUNITY): Payer: Self-pay | Admitting: Cardiology

## 2013-10-29 ENCOUNTER — Telehealth: Payer: Self-pay | Admitting: *Deleted

## 2013-10-29 NOTE — Telephone Encounter (Signed)
Patient called and scheduled a flush apt. She had questions regarding missed test apt and follow up apt. I have transferred her to the desk RN

## 2013-10-30 ENCOUNTER — Other Ambulatory Visit: Payer: Self-pay

## 2013-10-30 NOTE — Progress Notes (Signed)
Pt called LM requesting to reschedule lab and MD follow up appt.  She missed her appointments in March.  POF sent.

## 2013-11-05 ENCOUNTER — Telehealth: Payer: Self-pay | Admitting: Oncology

## 2013-11-05 NOTE — Telephone Encounter (Signed)
lmonvm advising the pt of her f/u appt

## 2013-11-11 ENCOUNTER — Telehealth: Payer: Self-pay | Admitting: Adult Health

## 2013-11-11 NOTE — Telephone Encounter (Signed)
trans from [facility]-pt wanted to resch appt-adv pt of new time & date of appt

## 2013-11-12 ENCOUNTER — Ambulatory Visit (HOSPITAL_BASED_OUTPATIENT_CLINIC_OR_DEPARTMENT_OTHER): Payer: PRIVATE HEALTH INSURANCE

## 2013-11-12 ENCOUNTER — Other Ambulatory Visit (HOSPITAL_BASED_OUTPATIENT_CLINIC_OR_DEPARTMENT_OTHER): Payer: PRIVATE HEALTH INSURANCE

## 2013-11-12 VITALS — BP 149/86 | HR 82 | Temp 97.3°F | Resp 16

## 2013-11-12 DIAGNOSIS — Z452 Encounter for adjustment and management of vascular access device: Secondary | ICD-10-CM

## 2013-11-12 DIAGNOSIS — C50412 Malignant neoplasm of upper-outer quadrant of left female breast: Secondary | ICD-10-CM

## 2013-11-12 DIAGNOSIS — E559 Vitamin D deficiency, unspecified: Secondary | ICD-10-CM

## 2013-11-12 DIAGNOSIS — Z95828 Presence of other vascular implants and grafts: Secondary | ICD-10-CM

## 2013-11-12 DIAGNOSIS — C50419 Malignant neoplasm of upper-outer quadrant of unspecified female breast: Secondary | ICD-10-CM

## 2013-11-12 LAB — CBC WITH DIFFERENTIAL/PLATELET
BASO%: 0.6 % (ref 0.0–2.0)
BASOS ABS: 0 10*3/uL (ref 0.0–0.1)
EOS%: 3.3 % (ref 0.0–7.0)
Eosinophils Absolute: 0.2 10*3/uL (ref 0.0–0.5)
HCT: 37.2 % (ref 34.8–46.6)
HEMOGLOBIN: 12 g/dL (ref 11.6–15.9)
LYMPH%: 19.9 % (ref 14.0–49.7)
MCH: 26.5 pg (ref 25.1–34.0)
MCHC: 32.2 g/dL (ref 31.5–36.0)
MCV: 82.4 fL (ref 79.5–101.0)
MONO#: 0.3 10*3/uL (ref 0.1–0.9)
MONO%: 5.9 % (ref 0.0–14.0)
NEUT#: 3.6 10*3/uL (ref 1.5–6.5)
NEUT%: 70.3 % (ref 38.4–76.8)
PLATELETS: 190 10*3/uL (ref 145–400)
RBC: 4.52 10*6/uL (ref 3.70–5.45)
RDW: 15.4 % — ABNORMAL HIGH (ref 11.2–14.5)
WBC: 5.2 10*3/uL (ref 3.9–10.3)
lymph#: 1 10*3/uL (ref 0.9–3.3)

## 2013-11-12 LAB — COMPREHENSIVE METABOLIC PANEL (CC13)
ALT: 23 U/L (ref 0–55)
AST: 17 U/L (ref 5–34)
Albumin: 3.2 g/dL — ABNORMAL LOW (ref 3.5–5.0)
Alkaline Phosphatase: 78 U/L (ref 40–150)
Anion Gap: 11 mEq/L (ref 3–11)
BILIRUBIN TOTAL: 0.32 mg/dL (ref 0.20–1.20)
BUN: 12.5 mg/dL (ref 7.0–26.0)
CALCIUM: 8.8 mg/dL (ref 8.4–10.4)
CHLORIDE: 111 meq/L — AB (ref 98–109)
CO2: 23 mEq/L (ref 22–29)
Creatinine: 0.7 mg/dL (ref 0.6–1.1)
Glucose: 95 mg/dl (ref 70–140)
Potassium: 3.8 mEq/L (ref 3.5–5.1)
SODIUM: 145 meq/L (ref 136–145)
TOTAL PROTEIN: 6.4 g/dL (ref 6.4–8.3)

## 2013-11-12 MED ORDER — SODIUM CHLORIDE 0.9 % IJ SOLN
10.0000 mL | INTRAMUSCULAR | Status: DC | PRN
  Administered 2013-11-12: 10 mL via INTRAVENOUS
  Filled 2013-11-12: qty 10

## 2013-11-12 MED ORDER — HEPARIN SOD (PORK) LOCK FLUSH 100 UNIT/ML IV SOLN
500.0000 [IU] | Freq: Once | INTRAVENOUS | Status: AC
  Administered 2013-11-12: 500 [IU] via INTRAVENOUS
  Filled 2013-11-12: qty 5

## 2013-11-12 NOTE — Patient Instructions (Signed)

## 2013-11-13 LAB — VITAMIN D 25 HYDROXY (VIT D DEFICIENCY, FRACTURES): VIT D 25 HYDROXY: 20 ng/mL — AB (ref 30–89)

## 2013-11-17 ENCOUNTER — Ambulatory Visit: Payer: PRIVATE HEALTH INSURANCE | Admitting: Adult Health

## 2013-11-17 ENCOUNTER — Telehealth: Payer: Self-pay

## 2013-11-17 NOTE — Telephone Encounter (Signed)
Let pt know LC sick.  Rescheduled to Friday 1115 with O'Brien.  Pt voiced understanding.

## 2013-11-19 ENCOUNTER — Telehealth: Payer: Self-pay | Admitting: Adult Health

## 2013-11-19 NOTE — Telephone Encounter (Signed)
, °

## 2013-11-21 ENCOUNTER — Ambulatory Visit: Payer: PRIVATE HEALTH INSURANCE | Admitting: Oncology

## 2013-12-02 ENCOUNTER — Ambulatory Visit: Payer: PRIVATE HEALTH INSURANCE

## 2013-12-02 ENCOUNTER — Other Ambulatory Visit: Payer: PRIVATE HEALTH INSURANCE

## 2013-12-09 ENCOUNTER — Ambulatory Visit (HOSPITAL_BASED_OUTPATIENT_CLINIC_OR_DEPARTMENT_OTHER): Payer: PRIVATE HEALTH INSURANCE | Admitting: Adult Health

## 2013-12-09 ENCOUNTER — Encounter: Payer: Self-pay | Admitting: Adult Health

## 2013-12-09 VITALS — BP 145/93 | HR 88 | Temp 98.6°F | Resp 18 | Ht 60.0 in | Wt 167.3 lb

## 2013-12-09 DIAGNOSIS — C773 Secondary and unspecified malignant neoplasm of axilla and upper limb lymph nodes: Secondary | ICD-10-CM

## 2013-12-09 DIAGNOSIS — E559 Vitamin D deficiency, unspecified: Secondary | ICD-10-CM

## 2013-12-09 DIAGNOSIS — G808 Other cerebral palsy: Secondary | ICD-10-CM

## 2013-12-09 DIAGNOSIS — C50419 Malignant neoplasm of upper-outer quadrant of unspecified female breast: Secondary | ICD-10-CM

## 2013-12-09 DIAGNOSIS — C50919 Malignant neoplasm of unspecified site of unspecified female breast: Secondary | ICD-10-CM

## 2013-12-09 MED ORDER — ERGOCALCIFEROL 1.25 MG (50000 UT) PO CAPS: 50000.0000 [IU] | ORAL_CAPSULE | ORAL | Status: DC

## 2013-12-09 NOTE — Progress Notes (Signed)
OFFICE PROGRESS NOTE  CC  Alicia Pena, Alicia Burke, MD [address] [place] [address] Dr. Rolm Bookbinder Dr. 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 Originally diagnosed in September 2013.  PRIOR THERAPY:  #1Patient originally presented in September 2013 when she noticed dimpling within the left breast. She subsequently had a mammogram that showed suspicious findings. Further workup showed multiple areas of concern. There was noted to be a 2.8 cm spiculated mass within the upper outer quadrant of the left breast. The pathology did show invasive ductal carcinoma from the upper-outer quadrant of the left breast. There was also noted to be a 9 mm enhancing mass which was 2.2 cm anterior to the known area of malignancy. A third mass measuring 8 mm was 2.1 cm anterior to the known area of malignancy. Also a satellite spiculated mass was present 8 mm and a 4 mm rounded mass in the 12:00. She did have additional biopsies and was diagnosed With multicentric disease. There were also noted to be couple of small areas of concern in the right breast but these were negative for malignancy on biopsy.  #2 patient ultimately proceeded to undergo simple mastectomy on the left with sentinel lymph node biopsy on 03/21/2012. The final pathology within the left breast showed multifocal invasive and in situ ductal carcinoma. 4 distinct areas were seen measuring 3.2 cm, 2.3 cm, 1.3 cm, and 0.8 cm. All margins were negative. There was focal lymphatic vascular involvement and normal involvement by tumor. 2 sentinel nodes were positive for metastatic carcinoma with extracapsular extension. The tumors were ER positive PR positive and HER-2/neu positive.  #3 patient did undergo axillary lymph node dissection of the left axilla On 04/04/2012 additional 8 lymph nodes were removed. I have those 3 were positive therefore patient had 5/10 lymph  nodes positive for metastatic disease. With the final pathologic staging at T2 N2a.  #4 patient was begun on adjuvant systemic chemotherapy by Dr. Eston Esters consisting of Taxotere carboplatinum given every 21 days with Herceptin given on a weekly basis for the duration of Taxotere and carbo platinum. Her chemotherapy began On 05/03/2012. She received a total of 6 cycles of this.  #5 after completion of chemotherapy she received radiation therapy under the care of Dr. Kyung Rudd from 10/08/12 through 11/21/12.  #6 She began q 3 week herceptin on 10/11/12.  Tamoxifen added on 11/22/12. She has followed with Dr. Haroldine Laws closely and her end of July and August Herceptin doses were held due to decreased ejection fraction.  She has since restarted and is followed closely by Dr. Clayborne Dana office.    CURRENT THERAPY: Tamoxifen daily  INTERVAL HISTORY: Alicia Pena 43 y.o. female returns for Followup visit today of her breast cancer.  She is getting married this weekend and is very excited about it.  She is taking Tamoxifen daily and is doing well.  She does bruise slightly easier than usual.  She denies fevers, chills, nausea, vomiting, constipation, diarrhea, numbness/tingling, or any further concerns.    MEDICAL HISTORY: Past Medical History  Diagnosis Date  . Anemia   . Anxiety     does not like needles ..   . 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 Breast cancer 03/21/2012    left multifocal invasive, in situ ca,margins not involved,ER/PR=+,metastatic (1/1) node  . Allergy   . Complication of anesthesia   . History of radiation therapy 10/08/2012-11/21/2012  50.4 gray to left chest wall and supraclavicular region    ALLERGIES:  is allergic to duricef.  MEDICATIONS:  Current Outpatient Prescriptions  Medication Sig Dispense Refill  . tamoxifen (NOLVADEX) 20 MG tablet Take 1 tablet (20 mg total) by mouth daily.  90 tablet  12  . venlafaxine XR (EFFEXOR-XR) 75 MG 24 hr capsule Take 1 capsule  (75 mg total) by mouth daily with breakfast.  30 capsule  12  . acetaminophen (TYLENOL) 325 MG tablet Take 650 mg by mouth every 6 (six) hours as needed.      . ALPRAZolam (XANAX) 0.25 MG tablet Take 1 tablet (0.25 mg total) by mouth at bedtime as needed for sleep.  30 tablet  0  . bisoprolol (ZEBETA) 10 MG tablet Take 0.5 tablets (5 mg total) by mouth daily.  15 tablet  3  . ergocalciferol (VITAMIN D2) 50000 UNITS capsule Take 1 capsule (50,000 Units total) by mouth once a week.  4 capsule  3  . ibuprofen (ADVIL,MOTRIN) 200 MG tablet Take 200 mg by mouth every 6 (six) hours as needed.      . lidocaine-prilocaine (EMLA) cream Apply topically as needed.  30 g  2  . LORazepam (ATIVAN) 1 MG tablet       . Multiple Vitamin (MULTIVITAMIN WITH MINERALS) TABS Take 1 tablet by mouth daily.      . Multiple Vitamins-Iron (ONE-TABLET-DAILY/IRON PO) Take 1 tablet by mouth daily.       No current facility-administered medications for this visit.    SURGICAL HISTORY:  Past Surgical History  Procedure Laterality Date  . Leg surgery      LLE  . Eye surgery      as a child  . Mastectomy complete / simple w/ sentinel node biopsy  03/21/2012    left Dr.Matthew Donne Hazel  . Breast biopsy  89/07/2011    left breast uoq bx=invasive ductal ca  . Breast biopsy bi lateral  02/15/12    left = invasive ductal, right breast= fibroadenoma,fibrocystic changes,no atypia,hyperlasia or malignancy identified  . Abdominal hysterectomy  2006    w/o oophorectomy  . Axillary lymph node dissection  04/04/2012    Procedure: AXILLARY LYMPH NODE DISSECTION;  Surgeon: Rolm Bookbinder, MD;  Location: [facility];  Service: General;  Laterality: Left;  Left Axillary lymph node dissection, Port a cath placement  . Portacath placement  04/04/2012    Procedure: INSERTION PORT-A-CATH;  Surgeon: Rolm Bookbinder, MD;  Location: [facility];  Service: General;  Laterality: Right;    REVIEW OF SYSTEMS:   A  10 point review of systems was conducted and is otherwise negative except for what is noted above.   Health Maintenance  Mammogram: 01/2013 Colonoscopy:n/a Bone Density Scan: n/a Pap Smear: s/p TAH 2006 Eye Exam: 10/2013 Vitamin D Level:5/15 Lipid Panel: patient unsure    PHYSICAL EXAMINATION: Blood pressure 145/93, pulse 88, temperature 98.6 F (37 C), temperature source Oral, resp. rate 18, height 5' (1.524 m), weight 167 lb 4.8 oz (75.887 kg). Body mass index is 32.67 kg/(m^2). General: Patient is a well appearing female in no acute distress HEENT: PERRLA, sclerae anicteric no conjunctival pallor, MMM Neck: supple, no palpable adenopathy Lungs: clear to auscultation bilaterally, no wheezes, rhonchi, or rales Cardiovascular: regular rate rhythm, S1, S2, no murmurs, rubs or gallops Abdomen: Soft, non-tender, non-distended, normoactive bowel sounds, no HSM Extremities: warm and well perfused, no clubbing, cyanosis, or edema Skin: No rashes or lesions Neuro: Non-focal Breasts:  Left mastectomy incision without nodularity, sign of recurrence.  Right breast no nodularity or masses. ECOG PERFORMANCE STATUS: 1 - Symptomatic but completely ambulatory   LABORATORY DATA: Lab Results  Component Value Date   WBC 5.2 11/12/2013   HGB 12.0 11/12/2013   HCT 37.2 11/12/2013   MCV 82.4 11/12/2013   PLT 190 11/12/2013      Chemistry      Component Value Date/Time   NA 145 11/12/2013 1038   NA 140 05/09/2013 1440   K 3.8 11/12/2013 1038   K 3.6 05/09/2013 1440   CL 106 05/09/2013 1440   CL 109* 12/13/2012 0833   CO2 23 11/12/2013 1038   CO2 27 05/09/2013 1440   BUN 12.5 11/12/2013 1038   BUN 19 05/09/2013 1440   CREATININE 0.7 11/12/2013 1038   CREATININE 0.57 05/09/2013 1440      Component Value Date/Time   CALCIUM 8.8 11/12/2013 1038   CALCIUM 8.6 05/09/2013 1440   ALKPHOS 78 11/12/2013 1038   ALKPHOS 73 05/09/2013 1440   AST 17 11/12/2013 1038   AST 18 05/09/2013 1440   ALT 23 11/12/2013 1038    ALT 20 05/09/2013 1440   BILITOT 0.32 11/12/2013 1038   BILITOT 0.2* 05/09/2013 1440       RADIOGRAPHIC STUDIES:  No results found.  ASSESSMENT: 43 year old female with  #1 T2 N2 A. Invasive ductal carcinoma of the left breast that was multifocal she is status post mastectomy with axillary lymph node dissection in September October 2013. Patient's tumor was ER positive PR positive HER-2/neu positive.  #2 she completed adjuvant chemotherapy consisting of Taxotere carboplatinum and Herceptin total of 6 cycles of therapy as planned. She then started every three week herceptin, and received 6 weeks of radiation therapy.    #3 patient had echocardiogram-9/4, EF was 55% (previously 45-50% on 7/8) Herceptin was put on hold x 2 cycles, and Carvedilol and Losartan were started, Carvedilol was changed to Bisoprolol daily.  Patient f/u with Dr. Haroldine Laws 9/4 and he increased Bisoprolol to 71m daily and continued Losartan 239mdaily.  She was cleared to proceed with Herceptin and had f/u on 05/12/13 and was stable.    #4 patient is complaining of increasing weakness in her lower extremities. She does have underlying cerebral palsy. She was encouraged to continue to exercise.  #5 Patient is ER PR positive, and was started on Tamoxifen 11/23/12.  Risks and benefits were explained to the patient.  She is s/p TAH, she will get her eye exams annually  PLAN:   #1  Patient is doing well today. She has no sign of recurrence.  She will continue Tamoxifen daily.  We reviewed her health maintenance and survivorship, I recommended a healthy diet, exercise, and monthly self breast exams.   #2 She will continue Ergocalciferol for her fatigue and vitamin D deficiency.  I prescribed this again today.    #3 I have requested Dr. WaDonne Hazelemove her port.  She would like to wait until after her wedding.   The patient will return in 6 months for labs and evaluation.      All questions were answered. The patient knows  to call the clinic with any problems, questions or concerns. We can certainly see the patient much sooner if necessary.  I spent 25 minutes counseling the patient face to face. The total time spent in the appointment was 30 minutes.  LiMinette [phone]

## 2013-12-09 NOTE — Patient Instructions (Signed)
You are doing well.  You have no sign of recurrence.  Continue taking Tamoxifen daily.  I recommend a healthy diet, exercise, and monthly self breast exams.  Please call us if you have any questions or concerns.    We will see you back in 6 months.

## 2013-12-12 ENCOUNTER — Telehealth (INDEPENDENT_AMBULATORY_CARE_PROVIDER_SITE_OTHER): Payer: Self-pay | Admitting: General Surgery

## 2013-12-12 NOTE — Telephone Encounter (Signed)
LMOM asking pt to return my.  This is to inform her  That we have received permission to remove her PAc, but Dr. Donne Hazel would like to know if she would like to have it done with local anesthetics, or conscious sedation?  Either choice would be done over at [facility].

## 2013-12-13 ENCOUNTER — Telehealth: Payer: Self-pay | Admitting: Adult Health

## 2013-12-17 NOTE — Telephone Encounter (Signed)
Called patient again in regards to her PAC removal and how she would like to schedule this.  She decided that she would like to hold off on removing it at this time due to her wedding that is coming up soon.  She informed us that she will call back when she is ready to schedule.  Explained to her that at that time we will find out if she would like to schedule using local anesthetics or conscious sedation.  Patient verbalized understanding and agreement with this plan.

## 2014-01-20 ENCOUNTER — Encounter (INDEPENDENT_AMBULATORY_CARE_PROVIDER_SITE_OTHER): Payer: Self-pay | Admitting: General Surgery

## 2014-02-19 ENCOUNTER — Emergency Department (HOSPITAL_BASED_OUTPATIENT_CLINIC_OR_DEPARTMENT_OTHER): Payer: PRIVATE HEALTH INSURANCE

## 2014-02-19 ENCOUNTER — Emergency Department (HOSPITAL_BASED_OUTPATIENT_CLINIC_OR_DEPARTMENT_OTHER)
Admission: EM | Admit: 2014-02-19 | Discharge: 2014-02-19 | Disposition: A | Discharge status: Home / Self Care | Payer: PRIVATE HEALTH INSURANCE | Attending: Emergency Medicine | Admitting: Emergency Medicine

## 2014-02-19 ENCOUNTER — Encounter (HOSPITAL_BASED_OUTPATIENT_CLINIC_OR_DEPARTMENT_OTHER): Payer: Self-pay | Admitting: Emergency Medicine

## 2014-02-19 ENCOUNTER — Other Ambulatory Visit (INDEPENDENT_AMBULATORY_CARE_PROVIDER_SITE_OTHER): Payer: Self-pay | Admitting: General Surgery

## 2014-02-19 DIAGNOSIS — Z791 Long term (current) use of non-steroidal anti-inflammatories (NSAID): Secondary | ICD-10-CM | POA: Diagnosis not present

## 2014-02-19 DIAGNOSIS — R0981 Nasal congestion: Secondary | ICD-10-CM

## 2014-02-19 DIAGNOSIS — Z853 Personal history of malignant neoplasm of breast: Secondary | ICD-10-CM | POA: Insufficient documentation

## 2014-02-19 DIAGNOSIS — Z79899 Other long term (current) drug therapy: Secondary | ICD-10-CM | POA: Insufficient documentation

## 2014-02-19 DIAGNOSIS — Z8669 Personal history of other diseases of the nervous system and sense organs: Secondary | ICD-10-CM | POA: Insufficient documentation

## 2014-02-19 DIAGNOSIS — Z923 Personal history of irradiation: Secondary | ICD-10-CM | POA: Diagnosis not present

## 2014-02-19 DIAGNOSIS — R209 Unspecified disturbances of skin sensation: Secondary | ICD-10-CM | POA: Insufficient documentation

## 2014-02-19 DIAGNOSIS — J069 Acute upper respiratory infection, unspecified: Secondary | ICD-10-CM | POA: Diagnosis not present

## 2014-02-19 DIAGNOSIS — F411 Generalized anxiety disorder: Secondary | ICD-10-CM | POA: Insufficient documentation

## 2014-02-19 DIAGNOSIS — R0602 Shortness of breath: Secondary | ICD-10-CM | POA: Insufficient documentation

## 2014-02-19 DIAGNOSIS — J40 Bronchitis, not specified as acute or chronic: Secondary | ICD-10-CM | POA: Diagnosis not present

## 2014-02-19 DIAGNOSIS — Z862 Personal history of diseases of the blood and blood-forming organs and certain disorders involving the immune mechanism: Secondary | ICD-10-CM | POA: Insufficient documentation

## 2014-02-19 DIAGNOSIS — Z9012 Acquired absence of left breast and nipple: Secondary | ICD-10-CM

## 2014-02-19 LAB — COMPREHENSIVE METABOLIC PANEL
ALK PHOS: 99 U/L (ref 39–117)
ALT: 32 U/L (ref 0–35)
AST: 35 U/L (ref 0–37)
Albumin: 3.5 g/dL (ref 3.5–5.2)
Anion gap: 16 — ABNORMAL HIGH (ref 5–15)
BUN: 10 mg/dL (ref 6–23)
CHLORIDE: 103 meq/L (ref 96–112)
CO2: 23 meq/L (ref 19–32)
Calcium: 9.4 mg/dL (ref 8.4–10.5)
Creatinine, Ser: 0.6 mg/dL (ref 0.50–1.10)
GLUCOSE: 124 mg/dL — AB (ref 70–99)
POTASSIUM: 3.1 meq/L — AB (ref 3.7–5.3)
SODIUM: 142 meq/L (ref 137–147)
Total Bilirubin: 0.4 mg/dL (ref 0.3–1.2)
Total Protein: 7.8 g/dL (ref 6.0–8.3)

## 2014-02-19 LAB — CBC WITH DIFFERENTIAL/PLATELET
BASOS ABS: 0 10*3/uL (ref 0.0–0.1)
Basophils Relative: 0 % (ref 0–1)
Eosinophils Absolute: 0.1 10*3/uL (ref 0.0–0.7)
Eosinophils Relative: 1 % (ref 0–5)
HCT: 38.4 % (ref 36.0–46.0)
Hemoglobin: 12.6 g/dL (ref 12.0–15.0)
LYMPHS ABS: 1.1 10*3/uL (ref 0.7–4.0)
LYMPHS PCT: 16 % (ref 12–46)
MCH: 25.9 pg — ABNORMAL LOW (ref 26.0–34.0)
MCHC: 32.8 g/dL (ref 30.0–36.0)
MCV: 79 fL (ref 78.0–100.0)
Monocytes Absolute: 0.5 10*3/uL (ref 0.1–1.0)
Monocytes Relative: 8 % (ref 3–12)
NEUTROS ABS: 5.2 10*3/uL (ref 1.7–7.7)
NEUTROS PCT: 76 % (ref 43–77)
PLATELETS: 184 10*3/uL (ref 150–400)
RBC: 4.86 MIL/uL (ref 3.87–5.11)
RDW: 16 % — AB (ref 11.5–15.5)
WBC: 6.9 10*3/uL (ref 4.0–10.5)

## 2014-02-19 MED ORDER — FLUTICASONE PROPIONATE 50 MCG/ACT NA SUSP: 2.0000 | Freq: Every day | NASAL | Status: DC

## 2014-02-19 MED ORDER — POTASSIUM CHLORIDE CRYS ER 20 MEQ PO TBCR
40.0000 meq | EXTENDED_RELEASE_TABLET | Freq: Once | ORAL | Status: AC
  Administered 2014-02-19: 40 meq via ORAL
  Filled 2014-02-19: qty 2

## 2014-02-19 MED ORDER — SODIUM CHLORIDE 0.9 % IV BOLUS (SEPSIS)
1000.0000 mL | Freq: Once | INTRAVENOUS | Status: AC
  Administered 2014-02-19: 1000 mL via INTRAVENOUS

## 2014-02-19 MED ORDER — IPRATROPIUM-ALBUTEROL 0.5-2.5 (3) MG/3ML IN SOLN
3.0000 mL | RESPIRATORY_TRACT | Status: DC
  Administered 2014-02-19: 3 mL via RESPIRATORY_TRACT
  Filled 2014-02-19: qty 3

## 2014-02-19 MED ORDER — ALBUTEROL SULFATE HFA 108 (90 BASE) MCG/ACT IN AERS
2.0000 | INHALATION_SPRAY | Freq: Once | RESPIRATORY_TRACT | Status: AC
  Administered 2014-02-19: 2 via RESPIRATORY_TRACT
  Filled 2014-02-19: qty 6.7

## 2014-02-19 MED ORDER — KETOROLAC TROMETHAMINE 30 MG/ML IJ SOLN
30.0000 mg | Freq: Once | INTRAMUSCULAR | Status: AC
  Administered 2014-02-19: 30 mg via INTRAVENOUS
  Filled 2014-02-19: qty 1

## 2014-02-19 NOTE — Discharge Instructions (Signed)
Take tylenol and motrin for fever, body aches.   Use albuterol every 4 hrs as needed for cough and congestion.   Try flonase twice a day for nasal congestion.   Follow up with your doctor.   Return to ER if you have worse cough, fever, trouble breathing.

## 2014-02-19 NOTE — ED Notes (Signed)
Pt c/o URI symptoms x 3 days 

## 2014-02-19 NOTE — ED Notes (Signed)
MD at bedside. 

## 2014-02-19 NOTE — Patient Instructions (Signed)
Instructed patient on the proper use of administering albuterol mdi via aerochamber patient tolerated well 

## 2014-02-19 NOTE — ED Provider Notes (Signed)
CSN: [id number]     Arrival date & time 02/19/14  1958 History  This chart was scribed for Wandra Arthurs, MD by Einar Pheasant, ED Scribe. This patient was seen in room MH09/MH09 and the patient's care was started at 8:09 PM.    Chief Complaint  Patient presents with  . URI   The history is provided by the patient. No language interpreter was used.   HPI Comments: Alicia Pena is a 43 y.o. female with hx of breast cancer presents to the Emergency Department complaining of URI symptoms that started today. Pt states that she started feeling really bad at work. She states that her symptoms began with numbness and tingling to her hands and face. Following that she took over the counter meds, which stopped the tingling to the hands but not the face. Pt states that she has been coughing a lot. She is also complaining of associated sore throat, SOB, and possible fever. Current ED temperature is 99.9. Denies any other pertinent medical history. No chest pain, dizziness, or headaches.    Past Medical History  Diagnosis Date  . Anemia   . Anxiety     does not like needles ..   . 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 Breast cancer 03/21/2012    left multifocal invasive, in situ ca,margins not involved,ER/PR=+,metastatic (1/1) node  . Allergy   . Complication of anesthesia   . History of radiation therapy 10/08/2012-11/21/2012    50.4 gray to left chest wall and supraclavicular region   Past Surgical History  Procedure Laterality Date  . Leg surgery      LLE  . Eye surgery      as a child  . Mastectomy complete / simple w/ sentinel node biopsy  03/21/2012    left Dr.Matthew Donne Hazel  . Breast biopsy  89/07/2011    left breast uoq bx=invasive ductal ca  . Breast biopsy bi lateral  02/15/12    left = invasive ductal, right breast= fibroadenoma,fibrocystic changes,no atypia,hyperlasia or malignancy identified  . Abdominal hysterectomy  2006    w/o oophorectomy  . Axillary lymph node dissection   04/04/2012    Procedure: AXILLARY LYMPH NODE DISSECTION;  Surgeon: Rolm Bookbinder, MD;  Location: [facility];  Service: General;  Laterality: Left;  Left Axillary lymph node dissection, Port a cath placement  . Portacath placement  04/04/2012    Procedure: INSERTION PORT-A-CATH;  Surgeon: Rolm Bookbinder, MD;  Location: [facility];  Service: General;  Laterality: Right;   Family History  Problem Relation Age of Onset  . Cancer Father 41    lung  . Diabetes Maternal Grandmother   . Hyperlipidemia Maternal Grandmother   . Hypertension Maternal Grandmother    History  Substance Use Topics  . Smoking status: Never Smoker   . Smokeless tobacco: Never Used  . Alcohol Use: No     Comment: used birth control pills 6 months,G)PO   OB History   Grav Para Term Preterm Abortions TAB SAB Ect Mult Living                 Review of Systems  Constitutional: Positive for fever.  HENT: Positive for sore throat.   Respiratory: Positive for cough and shortness of breath.   Neurological: Positive for numbness.  All other systems reviewed and are negative.     Allergies  Duricef  Home Medications   Prior to Admission medications   Medication Sig Start Date  End Date Taking? Authorizing Provider  acetaminophen (TYLENOL) 325 MG tablet Take 650 mg by mouth every 6 (six) hours as needed.    Historical Provider, MD  ALPRAZolam Duanne Moron) 0.25 MG tablet Take 1 tablet (0.25 mg total) by mouth at bedtime as needed for sleep. 06/20/13   Minette Headland, NP  bisoprolol (ZEBETA) 10 MG tablet Take 0.5 tablets (5 mg total) by mouth daily. 05/12/13   Amy D Ninfa Meeker, NP  ergocalciferol (VITAMIN D2) 50000 UNITS capsule Take 1 capsule (50,000 Units total) by mouth once a week. 12/09/13   Minette Headland, NP  ibuprofen (ADVIL,MOTRIN) 200 MG tablet Take 200 mg by mouth every 6 (six) hours as needed.    Historical Provider, MD  lidocaine-prilocaine (EMLA) cream Apply topically as  needed. 11/01/12   Minette Headland, NP  LORazepam (ATIVAN) 1 MG tablet  02/27/12   Historical Provider, MD  Multiple Vitamin (MULTIVITAMIN WITH MINERALS) TABS Take 1 tablet by mouth daily.    Historical Provider, MD  Multiple Vitamins-Iron (ONE-TABLET-DAILY/IRON PO) Take 1 tablet by mouth daily.    Historical Provider, MD  tamoxifen (NOLVADEX) 20 MG tablet Take 1 tablet (20 mg total) by mouth daily. 05/09/13   Minette Headland, NP  venlafaxine XR (EFFEXOR-XR) 75 MG 24 hr capsule Take 1 capsule (75 mg total) by mouth daily with breakfast. 05/09/13   Minette Headland, NP   BP 129/97  Pulse 120  Temp(Src) 99.9 F (37.7 C) (Oral)  Resp 16  Ht 5' (1.524 m)  Wt 167 lb (75.751 kg)  BMI 32.62 kg/m2  SpO2 99%  Physical Exam  Nursing note and vitals reviewed. Constitutional: She is oriented to person, place, and time. She appears well-developed and well-nourished. No distress.  HENT:  Head: Normocephalic and atraumatic.  Right Ear: External ear normal.  Left Ear: External ear normal.  Nose: Nose normal.  Mouth/Throat: Oropharynx is clear and moist.  Eyes: Conjunctivae and EOM are normal. Pupils are equal, round, and reactive to light.  Neck: Normal range of motion. Neck supple.  Cardiovascular: Normal rate.   Pulmonary/Chest: Effort normal. No respiratory distress. She has wheezes (worse on the right side).  Musculoskeletal: Normal range of motion.  Neurological: She is alert and oriented to person, place, and time. She has normal strength. No cranial nerve deficit or sensory deficit.  Sensation intact throughout. CN 2-12 intact. Nl strength throughout   Skin: Skin is warm and dry.  Psychiatric: She has a normal mood and affect. Her behavior is normal.    ED Course  Procedures (including critical care time)  DIAGNOSTIC STUDIES: Oxygen Saturation is 99% on RA, normal by my interpretation.    COORDINATION OF CARE: 8:19 PM- Pt advised of plan for treatment and pt  agrees.  Medications  ipratropium-albuterol (DUONEB) 0.5-2.5 (3) MG/3ML nebulizer solution 3 mL (3 mLs Nebulization Given 02/19/14 2021)  sodium chloride 0.9 % bolus 1,000 mL (1,000 mLs Intravenous New Bag/Given 02/19/14 2105)  ketorolac (TORADOL) 30 MG/ML injection 30 mg (30 mg Intravenous Given 02/19/14 2111)  potassium chloride SA (K-DUR,KLOR-CON) CR tablet 40 mEq (40 mEq Oral Given 02/19/14 2216)    Labs Review Labs Reviewed  CBC WITH DIFFERENTIAL - Abnormal; Notable for the following:    MCH 25.9 (*)    RDW 16.0 (*)    All other components within normal limits  COMPREHENSIVE METABOLIC PANEL - Abnormal; Notable for the following:    Potassium 3.1 (*)    Glucose, Bld 124 (*)  Anion gap 16 (*)    All other components within normal limits    Imaging Review Dg Chest 2 View  02/19/2014   CLINICAL DATA:  Cough.  Congestion.  EXAM: CHEST  2 VIEW  COMPARISON:  04/04/2012  FINDINGS: There is a right subclavian porta catheter with tip at the SVC. Changes of left mastectomy and axillary dissection.  Normal heart size. Negative upper mediastinal contours. There is no edema, consolidation, effusion, or pneumothorax.  IMPRESSION: No active cardiopulmonary disease.   Electronically Signed   By: Jorje Guild M.D.   On: 02/19/2014 22:14    MDM   Final diagnoses:  None   Oluwaseyi Tull is a 43 y.o. female here with URI symptoms. Has some wheezing on exam. Had some paresthesias that resolved. Nonfocal neuro exam now. After a neb, no wheezing. CXR clear. I think she likely has bronchitis. Will d/c home on albuterol prn. Also K 3.1, supplemented. Recommend recheck in a week. Finally, will give nasal steroids for sinus congestion.     I personally performed the services described in this documentation, which was scribed in my presence. The recorded information has been reviewed and is accurate.    Wandra Arthurs, MD 02/19/14 2224

## 2014-02-23 ENCOUNTER — Telehealth (INDEPENDENT_AMBULATORY_CARE_PROVIDER_SITE_OTHER): Payer: Self-pay

## 2014-02-23 NOTE — Telephone Encounter (Signed)
Message copied by Illene Regulus on Mon Feb 23, 2014  5:18 PM ------      Message from: Darcey Nora      Created: Thu Feb 19, 2014  3:48 PM       Pt called back states that if you dont call her back today we need to call her at [phone] before 2 pm after 2 please call her at [phone].      sonya ------

## 2014-02-23 NOTE — Telephone Encounter (Signed)
LMOM at work/tried calling home but told me to call work. Returned pt's call.

## 2014-02-25 ENCOUNTER — Telehealth (INDEPENDENT_AMBULATORY_CARE_PROVIDER_SITE_OTHER): Payer: Self-pay

## 2014-02-25 NOTE — Telephone Encounter (Signed)
error 

## 2014-02-25 NOTE — Telephone Encounter (Signed)
LMOM returning pt's call again.

## 2014-03-02 ENCOUNTER — Ambulatory Visit
Admission: RE | Admit: 2014-03-02 | Discharge: 2014-03-02 | Disposition: A | Discharge status: Home / Self Care | Payer: PRIVATE HEALTH INSURANCE | Source: Ambulatory Visit | Attending: General Surgery | Admitting: General Surgery

## 2014-03-02 DIAGNOSIS — Z853 Personal history of malignant neoplasm of breast: Secondary | ICD-10-CM

## 2014-03-02 DIAGNOSIS — Z9012 Acquired absence of left breast and nipple: Secondary | ICD-10-CM

## 2014-05-04 ENCOUNTER — Telehealth: Payer: Self-pay | Admitting: *Deleted

## 2014-05-04 ENCOUNTER — Encounter: Payer: Self-pay | Admitting: Nurse Practitioner

## 2014-05-04 ENCOUNTER — Ambulatory Visit (HOSPITAL_COMMUNITY)
Admission: RE | Admit: 2014-05-04 | Discharge: 2014-05-04 | Disposition: A | Discharge status: Home / Self Care | Payer: Commercial Managed Care - PPO | Source: Ambulatory Visit | Attending: Nurse Practitioner | Admitting: Nurse Practitioner

## 2014-05-04 ENCOUNTER — Ambulatory Visit (HOSPITAL_BASED_OUTPATIENT_CLINIC_OR_DEPARTMENT_OTHER): Payer: Commercial Managed Care - PPO | Admitting: Nurse Practitioner

## 2014-05-04 VITALS — BP 142/91 | HR 71 | Temp 98.6°F | Resp 18 | Ht 60.0 in | Wt 166.5 lb

## 2014-05-04 DIAGNOSIS — R609 Edema, unspecified: Secondary | ICD-10-CM

## 2014-05-04 DIAGNOSIS — C50919 Malignant neoplasm of unspecified site of unspecified female breast: Secondary | ICD-10-CM | POA: Diagnosis not present

## 2014-05-04 DIAGNOSIS — I890 Lymphedema, not elsewhere classified: Secondary | ICD-10-CM

## 2014-05-04 DIAGNOSIS — C50812 Malignant neoplasm of overlapping sites of left female breast: Secondary | ICD-10-CM

## 2014-05-04 DIAGNOSIS — C50412 Malignant neoplasm of upper-outer quadrant of left female breast: Secondary | ICD-10-CM

## 2014-05-04 DIAGNOSIS — I10 Essential (primary) hypertension: Secondary | ICD-10-CM | POA: Insufficient documentation

## 2014-05-04 NOTE — Progress Notes (Signed)
Left upper extremity venous duplex completed:  No evidence of DVT or superficial thrombosis.    

## 2014-05-04 NOTE — Progress Notes (Signed)
SYMPTOM MANAGEMENT CLINIC   HPI: Alicia Pena 43 y.o. female diagnosed with breast cancer.  Patient is status post chemotherapy and radiation therapy.  Patient underwent a left breast mastectomy in October 2013.  She is currently undergoing tamoxifen therapy.   Patient called the cancer Center today requesting urgent care visit.  She states she has been experiencing some left upper extremity edemafor the past 2 weeks.  She denies any known injury or trauma to this extremity.  She denies any erythema, warmth, specific tenderness, or red streaks to her arm.  Patient has a history of chronic cerebral palsy as her baseline.  She recently got married within this past month or so.  Patient states she's been hesitant to have her right upper chest Port-A-Cath removed; stating that she has been waiting until after her wedding to arrange removal of report.   HPI  ROS  Past Medical History  Diagnosis Date  . Anemia   . Anxiety     does not like needles ..   . 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 Breast cancer 03/21/2012    left multifocal invasive, in situ ca,margins not involved,ER/PR=+,metastatic (1/1) node  . Allergy   . Complication of anesthesia   . History of radiation therapy 10/08/2012-11/21/2012    50.4 gray to left chest wall and supraclavicular region    Past Surgical History  Procedure Laterality Date  . Leg surgery      LLE  . Eye surgery      as a child  . Mastectomy complete / simple w/ sentinel node biopsy  03/21/2012    left Dr.Matthew Donne Hazel  . Breast biopsy  89/07/2011    left breast uoq bx=invasive ductal ca  . Breast biopsy bi lateral  02/15/12    left = invasive ductal, right breast= fibroadenoma,fibrocystic changes,no atypia,hyperlasia or malignancy identified  . Abdominal hysterectomy  2006    w/o oophorectomy  . Axillary lymph node dissection  04/04/2012    Procedure: AXILLARY LYMPH NODE DISSECTION;  Surgeon: Rolm Bookbinder, MD;  Location: [facility];  Service: General;  Laterality: Left;  Left Axillary lymph node dissection, Port a cath placement  . Portacath placement  04/04/2012    Procedure: INSERTION PORT-A-CATH;  Surgeon: Rolm Bookbinder, MD;  Location: [facility];  Service: General;  Laterality: Right;    has Cancer of upper-outer quadrant of female breast; Breast cancer; Edema of lower extremity; Chemotherapy induced cardiomyopathy; Cerebral palsy; Lymphedema of upper extremity; and Hypertension on her problem list.     is allergic to duricef.    Medication List       This list is accurate as of: 05/04/14  4:13 PM.  Always use your most recent med list.               acetaminophen 325 MG tablet  Commonly known as:  TYLENOL  Take 650 mg by mouth every 6 (six) hours as needed.     ALPRAZolam 0.25 MG tablet  Commonly known as:  XANAX  Take 1 tablet (0.25 mg total) by mouth at bedtime as needed for sleep.     bisoprolol 10 MG tablet  Commonly known as:  ZEBETA  Take 0.5 tablets (5 mg total) by mouth daily.     ergocalciferol 50000 UNITS capsule  Commonly known as:  VITAMIN D2  Take 1 capsule (50,000 Units total) by mouth once a week.     fluticasone 50 MCG/ACT nasal spray  Commonly known as:  FLONASE  Place 2 sprays into both nostrils daily.     ibuprofen 200 MG tablet  Commonly known as:  ADVIL,MOTRIN  Take 200 mg by mouth every 6 (six) hours as needed.     lidocaine-prilocaine cream  Commonly known as:  EMLA  Apply topically as needed.     LORazepam 1 MG tablet  Commonly known as:  ATIVAN     multivitamin with minerals Tabs tablet  Take 1 tablet by mouth daily.     ONE-TABLET-DAILY/IRON PO  Take 1 tablet by mouth daily.     tamoxifen 20 MG tablet  Commonly known as:  NOLVADEX  Take 1 tablet (20 mg total) by mouth daily.     venlafaxine XR 75 MG 24 hr capsule  Commonly known as:  EFFEXOR-XR  Take 1 capsule (75 mg total) by mouth daily with breakfast.          PHYSICAL EXAMINATION  Blood pressure 142/91, pulse 71, temperature 98.6 F (37 C), temperature source Oral, resp. rate 18, height 5' (1.524 m), weight 166 lb 8 oz (75.524 kg).  Physical Exam  Constitutional: She is oriented to person, place, and time and well-developed, well-nourished, and in no distress.  HENT:  Head: Normocephalic and atraumatic.  Mouth/Throat: Oropharynx is clear and moist.  Eyes: Conjunctivae and EOM are normal. Pupils are equal, round, and reactive to light. No scleral icterus.  Neck: Normal range of motion. Neck supple. No JVD present. No tracheal deviation present. No thyromegaly present.  Cardiovascular: Normal rate, regular rhythm, normal heart sounds and intact distal pulses.   Pulmonary/Chest: Effort normal and breath sounds normal. No respiratory distress. She has no wheezes.  Abdominal: Soft. Bowel sounds are normal. She exhibits no distension. There is no tenderness. There is no rebound.  Musculoskeletal: Normal range of motion. She exhibits edema. She exhibits no tenderness.  Entire left upper extremity with edema; but no erythema, no warmth, no tenderness, and no red streaks.  All pulses are palpable in all extremities are warm.  Patient observed with full range of motion with all extremities.  Lymphadenopathy:    She has no cervical adenopathy.  Neurological: She is alert and oriented to person, place, and time.  Skin: Skin is warm and dry. No rash noted. No erythema.  Psychiatric: Affect normal.  Nursing note and vitals reviewed.   LABORATORY DATA:. No visits with results within 3 Day(s) from this visit. Latest known visit with results is:  Admission on 02/19/2014, Discharged on 02/19/2014  Component Date Value Ref Range Status  . WBC 02/19/2014 6.9  4.0 - 10.5 K/uL Final  . RBC 02/19/2014 4.86  3.87 - 5.11 MIL/uL Final  . Hemoglobin 02/19/2014 12.6  12.0 - 15.0 g/dL Final  . HCT 02/19/2014 38.4  36.0 - 46.0 % Final  . MCV 02/19/2014 79.0   78.0 - 100.0 fL Final  . MCH 02/19/2014 25.9* 26.0 - 34.0 pg Final  . MCHC 02/19/2014 32.8  30.0 - 36.0 g/dL Final  . RDW 02/19/2014 16.0* 11.5 - 15.5 % Final  . Platelets 02/19/2014 184  150 - 400 K/uL Final  . Neutrophils Relative % 02/19/2014 76  43 - 77 % Final  . Neutro Abs 02/19/2014 5.2  1.7 - 7.7 K/uL Final  . Lymphocytes Relative 02/19/2014 16  12 - 46 % Final  . Lymphs Abs 02/19/2014 1.1  0.7 - 4.0 K/uL Final  . Monocytes Relative 02/19/2014 8  3 - 12 % Final  .  Monocytes Absolute 02/19/2014 0.5  0.1 - 1.0 K/uL Final  . Eosinophils Relative 02/19/2014 1  0 - 5 % Final  . Eosinophils Absolute 02/19/2014 0.1  0.0 - 0.7 K/uL Final  . Basophils Relative 02/19/2014 0  0 - 1 % Final  . Basophils Absolute 02/19/2014 0.0  0.0 - 0.1 K/uL Final  . Sodium 02/19/2014 142  137 - 147 mEq/L Final  . Potassium 02/19/2014 3.1* 3.7 - 5.3 mEq/L Final  . Chloride 02/19/2014 103  96 - 112 mEq/L Final  . CO2 02/19/2014 23  19 - 32 mEq/L Final  . Glucose, Bld 02/19/2014 124* 70 - 99 mg/dL Final  . BUN 02/19/2014 10  6 - 23 mg/dL Final  . Creatinine, Ser 02/19/2014 0.60  0.50 - 1.10 mg/dL Final  . Calcium 02/19/2014 9.4  8.4 - 10.5 mg/dL Final  . Total Protein 02/19/2014 7.8  6.0 - 8.3 g/dL Final  . Albumin 02/19/2014 3.5  3.5 - 5.2 g/dL Final  . AST 02/19/2014 35  0 - 37 U/L Final  . ALT 02/19/2014 32  0 - 35 U/L Final  . Alkaline Phosphatase 02/19/2014 99  39 - 117 U/L Final  . Total Bilirubin 02/19/2014 0.4  0.3 - 1.2 mg/dL Final  . GFR calc non Af Amer 02/19/2014 >90  >90 mL/min Final  . GFR calc Af Amer 02/19/2014 >90  >90 mL/min Final   Comment: (NOTE)                          The eGFR has been calculated using the CKD EPI equation.                          This calculation has not been validated in all clinical situations.                          eGFR's persistently <90 mL/min signify possible Chronic Kidney                          Disease.  . Anion gap 02/19/2014 16* 5 - 15 Final    Doppler US:  Charlaine Dalton, RVT at 05/04/2014 2:53 PM     Status: Signed       Expand All Collapse All   Left upper extremity venous duplex completed: No evidence of DVT or superficial thrombosis.        RADIOGRAPHIC STUDIES: No results found.  ASSESSMENT/PLAN:    Assessment & plan notes cannot be loaded without a specified hospital service.   Patient stated understanding of all instructions; and was in agreement with this plan of care. The patient knows to call the clinic with any problems, questions or concerns.   Review/collaboration with Charlestine Massed, NP and Dr. Lindi Adie regarding all aspects of patient's visit today.   Total time spent with patient was 40 minutes;  with greater than 80 percent of that time spent in face to face counseling regarding her symptoms, review of her ultrasound results, and coordination of care and follow up.  Disclaimer: This note was dictated with voice recognition software. Similar sounding words can inadvertently be transcribed and may not be corrected upon review.   Drue Second, NP 05/04/2014

## 2014-05-04 NOTE — Telephone Encounter (Signed)
   Provider input needed: LEFT HAND SWELLING,TINGLING,STIFFNESS AND PAIN WITH MOVEMENT.   Reason for call: LEFT HAND SWELLING  Musculoskeletal:positive for stiff joints and SWELLING   ALLERGIES:  is allergic to duricef.  Patient last received chemotherapy/ treatment on n/a  Patient was last seen in the office on 12/09/13   Next appt is 06/16/14  Is patient having fevers greater than 100.5?  N/A   Is patient having uncontrolled pain, or new pain? yes, IN LEFT HAND WITH MOVEMENT.   Is patient having new back pain that changes with position (worsens or eases when laying down?)  no   Is patient able to eat and drink? N/A    Is patient able to pass stool without difficulty?   N/A     Is patient having uncontrolled nausea?  no    patient calls 05/04/2014 with complaint of  SWELLING IN LEFT HAND.   Summary Based on the above information advised patient to  SEE CINDEE BACON,NP.   Waverly Ferrari M  05/04/2014, 11:31 AM   Background Info  Dhwani Venkatesh   DOB: [birth year]   MR#: [id number]   CSN#   [id number] 05/04/2014

## 2014-05-04 NOTE — Assessment & Plan Note (Signed)
Patient is status post chemotherapy and radiation therapy.  She also underwent a left mastectomy in October 2013.  She continues to take tamoxifen on a daily basis.  She is scheduled for a lab draw on the super 8 2015.  She is scheduled for a followup visit here at the [facility] on 06/16/2014.  Patient is also advised to contact Dr. Donne Hazel to have her right upper chest Port-A-Cath removed.

## 2014-05-04 NOTE — Assessment & Plan Note (Signed)
Patient's blood pressure was 142/91 while at the cancer Center today.  Patient takes bisoprolol per her primary care physician.  Advised patient to recheck her blood pressure when she returns back home; and let us know if it remains elevated.  Also, will have patient follow up with her primary care physician regarding blood pressure medication management.

## 2014-05-04 NOTE — Assessment & Plan Note (Signed)
Left upper extremity Doppler ultrasound was negative for DVT.  There was no evidence of cellulitis on exam.  All pulses were palpable and all extremities were warm.  Patient had full range of motion with her left upper extremity.  Have ordered a lymphedema clinic referral for both lymphedema massage techniques instruction; as well as a compression sleeve.  Advised patient to call and let us know if her left upper extremity edema worsens; or she develops any other new symptoms.

## 2014-05-05 ENCOUNTER — Telehealth: Payer: Self-pay | Admitting: *Deleted

## 2014-05-05 NOTE — Telephone Encounter (Addendum)
Late entry:  Pt here yest to see NP, Selena Lesser.  She was given a note to excuse her from work 05/04/14 due to being seen in the office for medical problems.  She was also given a script for graduated compression sleeve for swelling in her left hand/Breast CA. Reached pt this pm & discussed BP.  Informed that BP was 142/91 yest & asked if she was taking bisoprolol.  She states Dr. Haroldine Laws had her on it when she was taking herceptin but she hasn't taken it since probably Jan 2015. Suggested that she take her BP for a while & if continues to be elevated she needs to f/u with her PCP.  She states she will have the clinic at work take her BP & understands to f/u with PCP.  Pt was also informed that her doppler done yest was negative.

## 2014-05-07 ENCOUNTER — Telehealth: Payer: Self-pay | Admitting: Physical Therapy

## 2014-05-07 ENCOUNTER — Ambulatory Visit: Payer: Commercial Managed Care - PPO | Attending: Hematology and Oncology | Admitting: Physical Therapy

## 2014-05-07 NOTE — Telephone Encounter (Signed)
Pt no show today for Lympedema appt, [facility] nurse notified

## 2014-05-19 ENCOUNTER — Telehealth: Payer: Self-pay

## 2014-05-19 NOTE — Telephone Encounter (Signed)
Message rcvd from [facility] at [place] = pt FTKA.

## 2014-06-09 ENCOUNTER — Other Ambulatory Visit: Payer: PRIVATE HEALTH INSURANCE

## 2014-06-16 ENCOUNTER — Ambulatory Visit: Payer: Commercial Managed Care - PPO | Admitting: Hematology and Oncology

## 2015-01-20 ENCOUNTER — Other Ambulatory Visit: Payer: Self-pay | Admitting: General Surgery

## 2015-01-20 DIAGNOSIS — Z853 Personal history of malignant neoplasm of breast: Secondary | ICD-10-CM

## 2015-01-20 DIAGNOSIS — Z1231 Encounter for screening mammogram for malignant neoplasm of breast: Secondary | ICD-10-CM

## 2015-03-04 ENCOUNTER — Ambulatory Visit
Admission: RE | Admit: 2015-03-04 | Discharge: 2015-03-04 | Disposition: A | Discharge status: Home / Self Care | Payer: PRIVATE HEALTH INSURANCE | Source: Ambulatory Visit | Attending: General Surgery | Admitting: General Surgery

## 2015-03-04 DIAGNOSIS — Z1231 Encounter for screening mammogram for malignant neoplasm of breast: Secondary | ICD-10-CM

## 2015-03-04 DIAGNOSIS — Z853 Personal history of malignant neoplasm of breast: Secondary | ICD-10-CM

## 2016-01-31 ENCOUNTER — Other Ambulatory Visit: Payer: Self-pay | Admitting: General Surgery

## 2016-01-31 DIAGNOSIS — Z1231 Encounter for screening mammogram for malignant neoplasm of breast: Secondary | ICD-10-CM

## 2016-03-07 ENCOUNTER — Ambulatory Visit
Admission: RE | Admit: 2016-03-07 | Discharge: 2016-03-07 | Disposition: A | Discharge status: Home / Self Care | Payer: Commercial Managed Care - PPO | Source: Ambulatory Visit | Attending: General Surgery | Admitting: General Surgery

## 2016-03-07 DIAGNOSIS — Z1231 Encounter for screening mammogram for malignant neoplasm of breast: Secondary | ICD-10-CM

## 2017-01-10 ENCOUNTER — Other Ambulatory Visit: Payer: Self-pay | Admitting: General Surgery

## 2017-01-10 DIAGNOSIS — Z1231 Encounter for screening mammogram for malignant neoplasm of breast: Secondary | ICD-10-CM

## 2017-03-09 ENCOUNTER — Ambulatory Visit
Admission: RE | Admit: 2017-03-09 | Discharge: 2017-03-09 | Disposition: A | Discharge status: Home / Self Care | Payer: Commercial Managed Care - PPO | Source: Ambulatory Visit | Attending: General Surgery | Admitting: General Surgery

## 2017-03-09 DIAGNOSIS — Z1231 Encounter for screening mammogram for malignant neoplasm of breast: Secondary | ICD-10-CM

## 2018-01-23 ENCOUNTER — Other Ambulatory Visit: Payer: Self-pay | Admitting: General Surgery

## 2018-01-23 DIAGNOSIS — Z1231 Encounter for screening mammogram for malignant neoplasm of breast: Secondary | ICD-10-CM

## 2018-03-15 ENCOUNTER — Ambulatory Visit
Admission: RE | Admit: 2018-03-15 | Discharge: 2018-03-15 | Disposition: A | Discharge status: Home / Self Care | Payer: Commercial Managed Care - PPO | Source: Ambulatory Visit | Attending: General Surgery | Admitting: General Surgery

## 2018-03-15 DIAGNOSIS — Z1231 Encounter for screening mammogram for malignant neoplasm of breast: Secondary | ICD-10-CM

## 2018-03-15 HISTORY — DX: Personal history of irradiation: Z92.3

## 2018-03-15 HISTORY — DX: Personal history of antineoplastic chemotherapy: Z92.21

## 2018-07-18 DIAGNOSIS — J209 Acute bronchitis, unspecified: Secondary | ICD-10-CM | POA: Diagnosis not present

## 2018-07-18 DIAGNOSIS — R05 Cough: Secondary | ICD-10-CM | POA: Diagnosis not present

## 2018-09-04 DIAGNOSIS — R1906 Epigastric swelling, mass or lump: Secondary | ICD-10-CM | POA: Diagnosis not present

## 2018-09-11 ENCOUNTER — Other Ambulatory Visit: Payer: Self-pay | Admitting: Physician Assistant

## 2018-09-11 DIAGNOSIS — R1906 Epigastric swelling, mass or lump: Secondary | ICD-10-CM

## 2018-09-20 ENCOUNTER — Other Ambulatory Visit: Payer: Commercial Managed Care - PPO

## 2018-09-26 DIAGNOSIS — R6889 Other general symptoms and signs: Secondary | ICD-10-CM | POA: Diagnosis not present

## 2018-09-26 DIAGNOSIS — J029 Acute pharyngitis, unspecified: Secondary | ICD-10-CM | POA: Diagnosis not present

## 2018-09-27 DIAGNOSIS — R509 Fever, unspecified: Secondary | ICD-10-CM | POA: Diagnosis not present

## 2018-09-27 DIAGNOSIS — R6889 Other general symptoms and signs: Secondary | ICD-10-CM | POA: Diagnosis not present

## 2018-09-27 DIAGNOSIS — J029 Acute pharyngitis, unspecified: Secondary | ICD-10-CM | POA: Diagnosis not present

## 2018-10-04 ENCOUNTER — Other Ambulatory Visit: Payer: Commercial Managed Care - PPO

## 2018-11-15 DIAGNOSIS — H9209 Otalgia, unspecified ear: Secondary | ICD-10-CM | POA: Diagnosis not present

## 2018-11-15 DIAGNOSIS — R030 Elevated blood-pressure reading, without diagnosis of hypertension: Secondary | ICD-10-CM | POA: Diagnosis not present

## 2019-02-11 ENCOUNTER — Other Ambulatory Visit: Payer: Self-pay | Admitting: General Surgery

## 2019-02-11 DIAGNOSIS — Z1231 Encounter for screening mammogram for malignant neoplasm of breast: Secondary | ICD-10-CM

## 2019-03-28 ENCOUNTER — Ambulatory Visit
Admission: RE | Admit: 2019-03-28 | Discharge: 2019-03-28 | Disposition: A | Discharge status: Home / Self Care | Payer: Commercial Managed Care - PPO | Source: Ambulatory Visit | Attending: General Surgery | Admitting: General Surgery

## 2019-03-28 ENCOUNTER — Other Ambulatory Visit: Payer: Self-pay

## 2019-03-28 DIAGNOSIS — Z1231 Encounter for screening mammogram for malignant neoplasm of breast: Secondary | ICD-10-CM

## 2019-11-27 IMAGING — MG MM DIGITAL SCREENING UNILAT*R* W/ TOMO W/ CAD
6 series · 6 of 18 positions shown · non-contrast
Comparison: Previous exam(s).

CLINICAL DATA: Screening.

EXAM:
DIGITAL SCREENING UNILATERAL RIGHT MAMMOGRAM WITH CAD AND TOMO

[R MLO synth-2D]
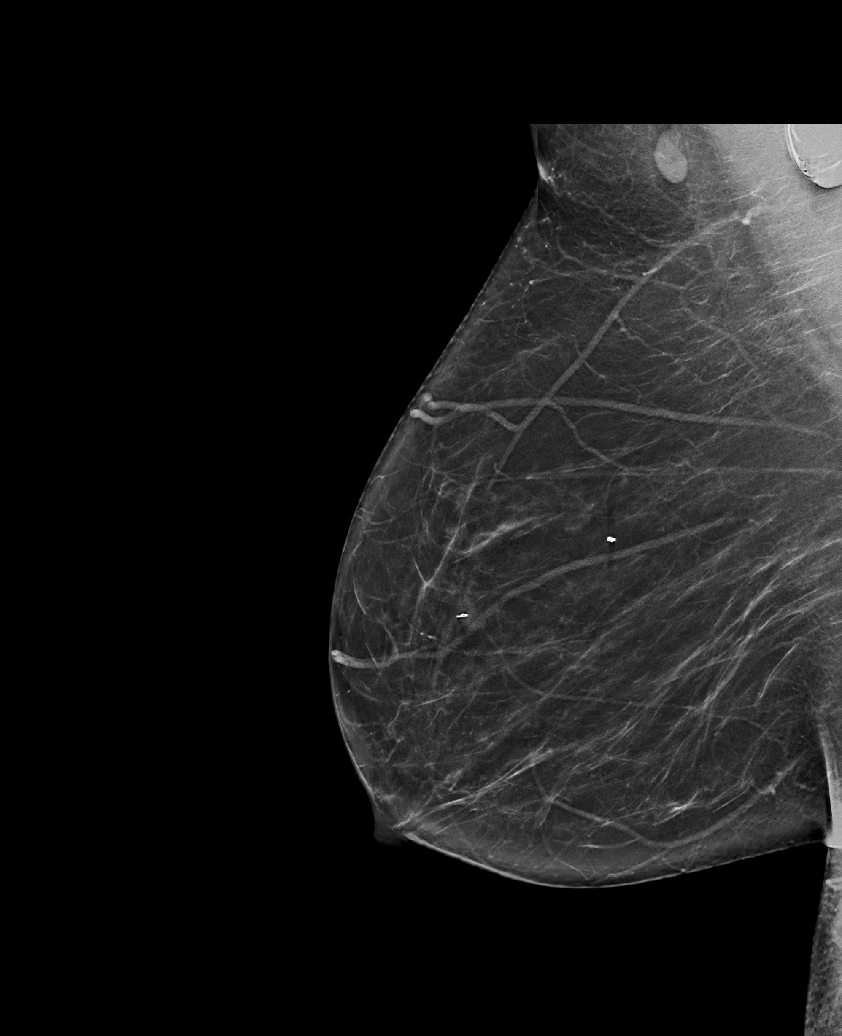

[R CC synth-2D (1 of 2)]
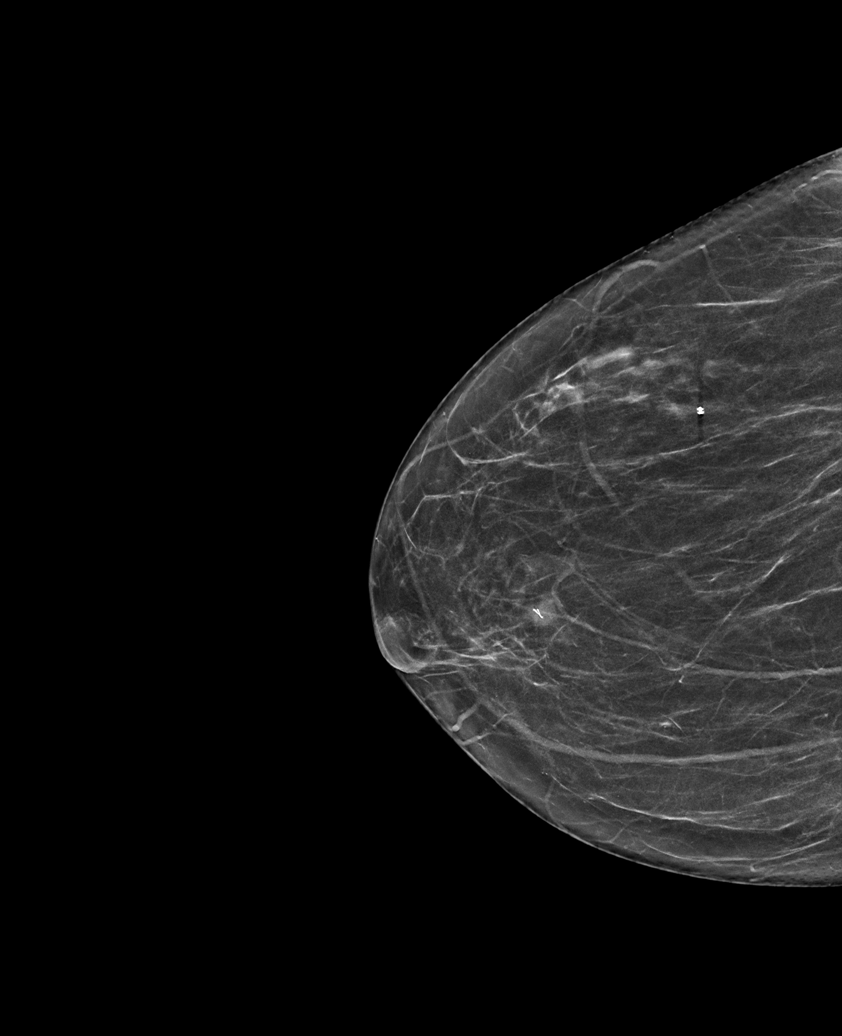

[R CC synth-2D (2 of 2)]
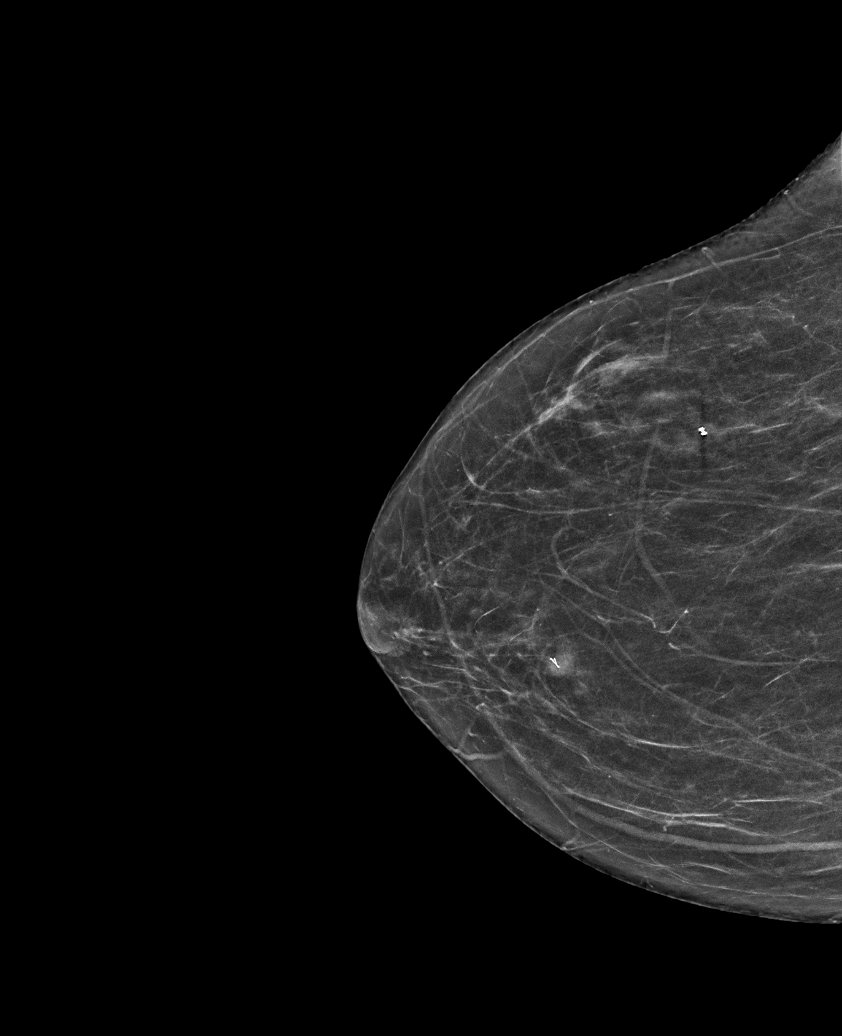

[R CC tomo (1 of 2) · tomo slice 29/57.0]
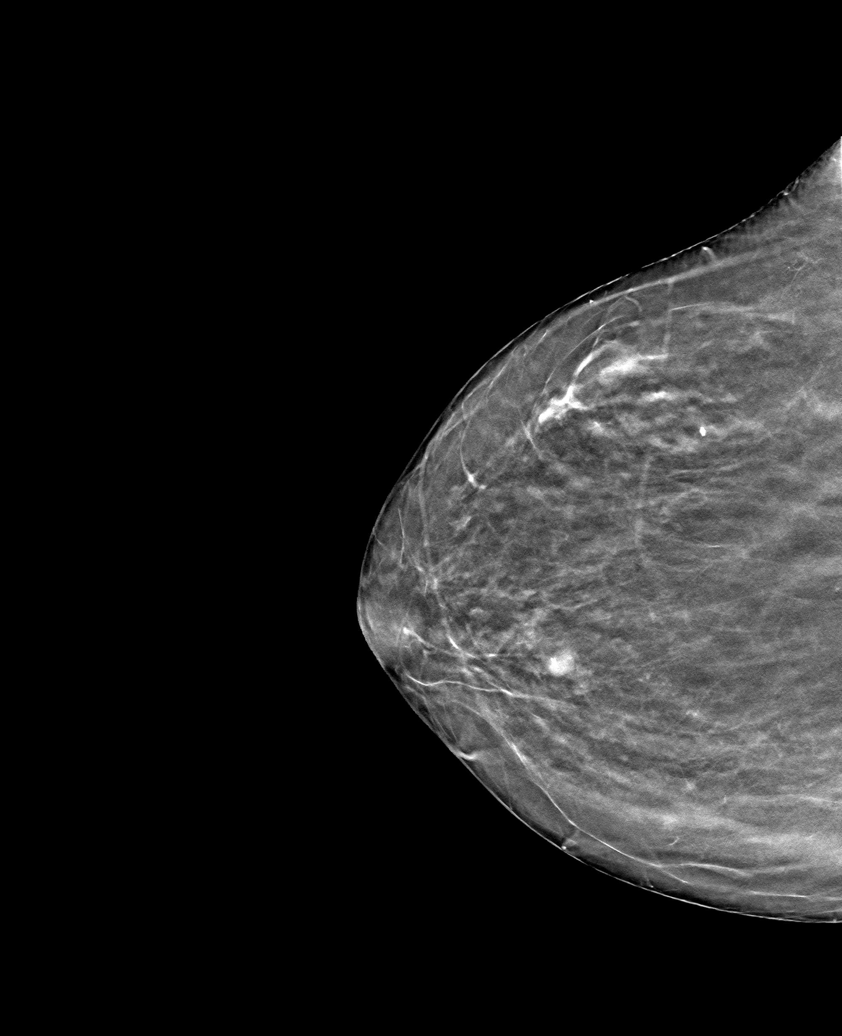

[R MLO tomo · tomo slice 38/75.0]
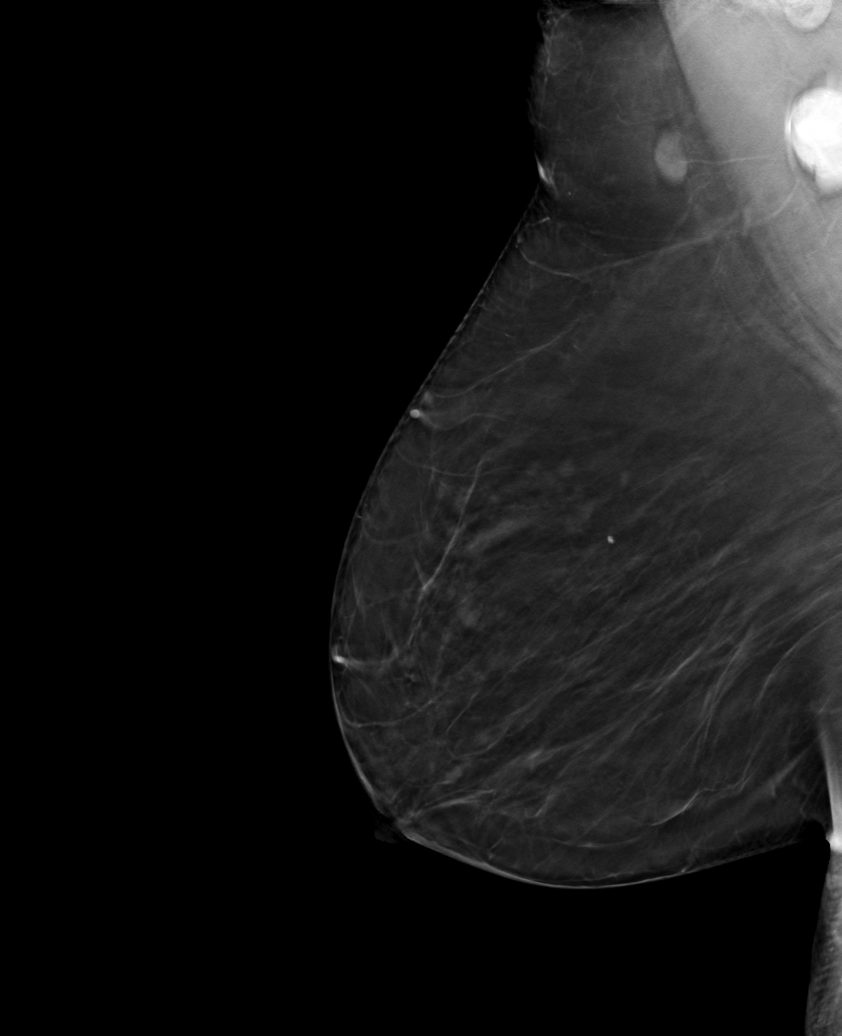

[R CC tomo (2 of 2) · tomo slice 31/61.0]
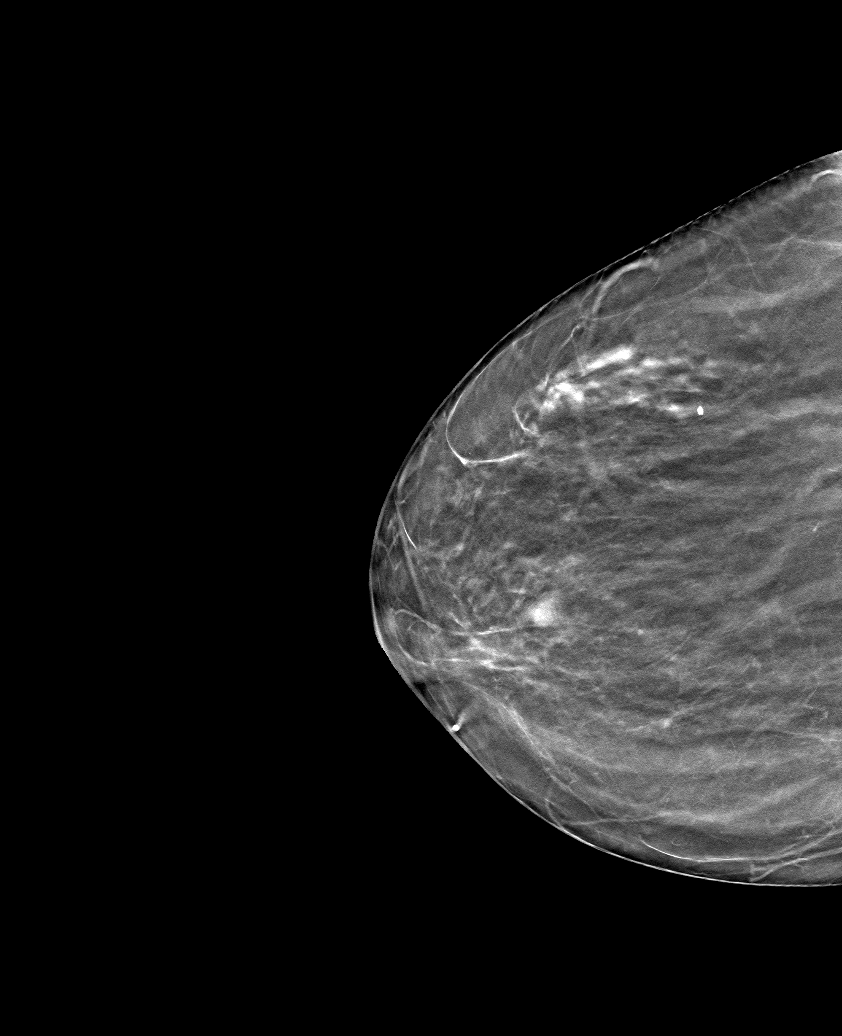

[6 of 18 positions shown; findings below may reference images not displayed]

ACR Breast Density Category b: There are scattered areas of
fibroglandular density.
FINDINGS: There are no findings suspicious for malignancy. Images were
processed with CAD.
IMPRESSION: No mammographic evidence of malignancy. A result letter of this
screening mammogram will be mailed directly to the patient.

RECOMMENDATION:
Screening mammogram in one year. (Code:3Y-2-[facility])

BI-RADS CATEGORY  1: Negative.

## 2019-12-17 ENCOUNTER — Other Ambulatory Visit: Payer: Self-pay | Admitting: General Surgery

## 2019-12-17 DIAGNOSIS — Z1231 Encounter for screening mammogram for malignant neoplasm of breast: Secondary | ICD-10-CM

## 2020-03-30 ENCOUNTER — Ambulatory Visit: Payer: Commercial Managed Care - PPO

## 2020-04-21 ENCOUNTER — Ambulatory Visit: Payer: Self-pay

## 2020-05-03 HISTORY — DX: Morbid (severe) obesity due to excess calories: E66.01

## 2020-05-11 DIAGNOSIS — E559 Vitamin D deficiency, unspecified: Secondary | ICD-10-CM | POA: Insufficient documentation

## 2020-05-11 DIAGNOSIS — Z853 Personal history of malignant neoplasm of breast: Secondary | ICD-10-CM | POA: Insufficient documentation

## 2020-06-24 DIAGNOSIS — C7951 Secondary malignant neoplasm of bone: Secondary | ICD-10-CM | POA: Insufficient documentation

## 2020-07-12 DIAGNOSIS — F411 Generalized anxiety disorder: Secondary | ICD-10-CM | POA: Insufficient documentation

## 2020-07-12 DIAGNOSIS — F331 Major depressive disorder, recurrent, moderate: Secondary | ICD-10-CM | POA: Insufficient documentation

## 2020-07-12 DIAGNOSIS — F4321 Adjustment disorder with depressed mood: Secondary | ICD-10-CM | POA: Insufficient documentation

## 2020-08-24 DIAGNOSIS — I824Z9 Acute embolism and thrombosis of unspecified deep veins of unspecified distal lower extremity: Secondary | ICD-10-CM

## 2020-08-24 HISTORY — DX: Acute embolism and thrombosis of unspecified deep veins of unspecified distal lower extremity: I82.4Z9

## 2021-04-09 ENCOUNTER — Other Ambulatory Visit: Payer: Self-pay | Admitting: Nurse Practitioner

## 2021-08-30 ENCOUNTER — Telehealth: Payer: Self-pay

## 2021-08-30 NOTE — Telephone Encounter (Signed)
Pt wishes to establish care with you. However, she is  ride dependant and would need for her appointment to be at about 3:15 in the evening. I let her know that your latest NPT appointment slot is 2 pm so I would need to check with you for the okay to schedule that late.   CB number is [phone]

## 2021-09-02 NOTE — Telephone Encounter (Signed)
Pt scheduled with Caleen Jobs!  ?

## 2021-09-26 ENCOUNTER — Ambulatory Visit: Payer: Managed Care, Other (non HMO) | Admitting: Family Medicine

## 2021-09-26 ENCOUNTER — Encounter: Payer: Self-pay | Admitting: Family Medicine

## 2021-09-26 VITALS — BP 133/95 | HR 95 | Ht 60.0 in | Wt 199.8 lb

## 2021-09-26 DIAGNOSIS — M6281 Muscle weakness (generalized): Secondary | ICD-10-CM | POA: Diagnosis not present

## 2021-09-26 DIAGNOSIS — Z1231 Encounter for screening mammogram for malignant neoplasm of breast: Secondary | ICD-10-CM | POA: Diagnosis not present

## 2021-09-26 NOTE — Patient Instructions (Signed)
Thank you for choosing [facility] at [facility] for your Primary Care needs. I am excited for the opportunity to partner with you to meet your health care goals. It was a pleasure meeting you today! ? ? ? ?Information on diet, exercise, and health maintenance recommendations are listed below. This is information to help you be sure you are on track for optimal health and monitoring.  ? ?Please look over this and let us know if you have any questions or if you have completed any of the health maintenance outside of [place] so that we can be sure your records are up to date.  ?___________________________________________________________ ? ?MyChart:  ?For all urgent or time sensitive needs we ask that you please call the office to avoid delays. Our number is [phone]. ?MyChart is not constantly monitored and due to the large volume of messages a day, replies may take up to 72 business hours. ? ?MyChart Policy: ?MyChart allows for you to see your visit notes, after visit summary, provider recommendations, lab and tests results, make an appointment, request refills, and contact your provider or the office for non-urgent questions or concerns. Providers are seeing patients during normal business hours and do not have built in time to review MyChart messages.  ?We ask that you allow a minimum of 3 business days for responses to [employer]. For this reason, please do not send urgent requests through [place]. Please call the office at [phone]. ?New and ongoing conditions may require a visit. We have virtual and in-person visits available for your convenience.  ?Complex MyChart concerns may require a visit. Your provider may request you schedule a virtual or in-person visit to ensure we are providing the best care possible. ?MyChart messages sent after 11:00 AM on Friday will not be received by the provider until Monday morning.  ?  ?Lab and Test Results: ?You will receive your lab and  test results on MyChart as soon as they are completed and results have been sent by the lab or testing facility. Due to this service, you will receive your results BEFORE your provider.  ?I review lab and test results each morning prior to seeing patients. Some results require collaboration with other providers to ensure you are receiving the most appropriate care. For this reason, we ask that you please allow a minimum of 3-5 business days from the time that ALL results have been received for your provider to receive and review lab and test results and contact you about these.  ?Most lab and test result comments from the provider will be sent through [place]. Your provider may recommend changes to the plan of care, follow-up visits, repeat testing, ask questions, or request an office visit to discuss these results. You may reply directly to this message or call the office to provide information for the provider or set up an appointment. ?In some instances, you will be called with test results and recommendations. Please let us know if this is preferred and we will make note of this in your chart to provide this for you.    ?If you have not heard a response to your lab or test results in 5 business days from all results returning to [place], please call the office to let us know. We ask that you please avoid calling prior to this time unless there is an emergent concern. Due to high call volumes, this can delay the resulting process. ? ?After Hours: ?For all non-emergency after hours  needs, please call the office at [phone] and select the option to reach the on-call  service. On-call services are shared between multiple [place] offices and therefore it will not be possible to speak directly with your provider. On-call providers may provide medical advice and recommendations, but are unable to provide refills for maintenance medications.  ?For all emergency or urgent medical needs after normal business  hours, we recommend that you seek care at the closest Urgent Care or Emergency Department to ensure appropriate treatment in a timely manner.  ?[facility] [place] has a 24 hour emergency room located on the ground floor for your convenience.  ? ?Urgent Concerns During the Business Day ?Providers are seeing patients from 8AM to [place] with a busy schedule and are most often not able to respond to non-urgent calls until the end of the day or the next business day. ?If you should have URGENT concerns during the day, please call and speak to the nurse or schedule a same day appointment so that we can address your concern without delay.  ? ?Thank you, again, for choosing me as your health care partner. I appreciate your trust and look forward to learning more about you.  ? ?[employer] N[employer]. Olevia Bowens, DNP, FNP-C ? ?___________________________________________________________ ? ?Health Maintenance Recommendations ?Screening Testing ?Mammogram ?Every 1-2 years based on history and risk factors ?Starting at age 43 ?Pap Smear ?Ages 21-39 every 3 years ?Ages 79-65 every 5 years with HPV testing ?More frequent testing may be required based on results and history ?Colon Cancer Screening ?Every 1-10 years based on test performed, risk factors, and history ?Starting at age 55 ?Bone Density Screening ?Every 2-10 years based on history ?Starting at age 26 for women ?Recommendations for men differ based on medication usage, history, and risk factors ?AAA Screening ?One time ultrasound ?Men 50-40 years old who have ever smoked ?Lung Cancer Screening ?Low Dose Lung CT every 12 months ?Age 72-80 years with a 20 pack-year smoking history who still smoke or who have quit within the last 15 years ? ?Screening Labs ?Routine  Labs: Complete Blood Count (CBC), Complete Metabolic Panel (CMP), Cholesterol (Lipid Panel) ?Every 6-12 months based on history and medications ?May be recommended more frequently based on current conditions or  previous results ?Hemoglobin A1c Lab ?Every 3-12 months based on history and previous results ?Starting at age 52 or earlier with diagnosis of diabetes, high cholesterol, BMI >26, and/or risk factors ?Frequent monitoring for patients with diabetes to ensure blood sugar control ?Thyroid Panel (TSH w/ T3 & T4) ?Every 6 months based on history, symptoms, and risk factors ?May be repeated more often if on medication ?HIV ?One time testing for all patients 42 and older ?May be repeated more frequently for patients with increased risk factors or exposure ?Hepatitis C ?One time testing for all patients 26 and older ?May be repeated more frequently for patients with increased risk factors or exposure ?Gonorrhea, Chlamydia ?Every 12 months for all sexually active persons 13-24 years ?Additional monitoring may be recommended for those who are considered high risk or who have symptoms ?PSA ?Men 68-78 years old with risk factors ?Additional screening may be recommended from age 69-69 based on risk factors, symptoms, and history ? ?Vaccine Recommendations ?Tetanus Booster ?All adults every 10 years ?Flu Vaccine ?All patients 6 months and older every year ?COVID Vaccine ?All patients 12 years and older ?Initial dosing with booster ?May recommend additional booster based on age and health history ?HPV Vaccine ?2 doses all  patients age 25-26 ?Dosing may be considered for patients over 26 ?Shingles Vaccine (Shingrix) ?2 doses all adults 50 years and older ?Pneumonia (Pneumovax 23) ?All adults 37 years and older ?May recommend earlier dosing based on health history ?Pneumonia (Prevnar 79) ?All adults 37 years and older ?Dosed 1 year after Pneumovax 23 ?Pneumonia (Prevnar 54) ?All adults 63 years and older (adults 79-72 with certain conditions or risk factors) ?1 dose  ?For those who have no received Prevnar 13 vaccine previously ? ? ?Additional Screening, Testing, and Vaccinations may be recommended on an individualized basis based on  family history, health history, risk factors, and/or exposure.  ?__________________________________________________________ ? ?Diet Recommendations for All Patients ? ?I recommend that all patients maintain a di

## 2021-09-26 NOTE — Progress Notes (Signed)
? ?______________________________________________________________________ ? ?HPI ?Alicia Pena is a 51 y.o. female presenting to [facility] at [facility] today to establish care. Here with husband. She has been following with oncology and has not has not had a PCP in about a year.  ? ?Patient Care Team: ?Terrilyn Saver, NP as PCP - General (Family Medicine) ? ?Health Maintenance  ?Topic Date Due  ? COVID-19 Vaccine (1) Never done  ? HIV Screening  Never done  ? Hepatitis C Screening: USPSTF Recommendation to screen - Ages 51-79 yo.  Never done  ? Zoster (Shingles) Vaccine (1 of 2) Never done  ? Colon Cancer Screening  Never done  ? Mammogram  03/27/2021  ? Flu Shot  09/30/2021*  ? Tetanus Vaccine  02/09/2030  ? HPV Vaccine  Aged Out  ? Pap Smear  Discontinued  ?*Topic was postponed. The date shown is not the original due date.  ? ? ? ?Concerns today: ?In her history, patient reports that she had breast cancer with a left-sided mastectomy around 2013.  In November 2021 she fell and broke her right hip.  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  She ended up doing physical therapy for a while and was eventually discharged to do home exercises.  States she still follows with oncology every 4 weeks.  She is currently on anastrozole, Ibrance, Xarelto, as needed Zofran.  Reports she was getting regular mammograms but has not had one since 2020.  Unsure if still needed given that she gets occasional PET scans.  She will check with oncology but asked me to go and place a mammogram order just in case. ?Additionally she is concerned that her blood pressure seems to be kind of all over the place.  However states she does not monitor at home.  Typically when she goes to oncology they draw labs first and her blood pressure tends to be really high because she is anxious.  Last time she had blood work drawn on a separate day then the doctor's appointment and blood pressure was  much better at that time.  She denies any chest pain, headaches, vision changes, dyspnea, edema.  She wants to know how she should be monitoring this at home to make sure nothing goes unnoticed. ?Blood pressure has been sporadic at appointments (gets very nervous); stressful job (works in [employer], [employer]) - she wants to know how to monitor to keep an eye on it appropriately  ?She is interested in trying aquatic therapy for her ongoing weakness since the hip surgery, requesting referral. ? ? ? ?Patient Active Problem List  ? Diagnosis Date Noted  ? Lymphedema of upper extremity 05/04/2014  ? Hypertension 05/04/2014  ? 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 Cerebral palsy ([place]) 02/03/2013  ? Edema of lower extremity 10/04/2012  ? Breast cancer ([place] 03/21/2012  ? Cancer of upper-outer quadrant of female breast ([place]) 02/16/2012  ? ? ? ?______________________________________________________________________ ?PMH ?Past Medical History:  ?Diagnosis Date  ? Allergy   ? Anemia   ? Anxiety   ? does not like needles ..   ? Arthritis   ? Breast cancer ([place]) 03/21/2012  ? left multifocal invasive, in situ ca,margins not involved,ER/PR=+,metastatic (1/1) node  ? 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 Clotting disorder ([place])   ? Complication of anesthesia   ? Depression   ? History of radiation therapy 10/08/2012-11/21/2012  ? 50.4 gray to left chest wall  and supraclavicular region  ? Personal history of chemotherapy   ? Personal history of radiation therapy   ? ? ?ROS ?All review of systems negative except what is listed in the HPI ? ?PHYSICAL EXAM ?Physical Exam ?Vitals reviewed.  ?Constitutional:   ?   Appearance: Normal appearance. She is obese.  ?Cardiovascular:  ?   Rate and Rhythm: Normal rate and regular rhythm.  ?Pulmonary:  ?   Effort: Pulmonary effort is normal.  ?   Breath sounds: Normal breath sounds.  ?Musculoskeletal:  ?   Comments: wheelchair  ?Skin: ?   General: Skin is warm and dry.   ?Neurological:  ?   General: No focal deficit present.  ?   Mental Status: She is alert and oriented to person, place, and time. Mental status is at baseline.  ?Psychiatric:     ?   Mood and Affect: Mood normal.     ?   Behavior: Behavior normal.     ?   Thought Content: Thought content normal.     ?   Judgment: Judgment normal.  ? ?______________________________________________________________________ ?ASSESSMENT AND PLAN ? ?1. Muscle weakness (generalized) ?She would benefit from physical therapy for generalized weakness in legs and arms (when trying to use walker). Prefers aquatic therapy if possible.  ?- Ambulatory referral to Physical Therapy ? ?2. Encounter for screening mammogram for malignant neoplasm of breast ?Requested mammogram order. May not be necessary given PET monitoring - recommended she confirm with oncology before scheduling.  ?- MM Digital Screening Unilat R; Future ? ? ?Establish care ?Education provided today during visit and on AVS for patient to review at home.  ?Diet and Exercise recommendations provided.  ?Current diagnoses and recommendations discussed. ?HM recommendations reviewed with recommendations.  ? ? ?Outpatient Encounter Medications as of 09/26/2021  ?Medication Sig  ? ondansetron (ZOFRAN) 8 MG tablet Take by mouth.  ? anastrozole (ARIMIDEX) 1 MG tablet Take 1 mg by mouth daily.  ? IBRANCE 75 MG tablet Take by mouth.  ? XARELTO 20 MG TABS tablet Take 20 mg by mouth daily.  ? [DISCONTINUED] acetaminophen (TYLENOL) 325 MG tablet Take 650 mg by mouth every 6 (six) hours as needed.  ? [DISCONTINUED] ALPRAZolam (XANAX) 0.25 MG tablet Take 1 tablet (0.25 mg total) by mouth at bedtime as needed for sleep.  ? [DISCONTINUED] bisoprolol (ZEBETA) 10 MG tablet Take 0.5 tablets (5 mg total) by mouth daily.  ? [DISCONTINUED] ergocalciferol (VITAMIN D2) 50000 UNITS capsule Take 1 capsule (50,000 Units total) by mouth once a week.  ? [DISCONTINUED] fluticasone (FLONASE) 50 MCG/ACT nasal spray  Place 2 sprays into both nostrils daily.  ? [DISCONTINUED] ibuprofen (ADVIL,MOTRIN) 200 MG tablet Take 200 mg by mouth every 6 (six) hours as needed.  ? [DISCONTINUED] lidocaine-prilocaine (EMLA) cream Apply topically as needed.  ? [DISCONTINUED] LORazepam (ATIVAN) 1 MG tablet   ? [DISCONTINUED] Multiple Vitamin (MULTIVITAMIN WITH MINERALS) TABS Take 1 tablet by mouth daily.  ? [DISCONTINUED] Multiple Vitamins-Iron (ONE-TABLET-DAILY/IRON PO) Take 1 tablet by mouth daily.  ? [DISCONTINUED] tamoxifen (NOLVADEX) 20 MG tablet Take 1 tablet (20 mg total) by mouth daily.  ? [DISCONTINUED] venlafaxine XR (EFFEXOR-XR) 75 MG 24 hr capsule Take 1 capsule (75 mg total) by mouth daily with breakfast.  ? ?No facility-administered encounter medications on file as of 09/26/2021.  ? ? ?Return for physical at your convenience . ? ?I spent 30 minutes dedicated to the care of this patient on the date of this encounter to include pre-visit chart review  of prior notes and results, face-to-face time with the patient performing a medically appropriate exam, counseling/education regarding deconditioning and need for routine activity/PT/etc., and post-visit documentation.  ? ? ?[employer] [employer] Olevia Bowens, DNP, FNP-C ? ? ?

## 2021-09-27 ENCOUNTER — Telehealth (HOSPITAL_BASED_OUTPATIENT_CLINIC_OR_DEPARTMENT_OTHER): Payer: Self-pay

## 2021-09-30 LAB — HEMOGLOBIN A1C: Hemoglobin A1C: 9.1

## 2021-09-30 LAB — BASIC METABOLIC PANEL: Glucose: 481

## 2021-10-04 ENCOUNTER — Encounter: Payer: Self-pay | Admitting: Family Medicine

## 2021-10-04 ENCOUNTER — Ambulatory Visit: Payer: Managed Care, Other (non HMO) | Admitting: Family Medicine

## 2021-10-04 VITALS — BP 141/98 | HR 122 | Ht 60.0 in | Wt 196.6 lb

## 2021-10-04 DIAGNOSIS — I10 Essential (primary) hypertension: Secondary | ICD-10-CM | POA: Diagnosis not present

## 2021-10-04 DIAGNOSIS — E119 Type 2 diabetes mellitus without complications: Secondary | ICD-10-CM | POA: Insufficient documentation

## 2021-10-04 DIAGNOSIS — R17 Unspecified jaundice: Secondary | ICD-10-CM | POA: Diagnosis not present

## 2021-10-04 HISTORY — DX: Type 2 diabetes mellitus without complications: E11.9

## 2021-10-04 LAB — GLUCOSE, POCT (MANUAL RESULT ENTRY): POC Glucose: 320 mg/dl — AB (ref 70–99)

## 2021-10-04 MED ORDER — FREESTYLE LIBRE 3 SENSOR MISC: 1.0000 | 5 refills | Status: DC

## 2021-10-04 MED ORDER — LOSARTAN POTASSIUM 25 MG PO TABS: 25.0000 mg | ORAL_TABLET | Freq: Every day | ORAL | 1 refills | Status: DC

## 2021-10-04 NOTE — Progress Notes (Signed)
? ?Acute Office Visit ? ?Subjective:  ? ? Patient ID: Alicia Pena, female    DOB: 1971-03-04, 51 y.o.   MRN: [id number] ? ?CC: urgent care follow-up ? ? ?HPI ?Patient is in today for urgent care follow-up ? ?Last week patient started peeing more frequently, drinking more often (primarily sugary drinks), and having headaches. She went to urgent care and they checked labs: glucose = 481, A1c = 9.1%, UA + glucose. Reports they started her on metformin 500 mg BID. The next day she cut out all sugary drinks and started drinking water with True Lemon added to it (previously was not drinking any water), sparkling water with fruit juice, Vitamin water. Started changing her meal patterns, no longer eating after 6:15pm at night.  ?Feels like the thirsty feeling and excessive urination has been slowly improving. She denies any side effects from the Metformin so far.  ? ? ? ?Past Medical History:  ?Diagnosis Date  ? Allergy   ? Anemia   ? Anxiety   ? does not like needles ..   ? Arthritis   ? Breast cancer ([place]) 03/21/2012  ? left multifocal invasive, in situ ca,margins not involved,ER/PR=+,metastatic (1/1) node  ? Cerebral palsy ([place])   ? from birth  ? Clotting disorder ([place])   ? Complication of anesthesia   ? Depression   ? History of radiation therapy 10/08/2012-11/21/2012  ? 50.4 gray to left chest wall and supraclavicular region  ? Personal history of chemotherapy   ? Personal history of radiation therapy   ? ? ?Past Surgical History:  ?Procedure Laterality Date  ? ABDOMINAL HYSTERECTOMY  2006  ? w/o oophorectomy  ? AXILLARY LYMPH NODE DISSECTION  04/04/2012  ? Procedure: AXILLARY LYMPH NODE DISSECTION;  Surgeon: Rolm Bookbinder, MD;  Location: [facility];  Service: General;  Laterality: Left;  Left Axillary lymph node dissection, Port a cath placement  ? BREAST BIOPSY  89/07/2011  ? left breast uoq bx=invasive ductal ca  ? breast biopsy bi lateral  02/15/12  ? left = invasive ductal, right  breast= fibroadenoma,fibrocystic changes,no atypia,hyperlasia or malignancy identified  ? EYE SURGERY    ? as a child  ? LEG SURGERY    ? LLE  ? MASTECTOMY COMPLETE / SIMPLE W/ SENTINEL NODE BIOPSY Left 03/21/2012  ? left Dr.Matthew Donne Hazel  ? PORTACATH PLACEMENT  04/04/2012  ? Procedure: INSERTION PORT-A-CATH;  Surgeon: Rolm Bookbinder, MD;  Location: [facility];  Service: General;  Laterality: Right;  ? ? ?Family History  ?Problem Relation Age of Onset  ? Learning disabilities Mother   ? Hypertension Mother   ? Depression Mother   ? Cancer Father 104  ?     lung  ? Depression Sister   ? Diabetes Maternal Grandmother   ? Hyperlipidemia Maternal Grandmother   ? Hypertension Maternal Grandmother   ? ? ?Social History  ? ?Socioeconomic History  ? Marital status: Married  ?  Spouse name: Not on file  ? Number of children: Not on file  ? Years of education: Not on file  ? Highest education level: Not on file  ?Occupational History  ? Not on file  ?Tobacco Use  ? Smoking status: Never  ? Smokeless tobacco: Never  ?Vaping Use  ? Vaping Use: Never used  ?Substance and Sexual Activity  ? Alcohol use: Never  ?  Comment: used birth control pills 6 months,G)PO  ? Drug use: Never  ? Sexual activity: Yes  ?Other Topics Concern  ?  Not on file  ?Social History Narrative  ? Not on file  ? ?Social Determinants of Health  ? ?Financial Resource Strain: Not on file  ?Food Insecurity: Not on file  ?Transportation Needs: Not on file  ?Physical Activity: Not on file  ?Stress: Not on file  ?Social Connections: Not on file  ?Intimate Partner Violence: Not on file  ? ? ?Outpatient Medications Prior to Visit  ?Medication Sig Dispense Refill  ? anastrozole (ARIMIDEX) 1 MG tablet Take 1 mg by mouth daily.    ? IBRANCE 75 MG tablet Take by mouth.    ? metFORMIN (GLUCOPHAGE) 500 MG tablet Take 500 mg by mouth 2 (two) times daily.    ? ondansetron (ZOFRAN) 8 MG tablet Take by mouth.    ? XARELTO 20 MG TABS tablet Take 20 mg by  mouth daily.    ? ?No facility-administered medications prior to visit.  ? ? ?Allergies  ?Allergen Reactions  ? Duricef [Cefadroxil] Hives and Nausea Only  ? ? ?Review of Systems ?All review of systems negative except what is listed in the HPI ? ?   ?Objective:  ?  ?Physical Exam ?Vitals reviewed.  ?Constitutional:   ?   Appearance: Normal appearance. She is obese.  ?Cardiovascular:  ?   Rate and Rhythm: Regular rhythm. Tachycardia present.  ?Pulmonary:  ?   Effort: Pulmonary effort is normal.  ?   Breath sounds: Normal breath sounds.  ?Musculoskeletal:  ?   Right lower leg: No edema.  ?   Left lower leg: No edema.  ?Skin: ?   General: Skin is warm and dry.  ?Neurological:  ?   General: No focal deficit present.  ?   Mental Status: She is alert and oriented to person, place, and time. Mental status is at baseline.  ?Psychiatric:     ?   Mood and Affect: Mood normal.     ?   Behavior: Behavior normal.     ?   Thought Content: Thought content normal.     ?   Judgment: Judgment normal.  ? ? ?BP (!) 141/98   Pulse (!) 122   Ht 5' (1.524 m)   Wt 196 lb 9.6 oz (89.2 kg)   BMI 38.40 kg/m?  ?Wt Readings from Last 3 Encounters:  ?10/04/21 196 lb 9.6 oz (89.2 kg)  ?09/26/21 199 lb 12.8 oz (90.6 kg)  ?05/04/14 166 lb 8 oz (75.5 kg)  ? ? ?Health Maintenance Due  ?Topic Date Due  ? COVID-19 Vaccine (1) Never done  ? HIV Screening  Never done  ? Hepatitis C Screening  Never done  ? Zoster Vaccines- Shingrix (1 of 2) Never done  ? COLONOSCOPY (Pts 45-37yr Insurance coverage will need to be confirmed)  Never done  ? MAMMOGRAM  03/27/2021  ? ? ?There are no preventive care reminders to display for this patient. ? ? ?No results found for: TSH ?Lab Results  ?Component Value Date  ? WBC 6.9 02/19/2014  ? HGB 12.6 02/19/2014  ? HCT 38.4 02/19/2014  ? MCV 79.0 02/19/2014  ? PLT 184 02/19/2014  ? ?Lab Results  ?Component Value Date  ? NA 142 02/19/2014  ? K 3.1 (L) 02/19/2014  ? CHLORIDE 111 (H) 11/12/2013  ? CO2 23 02/19/2014  ?  GLUCOSE 124 (H) 02/19/2014  ? BUN 10 02/19/2014  ? CREATININE 0.60 02/19/2014  ? BILITOT 0.4 02/19/2014  ? ALKPHOS 99 02/19/2014  ? AST 35 02/19/2014  ? ALT 32 02/19/2014  ?  PROT 7.8 02/19/2014  ? ALBUMIN 3.5 02/19/2014  ? CALCIUM 9.4 02/19/2014  ? ANIONGAP 16 (H) 02/19/2014  ? ?No results found for: CHOL ?No results found for: HDL ?No results found for: [place] ?No results found for: TRIG ?No results found for: CHOLHDL ?Lab Results  ?Component Value Date  ? HGBA1C 9.1 09/30/2021  ? ? ?   ?Assessment & Plan:  ? ?POCT CBG = 320 ? ? ?Type 2 diabetes mellitus without complication, without long-term current use of insulin ([place]) ?Poorly controlled with last A1c 9.1% last week at [facility] ?Continue current Metformin 500 mg twice a day ?Keep fasting glucose log (pres[facility]) ?Discussed diet and exercise - education provided  ?Thorough discussion provided regarding diagnosis, management. Aware that with an A1c this high we typically treat with multiple agents. She is determined to make lifestyle changes and would like to start with this before changing anything else. Agreeable to follow-up in 1 month with fasting glucose log to ensure making good progress. If not improving, will need to at least increase metformin and then consider secondary agent.  ? ? ?Hypertension ?Blood pressure is not at goal for age and co-morbidities.  I recommend starting losartan 25 mg daily.   ?- BP goal <130/80 ?- monitor and log blood pressures at home ?- check around the same time each day in a relaxed setting ?- Limit salt to <2000 mg/day ?- Follow DASH eating plan (heart healthy diet) ?- limit alcohol to 2 standard drinks per day for men and 1 per day for women ?- avoid tobacco products ?- get at least 2 hours of regular aerobic exercise weekly ?Patient aware of signs/symptoms requiring further/urgent evaluation. ?Labs updated today. ?Nurse visit in 2 weeks for BP check and repeat CMP ? ? ?Orders Placed This Encounter   ?Procedures  ? Comprehensive metabolic panel  ? Basic metabolic panel  ?  This external order was created through the Results Console.  ? Hemoglobin A1c  ?  This external order was created through the Results Console.

## 2021-10-04 NOTE — Assessment & Plan Note (Signed)
Poorly controlled with last A1c 9.1% last week at [facility] ?Continue current Metformin 500 mg twice a day ?Keep fasting glucose log (prescription sent in for [facility]) ?Discussed diet and exercise - education provided  ?Thorough discussion provided regarding diagnosis, management. Aware that with an A1c this high we typically treat with multiple agents. She is determined to make lifestyle changes and would like to start with this before changing anything else. Agreeable to follow-up in 1 month with fasting glucose log to ensure making good progress. If not improving, will need to at least increase metformin and then consider secondary agent.  ? ?

## 2021-10-04 NOTE — Assessment & Plan Note (Signed)
Blood pressure is not at goal for age and co-morbidities.  I recommend starting losartan 25 mg daily.   ?- BP goal <130/80 ?- monitor and log blood pressures at home ?- check around the same time each day in a relaxed setting ?- Limit salt to <2000 mg/day ?- Follow DASH eating plan (heart healthy diet) ?- limit alcohol to 2 standard drinks per day for men and 1 per day for women ?- avoid tobacco products ?- get at least 2 hours of regular aerobic exercise weekly ?Patient aware of signs/symptoms requiring further/urgent evaluation. ?Labs updated today. ?Nurse visit in 2 weeks for BP check and repeat CMP ?

## 2021-10-04 NOTE — Patient Instructions (Signed)
Diabetes: ?Poorly controlled with last A1c 9.1% ?Continue current Metformin 500 mg twice a day ?Keep fasting glucose log (prescription sent in for [facility]) ?Discussed diet and exercise - education provided  ?F/u in 1 month ? ?Hypertension:  ?Blood pressure is not at goal for age and co-morbidities.  I recommend starting losartan 25 mg daily.   ?- BP goal <130/80 ?- monitor and log blood pressures at home ?- check around the same time each day in a relaxed setting ?- Limit salt to <2000 mg/day ?- Follow DASH eating plan (heart healthy diet) ?- limit alcohol to 2 standard drinks per day for men and 1 per day for women ?- avoid tobacco products ?- get at least 2 hours of regular aerobic exercise weekly ?Patient aware of signs/symptoms requiring further/urgent evaluation. ?Labs updated today. ?Nurse visit in 2 weeks for BP check and repeat CMP ? ?

## 2021-10-05 LAB — COMPREHENSIVE METABOLIC PANEL
ALT: 90 U/L — ABNORMAL HIGH (ref 0–35)
AST: 136 U/L — ABNORMAL HIGH (ref 0–37)
Albumin: 4.3 g/dL (ref 3.5–5.2)
Alkaline Phosphatase: 83 U/L (ref 39–117)
BUN: 14 mg/dL (ref 6–23)
CO2: 23 mEq/L (ref 19–32)
Calcium: 9.5 mg/dL (ref 8.4–10.5)
Chloride: 102 mEq/L (ref 96–112)
Creatinine, Ser: 0.5 mg/dL (ref 0.40–1.20)
GFR: 107.95 mL/min (ref >60.00)
Glucose, Bld: 344 mg/dL — ABNORMAL HIGH (ref 70–99)
Potassium: 3.9 mEq/L (ref 3.5–5.1)
Sodium: 137 mEq/L (ref 135–145)
Total Bilirubin: 1.4 mg/dL — ABNORMAL HIGH (ref 0.2–1.2)
Total Protein: 7.1 g/dL (ref 6.0–8.3)

## 2021-10-06 NOTE — Addendum Note (Signed)
Addended by: Caleen Jobs B on: 10/06/2021 08:23 AM ? ? Modules accepted: Orders ? ?

## 2021-10-11 ENCOUNTER — Telehealth: Payer: Self-pay | Admitting: Family Medicine

## 2021-10-11 NOTE — Telephone Encounter (Signed)
Pt would like to go over results in greater detail. Pt would a call back. Please advise. ?

## 2021-10-11 NOTE — Telephone Encounter (Signed)
See result note.  

## 2021-10-12 ENCOUNTER — Telehealth: Payer: Self-pay | Admitting: Family Medicine

## 2021-10-12 NOTE — Telephone Encounter (Signed)
Pt still has a lot of questions regarding her incontinence. If she coughs too hard she pees herself, and it is very frustrating as she is trying to work during the day. When she goes to bed she does not wet her self, but does have to get up and use the bathroom a lot during the night. She stated she has been drinking a lot more water because she has been extremely thirsty. She feels she is losing sleep because she is going to the bathroom so often. She would like a call to speak with a nurse about what cane be done. Please advise.  ?

## 2021-10-12 NOTE — Telephone Encounter (Signed)
Called pt, left VM for callback. Sent pt message on Mychart.  ?

## 2021-10-18 ENCOUNTER — Other Ambulatory Visit (INDEPENDENT_AMBULATORY_CARE_PROVIDER_SITE_OTHER): Payer: Managed Care, Other (non HMO)

## 2021-10-18 ENCOUNTER — Ambulatory Visit (INDEPENDENT_AMBULATORY_CARE_PROVIDER_SITE_OTHER): Payer: Managed Care, Other (non HMO) | Admitting: Family Medicine

## 2021-10-18 DIAGNOSIS — R748 Abnormal levels of other serum enzymes: Secondary | ICD-10-CM

## 2021-10-18 DIAGNOSIS — R17 Unspecified jaundice: Secondary | ICD-10-CM

## 2021-10-18 DIAGNOSIS — I10 Essential (primary) hypertension: Secondary | ICD-10-CM

## 2021-10-18 NOTE — Addendum Note (Signed)
Addended by: Manuela Schwartz on: 10/18/2021 03:08 PM ? ? Modules accepted: Orders ? ?

## 2021-10-18 NOTE — Progress Notes (Signed)
Pt here for Blood pressure check per  Caleen Jobs  order,   ?- BP goal <130/80 ?- monitor and log blood pressures at home ? ?Pt currently takes:losartan 25 mg daily. ? ? ?Pt reports compliance with medication. ? ?BP today @ =128/70 ?HR =107 ? ?Pt advised per No Changes see you back in May  ?

## 2021-10-18 NOTE — Addendum Note (Signed)
Addended by: Manuela Schwartz on: 10/18/2021 03:06 PM ? ? Modules accepted: Orders ? ?

## 2021-10-19 LAB — COMPREHENSIVE METABOLIC PANEL
AG Ratio: 1.5 (calc) (ref 1.0–2.5)
ALT: 83 U/L — ABNORMAL HIGH (ref 6–29)
AST: 54 U/L — ABNORMAL HIGH (ref 10–35)
Albumin: 4.5 g/dL (ref 3.6–5.1)
Alkaline phosphatase (APISO): 90 U/L (ref 37–153)
BUN: 21 mg/dL (ref 7–25)
CO2: 24 mmol/L (ref 20–32)
Calcium: 10.5 mg/dL — ABNORMAL HIGH (ref 8.6–10.4)
Chloride: 97 mmol/L — ABNORMAL LOW (ref 98–110)
Creat: 0.8 mg/dL (ref 0.50–1.03)
Globulin: 3 g/dL (calc) (ref 1.9–3.7)
Glucose, Bld: 433 mg/dL — ABNORMAL HIGH (ref 65–99)
Potassium: 4.4 mmol/L (ref 3.5–5.3)
Sodium: 138 mmol/L (ref 135–146)
Total Bilirubin: 1.2 mg/dL (ref 0.2–1.2)
Total Protein: 7.5 g/dL (ref 6.1–8.1)

## 2021-10-19 LAB — BILIRUBIN, FRACTIONATED(TOT/DIR/INDIR)
Bilirubin, Direct: 0.3 mg/dL — ABNORMAL HIGH (ref 0.0–0.2)
Indirect Bilirubin: 0.9 mg/dL (calc) (ref 0.2–1.2)
Total Bilirubin: 1.2 mg/dL (ref 0.2–1.2)

## 2021-10-19 NOTE — Addendum Note (Signed)
Addended by: Caleen Jobs B on: 10/19/2021 08:01 AM ? ? Modules accepted: Orders ? ?

## 2021-11-01 ENCOUNTER — Encounter: Payer: Self-pay | Admitting: Family Medicine

## 2021-11-01 ENCOUNTER — Ambulatory Visit (INDEPENDENT_AMBULATORY_CARE_PROVIDER_SITE_OTHER): Payer: Managed Care, Other (non HMO) | Admitting: Family Medicine

## 2021-11-01 VITALS — BP 124/91 | HR 124 | Ht 60.0 in | Wt 186.2 lb

## 2021-11-01 DIAGNOSIS — E119 Type 2 diabetes mellitus without complications: Secondary | ICD-10-CM | POA: Diagnosis not present

## 2021-11-01 MED ORDER — LEVEMIR FLEXTOUCH 100 UNIT/ML ~~LOC~~ SOPN: 10.0000 [IU] | PEN_INJECTOR | Freq: Every day | SUBCUTANEOUS | 11 refills | Status: DC

## 2021-11-01 MED ORDER — METFORMIN HCL 1000 MG PO TABS: 1000.0000 mg | ORAL_TABLET | Freq: Two times a day (BID) | ORAL | 3 refills | Status: DC

## 2021-11-01 MED ORDER — PEN NEEDLES 32G X 4 MM MISC: 1.0000 | Freq: Every day | 4 refills | Status: AC

## 2021-11-01 MED ORDER — EMPAGLIFLOZIN 10 MG PO TABS: 10.0000 mg | ORAL_TABLET | Freq: Every day | ORAL | 3 refills | Status: DC

## 2021-11-01 NOTE — Progress Notes (Signed)
? ?Established Patient Office Visit ? ?Subjective   ?Patient ID: Alicia Pena, female    DOB: Mar 19, 1971  Age: 51 y.o. MRN: [id number] ? ?CC: DM f/u  ? ? ?HPI ? ?DIABETES: ?Patient was last seen on 10/04/21 for urgent care follow-up of glucose level of 481 and A1c at 9.1%. She was started on metformin 500 mg BID. At our visit, she was given a prescription for [facility] and thorough discussion/education regarding diabetes diagnosis and management. At that time she was hesitant to add any other medications, but agreed to monitoring fasting glucose and coming back sooner with log to discuss if we were making any progress on metformin alone.  ?- Checking BG at home: occasionally, best she has seen is 376 (did not bring a log, but is guessing average fasting glucose is around 400). ?- Medications: metformin 500 mg BID ?- Compliance: she is taking 1,000 mg in the evening instead ?- Diet: tries to reduce sugar (has one popsicle every day), husband is controlling her carbs/portions ?- Exercise: none ?- Reports increased thirst and urination  ?- Denies symptoms of hypoglycemia, numbness extremities, foot ulcers/trauma, wounds that are not healing, medication side effects  ? ? ? ? ? ?ROS ?All review of systems negative except what is listed in the HPI ? ?  ?Objective:  ?  ? ?BP (!) 124/91   Pulse (!) 124   Ht 5' (1.524 m)   Wt 186 lb 3.2 oz (84.5 kg)   BMI 36.36 kg/m?  ? ? ?Physical Exam ?Vitals reviewed.  ?Constitutional:   ?   General: She is not in acute distress. ?   Appearance: Normal appearance. She is obese.  ?Pulmonary:  ?   Effort: Pulmonary effort is normal.  ?Skin: ?   General: Skin is warm and dry.  ?Neurological:  ?   General: No focal deficit present.  ?   Mental Status: She is alert and oriented to person, place, and time. Mental status is at baseline.  ?Psychiatric:     ?   Mood and Affect: Mood normal.     ?   Behavior: Behavior normal.     ?   Thought Content: Thought content normal.     ?    Judgment: Judgment normal.  ? ? ? ?No results found for any visits on 11/01/21. ? ? ? ?The ASCVD Risk score (Arnett DK, et al., 2019) failed to calculate for the following reasons: ?  Cannot find a previous HDL lab ?  Cannot find a previous total cholesterol lab ? ?  ?Assessment & Plan:  ? ?Problem List Items Addressed This Visit   ? ?  ? Endocrine  ? Type 2 diabetes mellitus without complication, without long-term current use of insulin ([place]) - Primary  ?  Poorly controlled (too soon to check A1c, but reports average fasting glucose > 400) - thorough discussion of management and diagnosis  ?Increasing metformin to 1,000 mg twice a day ?Add Jardiance in the morning - education provided (she is not wanting to add any more "needles" at this time) ?She is hesitant/tearful, but agreeable to starting insulin - Since your glucose levels are so high and you are symptomatic, let's start some basal (long-acting) insulin. Titration instructions on the prescription - Start at 10 units every night. Titrate up by 2 units every 3 days if fasting glucose is higher that 130 consistently.  ?Lab is closed this afternoon - we will recheck your cholesterol and urine at next visit in  2 weeks ?Schedule a nurse visit for insulin teaching ?Follow-up in 2 weeks with log of fasting glucose levels once starting insulin ?Review attached handouts and go to the ED for any "red flag" symptoms as we discussed - educated that her glucose is high enough and she is symptomatic enough to be treated in the hospital, but pt prefers focusing on outpatient management for now - she is aware of complications of uncontrolled diabetes. Strict ED precautions discussed.  ?Adding referrals to endocrinology and diabetic education  ?  ?  ? Relevant Medications  ? insulin detemir (LEVEMIR FLEXTOUCH) 100 UNIT/ML FlexPen  ? Insulin Pen Needle (PEN NEEDLES) 32G X 4 MM MISC  ? empagliflozin (JARDIANCE) 10 MG TABS tablet  ? metFORMIN (GLUCOPHAGE) 1000 MG tablet  ?  Other Relevant Orders  ? Amb Referral to Nutrition and Diabetic Education  ? Ambulatory referral to Endocrinology  ? ? ?Return in about 3 weeks (around 11/22/2021) for DM follow-up 2 wks after new meds; schedule a nurse visit for insulin teaching when you pick it up .  ? ? ?I spent >30 minutes dedicated to the care of this patient on the date of this encounter to include pre-visit chart review of prior notes and results, face-to-face time with the patient performing a medically appropriate exam, counseling/education regarding uncontrolled diabetes, and post-visit documentation and ordering of labs, meds, referrals as indicated.  ? ? ?Terrilyn Saver, NP ? ?

## 2021-11-01 NOTE — Assessment & Plan Note (Signed)
Poorly controlled (too soon to check A1c, but reports average fasting glucose > 400) - thorough discussion of management and diagnosis  ?Increasing metformin to 1,000 mg twice a day ?Add Jardiance in the morning - education provided (she is not wanting to add any more "needles" at this time) ?She is hesitant/tearful, but agreeable to starting insulin - Since your glucose levels are so high and you are symptomatic, let's start some basal (long-acting) insulin. Titration instructions on the prescription - Start at 10 units every night. Titrate up by 2 units every 3 days if fasting glucose is higher that 130 consistently.  ?Lab is closed this afternoon - we will recheck your cholesterol and urine at next visit in 2 weeks ?Schedule a nurse visit for insulin teaching ?Follow-up in 2 weeks with log of fasting glucose levels once starting insulin ?Review attached handouts and go to the ED for any "red flag" symptoms as we discussed - educated that her glucose is high enough and she is symptomatic enough to be treated in the hospital, but pt prefers focusing on outpatient management for now - she is aware of complications of uncontrolled diabetes. Strict ED precautions discussed.  ?Adding referrals to endocrinology and diabetic education  ?

## 2021-11-01 NOTE — Patient Instructions (Addendum)
Poorly controlled - thorough discussion of management and diagnosis  ?Increasing metformin to 1,000 mg twice a day ?Add Jardiance in the morning - education provided  ?Since your glucose levels are so high and you are symptomatic, let's start some basal (long-acting) insulin. Titration instructions on the prescription - Start at 10 units every night. Titrate up by 2 units every 3 days if fasting glucose is higher that 130 consistently.  ?Lab is closed this afternoon - we will recheck your cholesterol and urine at next visit in 2 weeks ?Schedule a nurse visit for insulin teaching ?Follow-up in 2 weeks with log of fasting glucose levels once starting insulin ?Review attached handouts and go to the ED for any "red flag" symptoms as we discussed  ?Adding referrals to endocrinology and diabetic education  ? ? ? ? ? ?

## 2021-11-07 ENCOUNTER — Telehealth: Payer: Self-pay

## 2021-11-07 NOTE — Telephone Encounter (Signed)
Nurse Assessment ?Nurse: Waymond Cera, RN, Benjamine Mola Date/Time (Eastern Time): 11/07/2021 8:09:17 AM ?Confirm and document reason for call. If ?symptomatic, describe symptoms. ?---Caller states that she just started on a new diabetes ?medication. States that she is vomiting now. ?Does the patient have any new or worsening ?symptoms? ---Yes ?Will a triage be completed? ---Yes ?Related visit to physician within the last 2 weeks? ---Yes ?Does the PT have any chronic conditions? (i.e. ?diabetes, asthma, this includes High risk factors for ?pregnancy, etc.) ?---Yes ?List chronic conditions. ---diabetes cancer ?Is the patient pregnant or possibly pregnant? (Ask ?all females between the ages of 48-55) ---No ?Is this a behavioral health or substance abuse call? ---No ?Guidelines ?Guideline Title Affirmed Question Affirmed Notes Nurse Date/Time (Eastern ?Time) ?Vomiting Difficult to awaken or ?acting confused (e.g., ?disoriented, slurred ?speech) ?Cantrell, RN, ?Elizabeth ?11/07/2021 8:10:50 AM ?Disp. Time (Eastern ?Time) Disposition Final User ?11/07/2021 7:58:59 AM Attempt made - message left Cantrell, RN, Benjamine Mola ?PLEASE NOTE: All timestamps contained within this report are represented as [place] Standard Time. ?CONFIDENTIALTY NOTICE: This fax transmission is intended only for the addressee. It contains information that is legally privileged, confidential or ?otherwise protected from use or disclosure. If you are not the intended recipient, you are strictly prohibited from reviewing, disclosing, copying using ?or disseminating any of this information or taking any action in reliance on or regarding this information. If you have received this fax in error, please ?notify us immediately by telephone so that we can arrange for its return to [place]. Phone: [phone], Toll-Free: [phone], Fax: [phone] ?Page: 2 of 2 ?Call Id: [id number] ?Disp. Time (Eastern ?Time) Disposition Final User ?11/07/2021 8:16:02 AM 911 Outcome  Documentation Cantrell, RN, Benjamine Mola ?Reason: Not calling 911. See note. ?11/07/2021 8:14:35 AM Call EMS 911 Now Yes Cantrell, RN, Benjamine Mola ?Caller Disagree/Comply Disagree ?Caller Understands Yes ?PreDisposition InappropriateToAsk ?Care Advice Given Per Guideline ?CALL EMS 911 NOW: * Immediate medical attention is needed. You need to hang up and call 911 (or an ambulance). CARE ?ADVICE per Vomiting (Adult) guideline. BRING MEDICINES: * Bring a list of your current medicines when you go to the ?Emergency Department (ER). * Bring the pill bottles too. This will help the doctor (or NP/PA) to make certain you are taking the ?right medicines and the right dose. ?Comments ?User: Sharol Given, RN Date/Time Eilene Ghazi Time): 11/07/2021 8:12:01 AM ?Caller also states that she has CP. ?User: Sharol Given, RN Date/Time Eilene Ghazi Time): 11/07/2021 8:13:34 AM ?Caller states that she is having slurred speech. ?User: Sharol Given, RN Date/Time Eilene Ghazi Time): 11/07/2021 8:15:42 AM ?Caller states that she will go by car instead of calling 911. Advised risk of going by car includes getting worse and ?possible death. Verbalizes understanding. ?Referrals ?GO TO FACILITY OTHER - SPECIFY ?

## 2021-11-08 ENCOUNTER — Telehealth: Payer: Self-pay | Admitting: Family Medicine

## 2021-11-08 NOTE — Telephone Encounter (Signed)
Patient wanted to let Lovena Le know that FMLA paperwork was faxed over to [place] from her insurance company and it needs to be faxed back to them as soon as possible. Patient stated she wanted it faxed back today/tomorrow but she was informed that paperwork usually took a few days to be completed, pending any information that PCP might need from patient. Patient started crying stating she could not take more time off work and needed it sent right away. Informed patient that a note would be made and provider was going to be aware. Please advise.  ?

## 2021-11-08 NOTE — Telephone Encounter (Signed)
I placed her forms in the green folder for you. ?

## 2021-11-09 ENCOUNTER — Telehealth (INDEPENDENT_AMBULATORY_CARE_PROVIDER_SITE_OTHER): Payer: Managed Care, Other (non HMO) | Admitting: Family Medicine

## 2021-11-09 ENCOUNTER — Encounter: Payer: Self-pay | Admitting: Family Medicine

## 2021-11-09 DIAGNOSIS — E1165 Type 2 diabetes mellitus with hyperglycemia: Secondary | ICD-10-CM

## 2021-11-09 NOTE — Progress Notes (Signed)
Virtual Video Visit via MyChart Note ? ?I connected with  Alicia Pena on 11/09/21 at  4:00 PM EDT by the video enabled telemedicine application for MyChart, and verified that I am speaking with the correct person using two identifiers. ?  ?I introduced myself as a Designer, jewellery with the practice. We discussed the limitations of evaluation and management by telemedicine and the availability of in person appointments. The patient expressed understanding and agreed to proceed. ? ?Participating parties in this visit include: The patient and the nurse practitioner listed.  ?The patient is: At home ?I am: In the office - [facility] ? ?Subjective:   ? ?CC: FMLA paperwork ? ? ?HPI: Alicia Pena is a 51 y.o. year old female presenting today via [place] today for FMLA paperwork. ? ? ?Patient is requesting FMLA paperwork be filled out to cover for diabetic sick days and frequent appointments. She is requesting intermittent sick leave. She does not want to discuss any symptoms/concerns today. She was educated on any signs/symptoms that would require further/urgent evaluation.  ? ? ? ? ?Past medical history, Surgical history, Family history not pertinant except as noted below, Social history, Allergies, and medications have been entered into the medical record, reviewed, and corrections made.  ? ?Review of Systems:  ?All review of systems negative except what is listed in the HPI ? ? ?Objective:   ? ?General:  ?Speaking clearly in complete sentences. ?Absent shortness of breath noted.   ?Alert and oriented x3.   ?Normal judgment.  ?Absent acute distress. ? ? ?Impression and Recommendations:   ? ?1. Type 2 diabetes mellitus with hyperglycemia, without long-term current use of insulin ([place]) ? ?No medical concerns discussed today. FMLA paperwork received and reviewed with patient. Will complete and have faxed.  ? ? ?Follow-up if symptoms worsen or fail to improve.  ?  ?I  discussed the assessment and treatment plan with the patient. The patient was provided an opportunity to ask questions and all were answered. The patient agreed with the plan and demonstrated an understanding of the instructions. ?  ?The patient was advised to call back or seek an in-person evaluation if the symptoms worsen or if the condition fails to improve as anticipated. ? ? ? ?Terrilyn Saver, NP  ? ?

## 2021-11-09 NOTE — Telephone Encounter (Signed)
Appt made for 05/10. ?

## 2021-11-11 ENCOUNTER — Other Ambulatory Visit: Payer: Self-pay | Admitting: Family Medicine

## 2021-11-11 DIAGNOSIS — E119 Type 2 diabetes mellitus without complications: Secondary | ICD-10-CM

## 2021-11-11 MED ORDER — LEVEMIR FLEXTOUCH 100 UNIT/ML ~~LOC~~ SOPN: 10.0000 [IU] | PEN_INJECTOR | Freq: Every day | SUBCUTANEOUS | 11 refills | Status: DC

## 2021-11-21 ENCOUNTER — Ambulatory Visit (INDEPENDENT_AMBULATORY_CARE_PROVIDER_SITE_OTHER): Payer: Managed Care, Other (non HMO) | Admitting: Family Medicine

## 2021-11-21 ENCOUNTER — Encounter: Payer: Self-pay | Admitting: Family Medicine

## 2021-11-21 ENCOUNTER — Other Ambulatory Visit: Payer: Self-pay

## 2021-11-21 ENCOUNTER — Emergency Department (HOSPITAL_BASED_OUTPATIENT_CLINIC_OR_DEPARTMENT_OTHER)
Admission: EM | Admit: 2021-11-21 | Discharge: 2021-11-21 | Disposition: A | Discharge status: Home / Self Care | Payer: Managed Care, Other (non HMO) | Attending: Emergency Medicine | Admitting: Emergency Medicine

## 2021-11-21 VITALS — BP 130/92 | HR 82 | Resp 20 | Ht 60.0 in | Wt 186.0 lb

## 2021-11-21 DIAGNOSIS — I10 Essential (primary) hypertension: Secondary | ICD-10-CM | POA: Diagnosis not present

## 2021-11-21 DIAGNOSIS — N3001 Acute cystitis with hematuria: Secondary | ICD-10-CM

## 2021-11-21 DIAGNOSIS — R8289 Other abnormal findings on cytological and histological examination of urine: Secondary | ICD-10-CM | POA: Insufficient documentation

## 2021-11-21 DIAGNOSIS — R7401 Elevation of levels of liver transaminase levels: Secondary | ICD-10-CM | POA: Diagnosis not present

## 2021-11-21 DIAGNOSIS — Z7901 Long term (current) use of anticoagulants: Secondary | ICD-10-CM | POA: Insufficient documentation

## 2021-11-21 DIAGNOSIS — E1165 Type 2 diabetes mellitus with hyperglycemia: Secondary | ICD-10-CM

## 2021-11-21 DIAGNOSIS — Z794 Long term (current) use of insulin: Secondary | ICD-10-CM | POA: Insufficient documentation

## 2021-11-21 DIAGNOSIS — Z7984 Long term (current) use of oral hypoglycemic drugs: Secondary | ICD-10-CM | POA: Insufficient documentation

## 2021-11-21 DIAGNOSIS — R739 Hyperglycemia, unspecified: Secondary | ICD-10-CM

## 2021-11-21 LAB — URINALYSIS, MICROSCOPIC (REFLEX)

## 2021-11-21 LAB — POC URINALSYSI DIPSTICK (AUTOMATED)
Bilirubin, UA: NEGATIVE
Glucose, UA: NEGATIVE
Leukocytes, UA: NEGATIVE
Nitrite, UA: NEGATIVE
Protein, UA: NEGATIVE
Spec Grav, UA: 1.01 (ref 1.010–1.025)
Urobilinogen, UA: 0.2 E.U./dL
pH, UA: 5 (ref 5.0–8.0)

## 2021-11-21 LAB — CBC WITH DIFFERENTIAL/PLATELET
Abs Immature Granulocytes: 0.03 10*3/uL (ref 0.00–0.07)
Basophils Absolute: 0.1 10*3/uL (ref 0.0–0.1)
Basophils Relative: 1 %
Eosinophils Absolute: 0.1 10*3/uL (ref 0.0–0.5)
Eosinophils Relative: 1 %
HCT: 40.5 % (ref 36.0–46.0)
Hemoglobin: 13.5 g/dL (ref 12.0–15.0)
Immature Granulocytes: 1 %
Lymphocytes Relative: 17 %
Lymphs Abs: 0.9 10*3/uL (ref 0.7–4.0)
MCH: 29.9 pg (ref 26.0–34.0)
MCHC: 33.3 g/dL (ref 30.0–36.0)
MCV: 89.6 fL (ref 80.0–100.0)
Monocytes Absolute: 0.4 10*3/uL (ref 0.1–1.0)
Monocytes Relative: 7 %
Neutro Abs: 4.1 10*3/uL (ref 1.7–7.7)
Neutrophils Relative %: 73 %
Platelets: 240 10*3/uL (ref 150–400)
RBC: 4.52 MIL/uL (ref 3.87–5.11)
RDW: 17.5 % — ABNORMAL HIGH (ref 11.5–15.5)
WBC: 5.5 10*3/uL (ref 4.0–10.5)
nRBC: 0 % (ref 0.0–0.2)

## 2021-11-21 LAB — COMPREHENSIVE METABOLIC PANEL
ALT: 72 U/L — ABNORMAL HIGH (ref 0–44)
AST: 46 U/L — ABNORMAL HIGH (ref 15–41)
Albumin: 3.8 g/dL (ref 3.5–5.0)
Alkaline Phosphatase: 110 U/L (ref 38–126)
Anion gap: 16 — ABNORMAL HIGH (ref 5–15)
BUN: 10 mg/dL (ref 6–20)
CO2: 24 mmol/L (ref 22–32)
Calcium: 9.5 mg/dL (ref 8.9–10.3)
Chloride: 94 mmol/L — ABNORMAL LOW (ref 98–111)
Creatinine, Ser: 0.73 mg/dL (ref 0.44–1.00)
GFR, Estimated: >60 mL/min (ref >60)
Glucose, Bld: 554 mg/dL (ref 70–99)
Potassium: 3.6 mmol/L (ref 3.5–5.1)
Sodium: 134 mmol/L — ABNORMAL LOW (ref 135–145)
Total Bilirubin: 2.5 mg/dL — ABNORMAL HIGH (ref 0.3–1.2)
Total Protein: 8 g/dL (ref 6.5–8.1)

## 2021-11-21 LAB — URINALYSIS, ROUTINE W REFLEX MICROSCOPIC
Bilirubin Urine: NEGATIVE
Glucose, UA: >=500 mg/dL — AB
Ketones, ur: 40 mg/dL — AB
Nitrite: POSITIVE — AB
Protein, ur: 30 mg/dL — AB
Specific Gravity, Urine: 1.01 (ref 1.005–1.030)
pH: 5.5 (ref 5.0–8.0)

## 2021-11-21 LAB — I-STAT VENOUS BLOOD GAS, ED
Acid-Base Excess: 2 mmol/L (ref 0.0–2.0)
Bicarbonate: 26.3 mmol/L (ref 20.0–28.0)
Calcium, Ion: 1.15 mmol/L (ref 1.15–1.40)
HCT: 43 % (ref 36.0–46.0)
Hemoglobin: 14.6 g/dL (ref 12.0–15.0)
O2 Saturation: 54 %
Potassium: 3.4 mmol/L — ABNORMAL LOW (ref 3.5–5.1)
Sodium: 134 mmol/L — ABNORMAL LOW (ref 135–145)
TCO2: 28 mmol/L (ref 22–32)
pCO2, Ven: 40.4 mmHg — ABNORMAL LOW (ref 44–60)
pH, Ven: 7.422 (ref 7.25–7.43)
pO2, Ven: 28 mmHg — CL (ref 32–45)

## 2021-11-21 LAB — CBG MONITORING, ED
Glucose-Capillary: 552 mg/dL (ref 70–99)
Glucose-Capillary: 495 mg/dL — ABNORMAL HIGH (ref 70–99)
Glucose-Capillary: 443 mg/dL — ABNORMAL HIGH (ref 70–99)
Glucose-Capillary: 322 mg/dL — ABNORMAL HIGH (ref 70–99)

## 2021-11-21 MED ORDER — INSULIN REGULAR(HUMAN) IN NACL 100-0.9 UT/100ML-% IV SOLN
INTRAVENOUS | Status: DC
  Administered 2021-11-21: 18 [IU]/h via INTRAVENOUS
  Filled 2021-11-21: qty 100

## 2021-11-21 MED ORDER — POTASSIUM CHLORIDE CRYS ER 20 MEQ PO TBCR
40.0000 meq | EXTENDED_RELEASE_TABLET | Freq: Once | ORAL | Status: AC
Start: 2021-11-21 — End: 2021-11-21
  Administered 2021-11-21: 40 meq via ORAL
  Filled 2021-11-21: qty 2

## 2021-11-21 MED ORDER — NITROFURANTOIN MONOHYD MACRO 100 MG PO CAPS
100.0000 mg | ORAL_CAPSULE | Freq: Once | ORAL | Status: AC
  Administered 2021-11-21: 100 mg via ORAL
  Filled 2021-11-21: qty 1

## 2021-11-21 MED ORDER — NITROFURANTOIN MONOHYD MACRO 100 MG PO CAPS: 100.0000 mg | ORAL_CAPSULE | Freq: Two times a day (BID) | ORAL | 0 refills | Status: DC

## 2021-11-21 MED ORDER — DEXTROSE 50 % IV SOLN: 0.0000 mL | INTRAVENOUS | Status: DC | PRN

## 2021-11-21 MED ORDER — LACTATED RINGERS IV BOLUS
1000.0000 mL | Freq: Once | INTRAVENOUS | Status: AC
  Administered 2021-11-21: 1000 mL via INTRAVENOUS

## 2021-11-21 MED ORDER — LACTATED RINGERS IV SOLN: INTRAVENOUS | Status: DC

## 2021-11-21 MED ORDER — DEXTROSE IN LACTATED RINGERS 5 % IV SOLN: INTRAVENOUS | Status: DC

## 2021-11-21 NOTE — Discharge Instructions (Addendum)
Please take your medications as prescribed including the Levemir which is a long-acting insulin.  Please follow-up with your primary care provider as soon as you are able to.  Drink plenty of water and refrain from sugary beverages like sodas and refrain from alcohol.  Please read the information I have printed for you about diabetic diet.

## 2021-11-21 NOTE — ED Notes (Signed)
Pt would like to speak with PA as she is concerned about her urinary symptoms.  Pt clothing was wet from urine.  Changed into paperscrubs for way home.

## 2021-11-21 NOTE — ED Notes (Signed)
Checked CBG 322, RN Dorian Pod informed

## 2021-11-21 NOTE — ED Notes (Signed)
Pt reminded that urine sample is still needed.  Pt reports that she has been incontinent and is wearing a depend.  Helped undress pt and clean her up.  Placed her in a a new depend and applied purewick after cleaning labia with peri wipe.  Gave pt diet ginger ale.  Husband remains at the bedside.

## 2021-11-21 NOTE — Progress Notes (Signed)
Established Patient Office Visit  Subjective   Patient ID: Alicia Pena, female    DOB: October 08, 1970  Age: 51 y.o. MRN: [id number]  Chief Complaint  Patient presents with   Diabetes    Diabetes  Patient was last seen on 11/01/21 for DM follow-up. Thorough education provided at that appointment and patient was educated that she likely needed acute services, but declined. She had previously refused adjusting any medications, but at last appointment she agreed to increasing metformin, adding Jardiance, and starting basal insulin - education provided and instructed to schedule nurse visit to learn insulin injections.  At that visit lab was closed by the time she left and she was unable to leave urine specimen. Strict ED precautions were given.  11/21/21 DIABETES: - She is reporting worsening frequent urination - now waking up soaked multiple times a night. Reports excessive thirst has slowed down a little bit (thinks she is getting about 48 ounces of liquid usually water and occasionally milk).  - 5 days ago her Freestyle Libre sensor fell off (it was due to change that day anyway), she never replaced the sensor because she thought it was her fault it fell off. States she has gotten several high alarms, but never verified with a finger stick or called to ask what she should do.  - Per phone app, average glucose for the days she was wearing  the sensor was 375 mg/dL.  - Tomorrow she is scheduled to see nutritionist  - She is now reporting nausea/vomiting with any oral intake other than scrambled eggs - States she never started the basal insulin because she was scared she would mess it up. She had previously been instructed to call to schedule a nurse visit for teaching once she picked up the medications.  - Despite these symptoms, elevated glucose, and previously being thoroughly educated on the need to go to the hospital for these things, she reports she didn't know if it was necessary.  Admits that she is starting to get overwhelmed with all of this.  - Previous endocrinology referral was declined because they are unable to take patients right now, resending to another system to see if she can be seen somewhere else.  - Checking BG at home: [facility] - average for the time she was wearing is 375 - Medications: metformin 1,000 mg BID, Jardiance 10 mg daily, Levemir 10 units nightly taper instructions on sig - Compliance: metformin (100%), Jardiance (100%), insulin (0%) - Diet: she is trying to avoid carbs/sugars, but still reporting have cereal/milk, etc daily  - Exercise: none - Microalbumin: ordered today - Denies symptoms of hypoglycemia, numbness extremities, foot ulcers/trauma, wounds that are not healing     ROS All review of systems negative except what is listed in the HPI      Objective:     BP (!) 130/92 (BP Location: Left Arm, Patient Position: Sitting, Cuff Size: Normal)   Pulse 82   Resp 20   Ht 5' (1.524 m)   Wt 186 lb (84.4 kg)   SpO2 100%   BMI 36.33 kg/m    Physical Exam Vitals reviewed.  Constitutional:      General: She is not in acute distress.    Appearance: Normal appearance. She is obese. She is not ill-appearing.  Cardiovascular:     Rate and Rhythm: Normal rate and regular rhythm.  Pulmonary:     Effort: Pulmonary effort is normal.     Breath sounds: Normal breath sounds.  Skin:    General: Skin is warm and dry.  Neurological:     Mental Status: She is alert and oriented to person, place, and time. Mental status is at baseline.  Psychiatric:        Thought Content: Thought content normal.     Comments: Tearful, overwhelmed      Results for orders placed or performed in visit on 11/21/21  POCT Urinalysis Dipstick (Automated)  Result Value Ref Range   Color, UA yellow    Clarity, UA cloudy    Glucose, UA Negative Negative   Bilirubin, UA negative    Ketones, UA '40mg'$     Spec Grav, UA 1.010 1.010 - 1.025    Blood, UA moderate    pH, UA 5.0 5.0 - 8.0   Protein, UA Negative Negative   Urobilinogen, UA 0.2 0.2 or 1.0 E.U./dL   Nitrite, UA negative    Leukocytes, UA Negative Negative      The ASCVD Risk score (Arnett DK, et al., 2019) failed to calculate for the following reasons:   Cannot find a previous HDL lab   Cannot find a previous total cholesterol lab    Assessment & Plan:   Problem List Items Addressed This Visit       Endocrine   Type 2 diabetes mellitus without complication, without long-term current use of insulin ([place]) - Primary    Patient has become more symptomatic as described above. Initially she agreed to start insulin tonight based on our office discussion. However, UA in office showed moderate ketones and CBG was unreadable (>400), so she was advised to go to the ED immediately. Patient agreeable, husband escorting her downstairs. Report was called to [facility] ED, [place].         Return for after ED visit .    Terrilyn Saver, NP

## 2021-11-21 NOTE — ED Provider Notes (Addendum)
Accepted handoff at shift change from [facility]. Please see prior provider note for more detail.   Briefly: Patient is 51 y.o.   Hyperglycemia due to medication noncompliance.  States that she was diagnosed approximately 1 month ago has not taken her medications because of worries with weight neutrality.  DDX: concern for   Plan: hydrate, follow up on CBG DC home     Physical Exam  BP (!) 134/92   Pulse 99   Temp 98 F (36.7 C) (Oral)   Resp (!) 23   Ht 5' (1.524 m)   Wt 84.4 kg   SpO2 98%   BMI 36.33 kg/m   Physical Exam  Procedures  Procedures Results for orders placed or performed during the hospital encounter of 11/21/21  Comprehensive metabolic panel  Result Value Ref Range   Sodium 134 (L) 135 - 145 mmol/L   Potassium 3.6 3.5 - 5.1 mmol/L   Chloride 94 (L) 98 - 111 mmol/L   CO2 24 22 - 32 mmol/L   Glucose, Bld 554 (HH) 70 - 99 mg/dL   BUN 10 6 - 20 mg/dL   Creatinine, Ser 0.73 0.44 - 1.00 mg/dL   Calcium 9.5 8.9 - 10.3 mg/dL   Total Protein 8.0 6.5 - 8.1 g/dL   Albumin 3.8 3.5 - 5.0 g/dL   AST 46 (H) 15 - 41 U/L   ALT 72 (H) 0 - 44 U/L   Alkaline Phosphatase 110 38 - 126 U/L   Total Bilirubin 2.5 (H) 0.3 - 1.2 mg/dL   GFR, Estimated >60 >60 mL/min   Anion gap 16 (H) 5 - 15  CBC with Differential  Result Value Ref Range   WBC 5.5 4.0 - 10.5 K/uL   RBC 4.52 3.87 - 5.11 MIL/uL   Hemoglobin 13.5 12.0 - 15.0 g/dL   HCT 40.5 36.0 - 46.0 %   MCV 89.6 80.0 - 100.0 fL   MCH 29.9 26.0 - 34.0 pg   MCHC 33.3 30.0 - 36.0 g/dL   RDW 17.5 (H) 11.5 - 15.5 %   Platelets 240 150 - 400 K/uL   nRBC 0.0 0.0 - 0.2 %   Neutrophils Relative % 73 %   Neutro Abs 4.1 1.7 - 7.7 K/uL   Lymphocytes Relative 17 %   Lymphs Abs 0.9 0.7 - 4.0 K/uL   Monocytes Relative 7 %   Monocytes Absolute 0.4 0.1 - 1.0 K/uL   Eosinophils Relative 1 %   Eosinophils Absolute 0.1 0.0 - 0.5 K/uL   Basophils Relative 1 %   Basophils Absolute 0.1 0.0 - 0.1 K/uL   Immature Granulocytes 1 %    Abs Immature Granulocytes 0.03 0.00 - 0.07 K/uL  POC CBG, ED  Result Value Ref Range   Glucose-Capillary 552 (HH) 70 - 99 mg/dL   Comment 1 Notify RN   CBG monitoring, ED  Result Value Ref Range   Glucose-Capillary 495 (H) 70 - 99 mg/dL  I-Stat venous blood gas, ED  Result Value Ref Range   pH, Ven 7.422 7.25 - 7.43   pCO2, Ven 40.4 (L) 44 - 60 mmHg   pO2, Ven 28 (LL) 32 - 45 mmHg   Bicarbonate 26.3 20.0 - 28.0 mmol/L   TCO2 28 22 - 32 mmol/L   O2 Saturation 54 %   Acid-Base Excess 2.0 0.0 - 2.0 mmol/L   Sodium 134 (L) 135 - 145 mmol/L   Potassium 3.4 (L) 3.5 - 5.1 mmol/L   Calcium, Ion 1.15  1.15 - 1.40 mmol/L   HCT 43.0 36.0 - 46.0 %   Hemoglobin 14.6 12.0 - 15.0 g/dL   Collection site IV start    Drawn by Nurse    Sample type VENOUS    Comment NOTIFIED PHYSICIAN   CBG monitoring, ED  Result Value Ref Range   Glucose-Capillary 443 (H) 70 - 99 mg/dL   No results found.  ED Course / MDM    Medical Decision Making Amount and/or Complexity of Data Reviewed Labs: ordered.  Risk Prescription drug management.   Patient with mild transaminitis this is been present in the past she states that she has been told that is  ?  Cancer related. Not significantly changed today.  She will resume her metformin at home. Not in DKA.  Urinalysis with significant glucosuria, few bacteria and+;leukocytes/nitrates. We will treat with Keflex.  Given first dose here in the ER.  Urine culture added on.  Repeat CBG 322.  Discharged home at this time she is tolerating p.o. states that she has her home antihyperglycemic medications.       Tedd Sias, [place] 11/21/21 2134    Tedd Sias, PA 11/21/21 2141    Isla Pence, MD 11/21/21 2255

## 2021-11-21 NOTE — ED Provider Notes (Signed)
[facility] Provider Note   CSN: [id number] Arrival date & time: 11/21/21  1631     History  Chief Complaint  Patient presents with   Hyperglycemia    Alicia Pena is a 51 y.o. female with a history of diabetes diagnosed last month presenting from her PCP due to hyperglycemia.  She was found to be hypoglycemic in the the 500s with ketones in her urine.  Says that she takes her metformin and Jardiance as prescribed she does not use the insulin that has been prescribed to her because she is unsure how to use the pen but also because she heard that it may cause her to gain weight and she has worked very hard not to lose weight.  Endorses dysuria, polyuria, polydipsia, nocturia, fatigue and occasional nausea.   Hyperglycemia     Home Medications Prior to Admission medications   Medication Sig Start Date End Date Taking? Authorizing Provider  anastrozole (ARIMIDEX) 1 MG tablet Take 1 mg by mouth daily. 07/01/21   [provider]  Continuous Blood Gluc Sensor (FREESTYLE LIBRE 3 SENSOR) MISC 1 each by Does not apply route every 14 (fourteen) days. Place 1 sensor on the skin every 14 days. Use to check glucose continuously 10/04/21   Terrilyn Saver, NP  empagliflozin (JARDIANCE) 10 MG TABS tablet Take 1 tablet (10 mg total) by mouth daily before breakfast. 11/01/21   Terrilyn Saver, NP  IBRANCE 75 MG tablet Take by mouth. 09/01/21   [provider]  insulin detemir (LEVEMIR FLEXTOUCH) 100 UNIT/ML FlexPen Inject 10 Units into the skin at bedtime. Start with 10 units every night. Titrate up by 2 units every 3 days if morning/fasting glucose is higher than 130 consistently. 11/11/21   Terrilyn Saver, NP  Insulin Pen Needle (PEN NEEDLES) 32G X 4 MM MISC 1 each by Does not apply route daily. 11/01/21   Terrilyn Saver, NP  losartan (COZAAR) 25 MG tablet Take 1 tablet (25 mg total) by mouth daily. 10/04/21   Terrilyn Saver, NP  metFORMIN (GLUCOPHAGE)  1000 MG tablet Take 1 tablet (1,000 mg total) by mouth 2 (two) times daily with a meal. 11/01/21   Terrilyn Saver, NP  ondansetron (ZOFRAN) 8 MG tablet Take by mouth. 09/02/20   [provider]  XARELTO 20 MG TABS tablet Take 20 mg by mouth daily. 09/15/21   [provider]      Allergies    Duricef [cefadroxil]    Review of Systems   Review of Systems  Physical Exam Updated Vital Signs BP (!) 151/96 (BP Location: Right Arm)   Pulse (!) 112   Temp 98 F (36.7 C) (Oral)   Resp 20   Ht 5' (1.524 m)   Wt 84.4 kg   SpO2 96%   BMI 36.33 kg/m  Physical Exam Vitals and nursing note reviewed.  Constitutional:      General: She is not in acute distress.    Appearance: Normal appearance. She is not ill-appearing.  HENT:     Head: Normocephalic and atraumatic.     Mouth/Throat:     Mouth: Mucous membranes are dry.     Pharynx: Oropharynx is clear.  Eyes:     General: No scleral icterus.    Conjunctiva/sclera: Conjunctivae normal.  Cardiovascular:     Rate and Rhythm: Normal rate and regular rhythm.  Pulmonary:     Effort: Pulmonary effort is normal. No respiratory distress.  Abdominal:  General: Abdomen is flat.     Palpations: Abdomen is soft.     Tenderness: There is no abdominal tenderness.  Skin:    General: Skin is warm and dry.     Findings: No rash.  Neurological:     Mental Status: She is alert.  Psychiatric:        Mood and Affect: Mood normal.        Behavior: Behavior normal.    ED Results / Procedures / Treatments   Labs (all labs ordered are listed, but only abnormal results are displayed) Labs Reviewed  COMPREHENSIVE METABOLIC PANEL - Abnormal; Notable for the following components:      Result Value   Sodium 134 (*)    Chloride 94 (*)    Glucose, Bld 554 (*)    AST 46 (*)    ALT 72 (*)    Total Bilirubin 2.5 (*)    Anion gap 16 (*)    All other components within normal limits  CBC WITH DIFFERENTIAL/PLATELET - Abnormal; Notable for  the following components:   RDW 17.5 (*)    All other components within normal limits  CBG MONITORING, ED - Abnormal; Notable for the following components:   Glucose-Capillary 552 (*)    All other components within normal limits  CBG MONITORING, ED - Abnormal; Notable for the following components:   Glucose-Capillary 495 (*)    All other components within normal limits  I-STAT VENOUS BLOOD GAS, ED - Abnormal; Notable for the following components:   pCO2, Ven 40.4 (*)    pO2, Ven 28 (*)    Sodium 134 (*)    Potassium 3.4 (*)    All other components within normal limits  BETA-HYDROXYBUTYRIC ACID  URINALYSIS, ROUTINE W REFLEX MICROSCOPIC    EKG None  Radiology No results found.  Procedures Procedures   Medications Ordered in ED Medications - No data to display  ED Course/ Medical Decision Making/ A&P                           Medical Decision Making Amount and/or Complexity of Data Reviewed Labs: ordered.  Risk Prescription drug management.   This patient presents to the ED for concern of hyperglycemia in the setting of known type 2 diabetes.  Insulin nonadherence. Differential includes but is not limited to HHS, DKA, hyperglycemia, pancreatitis.   This is not an exhaustive differential.    Past Medical History / Co-morbidities / Social History: Hypertension,Uncontrolled type 2 diabetes, recently diagnosed   Additional history: Additional history obtained from chart review and telephone encounter with patient's PCP.  Patient has been nonadherent to her medication regimen for diabetes.  She is having a hard time understanding the severity of the disease and becomes increasingly symptomatic.     Physical Exam: Physical exam performed. The pertinent findings include: Dry mucous membranes Borderline tachycardic 98-101  Lab Tests: I ordered, and personally interpreted labs.  The pertinent results include:  -POC glucose 552 -pH 7.4 -Potassium 3.6 -Anion gap 16 -UA  from pcp without infx   Cardiac Monitoring:  The patient was maintained on a cardiac monitor.  Remains in normal sinus   Medications: I ordered medication including fluids, insulin per insulin hold. Reevaluation of the patient after these medicines showed that the patient improved.  Blood sugar 495 on recheck.   Disposition: Patient is currently being treated with fluids and insulin.  This should help close her gap and bring down  her blood sugar.  She will need to be discharged with quick follow-up with PCP and encouragement to start the insulin as prescribed.  At this time she has been signed out to [employer].  See his note for the remainder of patient's work-up and disposition.  Final Clinical Impression(s) / ED Diagnoses Final diagnoses:  Hyperglycemia      Rhae Hammock, PA-C 11/21/21 1836    Isla Pence, MD 11/21/21 [phone]

## 2021-11-21 NOTE — ED Notes (Signed)
EDPA advises to stop insulin drip and to bolus the rest of the LR infusion.

## 2021-11-21 NOTE — ED Triage Notes (Signed)
Pt dx with DM in April. Pt was at PCP for follow up and sent her due to high CBG and ketones in her urine. Pt states she takes metformin and Jardiance. Pt also supposed to take an insulin pen, but she hasn't been using it because she didn't know how to use it.  Pt reports dysuria as well.

## 2021-11-21 NOTE — Assessment & Plan Note (Signed)
Patient has become more symptomatic as described above. Initially she agreed to start insulin tonight based on our office discussion. However, UA in office showed moderate ketones and CBG was unreadable (>400), so she was advised to go to the ED immediately. Patient agreeable, husband escorting her downstairs. Report was called to [facility] ED, [place].

## 2021-11-22 ENCOUNTER — Encounter: Payer: Managed Care, Other (non HMO) | Attending: Family Medicine | Admitting: Skilled Nursing Facility1

## 2021-11-22 DIAGNOSIS — E119 Type 2 diabetes mellitus without complications: Secondary | ICD-10-CM | POA: Insufficient documentation

## 2021-11-22 LAB — BETA-HYDROXYBUTYRIC ACID: Beta-Hydroxybutyric Acid: 1.89 mmol/L — ABNORMAL HIGH (ref 0.05–0.27)

## 2021-11-22 NOTE — Progress Notes (Signed)
Pt arrives in a wheel chair with her husband pushing her.  Pt states her breast cancer has metastasized to her hip. 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 Pt state she hates being dependant on people and has a lot of stairs in her home. Pt states she has tried in the past to have someone go through her home for her mobility issues to be able to move about her home but this did not come to anything useful she states. Pt states he has been in a rehab center but did not find it helpful.   Pt states she has completed her radiation and is in year 2 of cancer treatment.   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   Pt is feeling extremely frustrated.   Pt states she went through a time of throwing up everything she ate and drank for about a month stating raisins and nuts and cheese went fine.   Pt sates she does not take her Jardaince daily due to she needs to figure out how to get into the wheel chair, to a different part of her house, and wrangle cats when her husband leaves for work at 4:30am. Dietitian assisted pt in figuring out a plan for this.   Pt states she will not drink faucet water.  Pts husband is going to go to the pharmacy and learn how to do the insulin for his wife.   Pt states her libre fell off last night and cannot find it.   Pt was Able to recall the need for insulin.   Pts husband states he does not want EMS to knock down the door again so is hesitant to call 911 in a medical emergency for his wife.   Pt states she takes her pills with milk.   DM: Jardiance '10mg'$  Levemir 10 units Metformin  Goals/Conversation -choose 2% milk -15-30 grams carbohydrate per meal: NOT UNDER 15; 30 is good not bad -single digits of sugar on the nutrition facts label  -single digits of fat on the nutrition facts label -add nuts to your cereal or have 1 boiled egg on the side  -1 packet of oatmeal with nuts or the high fiber or  high protein oatmeal ([place]ak or quaker) -English muffin with peanut butter -choose whole wheat bread or whole wheat pasta  -be sure to have non starchy vegetables with lunch and dinner: low sodium canned vegetables are okay -eat breakfast and then turn around and take your Jardiance and metformin  -call the [employer] and see if they can send you another one since it did not stick - Start with 10 units every night. Titrate up by 2 units every 3 days if morning/fasting glucose is higher than 130 consistently. -take second metformin right after eating dinner then after relaxing a little bit and cleaning up after dinner take the other medications  -choose brown rice over white rice -any sugar free drink is a fair option or hot tea; try sugar free juice  -lasagna: whole wheat pasta a square the size of your palm with a side salad -mashed potatoes: small pat of butter made with low fat milk  cup -limit fruit to 2-3 times a day; half a banana is one serving  Diabetes Self-Management Education  Visit Type: First/Initial   11/29/2021  Alicia Pena, identified by name and date of birth, is a 51 y.o. female with a diagnosis of Diabetes:  Type 2.   ASSESSMENT  There were no vitals taken for this visit. There is no height or weight on file to calculate BMI.   Diabetes Self-Management Education - 11/29/21 0801       Visit Information   Visit Type First/Initial      Initial Visit   Diabetes Type Type 2    Are you currently following a meal plan? No    Are you taking your medications as prescribed? No      Health Coping   How would you rate your overall health? Poor      Psychosocial Assessment   Patient Belief/Attitude about Diabetes Denial    What is the hardest part about your diabetes right now, causing you the most concern, or is the most worrisome to you about your diabetes?   Taking/obtaining medications;Making healty food and beverage choices    Self-care barriers  Debilitated state due to current medical condition;Low literacy;Unsteady gait/risk for falls    Self-management support Friends;Family    Other persons present Spouse/SO    Patient Concerns Nutrition/Meal planning;Healthy Lifestyle;Glycemic Control    Special Needs Large print;Simplified materials;Verbal instruction    Preferred Learning Style Visual;Hands on    Learning Readiness Contemplating    How often do you need to have someone help you when you read instructions, pamphlets, or other written materials from your doctor or pharmacy? 2 - Rarely      Pre-Education Assessment   Patient understands the diabetes disease and treatment process. Needs Instruction    Patient understands incorporating nutritional management into lifestyle. Needs Instruction    Patient undertands incorporating physical activity into lifestyle. Needs Instruction    Patient understands using medications safely. Needs Instruction    Patient understands monitoring blood glucose, interpreting and using results Needs Instruction    Patient understands prevention, detection, and treatment of acute complications. Needs Instruction    Patient understands prevention, detection, and treatment of chronic complications. Needs Instruction    Patient understands how to develop strategies to address psychosocial issues. Needs Instruction    Patient understands how to develop strategies to promote health/change behavior. Needs Instruction      Complications   Last HgB A1C per patient/outside source 9.1 %    How often do you check your blood sugar? 0 times/day (not testing)    Have you had a dilated eye exam in the past 12 months? Yes    Have you had a dental exam in the past 12 months? Yes    Are you checking your feet? No      Activity / Exercise   Activity / Exercise Type ADL's    How many days per week do you exercise? 0    How many minutes per day do you exercise? 0    Total minutes per week of exercise 0      Patient  Education   Previous Diabetes Education No    Disease Pathophysiology Factors that contribute to the development of diabetes;Explored patient's options for treatment of their diabetes    Healthy Eating Role of diet in the treatment of diabetes and the relationship between the three main macronutrients and blood glucose level;Plate Method;Food label reading, portion sizes and measuring food.;Carbohydrate counting;Reviewed blood glucose goals for pre and post meals and how to evaluate the patients' food intake on their blood glucose level.;Meal options for control of blood glucose level and chronic complications.    Being Active Role of exercise on diabetes management, blood pressure control and  cardiac health.    Medications Taught/reviewed insulin/injectables, injection, site rotation, insulin/injectables storage and needle disposal.;Reviewed patients medication for diabetes, action, purpose, timing of dose and side effects.    Monitoring Yearly dilated eye exam;Daily foot exams;Identified appropriate SMBG and/or A1C goals.;Taught/evaluated SMBG meter.;Taught/evaluated CGM (comment);Purpose and frequency of SMBG.    Acute complications Taught prevention, symptoms, and  treatment of hypoglycemia - the 15 rule.;Discussed and identified patients' prevention, symptoms, and treatment of hyperglycemia.    Chronic complications Relationship between chronic complications and blood glucose control;Assessed and discussed foot care and prevention of foot problems;Dental care;Retinopathy and reason for yearly dilated eye exams    Diabetes Stress and Support Identified and addressed patients feelings and concerns about diabetes;Worked with patient to identify barriers to care and solutions;Role of stress on diabetes    Lifestyle and Health Coping Lifestyle issues that need to be addressed for better diabetes care      Individualized Goals (developed by patient)   Nutrition General guidelines for healthy choices and  portions discussed;Follow meal plan discussed    Medications take my medication as prescribed    Monitoring  Test my blood glucose as discussed;Consistenly use CGM;Test blood glucose pre and post meals as discussed    Problem Solving Sleep Pattern;Eating Pattern;Medication consistency;Addressing barriers to behavior change    Reducing Risk do foot checks daily;treat hypoglycemia with 15 grams of carbs if blood glucose less than '70mg'$ /dL    Health Coping Discuss barriers to diabetes care with support person/system (comment specifics as needed)      Post-Education Assessment   Patient understands the diabetes disease and treatment process. Comprehends key points    Patient understands incorporating nutritional management into lifestyle. Comprehends key points    Patient undertands incorporating physical activity into lifestyle. Comprehends key points    Patient understands using medications safely. Comphrehends key points    Patient understands monitoring blood glucose, interpreting and using results Comprehends key points    Patient understands prevention, detection, and treatment of acute complications. Comprehends key points    Patient understands prevention, detection, and treatment of chronic complications. Comprehends key points    Patient understands how to develop strategies to address psychosocial issues. Comprehends key points    Patient understands how to develop strategies to promote health/change behavior. Comprehends key points      Outcomes   Expected Outcomes Demonstrated interest in learning but significant barriers to change    Future DMSE 2 months    Program Status Completed             Individualized Plan for Diabetes Self-Management Training:   Learning Objective:  Patient will have a greater understanding of diabetes self-management. Patient education plan is to attend individual and/or group sessions per assessed needs and concerns.    Expected Outcomes:   Demonstrated interest in learning but significant barriers to change  Education material provided: ADA - How to Thrive: A Guide for Your Journey with Diabetes, Meal plan card, My Plate, Support group flyer, and Carbohydrate counting sheet  If problems or questions, patient to contact team via:  Phone and Email  Future DSME appointment: 2 months

## 2021-11-24 LAB — URINE CULTURE: Culture: >=100000 — AB

## 2021-11-25 ENCOUNTER — Telehealth: Payer: Self-pay | Admitting: *Deleted

## 2021-11-25 NOTE — Telephone Encounter (Signed)
Post ED Visit - Positive Culture Follow-up  Culture report reviewed by antimicrobial stewardship pharmacist: [place] Team '[]'$  Elenor Quinones, Pharm.D. '[]'$  Heide Guile, Pharm.D., BCPS AQ-ID '[]'$  Parks Neptune, Pharm.D., BCPS '[]'$  Alycia Rossetti, Pharm.D., BCPS '[]'$  [place], Pharm.D., BCPS, AAHIVP '[]'$  Legrand Como, Pharm.D., BCPS, AAHIVP '[]'$  Salome Arnt, PharmD, BCPS '[]'$  Johnnette Gourd, PharmD, BCPS '[]'$  Hughes Better, PharmD, BCPS '[]'$  Leeroy Cha, PharmD '[]'$  Laqueta Linden, PharmD, BCPS '[]'$  Albertina Parr, PharmD  Industry Team '[]'$  Leodis Sias, PharmD '[]'$  Lindell Spar, PharmD '[]'$  Royetta Asal, PharmD '[]'$  Graylin Shiver, Rph '[]'$  Rema Fendt) Glennon Mac, PharmD '[]'$  Arlyn Dunning, PharmD '[]'$  Netta Cedars, PharmD '[]'$  Dia Sitter, PharmD '[]'$  Leone Haven, PharmD '[]'$  Gretta Arab, PharmD '[]'$  Theodis Shove, PharmD '[]'$  Peggyann Juba, PharmD '[]'$  Reuel Boom, PharmD   Positive urine culture Treated with Nitrofurantoin Monohyd Macro, organism sensitive to the same and no further patient follow-up is required at this time.  Joseph Art, PharmD  Harlon Flor Talley 11/25/2021, 9:28 AM

## 2021-11-29 ENCOUNTER — Telehealth: Payer: Self-pay | Admitting: Family Medicine

## 2021-11-29 ENCOUNTER — Encounter: Payer: Self-pay | Admitting: Skilled Nursing Facility1

## 2021-11-29 NOTE — Telephone Encounter (Signed)
Pt stated she has blurry vision that is getting increasingly worse. Transferred to triage.

## 2021-11-29 NOTE — Telephone Encounter (Signed)
Nurse Assessment Nurse: Ysidro Evert, RN, Levada Dy Date/Time Eilene Ghazi Time): 11/29/2021 2:04:43 PM Confirm and document reason for call. If symptomatic, describe symptoms. ---Caller states she is having blurred vision since last night. She was recently diagnosed with diabetes Does the patient have any new or worsening symptoms? ---Yes Will a triage be completed? ---Yes Related visit to physician within the last 2 weeks? ---No Does the PT have any chronic conditions? (i.e. diabetes, asthma, this includes High risk factors for pregnancy, etc.) ---Yes List chronic conditions. ---diabetes, hypertension Is the patient pregnant or possibly pregnant? (Ask all females between the ages of 21-55) ---No Is this a behavioral health or substance abuse call? ---No Guidelines Guideline Title Affirmed Question Affirmed Notes Nurse Date/Time (Eastern Time) Vision Loss or Change [1] Blurred vision or visual changes AND [2] present now AND [3] sudden onset or new (e.g., minutes, hours, days) (Exception: Seeing floaters / black specks OR previously Ysidro Evert, Therapist, sports, Levada Dy 11/29/2021 2:07:01 PM PLEASE NOTE: All timestamps contained within this report are represented as [place] Standard Time. CONFIDENTIALTY NOTICE: This fax transmission is intended only for the addressee. It contains information that is legally privileged, confidential or otherwise protected from use or disclosure. If you are not the intended recipient, you are strictly prohibited from reviewing, disclosing, copying using or disseminating any of this information or taking any action in reliance on or regarding this information. If you have received this fax in error, please notify us immediately by telephone so that we can arrange for its return to [place]. Phone: [phone], Toll-Free: [phone], Fax: [phone] Page: 2 of 2 Call Id: [id number] Guidelines Guideline Title Affirmed Question Affirmed Notes Nurse Date/Time  Eilene Ghazi Time) diagnosed migraine headaches with same symptoms.) Disp. Time Eilene Ghazi Time) Disposition Final User 11/29/2021 2:13:35 PM Go to ED Now (or PCP triage) Yes Ysidro Evert, RN, Marin Shutter Disagree/Comply Disagree Caller Understands Yes PreDisposition Did not know what to do Care Advice Given Per Guideline GO TO ED NOW (OR PCP TRIAGE): * IF NO PCP (PRIMARY CARE PROVIDER) SECOND-LEVEL TRIAGE: You need to be seen within the next hour. Go to the [facility] at _____________ [place] as soon as you can. ANOTHER ADULT SHOULD DRIVE: * It is better and safer if another adult drives instead of you. CARE ADVICE given per Vision Loss or Change (Adult) guideline. Referrals GO TO FACILITY REFUSED

## 2021-12-06 ENCOUNTER — Ambulatory Visit: Payer: Managed Care, Other (non HMO) | Admitting: Family Medicine

## 2021-12-07 ENCOUNTER — Encounter: Payer: Self-pay | Admitting: Family Medicine

## 2021-12-07 ENCOUNTER — Ambulatory Visit: Payer: Managed Care, Other (non HMO) | Admitting: Family Medicine

## 2021-12-07 DIAGNOSIS — E119 Type 2 diabetes mellitus without complications: Secondary | ICD-10-CM | POA: Diagnosis not present

## 2021-12-07 DIAGNOSIS — I10 Essential (primary) hypertension: Secondary | ICD-10-CM

## 2021-12-07 MED ORDER — LOSARTAN POTASSIUM 25 MG PO TABS: 25.0000 mg | ORAL_TABLET | Freq: Every day | ORAL | 1 refills | Status: DC

## 2021-12-07 MED ORDER — EMPAGLIFLOZIN 10 MG PO TABS: 10.0000 mg | ORAL_TABLET | Freq: Every day | ORAL | 1 refills | Status: DC

## 2021-12-07 NOTE — Progress Notes (Signed)
Established Patient Office Visit  Subjective   Patient ID: Alicia Pena, female    DOB: August 10, 1970  Age: 51 y.o. MRN: [id number]  CC: ED f/u for DM   HPI Patient is here for 2 week DM follow-up. Reports that since recent ED visit and meeting with nutritionist she has started taking her insulin as prescribed, is noticing improvement, and is more determined to be compliant with medications and diet. She is feeling noticeably better. Thirst has returned to average and urinary symptoms have significantly improved. She continues to have some blurred vision that fluctuates in severity, but she has called ophthalmologist and is scheduled to see them next week. Reports she did get up to 16 units of Levemir a few nights ago and ended up waking up in the morning with a low alarm (67), so she skipped her oral meds that day and reduced to 14 units of Levemir the following the night with no more low alarms. She states that she is feeling more confident with the insulin now.    DIABETES:  - Checking BG at home: Theressa Stamps avg readings for the past week 129-200 - Medications: Levemir 14 units nightly; metformin 1,000 mg BID, Jardiance 10 mg daily - Compliance: good - Diet: met with nutritionist has been doing better at limiting carbs;  - Exercise: minimal - Eye exam: has had some blurred vision for the past week or two - she call her eye doctor and they are seeing her next week  - Foot exam: today  - Denies symptoms of hypoglycemia, polyuria, polydipsia, numbness extremities, foot ulcers/trauma, wounds that are not healing, medication side effects          ROS All review of systems negative except what is listed in the HPI    Objective:     BP 133/86   Pulse (!) 101   Ht 5' (1.524 m)   Wt 183 lb (83 kg)   BMI 35.74 kg/m    Physical Exam Vitals reviewed.  Constitutional:      Appearance: Normal appearance. She is obese.  Cardiovascular:     Rate and Rhythm: Normal  rate and regular rhythm.  Pulmonary:     Effort: Pulmonary effort is normal.     Breath sounds: Normal breath sounds.  Musculoskeletal:     Right lower leg: Edema present.     Left lower leg: Edema present.  Skin:    General: Skin is warm and dry.     Capillary Refill: Capillary refill takes less than 2 seconds.     Comments: Diabetic foot exam was performed.  No deformities or other abnormal visual findings.  Posterior tibialis and dorsalis pulse intact bilaterally.  Intact to touch and monofilament testing bilaterally.    Neurological:     General: No focal deficit present.     Mental Status: She is alert and oriented to person, place, and time. Mental status is at baseline.  Psychiatric:        Mood and Affect: Mood normal.        Behavior: Behavior normal.        Thought Content: Thought content normal.        Judgment: Judgment normal.     Comments: Much better spirits today compared to previous visits      No results found for any visits on 12/07/21.    The ASCVD Risk score (Arnett DK, et al., 2019) failed to calculate for the following reasons:   Cannot find a  previous HDL lab   Cannot find a previous total cholesterol lab    Assessment & Plan:   1. Type 2 diabetes mellitus without complication, without long-term current use of insulin (HCC) Glad to hear you are doing much better and sugars are looking better. Continue medications as prescribed. If fasting sugars are less than 130, start reducing your night time insuline by 2 units a day. If you are getting frequent low alarms or have any concerns, please call or send [place] a message for advice on dosing the insulin.  Refilling Jardiance at 90-day supply and new pharmacy per patient request.  She feels more comfortable doing another 2 week follow-up.  Safety and education reinforced.  Patient aware of signs/symptoms requiring further/urgent evaluation.   - empagliflozin (JARDIANCE) 10 MG TABS tablet; Take 1 tablet  (10 mg total) by mouth daily before breakfast.  Dispense: 90 tablet; Refill: 1 - Comprehensive metabolic panel  2. Primary hypertension Doing much better. Refilling with 90 day supply and new pharmacy per patient request.  - losartan (COZAAR) 25 MG tablet; Take 1 tablet (25 mg total) by mouth daily.  Dispense: 90 tablet; Refill: 1   Return in about 2 weeks (around 12/21/2021) for DM f/u .    Terrilyn Saver, NP

## 2021-12-07 NOTE — Patient Instructions (Addendum)
Glad to hear you are doing much better and sugars are looking better. Continue medications as prescribed. If fasting sugars are less than 130, start reducing your night time insuline by 2 units a day. If you are getting frequent low alarms or have any concerns, please call or send [place] a message for advise on dosing the insulin.

## 2021-12-15 ENCOUNTER — Telehealth: Payer: Self-pay | Admitting: Family Medicine

## 2021-12-15 DIAGNOSIS — G808 Other cerebral palsy: Secondary | ICD-10-CM

## 2021-12-15 NOTE — Telephone Encounter (Signed)
Pt's ins was calling to follow up on getting a new wheelchair. They stated they had called once before but never heard anything back. She is just in need of a standard wheelchair. Levada Dy stated if we have any questions we can call [phone] ext. [phone].

## 2021-12-16 NOTE — Telephone Encounter (Signed)
LMOM with Levada Dy from [facility] requesting a call back with information on where to send the wheelchair order.

## 2021-12-22 ENCOUNTER — Ambulatory Visit (INDEPENDENT_AMBULATORY_CARE_PROVIDER_SITE_OTHER): Payer: Managed Care, Other (non HMO) | Admitting: Family Medicine

## 2021-12-22 ENCOUNTER — Encounter: Payer: Self-pay | Admitting: Family Medicine

## 2021-12-22 DIAGNOSIS — E119 Type 2 diabetes mellitus without complications: Secondary | ICD-10-CM

## 2021-12-22 MED ORDER — METFORMIN HCL 1000 MG PO TABS: 1000.0000 mg | ORAL_TABLET | Freq: Two times a day (BID) | ORAL | 3 refills | Status: DC

## 2021-12-22 NOTE — Assessment & Plan Note (Addendum)
Patient is feeling significantly better and average glucose readings are in the 130s now! Continue current regimen for now (Levemir 14 units nightly, metformin 1,000 mg BID, Jardiance 10 mg daily). She prefers to have another 2 week follow-up for close monitoring/accountability - by that time she will be due for labs again. Education reinforced. She is scheduled to see nutritionist again next month.  Patient aware of signs/symptoms requiring further/urgent evaluation.

## 2021-12-22 NOTE — Progress Notes (Signed)
Established Patient Office Visit  Subjective   Patient ID: Alicia Pena, female    DOB: July 30, 1970  Age: 51 y.o. MRN: [id number]  Chief Complaint  Patient presents with   2 week follow up for DM    Concerns/ questions: pt has questions about Pre-Dawn phenomenon and food diary.       HPI 2 week DM follow-up. Husband with her for visit.  DIABETES: Reports she is doing much better overall. Incontinence episodes are much better, she will have one every now and then, but only if she is really busy/stressed, full bladder.  She has not had any more low or high alarms on glucometer. In the past 2 weeks she has been in the "yellow zone" a couple of times on [place], but otherwise has stayed in the "green zone."   - Checking BG at home: Theressa Stamps avg readings for the past 2 weeks with overall avg in 130s; fasting glucose continues to fluctuate 129-179 (avg 6am reading = 138); no lows during the night - Medications: Levemir 14 units nightly; metformin 1,000 mg BID, Jardiance 10 mg daily - Compliance: good - Diet: low-carb  - Exercise: minimal - Eye exam: scheduled - Foot exam: last appt  - Denies symptoms of hypoglycemia, polyuria, polydipsia, numbness extremities, foot ulcers/trauma, wounds that are not healing, medication side effects       ROS All review of systems negative except what is listed in the HPI    Objective:     BP 126/80 (BP Location: Left Arm, Patient Position: Sitting, Cuff Size: Large)   Pulse (!) 50   Temp 97.9 F (36.6 C) (Oral)   Resp 18   Ht 5' (1.524 m)   Wt 182 lb 9.6 oz (82.8 kg)   SpO2 97%   BMI 35.66 kg/m    Physical Exam Vitals reviewed.  Constitutional:      General: She is not in acute distress.    Appearance: Normal appearance. She is obese. She is not ill-appearing.  Cardiovascular:     Rate and Rhythm: Normal rate and regular rhythm.  Pulmonary:     Effort: Pulmonary effort is normal.     Breath sounds: Normal breath  sounds.  Neurological:     General: No focal deficit present.     Mental Status: She is alert and oriented to person, place, and time. Mental status is at baseline.  Psychiatric:        Mood and Affect: Mood normal.        Behavior: Behavior normal.        Thought Content: Thought content normal.        Judgment: Judgment normal.      No results found for any visits on 12/22/21.    The ASCVD Risk score (Arnett DK, et al., 2019) failed to calculate for the following reasons:   Cannot find a previous HDL lab   Cannot find a previous total cholesterol lab    Assessment & Plan:   Problem List Items Addressed This Visit       Endocrine   Type 2 diabetes mellitus without complication, without long-term current use of insulin ([place])    Patient is feeling significantly better and average glucose readings are in the 130s now! Continue current regimen for now (Levemir 14 units nightly, metformin 1,000 mg BID, Jardiance 10 mg daily). She prefers to have another 2 week follow-up for close monitoring/accountability - by that time she will be due for labs again. Education  reinforced. She is scheduled to see nutritionist again next month.  Patient aware of signs/symptoms requiring further/urgent evaluation.       Relevant Medications   metFORMIN (GLUCOPHAGE) 1000 MG tablet   Other Relevant Orders   Hemoglobin A1c   CBC   Comprehensive metabolic panel   Lipid panel   TSH    Return in about 2 weeks (around 01/05/2022) for 2 week DM f/u; she would like to schedule lab appointment same day, but prior to appointment.    Terrilyn Saver, NP

## 2022-01-05 ENCOUNTER — Encounter: Payer: Self-pay | Admitting: Family Medicine

## 2022-01-05 ENCOUNTER — Other Ambulatory Visit: Payer: Managed Care, Other (non HMO)

## 2022-01-05 ENCOUNTER — Ambulatory Visit: Payer: Managed Care, Other (non HMO) | Admitting: Family Medicine

## 2022-01-05 VITALS — BP 136/98 | HR 88 | Ht 60.0 in | Wt 179.8 lb

## 2022-01-05 DIAGNOSIS — I10 Essential (primary) hypertension: Secondary | ICD-10-CM | POA: Diagnosis not present

## 2022-01-05 DIAGNOSIS — R748 Abnormal levels of other serum enzymes: Secondary | ICD-10-CM

## 2022-01-05 DIAGNOSIS — E119 Type 2 diabetes mellitus without complications: Secondary | ICD-10-CM

## 2022-01-05 DIAGNOSIS — R17 Unspecified jaundice: Secondary | ICD-10-CM

## 2022-01-05 NOTE — Patient Instructions (Addendum)
PT at [facility]: [phone]  [facility] [address] [place], [place] [address] [phone]

## 2022-01-05 NOTE — Assessment & Plan Note (Signed)
Patient still feeling much better overall. Continue current regimen for now. Goal fasting glucose <130. Labs today. She would like to see me again at the end of the month about 3-4 weeks prior to my maternity leave. If stable at that time we can space out appointments to at least 4-6 weeks. Education reinforced.  Regarding the occasional urinary leakage - she describes it more as urgency/incontinence if she waits too long to empty her bladder during the work day. No other symptoms. She declined leaving a urine sample today or referring to urology at this time. She is going to try to get a better break schedule with work so that she can see if emptying her bladder regularly will resolve the problem. Patient aware of signs/symptoms requiring further/urgent evaluation.

## 2022-01-05 NOTE — Assessment & Plan Note (Signed)
Mildly elevated today, but has been good at past several appointments. Reports it has been a busy day. She will start monitoring at home occasionally and bring a list of readings to next appointment. Patient denies any chest pain, palpitations, dyspnea, wheezing, edema, recurrent headaches, vision changes.

## 2022-01-05 NOTE — Telephone Encounter (Signed)
Gave order to pt so she can take it wherever she wants to get wheelchair from.

## 2022-01-05 NOTE — Progress Notes (Signed)
Established Patient Office Visit  Subjective   Patient ID: Alicia Pena, female    DOB: 08/26/1970  Age: 51 y.o. MRN: [id number]  CC: DM f/u    HPI Patient is here for another 2-week follow-up on diabetes. Her husband is here with her for visit.   DIABETES: Patient reports she is still doing really well. She is continuing to feel well and is excited to meet with nutritionist again next week.  - Checking BG at home: [place] = ranging 120-130s fasting for the past 2 weeks; daily averages for the past 2 weeks 114-141 (highest around 6pm) - Medications: Levemir 14 units nightly, metformin 1,000 mg BID, Jardiance 20 mg - Compliance: good  - Diet: low-carb - Exercise: minimal - Eye exam: since last visit - Foot exam: up-to-date - Denies symptoms of hypoglycemia, polyuria, polydipsia, numbness extremities, foot ulcers/trauma, wounds that are not healing, medication side effects   Reports that when she is at work, she does not always get enough breaks and her bladder will become so full that when she finally gets the chance to use the bathroom, she often can't make it in time. She denies any signs of infection (dysuria, hematuria, abd/flank/back pain, odor, confusion). States this does not happen on non-work days.        ROS All review of systems negative except what is listed in the HPI    Objective:     BP (!) 136/98   Pulse 88   Ht 5' (1.524 m)   Wt 179 lb 12.8 oz (81.6 kg)   BMI 35.11 kg/m    Physical Exam Vitals reviewed.  Constitutional:      Appearance: Normal appearance.  HENT:     Head: Normocephalic and atraumatic.  Skin:    General: Skin is warm and dry.  Neurological:     General: No focal deficit present.     Mental Status: She is alert and oriented to person, place, and time. Mental status is at baseline.  Psychiatric:        Mood and Affect: Mood normal.        Behavior: Behavior normal.        Thought Content: Thought content normal.         Judgment: Judgment normal.      No results found for any visits on 01/05/22.    The ASCVD Risk score (Arnett DK, et al., 2019) failed to calculate for the following reasons:   Cannot find a previous HDL lab   Cannot find a previous total cholesterol lab    Assessment & Plan:   Problem List Items Addressed This Visit       Cardiovascular and Mediastinum   Hypertension - Primary    Mildly elevated today, but has been good at past several appointments. Reports it has been a busy day. She will start monitoring at home occasionally and bring a list of readings to next appointment. Patient denies any chest pain, palpitations, dyspnea, wheezing, edema, recurrent headaches, vision changes.          Endocrine   Type 2 diabetes mellitus without complication, without long-term current use of insulin ([place])    Patient still feeling much better overall. Continue current regimen for now. Goal fasting glucose <130. Labs today. She would like to see me again at the end of the month about 3-4 weeks prior to my maternity leave. If stable at that time we can space out appointments to at least 4-6 weeks. Education reinforced.  Regarding the occasional urinary leakage - she describes it more as urgency/incontinence if she waits too long to empty her bladder during the work day. No other symptoms. She declined leaving a urine sample today or referring to urology at this time. She is going to try to get a better break schedule with work so that she can see if emptying her bladder regularly will resolve the problem. Patient aware of signs/symptoms requiring further/urgent evaluation.       Other Visit Diagnoses     Elevated bilirubin       Relevant Orders   Bilirubin, fractionated(tot/dir/indir)   Elevated liver enzymes       Relevant Orders   Bilirubin, fractionated(tot/dir/indir)        Return in about 3 weeks (around 01/26/2022) for dm f/u before [place] leave .    Terrilyn Saver, NP

## 2022-01-06 ENCOUNTER — Other Ambulatory Visit: Payer: Self-pay | Admitting: *Deleted

## 2022-01-06 ENCOUNTER — Other Ambulatory Visit (INDEPENDENT_AMBULATORY_CARE_PROVIDER_SITE_OTHER): Payer: Managed Care, Other (non HMO)

## 2022-01-06 DIAGNOSIS — R748 Abnormal levels of other serum enzymes: Secondary | ICD-10-CM | POA: Diagnosis not present

## 2022-01-06 LAB — COMPREHENSIVE METABOLIC PANEL
ALT: 63 U/L — ABNORMAL HIGH (ref 0–35)
AST: 55 U/L — ABNORMAL HIGH (ref 0–37)
Albumin: 4.4 g/dL (ref 3.5–5.2)
Alkaline Phosphatase: 57 U/L (ref 39–117)
BUN: 17 mg/dL (ref 6–23)
CO2: 27 mEq/L (ref 19–32)
Calcium: 9 mg/dL (ref 8.4–10.5)
Chloride: 106 mEq/L (ref 96–112)
Creatinine, Ser: 0.56 mg/dL (ref 0.40–1.20)
GFR: 104.85 mL/min (ref >60.00)
Glucose, Bld: 96 mg/dL (ref 70–99)
Potassium: 4 mEq/L (ref 3.5–5.1)
Sodium: 144 mEq/L (ref 135–145)
Total Bilirubin: 1.8 mg/dL — ABNORMAL HIGH (ref 0.2–1.2)
Total Protein: 7 g/dL (ref 6.0–8.3)

## 2022-01-06 LAB — LIPID PANEL
Cholesterol: 159 mg/dL (ref 0–200)
HDL: 30.3 mg/dL — ABNORMAL LOW (ref >39.00)
LDL Cholesterol: 97 mg/dL (ref 0–99)
NonHDL: 128.4
Total CHOL/HDL Ratio: 5
Triglycerides: 157 mg/dL — ABNORMAL HIGH (ref 0.0–149.0)
VLDL: 31.4 mg/dL (ref 0.0–40.0)

## 2022-01-06 LAB — CBC
HCT: 36 % (ref 36.0–46.0)
Hemoglobin: 11.9 g/dL — ABNORMAL LOW (ref 12.0–15.0)
MCHC: 33 g/dL (ref 30.0–36.0)
MCV: 92.5 fl (ref 78.0–100.0)
Platelets: 209 10*3/uL (ref 150.0–400.0)
RBC: 3.89 Mil/uL (ref 3.87–5.11)
RDW: 20.1 % — ABNORMAL HIGH (ref 11.5–15.5)
WBC: 3.4 10*3/uL — ABNORMAL LOW (ref 4.0–10.5)

## 2022-01-06 LAB — TSH: TSH: 0.89 u[IU]/mL (ref 0.35–5.50)

## 2022-01-06 LAB — BILIRUBIN, FRACTIONATED(TOT/DIR/INDIR)
Bilirubin, Direct: 0.3 mg/dL — ABNORMAL HIGH (ref 0.0–0.2)
Indirect Bilirubin: 1.3 mg/dL (calc) — ABNORMAL HIGH (ref 0.2–1.2)
Total Bilirubin: 1.6 mg/dL — ABNORMAL HIGH (ref 0.2–1.2)

## 2022-01-06 LAB — IBC PANEL
Iron: 85 ug/dL (ref 42–145)
Saturation Ratios: 18 % — ABNORMAL LOW (ref 20.0–50.0)
TIBC: 473.2 ug/dL — ABNORMAL HIGH (ref 250.0–450.0)
Transferrin: 338 mg/dL (ref 212.0–360.0)

## 2022-01-06 LAB — HEMOGLOBIN A1C: Hgb A1c MFr Bld: 8 % — ABNORMAL HIGH (ref 4.6–6.5)

## 2022-01-06 LAB — FERRITIN: Ferritin: 23.2 ng/mL (ref 10.0–291.0)

## 2022-01-06 NOTE — Progress Notes (Signed)
Add on labs ordered

## 2022-01-11 ENCOUNTER — Encounter: Payer: Managed Care, Other (non HMO) | Attending: Family Medicine | Admitting: Skilled Nursing Facility1

## 2022-01-11 ENCOUNTER — Encounter: Payer: Self-pay | Admitting: Skilled Nursing Facility1

## 2022-01-11 DIAGNOSIS — E119 Type 2 diabetes mellitus without complications: Secondary | ICD-10-CM | POA: Diagnosis not present

## 2022-01-11 DIAGNOSIS — G809 Cerebral palsy, unspecified: Secondary | ICD-10-CM | POA: Diagnosis not present

## 2022-01-11 DIAGNOSIS — Z713 Dietary counseling and surveillance: Secondary | ICD-10-CM | POA: Insufficient documentation

## 2022-01-11 DIAGNOSIS — C50919 Malignant neoplasm of unspecified site of unspecified female breast: Secondary | ICD-10-CM | POA: Diagnosis not present

## 2022-01-11 NOTE — Progress Notes (Signed)
Pt arrives in a wheel chair with her husband pushing her.  Pt states her breast cancer has metastasized to her hip. 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 Pt state she hates being dependant on people and has a lot of stairs in her home. Pt states she has tried in the past to have someone go through her home for her mobility issues to be able to move about her home but this did not come to anything useful she states. Pt states he has been in a rehab center but did not find it helpful.   Pt states she has completed her radiation and is in year 2 of cancer treatment.   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    Pt states she will not drink faucet water.  Pts husband is going to go to the pharmacy and learn how to do the insulin for his wife.   Pt states her libre fell off last night and cannot find it.   Pt was Able to recall the need for insulin.   Pts husband states he does not want EMS to knock down the door again so is hesitant to call 911 in a medical emergency for his wife.   Pt states she takes her pills with milk.    Pt arrives stating sh eis doing much better with her blood sugar control, reducing her sugars consumed, and sticking to a schedule.Pt states work is a little crazy from people leaving. Pt state she has also been routinely taking her medicine.  Pt state since her blood sugars are better controlled she is not urinating on herself fas often which apparently was happening a lot before but also states when she gets stressed she has more urinating on herself events.  Pt states the way her job schedule is going she doe snot have as much time to get to the restroom so she urinates on herself: dietitian asked if she could have her work accommodate her disability pt states she still does not have time. Pt states she does not wear a diaper for her urine issues stating her chair side commode at work next to her work so doe  snot want to pull down a diaper to use that. Pt states she also urinates in the bed while sleeping.  Pt states on the weekends her medicine and food schedule is not consistent. Pt states she is good with getting breakfast in but struggles with lunch and dinner. Pt state she did switch to 2% milk.  Pt states she gets frustrated with making meals and choosing foods stating her husband frustrates her with not having her foods available or working late.  Pt states she wants to write down what she eats and compare it to her glucose spikes.  Pt states she has lost weight stating she is 179 pounds.  Pt state she got an exercise chair so she can exercise in her chair and got free weights.   Pts A1C is down to 8.0 from 9.1  Pt states she patch a sticky patch for her CGM to keep it on so she is tracking her sugars now reporting about 150 down from 500.   Labs: WBC 3.6, RBC 3.95, Hemoglobin 11.9, MCHC 32.9, RDW 19.6, total bilirubin 1.7, AST 43, ALT 60    DM: Jardiance '10mg'$  Levemir 10 units Metformin  24 hr recall:  Breakfast: cheerios in 2% milk Snack: Lunch: Snack: Dinner:  baked potato Snack:  Beverages:   Goals/Conversation -15-30 grams carbohydrate per meal: NOT UNDER 15; 30 is good not bad -single digits of sugar on the nutrition facts label  -single digits of fat on the nutrition facts label -add nuts to your cereal or have 1 boiled egg on the side  -1 packet of oatmeal with nuts or the high fiber or high protein oatmeal ([place]odiak or quaker) -English muffin with peanut butter -choose whole wheat bread or whole wheat pasta  -be sure to have non starchy vegetables with lunch and dinner: low sodium canned vegetables are okay -choose brown rice over white rice -any sugar free drink is a fair option or hot tea; try sugar free juice  -lasagna: whole wheat pasta a square the size of your palm with a side salad -mashed potatoes: small pat of butter made with low fat milk  cup -limit  fruit to 2-3 times a day; half a banana is one serving  -create balanced meals for lunch and dinner

## 2022-01-12 NOTE — Telephone Encounter (Signed)
Alicia Pena from [facility] called back to [employer] know where to send wheelchair order.   Both places are in network and located in [place] with delivery options.   [facility]-[phone]  [id number].   She stated if [name] has no luck and needs more options to call [phone] ext. [phone].

## 2022-01-13 NOTE — Telephone Encounter (Signed)
Rx given to pt at last OV.

## 2022-01-25 ENCOUNTER — Other Ambulatory Visit: Payer: Self-pay | Admitting: Family Medicine

## 2022-01-25 DIAGNOSIS — E119 Type 2 diabetes mellitus without complications: Secondary | ICD-10-CM

## 2022-01-26 ENCOUNTER — Ambulatory Visit (INDEPENDENT_AMBULATORY_CARE_PROVIDER_SITE_OTHER): Payer: Managed Care, Other (non HMO) | Admitting: Family Medicine

## 2022-01-26 ENCOUNTER — Encounter: Payer: Self-pay | Admitting: Family Medicine

## 2022-01-26 VITALS — BP 124/69 | HR 81 | Ht 60.0 in | Wt 174.6 lb

## 2022-01-26 DIAGNOSIS — R32 Unspecified urinary incontinence: Secondary | ICD-10-CM

## 2022-01-26 DIAGNOSIS — E119 Type 2 diabetes mellitus without complications: Secondary | ICD-10-CM | POA: Diagnosis not present

## 2022-01-26 DIAGNOSIS — R17 Unspecified jaundice: Secondary | ICD-10-CM

## 2022-01-26 DIAGNOSIS — G809 Cerebral palsy, unspecified: Secondary | ICD-10-CM | POA: Diagnosis not present

## 2022-01-26 NOTE — Assessment & Plan Note (Signed)
A1c improving.  Continue current medications - Levemir, metformin, Jardiance UTD on vaccines, eye exam (will send [place] records), foot exam On ARB Discussed diet and exercise. Continue monitoring glucose. F/u in 3 months - A1c to be checked anytime after 04/07/22

## 2022-01-26 NOTE — Assessment & Plan Note (Signed)
Requesting new referral to PT for trying aquatic therapy  No new concerns

## 2022-01-26 NOTE — Progress Notes (Signed)
Established Patient Office Visit  Subjective   Patient ID: Alicia Pena, female    DOB: Dec 20, 1970  Age: 51 y.o. MRN: [id number]  CC: DM f/u   HPI Patient is here for DM follow-up. She has been coming in every 2-3 weeks for more focused diabetes control given her initial symptomatic presentation. She is here with her husband today.   DIABETES: Patient reports she is still doing really well. She is continuing to feel well and is excited to meet with nutritionist again next week.   - Checking BG at home: Libre = averaging 100-120  fasting for the past 2 weeks; daily averages for the past 2 weeks 94-114; no low events - Medications: Levemir 14 units nightly, metformin 1,000 mg BID, Jardiance 20 mg  - Compliance: good  - Diet: low-carb, down 5 pounds since last visit - trying hard to improve diet - Exercise: minimal - Eye exam: UTD, waiting for records - Foot exam: up-to-date - Denies symptoms of hypoglycemia, polyuria, polydipsia, numbness extremities, foot ulcers/trauma, wounds that are not healing, medication side effects    Lab Results  Component Value Date   HGBA1C 8.0 (H) 01/05/2022       ROS All review of systems negative except what is listed in the HPI    Objective:     BP 124/69   Pulse 81   Ht 5' (1.524 m)   Wt 174 lb 9.6 oz (79.2 kg)   BMI 34.10 kg/m    Physical Exam Vitals reviewed.  Constitutional:      Appearance: Normal appearance. She is obese.  Cardiovascular:     Rate and Rhythm: Normal rate and regular rhythm.  Pulmonary:     Effort: Pulmonary effort is normal.     Breath sounds: Normal breath sounds.  Neurological:     General: No focal deficit present.     Mental Status: She is alert and oriented to person, place, and time. Mental status is at baseline.  Psychiatric:        Mood and Affect: Mood normal.        Behavior: Behavior normal.        Thought Content: Thought content normal.        Judgment: Judgment normal.       No results found for any visits on 01/26/22.    The 10-year ASCVD risk score (Arnett DK, et al., 2019) is: 5.3%    Assessment & Plan:   Problem List Items Addressed This Visit       Endocrine   Type 2 diabetes mellitus without complication, without long-term current use of insulin (HCC)    A1c improving.  Continue current medications - Levemir, metformin, Jardiance UTD on vaccines, eye exam (will send [place] records), foot exam On ARB Discussed diet and exercise. Continue monitoring glucose. F/u in 3 months - A1c to be checked anytime after 04/07/22, recheck lipids and encourage statin        Nervous and Auditory   Cerebral palsy ([place]) - Primary    Requesting new referral to PT for trying aquatic therapy  No new concerns      Relevant Orders   Ambulatory referral to Physical Therapy   Other Visit Diagnoses     Elevated bilirubin     Bilirubin has been elevated, but started trending down 3 weeks ago. Asked to recheck today, but patient declines, stating she does not like lab draws and is going to oncology in a few weeks and they always  check CMP. She will let us know when their results are in so we can further order labs/imaging/referral if still high.     Urinary incontinence, unspecified type     Incontinent episodes have significantly improved since getting glucose under control. She is still having some episodes, even when not in stressful work situations (previous trigger). Will refer to urology to ensure nothing else is going on. No other GI/GU symptoms.    Relevant Orders   Ambulatory referral to Urology       Return in about 2 months (around 04/10/2022) for DM f/u, labs.    Terrilyn Saver, NP

## 2022-01-26 NOTE — Patient Instructions (Addendum)
Let us know after oncology checks your blood work - we were wanting to keep an eye on your liver function and bilirubin.   No other changes today. Keep up the good work!

## 2022-01-26 NOTE — Telephone Encounter (Signed)
Appt later today.

## 2022-03-07 ENCOUNTER — Ambulatory Visit: Payer: Managed Care, Other (non HMO)

## 2022-04-11 ENCOUNTER — Ambulatory Visit: Payer: Managed Care, Other (non HMO) | Admitting: Family

## 2022-05-14 ENCOUNTER — Other Ambulatory Visit: Payer: Self-pay | Admitting: Family Medicine

## 2022-05-14 DIAGNOSIS — E119 Type 2 diabetes mellitus without complications: Secondary | ICD-10-CM

## 2022-05-15 ENCOUNTER — Telehealth: Payer: Self-pay | Admitting: *Deleted

## 2022-05-15 NOTE — Telephone Encounter (Signed)
Looks like patient started yelling at nurse, but she did make an appointment for tomorrow to be seen.

## 2022-05-15 NOTE — Telephone Encounter (Signed)
Who Is Calling Patient / Member / Family / Caregiver Call Type Triage / Clinical Relationship To Patient Self Return Phone Number [phone] (Primary) Chief Complaint Vomiting Reason for Call Symptomatic / Request for Health Information Initial Comment Caller wanting to schedule an appt, caller states her blood sugar is high at 130 but it was higher at 156 and she's been vomiting this morning Translation No Nurse Assessment Nurse: Redmond Pulling, RN, Levada Dy Date/Time Eilene Ghazi Time): 05/15/2022 8:12:09 AM Confirm and document reason for call. If symptomatic, describe symptoms. ---Caller wanting to schedule an appointment, caller states her blood sugar is high at 130 but it was higher at 156 and she's been vomiting this morning. No fever.  Final Disposition 05/15/2022 8:21:03 AM Go to ED Now (or PCP triage) Yes Redmond Pulling, RN, Levada Dy  User: Ivonne Andrew, RN Date/Time Eilene Ghazi Time): 05/15/2022 8:20:53 AM Patient began yelling after she was told the outcome disposition of Go to ER now to be seen in an hour, stating she only called for an appointment. Referrals GO TO FACILITY REFUSE

## 2022-05-16 ENCOUNTER — Encounter: Payer: Self-pay | Admitting: Family Medicine

## 2022-05-16 ENCOUNTER — Ambulatory Visit (INDEPENDENT_AMBULATORY_CARE_PROVIDER_SITE_OTHER): Payer: Managed Care, Other (non HMO) | Admitting: Family Medicine

## 2022-05-16 VITALS — BP 142/94 | HR 105 | Temp 98.3°F

## 2022-05-16 DIAGNOSIS — E119 Type 2 diabetes mellitus without complications: Secondary | ICD-10-CM | POA: Diagnosis not present

## 2022-05-16 DIAGNOSIS — R32 Unspecified urinary incontinence: Secondary | ICD-10-CM | POA: Diagnosis not present

## 2022-05-16 DIAGNOSIS — I10 Essential (primary) hypertension: Secondary | ICD-10-CM

## 2022-05-16 DIAGNOSIS — R5383 Other fatigue: Secondary | ICD-10-CM

## 2022-05-16 DIAGNOSIS — R112 Nausea with vomiting, unspecified: Secondary | ICD-10-CM

## 2022-05-16 LAB — POC URINALSYSI DIPSTICK (AUTOMATED)
Blood, UA: NEGATIVE
Glucose, UA: NEGATIVE
Nitrite, UA: NEGATIVE
Protein, UA: NEGATIVE
Spec Grav, UA: 1.025 (ref 1.010–1.025)
Urobilinogen, UA: 1 E.U./dL
pH, UA: 6 (ref 5.0–8.0)

## 2022-05-16 MED ORDER — LANTUS SOLOSTAR 100 UNIT/ML ~~LOC~~ SOPN: PEN_INJECTOR | SUBCUTANEOUS | 99 refills | Status: DC

## 2022-05-16 NOTE — Assessment & Plan Note (Signed)
Check ua

## 2022-05-16 NOTE — Assessment & Plan Note (Signed)
Check labs 

## 2022-05-16 NOTE — Patient Instructions (Signed)
Nausea and Vomiting, Adult Nausea is the feeling that you have an upset stomach or that you are about to vomit. As nausea gets worse, it can lead to vomiting. Vomiting is when stomach contents forcefully come out of your mouth as a result of nausea. Vomiting can make you feel weak and cause you to become dehydrated. Dehydration can make you feel tired and thirsty, cause you to have a dry mouth, and decrease how often you urinate. Older adults and people with other diseases or a weak disease-fighting system (immune system) are at higher risk for dehydration. It is important to treat your nausea and vomiting as told by your health care provider. Follow these instructions at home: Watch your symptoms for any changes. Tell your health care provider about them. Eating and drinking     Take an oral rehydration solution (ORS). This is a drink that is sold at pharmacies and retail stores. Drink clear fluids slowly and in small amounts as you are able. Clear fluids include water, ice chips, low-calorie sports drinks, and fruit juice that has water added (diluted fruit juice). Eat bland, easy-to-digest foods in small amounts as you are able. These foods include bananas, applesauce, rice, lean meats, toast, and crackers. Avoid fluids that contain a lot of sugar or caffeine, such as energy drinks, sports drinks, and soda. Avoid alcohol. Avoid spicy or fatty foods. General instructions Take over-the-counter and prescription medicines only as told by your health care provider. Drink enough fluid to keep your urine pale yellow. Wash your hands often using soap and water for at least 20 seconds. If soap and water are not available, use hand sanitizer. Make sure that everyone in your household washes their hands well and often. Rest at home while you recover. Watch your condition for any changes. Take slow and deep breaths when you feel nauseous. Keep all follow-up visits. This is important. Contact a health  care provider if: Your symptoms get worse. You have new symptoms. You have a fever. You cannot drink fluids without vomiting. Your nausea does not go away after 2 days. You feel light-headed or dizzy. You have a headache. You have muscle cramps. You have a rash. You have pain while urinating. Get help right away if: You have pain in your chest, neck, arm, or jaw. You feel extremely weak or you faint. You have persistent vomiting. You have vomit that is bright red or looks like black coffee grounds. You have bloody or black stools (feces) or stools that look like tar. You have a severe headache, a stiff neck, or both. You have severe pain, cramping, or bloating in your abdomen. You have difficulty breathing, or you are breathing very quickly. Your heart is beating very quickly. Your skin feels cold and clammy. You feel confused. You have signs of dehydration, such as: Dark urine, very little urine, or no urine. Cracked lips. Dry mouth. Sunken eyes. Sleepiness. Weakness. These symptoms may be an emergency. Get help right away. Call 911. Do not wait to see if the symptoms will go away. Do not drive yourself to the hospital. Summary Nausea is the feeling that you have an upset stomach or that you are about to vomit. As nausea gets worse, it can lead to vomiting. Vomiting can make you feel weak and cause you to become dehydrated. Follow instructions from your health care provider about eating and drinking to prevent dehydration. Take over-the-counter and prescription medicines only as told by your health care provider. Contact your health care   provider if your symptoms get worse, or you have new symptoms. Keep all follow-up visits. This is important. This information is not intended to replace advice given to you by your health care provider. Make sure you discuss any questions you have with your health care provider. Document Revised: 12/24/2020 Document Reviewed:  12/24/2020 Elsevier Patient Education  2023 Elsevier Inc.  

## 2022-05-16 NOTE — Assessment & Plan Note (Signed)
No N/v today Brat diet ---  drink fluids If vomiting occurs again go to ER  F/u pcp

## 2022-05-16 NOTE — Progress Notes (Signed)
Subjective:   By signing my name below, I, Shehryar Baig, attest that this documentation has been prepared under the direction and in the presence of Ann Held, DO. 05/16/2022     Patient ID: Alicia Pena, female    DOB: 28-Aug-1970, 51 y.o.   MRN: [id number]  Chief Complaint  Patient presents with   Emesis    In the morning since yesterday.      Nausea    At at 4 pm today.    left arm swelling for 2 days     Emesis  Pertinent negatives include no chest pain, coughing, diarrhea, fever or headaches.   Patient is in today for a office visit.   She complains of nausea, vomiting, and high blood sugar. She vomited yesterday morning and had no episode since. She had an episode of nausea at 2 pm this afternoon. . She denies having any fever or diarrhea.  She reports losing control of her bladder and urinating on her self on Saturday and Sunday. She is taking 12 ml of levemir daily to manage her blood sugar but her ins will stop paying for this Jan 1.   The will pay for lantus.   Lab Results  Component Value Date   HGBA1C 8.0 (H) 01/05/2022   She also complains of left arm swelling for the past 2 days.   Past Medical History:  Diagnosis Date   Allergy    Anemia    Anxiety    does not like needles .Marland Kitchen    Arthritis    Breast cancer ([place]) 03/21/2012   left multifocal invasive, in situ ca,margins not involved,ER/PR=+,metastatic (1/1) node   Cerebral palsy ([place])    from birth   Clotting disorder ([place])    Complication of anesthesia    Depression    History of radiation therapy 10/08/2012-11/21/2012   50.4 gray to left chest wall and supraclavicular region   Personal history of chemotherapy    Personal history of radiation therapy     Past Surgical History:  Procedure Laterality Date   ABDOMINAL HYSTERECTOMY  2006   w/o oophorectomy   AXILLARY LYMPH NODE DISSECTION  04/04/2012   Procedure: AXILLARY LYMPH NODE DISSECTION;  Surgeon: Rolm Bookbinder, MD;   Location: [facility];  Service: General;  Laterality: Left;  Left Axillary lymph node dissection, Port a cath placement   BREAST BIOPSY  89/07/2011   left breast uoq bx=invasive ductal ca   breast biopsy bi lateral  02/15/12   left = invasive ductal, right breast= fibroadenoma,fibrocystic changes,no atypia,hyperlasia or malignancy identified   EYE SURGERY     as a child   LEG SURGERY     LLE   MASTECTOMY COMPLETE / [place] BIOPSY Left 03/21/2012   left Dr.Matthew Donne Hazel   PORTACATH PLACEMENT  04/04/2012   Procedure: INSERTION PORT-A-CATH;  Surgeon: Rolm Bookbinder, MD;  Location: [facility];  Service: General;  Laterality: Right;    Family History  Problem Relation Age of Onset   Learning disabilities Mother    Hypertension Mother    Depression Mother    Cancer Father 67       lung   Depression Sister    Diabetes Maternal Grandmother    Hyperlipidemia Maternal Grandmother    Hypertension Maternal Grandmother     Social History   Socioeconomic History   Marital status: Married    Spouse name: Not on file   Number of children: Not on  file   Years of education: Not on file   Highest education level: Not on file  Occupational History   Not on file  Tobacco Use   Smoking status: Never   Smokeless tobacco: Never  Vaping Use   Vaping Use: Never used  Substance and Sexual Activity   Alcohol use: Never    Comment: used birth control pills 6 months,G)PO   Drug use: Never   Sexual activity: Yes  Other Topics Concern   Not on file  Social History Narrative   Not on file   Social Determinants of Health   Financial Resource Strain: Not on file  Food Insecurity: Not on file  Transportation Needs: Not on file  Physical Activity: Not on file  Stress: Not on file  Social Connections: Not on file  Intimate Partner Violence: Not on file    Outpatient Medications Prior to Visit  Medication Sig Dispense Refill   anastrozole  (ARIMIDEX) 1 MG tablet Take 1 mg by mouth daily.     Continuous Blood Gluc Sensor (FREESTYLE LIBRE 3 SENSOR) MISC APPLY 1 SENSOR TO THE SKIN EVERY 14 DAYS, USE TO CHECK BLOOD GLUCOSE CONTINUOUSLY 2 each 5   empagliflozin (JARDIANCE) 10 MG TABS tablet Take 1 tablet (10 mg total) by mouth daily before breakfast. 90 tablet 1   IBRANCE 75 MG tablet Take by mouth.     Insulin Pen Needle (PEN NEEDLES) 32G X 4 MM MISC 1 each by Does not apply route daily. 100 each 4   losartan (COZAAR) 25 MG tablet Take 1 tablet (25 mg total) by mouth daily. 90 tablet 1   metFORMIN (GLUCOPHAGE) 1000 MG tablet Take 1 tablet (1,000 mg total) by mouth 2 (two) times daily with a meal. 60 tablet 3   ondansetron (ZOFRAN) 8 MG tablet Take by mouth.     XARELTO 20 MG TABS tablet Take 20 mg by mouth daily.     insulin detemir (LEVEMIR FLEXTOUCH) 100 UNIT/ML FlexPen Inject 10 Units into the skin at bedtime. Start with 10 units every night. Titrate up by 2 units every 3 days if morning/fasting glucose is higher than 130 consistently. 15 mL 11   No facility-administered medications prior to visit.    Allergies  Allergen Reactions   Duricef [Cefadroxil] Hives and Nausea Only    Review of Systems  Constitutional:  Negative for fever.  HENT:  Negative for congestion.   Eyes:  Negative for blurred vision.  Respiratory:  Negative for cough.   Cardiovascular:  Negative for chest pain and palpitations.  Gastrointestinal:  Positive for nausea and vomiting. Negative for diarrhea.  Genitourinary:  Positive for frequency.  Musculoskeletal:  Negative for back pain.       (+)swelling in left arm  Skin:  Negative for rash.  Neurological:  Negative for loss of consciousness and headaches.       Objective:    Physical Exam Vitals and nursing note reviewed.  Constitutional:      General: She is not in acute distress.    Appearance: Normal appearance. She is not ill-appearing.  HENT:     Head: Normocephalic and atraumatic.      Right Ear: External ear normal.     Left Ear: External ear normal.  Eyes:     Extraocular Movements: Extraocular movements intact.     Pupils: Pupils are equal, round, and reactive to light.  Cardiovascular:     Rate and Rhythm: Normal rate and regular rhythm.     Heart  sounds: Normal heart sounds. No murmur heard.    No gallop.  Pulmonary:     Effort: Pulmonary effort is normal. No respiratory distress.     Breath sounds: Normal breath sounds. No wheezing or rales.  Skin:    General: Skin is warm and dry.  Neurological:     Mental Status: She is alert. Mental status is at baseline.  Psychiatric:        Attention and Perception: Attention normal.        Speech: Speech is delayed and slurred.     Comments: Pt with hx CP     BP (!) 142/94   Pulse (!) 105   Temp 98.3 F (36.8 C) (Oral)   SpO2 98%  Wt Readings from Last 3 Encounters:  01/26/22 174 lb 9.6 oz (79.2 kg)  01/05/22 179 lb 12.8 oz (81.6 kg)  12/22/21 182 lb 9.6 oz (82.8 kg)    Diabetic Foot Exam - Simple   No data filed    Lab Results  Component Value Date   WBC 3.4 (L) 01/05/2022   HGB 11.9 (L) 01/05/2022   HCT 36.0 01/05/2022   PLT 209.0 01/05/2022   GLUCOSE 96 01/05/2022   CHOL 159 01/05/2022   TRIG 157.0 (H) 01/05/2022   HDL 30.30 (L) 01/05/2022   LDLCALC 97 01/05/2022   ALT 63 (H) 01/05/2022   AST 55 (H) 01/05/2022   NA 144 01/05/2022   K 4.0 01/05/2022   CL 106 01/05/2022   CREATININE 0.56 01/05/2022   BUN 17 01/05/2022   CO2 27 01/05/2022   TSH 0.89 01/05/2022   HGBA1C 8.0 (H) 01/05/2022    Lab Results  Component Value Date   TSH 0.89 01/05/2022   Lab Results  Component Value Date   WBC 3.4 (L) 01/05/2022   HGB 11.9 (L) 01/05/2022   HCT 36.0 01/05/2022   MCV 92.5 01/05/2022   PLT 209.0 01/05/2022   Lab Results  Component Value Date   NA 144 01/05/2022   K 4.0 01/05/2022   CHLORIDE 111 (H) 11/12/2013   CO2 27 01/05/2022   GLUCOSE 96 01/05/2022   BUN 17 01/05/2022    CREATININE 0.56 01/05/2022   BILITOT 1.6 (H) 01/05/2022   ALKPHOS 57 01/05/2022   AST 55 (H) 01/05/2022   ALT 63 (H) 01/05/2022   PROT 7.0 01/05/2022   ALBUMIN 4.4 01/05/2022   CALCIUM 9.0 01/05/2022   ANIONGAP 16 (H) 11/21/2021   GFR 104.85 01/05/2022   Lab Results  Component Value Date   CHOL 159 01/05/2022   Lab Results  Component Value Date   HDL 30.30 (L) 01/05/2022   Lab Results  Component Value Date   LDLCALC 97 01/05/2022   Lab Results  Component Value Date   TRIG 157.0 (H) 01/05/2022   Lab Results  Component Value Date   CHOLHDL 5 01/05/2022   Lab Results  Component Value Date   HGBA1C 8.0 (H) 01/05/2022       Assessment & Plan:   Problem List Items Addressed This Visit       Unprioritized   Urinary incontinence    Check ua       Relevant Orders   CBC with Differential/Platelet   Comprehensive metabolic panel   POCT Urinalysis Dipstick (Automated) (Completed)   Urine Culture   Type 2 diabetes mellitus without complication, without long-term current use of insulin ([place]) - Primary    Blood sugars running 125-130 today Was running high yesterday per pt  Check  labs today and ua       Relevant Medications   insulin glargine (LANTUS SOLOSTAR) 100 UNIT/ML Solostar Pen   Other Relevant Orders   CBC with Differential/Platelet   Comprehensive metabolic panel   POCT Urinalysis Dipstick (Automated) (Completed)   Hemoglobin A1c   Other fatigue    Check labs       Relevant Orders   TSH   Nausea and vomiting    No N/v today Brat diet ---  drink fluids If vomiting occurs again go to ER  F/u pcp       Hypertension   Relevant Orders   CBC with Differential/Platelet   Comprehensive metabolic panel   POCT Urinalysis Dipstick (Automated) (Completed)     Meds ordered this encounter  Medications   insulin glargine (LANTUS SOLOSTAR) 100 UNIT/ML Solostar Pen    Sig: 12 u sq qd    Dispense:  15 mL    Refill:  PRN    Levemir no longer covered  as of Jan 1 , 2024    I, Ann Held, DO, personally preformed the services described in this documentation.  All medical record entries made by the scribe were at my direction and in my presence.  I have reviewed the chart and discharge instructions (if applicable) and agree that the record reflects my personal performance and is accurate and complete. 05/16/2022   I,Shehryar Baig,acting as a scribe for Ann Held, DO.,have documented all relevant documentation on the behalf of Ann Held, DO,as directed by  Ann Held, DO while in the presence of Ann Held, DO.   Ann Held, DO

## 2022-05-16 NOTE — Assessment & Plan Note (Signed)
Blood sugars running 125-130 today Was running high yesterday per pt  Check labs today and ua

## 2022-05-17 LAB — COMPREHENSIVE METABOLIC PANEL
ALT: 39 U/L — ABNORMAL HIGH (ref 0–35)
AST: 32 U/L (ref 0–37)
Albumin: 3.9 g/dL (ref 3.5–5.2)
Alkaline Phosphatase: 60 U/L (ref 39–117)
BUN: 15 mg/dL (ref 6–23)
CO2: 27 mEq/L (ref 19–32)
Calcium: 8.4 mg/dL (ref 8.4–10.5)
Chloride: 108 mEq/L (ref 96–112)
Creatinine, Ser: 0.92 mg/dL (ref 0.40–1.20)
GFR: 71.4 mL/min (ref >60.00)
Glucose, Bld: 96 mg/dL (ref 70–99)
Potassium: 3.9 mEq/L (ref 3.5–5.1)
Sodium: 141 mEq/L (ref 135–145)
Total Bilirubin: 1.3 mg/dL — ABNORMAL HIGH (ref 0.2–1.2)
Total Protein: 7 g/dL (ref 6.0–8.3)

## 2022-05-17 LAB — CBC WITH DIFFERENTIAL/PLATELET
Basophils Absolute: 0 10*3/uL (ref 0.0–0.1)
Basophils Relative: 1.1 % (ref 0.0–3.0)
Eosinophils Absolute: 0.1 10*3/uL (ref 0.0–0.7)
Eosinophils Relative: 2.2 % (ref 0.0–5.0)
HCT: 35.2 % — ABNORMAL LOW (ref 36.0–46.0)
Hemoglobin: 11.8 g/dL — ABNORMAL LOW (ref 12.0–15.0)
Lymphocytes Relative: 24.8 % (ref 12.0–46.0)
Lymphs Abs: 1.1 10*3/uL (ref 0.7–4.0)
MCHC: 33.6 g/dL (ref 30.0–36.0)
MCV: 90.3 fl (ref 78.0–100.0)
Monocytes Absolute: 0.4 10*3/uL (ref 0.1–1.0)
Monocytes Relative: 8.9 % (ref 3.0–12.0)
Neutro Abs: 2.7 10*3/uL (ref 1.4–7.7)
Neutrophils Relative %: 63 % (ref 43.0–77.0)
Platelets: 289 10*3/uL (ref 150.0–400.0)
RBC: 3.9 Mil/uL (ref 3.87–5.11)
RDW: 20.2 % — ABNORMAL HIGH (ref 11.5–15.5)
WBC: 4.3 10*3/uL (ref 4.0–10.5)

## 2022-05-17 LAB — TSH: TSH: 0.98 u[IU]/mL (ref 0.35–5.50)

## 2022-05-17 LAB — URINE CULTURE
MICRO NUMBER:: 14186804
SPECIMEN QUALITY:: ADEQUATE

## 2022-05-17 LAB — HEMOGLOBIN A1C: Hgb A1c MFr Bld: 5.5 % (ref 4.6–6.5)

## 2022-05-19 ENCOUNTER — Telehealth: Payer: Self-pay | Admitting: Family Medicine

## 2022-05-19 NOTE — Telephone Encounter (Signed)
Pt notified of results and provider recommendations.

## 2022-05-19 NOTE — Telephone Encounter (Signed)
Pt called stating that she has been calling for the past couple of days trying to get results for her Labs. Lab results were posted on 11.15.23 and no provider advisement has been noted. Advised pt that a note would be sent back to look into this as high priority and have her Lovena Le look into this per discussion with [place]. Pt acknowledged understanding.

## 2022-05-22 ENCOUNTER — Encounter: Payer: Self-pay | Admitting: Family Medicine

## 2022-05-22 ENCOUNTER — Telehealth: Payer: Self-pay | Admitting: Family Medicine

## 2022-05-22 NOTE — Telephone Encounter (Signed)
Patient states she needs a note for her job stating she is ready to go back to work. She would like for it to be emailed to her at:  [email or web address].  Please advise.

## 2022-06-02 ENCOUNTER — Other Ambulatory Visit: Payer: Self-pay | Admitting: Family Medicine

## 2022-06-02 DIAGNOSIS — I10 Essential (primary) hypertension: Secondary | ICD-10-CM

## 2022-06-02 DIAGNOSIS — E119 Type 2 diabetes mellitus without complications: Secondary | ICD-10-CM

## 2022-06-21 ENCOUNTER — Ambulatory Visit (INDEPENDENT_AMBULATORY_CARE_PROVIDER_SITE_OTHER): Payer: Managed Care, Other (non HMO) | Admitting: Family Medicine

## 2022-06-21 ENCOUNTER — Encounter: Payer: Self-pay | Admitting: Family Medicine

## 2022-06-21 VITALS — BP 122/94 | HR 86 | Temp 98.0°F | Resp 16 | Ht 60.0 in | Wt 172.8 lb

## 2022-06-21 DIAGNOSIS — L308 Other specified dermatitis: Secondary | ICD-10-CM

## 2022-06-21 DIAGNOSIS — R32 Unspecified urinary incontinence: Secondary | ICD-10-CM | POA: Diagnosis not present

## 2022-06-21 DIAGNOSIS — R17 Unspecified jaundice: Secondary | ICD-10-CM

## 2022-06-21 DIAGNOSIS — E119 Type 2 diabetes mellitus without complications: Secondary | ICD-10-CM

## 2022-06-21 MED ORDER — GERHARDT'S BUTT CREAM: 1.0000 | TOPICAL_CREAM | Freq: Two times a day (BID) | CUTANEOUS | 2 refills | Status: DC

## 2022-06-21 NOTE — Progress Notes (Signed)
Established Patient Office Visit  Subjective   Patient ID: Alicia Pena, female    DOB: 05-Feb-1971  Age: 51 y.o. MRN: [id number]  CC: DM f/u   HPI Patient is here for DM follow-up. Her last follow-up with me was 01/26/22 for diabetes management. She saw my colleague Dr. Carollee Herter on 05/16/22 for nausea, vomiting, and left arm swelling. Symptom management was encouraged and routine labs were updated. Bilirubin  and ALT (previously elevated) were trending down and A1c much improved.     DIABETES: Patient reports she is still doing well. She was able to have her initial consult with endocrinology on 06/09/22.   - Checking BG at home: yes, Elenor Legato - Medications: Levemir 12 units nightly (endo recently discontinued metformin and Jardiance as A1c is well controlled and she was frequently missing doses and having GI/GU side effects) - will likely have to switch to Lantus after 07/03/2022 due to insurance coverage. - Compliance: good  - Diet: low-carb - Exercise: minimal - Denies symptoms of hypoglycemia, polyuria, polydipsia, numbness extremities, foot ulcers/trauma, wounds that are not healing, medication side effects  - She was given Baqsimi by endocrinology and reports that she didn't understand the direction and had been trying to use it daily.   Lab Results  Component Value Date   HGBA1C 5.5 05/16/2022    She continues to struggle with urinary incontinence despite glucose levels being well controlled now. States she never heard about previous referral and would like [place] to place a new one. Reports the skin around her vulva and groin is very itching and irritated. States she is frequently having incontinent episodes and having to wear diapers. She denies any vaginal symptoms or dysuria.     ROS All review of systems negative except what is listed in the HPI    Objective:     BP (!) 122/94   Pulse 86   Temp 98 F (36.7 C)   Resp 16   Ht 5' (1.524 m)   Wt 172 lb 12.8  oz (78.4 kg)   SpO2 98%   BMI 33.75 kg/m    Physical Exam Vitals reviewed.  Constitutional:      Appearance: Normal appearance. She is obese.  Cardiovascular:     Rate and Rhythm: Normal rate and regular rhythm.  Pulmonary:     Effort: Pulmonary effort is normal.     Breath sounds: Normal breath sounds.  Skin:    Comments: Pubic area and right groin with excoriation and dryness. No significant erythema or rashes noted. No inflammation or abscesses.   Neurological:     General: No focal deficit present.     Mental Status: She is alert and oriented to person, place, and time. Mental status is at baseline.  Psychiatric:        Mood and Affect: Mood normal.        Behavior: Behavior normal.        Thought Content: Thought content normal.        Judgment: Judgment normal.      No results found for any visits on 06/21/22.    The 10-year ASCVD risk score (Arnett DK, et al., 2019) is: 5.1%    Assessment & Plan:   Problem List Items Addressed This Visit       Endocrine   Type 2 diabetes mellitus without complication, without long-term current use of insulin (HCC)    A1c improving.  Continue current medications - Levemir 12 units daily (likely change to  Lantus after the new year due to insurance) Educated on Baqsimi use On ARB Discussed diet and exercise. Continue monitoring glucose. F/u in 3 months       Relevant Medications   BAQSIMI TWO PACK 3 MG/DOSE POWD   Other Relevant Orders   Comp Met (CMET)   CBC     Other   Urinary incontinence - Primary    Referral to urology to evaluate and manage        Relevant Medications   Nystatin (GERHARDT'S BUTT CREAM) CREA   Other Relevant Orders   Ambulatory referral to Urology   Other Visit Diagnoses     Dermatitis associated with moisture     - Recommend barrier cream use after bathing, using the bathroom, etc - Keep skin clean and dry - Do not sit in urine for extended periods of time - If area starts looking  like a yeast infection (beefy red appearance, very itchy), you can use the nystatin cream twice a day until improving    Relevant Medications   Nystatin (GERHARDT'S BUTT CREAM) CREA    Elevated bilirubin     Updating labs today    Relevant Orders   Comp Met (CMET)   Bilirubin, fractionated(tot/dir/indir)         Return in about 3 months (around 09/20/2022) for routine follow-up.    Terrilyn Saver, NP

## 2022-06-21 NOTE — Assessment & Plan Note (Signed)
A1c improving.  Continue current medications - Levemir 12 units daily (likely change to Lantus after the new year due to insurance) Educated on Baqsimi use On ARB Discussed diet and exercise. Continue monitoring glucose. F/u in 3 months

## 2022-06-21 NOTE — Patient Instructions (Addendum)
For your skin: - Recommend barrier cream use after bathing, using the bathroom, etc - Keep skin clean and dry - Do not sit in urine for extended periods of time - If area starts looking like a yeast infection (beefy red appearance, very itchy), you can use the nystatin cream twice a day until improving  Sending in a new referral to urology to evaluate your incontinence  Continue following with endocrinology for diabetes Baqsimi is only for emergency use if blood sugar is very low Continue your insulin as ordered and blood sugar monitoring with Libre device  Updating a few labs today

## 2022-06-21 NOTE — Assessment & Plan Note (Signed)
Referral to urology to evaluate and manage

## 2022-06-22 LAB — COMPREHENSIVE METABOLIC PANEL
ALT: 36 U/L — ABNORMAL HIGH (ref 0–35)
AST: 28 U/L (ref 0–37)
Albumin: 3.9 g/dL (ref 3.5–5.2)
Alkaline Phosphatase: 59 U/L (ref 39–117)
BUN: 17 mg/dL (ref 6–23)
CO2: 28 mEq/L (ref 19–32)
Calcium: 9.4 mg/dL (ref 8.4–10.5)
Chloride: 108 mEq/L (ref 96–112)
Creatinine, Ser: 0.6 mg/dL (ref 0.40–1.20)
GFR: 102.79 mL/min (ref >60.00)
Glucose, Bld: 124 mg/dL — ABNORMAL HIGH (ref 70–99)
Potassium: 4.2 mEq/L (ref 3.5–5.1)
Sodium: 143 mEq/L (ref 135–145)
Total Bilirubin: 0.9 mg/dL (ref 0.2–1.2)
Total Protein: 6.8 g/dL (ref 6.0–8.3)

## 2022-06-22 LAB — CBC
HCT: 34.2 % — ABNORMAL LOW (ref 36.0–46.0)
Hemoglobin: 11.6 g/dL — ABNORMAL LOW (ref 12.0–15.0)
MCHC: 33.9 g/dL (ref 30.0–36.0)
MCV: 90.7 fl (ref 78.0–100.0)
Platelets: 366 10*3/uL (ref 150.0–400.0)
RBC: 3.77 Mil/uL — ABNORMAL LOW (ref 3.87–5.11)
RDW: 21.1 % — ABNORMAL HIGH (ref 11.5–15.5)
WBC: 5.9 10*3/uL (ref 4.0–10.5)

## 2022-06-22 LAB — BILIRUBIN, FRACTIONATED(TOT/DIR/INDIR)
Bilirubin, Direct: 0.2 mg/dL (ref 0.0–0.2)
Indirect Bilirubin: 0.6 mg/dL (calc) (ref 0.2–1.2)
Total Bilirubin: 0.8 mg/dL (ref 0.2–1.2)

## 2022-06-30 ENCOUNTER — Ambulatory Visit: Payer: Managed Care, Other (non HMO) | Admitting: Urology

## 2022-06-30 ENCOUNTER — Encounter: Payer: Self-pay | Admitting: Urology

## 2022-06-30 DIAGNOSIS — N3941 Urge incontinence: Secondary | ICD-10-CM

## 2022-06-30 LAB — BLADDER SCAN AMB NON-IMAGING

## 2022-06-30 LAB — URINALYSIS
Bilirubin, UA: NEGATIVE
Blood, UA: NEGATIVE
Glucose, UA: NEGATIVE mg/dL
Ketones, UA: NEGATIVE
Nitrite, UA: NEGATIVE
Protein, UA: POSITIVE — AB
Spec Grav, UA: >=1.03 — AB (ref 1.010–1.025)
Urobilinogen, UA: 0.2 E.U./dL
pH, UA: 5.5 (ref 5.0–8.0)

## 2022-06-30 MED ORDER — MIRABEGRON ER 50 MG PO TB24: 50.0000 mg | ORAL_TABLET | Freq: Every day | ORAL | 0 refills | Status: DC

## 2022-06-30 NOTE — Progress Notes (Signed)
Assessment: 1. Urge incontinence     Plan: I reviewed the patient's chart including provider notes and lab results. Diagnosis and management of overactive bladder and urge incontinence discussed with the patient.  Options for management including avoidance of dietary irritants, behavioral modification, medical therapy, neuromodulation, and chemodenervation discussed.  Patient information provided. Bladder diet sheet given. Trial of Myrbetriq 50 mg daily.  Samples provided.  Use and side effects discussed. Return to office in 1 month.  Chief Complaint:  Chief Complaint  Patient presents with   Urinary Incontinence    History of Present Illness:  Alicia Pena is a 51 y.o. female who is seen in consultation from Terrilyn Saver, NP for evaluation of urinary incontinence.  She has noted incontinence symptoms for the past year.  Her symptoms have worsened since she was diagnosed with diabetes in March 2023.  She has symptoms of urinary frequency, urgency, nocturia 2-3 times, and urge incontinence.  She has large volume incontinence requiring pad use throughout the day.  She has had irritation of the vaginal area associated with her pad use.  She reports some occasional dysuria.  No problems emptying her bladder.  No gross hematuria or recent UTIs.  Her urinary symptoms and incontinence are more noticeable when she is working at home.  She does have occasional stress incontinence.  She has had some recent issues with diarrhea and had some fecal incontinence.  This is not a longstanding problem for her however.  No significant problems with constipation.  No medical therapy to date.  She has a history of cerebral palsy, diabetes, and breast cancer on Arimidex.   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   Clotting disorder ([place])    Complication of anesthesia    Depression    History of radiation therapy 10/08/2012-11/21/2012   50.4 gray to left chest wall and supraclavicular region   Personal history of chemotherapy    Personal history of radiation therapy     Past Surgical History:  Past Surgical History:  Procedure Laterality Date   ABDOMINAL HYSTERECTOMY  2006   w/o oophorectomy   AXILLARY LYMPH NODE DISSECTION  04/04/2012   Procedure: AXILLARY LYMPH NODE DISSECTION;  Surgeon: Rolm Bookbinder, MD;  Location: [facility];  Service: General;  Laterality: Left;  Left Axillary lymph node dissection, Port a cath placement   BREAST BIOPSY  89/07/2011   left breast uoq bx=invasive ductal ca   breast biopsy bi lateral  02/15/12   left = invasive ductal, right breast= fibroadenoma,fibrocystic changes,no atypia,hyperlasia or malignancy identified   EYE SURGERY     as a child   LEG SURGERY     LLE   MASTECTOMY COMPLETE / SIMPLE W/ SENTINEL NODE BIOPSY Left 03/21/2012   left Dr.Matthew Donne Hazel   PORTACATH PLACEMENT  04/04/2012   Procedure: INSERTION PORT-A-CATH;  Surgeon: Rolm Bookbinder, MD;  Location: [facility];  Service: General;  Laterality: Right;    Allergies:  Allergies  Allergen Reactions   Duricef [Cefadroxil] Hives and Nausea Only    Family History:  Family History  Problem Relation Age of Onset   Learning disabilities Mother  Hypertension Mother    Depression Mother    Cancer Father 46       lung   Depression Sister    Diabetes Maternal Grandmother    Hyperlipidemia Maternal Grandmother    Hypertension Maternal Grandmother     Social History:  Social History   Tobacco Use   Smoking status: Never   Smokeless tobacco: Never  Vaping Use   Vaping Use: Never used  Substance Use Topics   Alcohol use: Never    Comment: used birth control pills 6  months,G)PO   Drug use: Never    Review of symptoms:  Constitutional:  Negative for unexplained weight loss, night sweats, fever, chills ENT:  Negative for nose bleeds, sinus pain, painful swallowing CV:  Negative for chest pain, shortness of breath, exercise intolerance, palpitations, loss of consciousness Resp:  Negative for cough, wheezing, shortness of breath GI:  Negative for nausea, vomiting, diarrhea, bloody stools GU:  Positives noted in HPI; otherwise negative for gross hematuria Neuro:  Negative for seizures, poor balance, limb weakness, slurred speech Psych:  Negative for lack of energy, depression, anxiety Endocrine:  Negative for polydipsia, polyuria, symptoms of hypoglycemia (dizziness, hunger, sweating) Hematologic:  Negative for anemia, purpura, petechia, prolonged or excessive bleeding, use of anticoagulants  Allergic:  Negative for difficulty breathing or choking as a result of exposure to anything; no shellfish allergy; no allergic response (rash/itch) to materials, foods  Physical exam: BP 131/82   Pulse 80   Ht 5' (1.524 m)   Wt 165 lb (74.8 kg)   BMI 32.22 kg/m  GENERAL APPEARANCE:  Well appearing, well developed, well nourished, NAD HEENT: Atraumatic, Normocephalic, oropharynx clear. NECK: Supple without lymphadenopathy or thyromegaly. LUNGS: Clear to auscultation bilaterally. HEART: Regular Rate and Rhythm without murmurs, gallops, or rubs. ABDOMEN: Soft, non-tender, No Masses. EXTREMITIES:  Without clubbing, cyanosis, or edema. NEUROLOGIC:  Alert and oriented x 3, in wheelchair, CN II-XII grossly intact.  MENTAL STATUS:  Appropriate. BACK:  Non-tender to palpation.  No CVAT SKIN:  Warm, dry and intact.    Results: Results for orders placed or performed in visit on 06/30/22 (from the past 24 hour(s))  Urinalysis     Status: Abnormal   Collection Time: 06/30/22 12:00 AM  Result Value Ref Range   Glucose, UA negative negative mg/dL   Bilirubin, UA  negative    Ketones, UA negative    Spec Grav, UA >=1.030 (A) 1.010 - 1.025   Blood, UA negative    pH, UA 5.5 5.0 - 8.0   Protein, UA Positive (A) Negative   Urobilinogen, UA 0.2 0.2 or 1.0 E.U./dL   Nitrite, UA negative    Leukocytes, UA Trace (A) Negative     PVR = 19 ml

## 2022-07-11 ENCOUNTER — Telehealth: Payer: Self-pay

## 2022-07-11 NOTE — Telephone Encounter (Signed)
Incoming call on triage line from pt regarding medication samples of Myrbetriq. Pt was seen in clinic on 06/30/22 and was given samples of Myrbetriq for leaking. Pt has not seen any improvement in symptoms and would like to know why. Advised pt she would not notice overnight change in her symptoms, she should continue taking the medication for a few weeks before she should notice any change, if any. Pt is due to return to clinic on 07/31/22 at which time she will be able to discuss changing the medication with the doctor. Pt confirmed appt and expressed understanding.

## 2022-07-12 ENCOUNTER — Telehealth: Payer: Self-pay | Admitting: Family Medicine

## 2022-07-12 DIAGNOSIS — E119 Type 2 diabetes mellitus without complications: Secondary | ICD-10-CM

## 2022-07-12 DIAGNOSIS — G809 Cerebral palsy, unspecified: Secondary | ICD-10-CM

## 2022-07-12 NOTE — Telephone Encounter (Signed)
Patient's diabetes coordinator, Christeen Douglas, spoke with me today regarding recent visit with patient. She states that patient voiced concerns regarding access to food during the day while her husband is away for work. States that they have food in the house, but given her limited mobility, she has difficulty accessing it while she is home alone. He has a fridge and microwave set up in the room where she spends most of her time.   I will attempt referral for home health to see if she is a candidate for any services.     Purcell Nails Olevia Bowens, DNP, FNP-C

## 2022-07-12 NOTE — Telephone Encounter (Signed)
Larene Beach, Diabetes Educator, is calling to speak with Caleen Jobs about patient. Her direct callback [phone]

## 2022-07-27 ENCOUNTER — Encounter: Payer: Self-pay | Admitting: Family Medicine

## 2022-07-27 ENCOUNTER — Ambulatory Visit (INDEPENDENT_AMBULATORY_CARE_PROVIDER_SITE_OTHER): Payer: Managed Care, Other (non HMO) | Admitting: Family Medicine

## 2022-07-27 VITALS — BP 138/94 | HR 101 | Temp 98.3°F | Resp 18 | Ht 60.0 in | Wt 169.0 lb

## 2022-07-27 DIAGNOSIS — R6889 Other general symptoms and signs: Secondary | ICD-10-CM | POA: Diagnosis not present

## 2022-07-27 DIAGNOSIS — N3000 Acute cystitis without hematuria: Secondary | ICD-10-CM | POA: Diagnosis not present

## 2022-07-27 DIAGNOSIS — Z1152 Encounter for screening for COVID-19: Secondary | ICD-10-CM

## 2022-07-27 LAB — POC COVID19 BINAXNOW: SARS Coronavirus 2 Ag: NEGATIVE

## 2022-07-27 LAB — POCT INFLUENZA A/B
Influenza A, POC: NEGATIVE
Influenza B, POC: NEGATIVE

## 2022-07-27 NOTE — Progress Notes (Signed)
Acute Office Visit  Subjective:     Patient ID: Alicia Pena, female    DOB: Feb 28, 1971, 52 y.o.   MRN: [id number]  Chief Complaint  Patient presents with   Chills   Nasal Congestion   Cough     Patient is in today for fatigue, chills, cough.   Patient states she started feeling poorly over the weekend. No known sick contacts, but she did go out for dinner last weekend. She has been having hot/cold flashes, chills, nausea, body aches, coughing, sneezing, fatigue, watery eyes, rhinorrhea. Her chronic hip/back pain has also been flaring up. She denies fevers, chest pain, dyspnea, urinary symptoms, hypo/hyperglycemic episodes.     All review of systems negative except what is listed in the HPI      Objective:    BP (!) 138/94   Pulse (!) 101   Temp 98.3 F (36.8 C)   Resp 18   Ht 5' (1.524 m)   Wt 169 lb (76.7 kg)   SpO2 96%   BMI 33.01 kg/m    Physical Exam Vitals reviewed.  Constitutional:      Appearance: Normal appearance.  HENT:     Head: Normocephalic and atraumatic.     Nose: Rhinorrhea present.  Eyes:     Conjunctiva/sclera: Conjunctivae normal.  Cardiovascular:     Rate and Rhythm: Normal rate and regular rhythm.     Pulses: Normal pulses.     Heart sounds: Normal heart sounds.  Pulmonary:     Effort: Pulmonary effort is normal.     Breath sounds: Normal breath sounds.  Musculoskeletal:     Cervical back: Normal range of motion and neck supple.  Skin:    General: Skin is warm and dry.  Neurological:     Mental Status: She is alert and oriented to person, place, and time.  Psychiatric:        Mood and Affect: Mood normal.        Behavior: Behavior normal.        Thought Content: Thought content normal.        Judgment: Judgment normal.     Results for orders placed or performed in visit on 07/27/22  POC COVID-19  Result Value Ref Range   SARS Coronavirus 2 Ag Negative Negative  POCT Influenza A/B  Result Value Ref Range    Influenza A, POC Negative Negative   Influenza B, POC Negative Negative        Assessment & Plan:   Problem List Items Addressed This Visit   None Visit Diagnoses     Encounter for screening for COVID-19    -  Primary   Relevant Orders   POC COVID-19 (Completed)   Flu-like symptoms       Relevant Orders   POCT Influenza A/B (Completed)   CBC with Differential/Platelet   Comprehensive metabolic panel   Urinalysis with Culture Reflex     Flu and COVID testing is negative We will check your urine if you are able to leave a sample  Will add on some blood work as well to be safe There are a lot of viruses going around. Continue supportive measures including rest, hydration, humidifier use, steam showers, warm compresses to sinuses, warm liquids with lemon and honey, and over-the-counter cough, cold, and analgesics as needed.   Patient aware of signs/symptoms requiring further/urgent evaluation.     No orders of the defined types were placed in this encounter.   Return if symptoms worsen  or fail to improve.  Terrilyn Saver, NP

## 2022-07-27 NOTE — Patient Instructions (Signed)
Flu and COVID testing is negative We will check your urine if you are able to leave a sample  Will add on some blood work as well to be safe There are a lot of viruses going around. Continue supportive measures including rest, hydration, humidifier use, steam showers, warm compresses to sinuses, warm liquids with lemon and honey, and over-the-counter cough, cold, and analgesics as needed.   The following information is provided as a Human resources officer for ADULT patients only and does NOT take into account PREGNANCY, ALLERGIES, LIVER CONDITIONS, KIDNEY CONDITIONS, GASTROINTESTINAL CONDITIONS, OR PRESCRIPTION MEDICATION INTERACTIONS. Please be sure to ask your provider if the following are safe to take with your specific medical history, conditions, or current medication regimen if you are unsure.    Congestion: Guaifenesin (Mucinex)- follow directions on packaging with a maximum dose of '2400mg'$  in a 24 hour period.  Pain/Fever: Ibuprofen '200mg'$  - '400mg'$  every 4-6 hours as needed (MAX '1200mg'$  in a 24 hour period) Pain/Fever: Tylenol '500mg'$  -'1000mg'$  every 6-8 hours as needed (MAX '3000mg'$  in a 24 hour period)  Cough: Dextromethorphan (Delsym)- follow directions on packing with a maximum dose of '120mg'$  in a 24 hour period.  Nasal Stuffiness: Saline nasal spray and/or Nettie Pot with sterile saline solution  Runny Nose: Fluticasone nasal spray (Flonase) OR Mometasone nasal spray (Nasonex) OR Triamcinolone Acetonide nasal spray (Nasacort)- follow directions on the packaging  Pain/Pressure: Warm washcloth to the face  Sore Throat: Warm salt water gargles  If you have allergies, you may also consider taking an oral antihistamine (like Zyrtec or Claritin) as these may also help with your symptoms.  **Many medications will have more than one ingredient, be sure you are reading the packaging carefully and not taking more than one dose of the same kind of medication at the same time or too close together. It is  OK to use formulas that have all of the ingredients you want, but do not take them in a combined medication and as separate dose too close together. If you have any questions, the pharmacist will be happy to help you decide what is safe.

## 2022-07-28 LAB — CBC WITH DIFFERENTIAL/PLATELET
Basophils Absolute: 0.1 10*3/uL (ref 0.0–0.1)
Basophils Relative: 1 % (ref 0.0–3.0)
Eosinophils Absolute: 0.4 10*3/uL (ref 0.0–0.7)
Eosinophils Relative: 7.1 % — ABNORMAL HIGH (ref 0.0–5.0)
HCT: 36.6 % (ref 36.0–46.0)
Hemoglobin: 12.2 g/dL (ref 12.0–15.0)
Lymphocytes Relative: 15.1 % (ref 12.0–46.0)
Lymphs Abs: 0.9 10*3/uL (ref 0.7–4.0)
MCHC: 33.4 g/dL (ref 30.0–36.0)
MCV: 91.9 fl (ref 78.0–100.0)
Monocytes Absolute: 0.2 10*3/uL (ref 0.1–1.0)
Monocytes Relative: 3.2 % (ref 3.0–12.0)
Neutro Abs: 4.6 10*3/uL (ref 1.4–7.7)
Neutrophils Relative %: 73.6 % (ref 43.0–77.0)
Platelets: 344 10*3/uL (ref 150.0–400.0)
RBC: 3.99 Mil/uL (ref 3.87–5.11)
RDW: 19.4 % — ABNORMAL HIGH (ref 11.5–15.5)
WBC: 6.2 10*3/uL (ref 4.0–10.5)

## 2022-07-28 LAB — COMPREHENSIVE METABOLIC PANEL
ALT: 28 U/L (ref 0–35)
AST: 20 U/L (ref 0–37)
Albumin: 4.1 g/dL (ref 3.5–5.2)
Alkaline Phosphatase: 66 U/L (ref 39–117)
BUN: 17 mg/dL (ref 6–23)
CO2: 23 mEq/L (ref 19–32)
Calcium: 8.6 mg/dL (ref 8.4–10.5)
Chloride: 108 mEq/L (ref 96–112)
Creatinine, Ser: 0.51 mg/dL (ref 0.40–1.20)
GFR: 106.82 mL/min (ref >60.00)
Glucose, Bld: 96 mg/dL (ref 70–99)
Potassium: 4.2 mEq/L (ref 3.5–5.1)
Sodium: 139 mEq/L (ref 135–145)
Total Bilirubin: 1.2 mg/dL (ref 0.2–1.2)
Total Protein: 7.5 g/dL (ref 6.0–8.3)

## 2022-07-30 LAB — URINALYSIS W MICROSCOPIC + REFLEX CULTURE
Bilirubin Urine: NEGATIVE
Glucose, UA: NEGATIVE
Hgb urine dipstick: NEGATIVE
Hyaline Cast: NONE SEEN /LPF
Ketones, ur: NEGATIVE
Nitrites, Initial: POSITIVE — AB
Protein, ur: NEGATIVE
RBC / HPF: NONE SEEN /HPF (ref 0–2)
Specific Gravity, Urine: 1.026 (ref 1.001–1.035)
pH: 5.5 (ref 5.0–8.0)

## 2022-07-30 LAB — URINE CULTURE
MICRO NUMBER:: 14476883
SPECIMEN QUALITY:: ADEQUATE

## 2022-07-30 LAB — CULTURE INDICATED

## 2022-07-31 ENCOUNTER — Ambulatory Visit: Payer: Managed Care, Other (non HMO) | Admitting: Urology

## 2022-07-31 ENCOUNTER — Encounter: Payer: Self-pay | Admitting: Urology

## 2022-07-31 ENCOUNTER — Other Ambulatory Visit: Payer: Self-pay | Admitting: Urology

## 2022-07-31 ENCOUNTER — Telehealth: Payer: Self-pay | Admitting: Family Medicine

## 2022-07-31 VITALS — BP 130/88 | HR 88 | Ht 60.0 in | Wt 169.0 lb

## 2022-07-31 DIAGNOSIS — N3941 Urge incontinence: Secondary | ICD-10-CM

## 2022-07-31 DIAGNOSIS — R319 Hematuria, unspecified: Secondary | ICD-10-CM

## 2022-07-31 DIAGNOSIS — N390 Urinary tract infection, site not specified: Secondary | ICD-10-CM

## 2022-07-31 LAB — URINALYSIS
Blood, UA: NEGATIVE
Glucose, UA: NEGATIVE mg/dL
Ketones, UA: NEGATIVE
Leukocytes, UA: NEGATIVE
Nitrite, UA: NEGATIVE
Protein, UA: NEGATIVE
Spec Grav, UA: >=1.03 — AB (ref 1.010–1.025)
Urobilinogen, UA: 0.2 E.U./dL
pH, UA: 5.5 (ref 5.0–8.0)

## 2022-07-31 MED ORDER — NITROFURANTOIN MONOHYD MACRO 100 MG PO CAPS: 100.0000 mg | ORAL_CAPSULE | Freq: Two times a day (BID) | ORAL | 0 refills | Status: AC

## 2022-07-31 MED ORDER — MIRABEGRON ER 50 MG PO TB24: 50.0000 mg | ORAL_TABLET | Freq: Every day | ORAL | 5 refills | Status: DC

## 2022-07-31 NOTE — Telephone Encounter (Signed)
Pt called stating that she had thrown up in a bag last night and did not know if [facility] needed this specimen for any reason. Please Advise.

## 2022-07-31 NOTE — Telephone Encounter (Signed)
Spoke with and she aware that we don't need the vomit and also told her lab results

## 2022-07-31 NOTE — Progress Notes (Signed)
Assessment: 1. Urge incontinence   2. Urinary tract infection with hematuria, site unspecified     Plan: I reviewed the patient's chart regarding her recent diagnosis of UTI.  I reviewed the culture results. Agree with recommended treatment with Macrobid. Diagnosis and management of overactive bladder and urge incontinence discussed with the patient.  Options for management including avoidance of dietary irritants, behavioral modification, medical therapy, neuromodulation, and chemodenervation discussed. I discussed with the patient that her bladder symptoms are likely multi factorial.  She has limited mobility making her frequency and urgency more of a problem.  It does seem that she has increased symptoms with her work responsibilities which she finds very stressful. Bladder diet sheet given. Continue trial of Myrbetriq 50 mg daily.  Return to office in 1 month.   I personally spent 45 minutes involved in face to face and non-face-to-face activities for this patient on the day of the visit.  Professional time spent included the following activities, in addition to those noted in the documentation: review of chart, review of lab results, lengthy discussion with patient regarding options for management of her urinary incontinence and expectations for treatment.   Chief Complaint:  Chief Complaint  Patient presents with   Urinary Incontinence    History of Present Illness:  Alicia Pena is a 52 y.o. female who is seen for further evaluation of urinary incontinence.  She has noted incontinence symptoms for the past year.  Her symptoms worsened since she was diagnosed with diabetes in March 2023.  She reported symptoms of urinary frequency, urgency, nocturia 2-3 times, and urge incontinence.  She has had  large volume incontinence requiring pad use throughout the day.  She has had irritation of the vaginal area associated with her pad use.  She reported some occasional dysuria.   No problems emptying her bladder.  No gross hematuria or recent UTIs.  Her urinary symptoms and incontinence are more noticeable when she is working at home.  She does have occasional stress incontinence.  She has had some recent issues with diarrhea and had some fecal incontinence.  This is not a longstanding problem for her however.  No significant problems with constipation.  No medical therapy to date.  She has a history of cerebral palsy, diabetes, and breast cancer on Arimidex.  She was given a trial of Myrbetriq 50 mg daily in 12/23.  She returns today for follow-up.  She reports some initial improvement in her symptoms with Myrbetriq.  She continues to have increased frequency, urgency, and incontinence while working from home.  Her symptoms are improved when she is away from her work duties.  She is very emotional about her current situation.   Portions of the above documentation were copied from a prior visit for review purposes only.   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   Clotting disorder ([place])    Complication of anesthesia    Depression    History of radiation therapy 10/08/2012-11/21/2012   50.4 gray to left chest wall and supraclavicular region   Personal history of chemotherapy    Personal history of radiation therapy     Past Surgical History:  Past  Surgical History:  Procedure Laterality Date   ABDOMINAL HYSTERECTOMY  2006   w/o oophorectomy   AXILLARY LYMPH NODE DISSECTION  04/04/2012   Procedure: AXILLARY LYMPH NODE DISSECTION;  Surgeon: Rolm Bookbinder, MD;  Location: [facility];  Service: General;  Laterality: Left;  Left Axillary lymph node dissection, Port a cath placement   BREAST  BIOPSY  89/07/2011   left breast uoq bx=invasive ductal ca   breast biopsy bi lateral  02/15/12   left = invasive ductal, right breast= fibroadenoma,fibrocystic changes,no atypia,hyperlasia or malignancy identified   EYE SURGERY     as a child   LEG SURGERY     LLE   MASTECTOMY COMPLETE / SIMPLE W/ SENTINEL NODE BIOPSY Left 03/21/2012   left Dr.Matthew Donne Hazel   PORTACATH PLACEMENT  04/04/2012   Procedure: INSERTION PORT-A-CATH;  Surgeon: Rolm Bookbinder, MD;  Location: [facility];  Service: General;  Laterality: Right;    Allergies:  Allergies  Allergen Reactions   Duricef [Cefadroxil] Hives and Nausea Only    Family History:  Family History  Problem Relation Age of Onset   Learning disabilities Mother    Hypertension Mother    Depression Mother    Cancer Father 58       lung   Depression Sister    Diabetes Maternal Grandmother    Hyperlipidemia Maternal Grandmother    Hypertension Maternal Grandmother     Social History:  Social History   Tobacco Use   Smoking status: Never   Smokeless tobacco: Never  Vaping Use   Vaping Use: Never used  Substance Use Topics   Alcohol use: Never    Comment: used birth control pills 6 months,G)PO   Drug use: Never    ROS: Constitutional:  Negative for fever, chills, weight loss CV: Negative for chest pain, previous MI, hypertension Respiratory:  Negative for shortness of breath, wheezing, sleep apnea, frequent cough GI:  Negative for nausea, vomiting, bloody stool, GERD  Physical exam: BP 130/88   Pulse 88   Ht 5' (1.524 m)   Wt 169 lb (76.7 kg)   BMI 33.01 kg/m  GENERAL APPEARANCE:  Well appearing, well developed, well nourished, NAD HEENT:  Atraumatic, normocephalic, oropharynx clear NECK:  Supple without lymphadenopathy or thyromegaly ABDOMEN:  Soft, non-tender, no masses EXTREMITIES:  Without clubbing, cyanosis, or edema NEUROLOGIC:  Alert and oriented x 3, in wheelchair, CN II-XII grossly  intact MENTAL STATUS:  appropriate BACK:  Non-tender to palpation, No CVAT SKIN:  Warm, dry, and intact  Results: Results for orders placed or performed in visit on 07/31/22 (from the past 24 hour(s))  Urinalysis     Status: Abnormal   Collection Time: 07/31/22 12:00 AM  Result Value Ref Range   Glucose, UA negative negative mg/dL   Bilirubin, UA 1+    Ketones, UA negative    Spec Grav, UA >=1.030 (A) 1.010 - 1.025   Blood, UA negative    pH, UA 5.5 5.0 - 8.0   Protein, UA Negative Negative   Urobilinogen, UA 0.2 0.2 or 1.0 E.U./dL   Nitrite, UA negative    Leukocytes, UA Negative Negative

## 2022-07-31 NOTE — Telephone Encounter (Signed)
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 They are requesting a change to [place].

## 2022-07-31 NOTE — Addendum Note (Signed)
Addended by: Caleen Jobs B on: 07/31/2022 08:05 AM   Modules accepted: Orders

## 2022-08-15 ENCOUNTER — Other Ambulatory Visit: Payer: Self-pay | Admitting: Family Medicine

## 2022-08-15 DIAGNOSIS — E119 Type 2 diabetes mellitus without complications: Secondary | ICD-10-CM

## 2022-08-30 ENCOUNTER — Other Ambulatory Visit: Payer: Self-pay | Admitting: *Deleted

## 2022-08-30 DIAGNOSIS — E119 Type 2 diabetes mellitus without complications: Secondary | ICD-10-CM

## 2022-08-30 MED ORDER — LANTUS SOLOSTAR 100 UNIT/ML ~~LOC~~ SOPN: PEN_INJECTOR | SUBCUTANEOUS | 99 refills | Status: DC

## 2022-09-04 ENCOUNTER — Ambulatory Visit: Payer: Managed Care, Other (non HMO) | Admitting: Urology

## 2022-09-04 ENCOUNTER — Ambulatory Visit: Payer: Managed Care, Other (non HMO) | Admitting: Family Medicine

## 2022-09-04 ENCOUNTER — Encounter: Payer: Self-pay | Admitting: Family Medicine

## 2022-09-04 VITALS — BP 142/85 | HR 117 | Temp 98.0°F | Resp 16 | Wt 174.0 lb

## 2022-09-04 DIAGNOSIS — E119 Type 2 diabetes mellitus without complications: Secondary | ICD-10-CM

## 2022-09-04 DIAGNOSIS — F419 Anxiety disorder, unspecified: Secondary | ICD-10-CM

## 2022-09-04 DIAGNOSIS — I10 Essential (primary) hypertension: Secondary | ICD-10-CM | POA: Diagnosis not present

## 2022-09-04 DIAGNOSIS — R32 Unspecified urinary incontinence: Secondary | ICD-10-CM

## 2022-09-04 DIAGNOSIS — I517 Cardiomegaly: Secondary | ICD-10-CM | POA: Diagnosis not present

## 2022-09-04 DIAGNOSIS — C50912 Malignant neoplasm of unspecified site of left female breast: Secondary | ICD-10-CM

## 2022-09-04 MED ORDER — SERTRALINE HCL 25 MG PO TABS: 25.0000 mg | ORAL_TABLET | Freq: Every day | ORAL | 3 refills | Status: DC

## 2022-09-04 NOTE — Patient Instructions (Signed)
Taking the medicine as directed and not missing any doses is one of the best things you can do to treat your anxiety/depression.  Here are some things to keep in mind: Side effects (stomach upset, some increased anxiety) may happen before you notice a benefit.  These side effects typically go away over time. Changes to your dose of medicine or a change in medication all together is sometimes necessary Many people will notice an improvement within two weeks but the full effect of the medication can take up to 4-6 weeks Stopping the medication when you start feeling better often results in a return of symptoms. Most people need to be on medication at least 6-12 months If you start having thoughts of hurting yourself or others after starting this medicine, please call me immediately.

## 2022-09-04 NOTE — Assessment & Plan Note (Signed)
Blood pressure is not at goal for age and co-morbidities.   Recommendations: continue losartan at current dose (stressed/anxious today), nurse visit to recheck in 2 weeks - BP goal <130/80 - monitor and log blood pressures at home - check around the same time each day in a relaxed setting - Limit salt to <2000 mg/day - Follow DASH eating plan (heart healthy diet) - limit alcohol to 2 standard drinks per day for men and 1 per day for women - avoid tobacco products - get at least 2 hours of regular aerobic exercise weekly Patient aware of signs/symptoms requiring further/urgent evaluation. Labs updated today.

## 2022-09-04 NOTE — Assessment & Plan Note (Signed)
Following with urology for incontinence/OAB. Doing well on Gemtesa. No new concerns today.

## 2022-09-04 NOTE — Assessment & Plan Note (Signed)
Recent follow-up PET scan with no signs of recurrent or metastatic disease! Following with oncology.

## 2022-09-04 NOTE — Progress Notes (Signed)
Established Patient Office Visit  Subjective   Patient ID: Alicia Pena, female    DOB: Mar 19, 1971  Age: 52 y.o. MRN: [id number]  CC: DM f/u    Patient is here for routine follow-up.     DIABETES: Patient reports she is still doing well. She was able to have her initial consult with endocrinology on 06/09/22. They recommend 3 month follow-up. She is scheduled to see them again later this month, but would like me to go ahead and recheck labs.   - Checking BG at home: yes, Elenor Legato; she forgot to bring her phone today, but reports glucose has been averaging low 100's - Medications: Levemir 10 units nightly - she dropped down from 12 units to see how her numbers were looking and she didn't notice any significant difference (endo discontinued metformin and Jardiance as A1c is well controlled and she was frequently missing doses and having GI/GU side effects) - will likely have to switch to Lantus after 07/03/2022 due to insurance coverage. - Compliance: good  - Diet: low-carb, portion control - Exercise: minimal - Denies symptoms of hypoglycemia, polyuria, polydipsia, numbness extremities, foot ulcers/trauma, wounds that are not healing, medication side effects  - She was given Baqsimi by endocrinology and reports that she didn't understand the direction and had been trying to use it daily.  Lab Results  Component Value Date   HGBA1C 5.5 05/16/2022     Hypertension: - Medications: losartan 25 mg daily - Compliance: good - Checking BP at home: rarely - Denies any SOB, recurrent headaches, CP, vision changes, LE edema, dizziness, palpitations, or medication side effects. - Stressors: work, health   Overactive bladder: - She is following with urology (Dr. Felipa Eth). She started [place] and that seems to be working well.  - She denies any new or worsening symptoms.    Stress and Anxiety: - Feels very stressed/pressured regarding work. States she is overwhelmed and ends up  using her breaks to catch up on work instead of eating.  - No SI/HI - PHQ9/GAD7 below    Breast cancer: - 08/25/22 PET scan surveillance: No evidence of recurrent or metastatic disease Hepatic steatosis Enlarged and heterogeneous and possible nodule right thyroid. Thyroid US on 05/05/20 and fine needle aspiration 09/16/20. No follow-up recommended unless clinically warranted.  *She is very stressed that it also mentioned enlarged heart. Last Echo on file was 2014.  Patient denies any chest pain, palpitations, dyspnea, wheezing, edema, recurrent headaches, vision changes.       09/04/2022    5:14 PM 05/16/2022    5:43 PM 11/29/2021    8:15 AM  PHQ9 SCORE ONLY  PHQ-9 Total Score 9 0 0      09/04/2022    5:15 PM  GAD 7 : Generalized Anxiety Score  Nervous, Anxious, on Edge 2  Control/stop worrying 1  Worry too much - different things 1  Trouble relaxing 0  Restless 0  Easily annoyed or irritable 2  Afraid - awful might happen 0  Total GAD 7 Score 6  Anxiety Difficulty Extremely difficult       ROS All review of systems negative except what is listed in the HPI    Objective:     BP (!) 142/85   Pulse (!) 117   Temp 98 F (36.7 C) (Oral)   Resp 16   Wt 174 lb (78.9 kg)   SpO2 97%   BMI 33.98 kg/m    Physical Exam Vitals reviewed.  Constitutional:  Appearance: Normal appearance. She is obese.  HENT:     Head: Normocephalic and atraumatic.  Cardiovascular:     Rate and Rhythm: Normal rate and regular rhythm.     Pulses: Normal pulses.     Heart sounds: Normal heart sounds.  Pulmonary:     Effort: Pulmonary effort is normal.     Breath sounds: Normal breath sounds.  Musculoskeletal:     Cervical back: Normal range of motion and neck supple.     Right lower leg: No edema.     Left lower leg: No edema.  Skin:    General: Skin is warm and dry.  Neurological:     General: No focal deficit present.     Mental Status: She is alert and oriented to person,  place, and time. Mental status is at baseline.  Psychiatric:        Thought Content: Thought content normal.        Judgment: Judgment normal.     Comments: Tearful       No results found for any visits on 09/04/22.    The 10-year ASCVD risk score (Arnett DK, et al., 2019) is: 7.4%    Assessment & Plan:    Problem List Items Addressed This Visit       Cardiovascular and Mediastinum   Hypertension (Chronic)    Blood pressure is not at goal for age and co-morbidities.   Recommendations: continue losartan at current dose (stressed/anxious today), nurse visit to recheck in 2 weeks - BP goal <130/80 - monitor and log blood pressures at home - check around the same time each day in a relaxed setting - Limit salt to <2000 mg/day - Follow DASH eating plan (heart healthy diet) - limit alcohol to 2 standard drinks per day for men and 1 per day for women - avoid tobacco products - get at least 2 hours of regular aerobic exercise weekly Patient aware of signs/symptoms requiring further/urgent evaluation. Labs updated today.       Relevant Orders   CBC with Differential/Platelet   Comprehensive metabolic panel   Lipid panel   TSH     Endocrine   Type 2 diabetes mellitus without complication, without long-term current use of insulin (HCC) (Chronic)    A1c previously stable. Recheck today  Continue current medications - Levemir 12 units daily (change to alternative at next refill due to Levemir discontinuation) On ARB Discussed diet and exercise. Continue monitoring glucose. F/u in 3 months       Relevant Orders   CBC with Differential/Platelet   Comprehensive metabolic panel   Hemoglobin A1c   Lipid panel     Other   Breast cancer (HCC) (Chronic)    Recent follow-up PET scan with no signs of recurrent or metastatic disease! Following with oncology.       Urinary incontinence (Chronic)    Following with urology for incontinence/OAB. Doing well on Gemtesa. No new  concerns today.       Other Visit Diagnoses     Anxiety    -  Primary Tearful in office today. Reports she is very stressed/anxious about her work situation. She is going to talk with her employer regarding potential changes. No SI/HI. She is interested in trying Zoloft and counseling - referral placed.   Taking the medicine as directed and not missing any doses is one of the best things you can do to treat your anxiety/depression.  Here are some things to keep in mind: Side effects (  stomach upset, some increased anxiety) may happen before you notice a benefit.  These side effects typically go away over time. Changes to your dose of medicine or a change in medication all together is sometimes necessary Many people will notice an improvement within two weeks but the full effect of the medication can take up to 4-6 weeks Stopping the medication when you start feeling better often results in a return of symptoms. Most people need to be on medication at least 6-12 months If you start having thoughts of hurting yourself or others after starting this medicine, please call me immediately.      Relevant Medications   sertraline (ZOLOFT) 25 MG tablet   Other Relevant Orders   Ambulatory referral to [place]   Cardiomegaly     Patient very anxious about cardiomegaly finding on recent PET that was not mentioned last year. Discussed potential causes. She would like to start with an Echo - not interested in adding another specialist at this time if possible, stating it is already so hard for her to arrange her and her husband's schedules to get to all of her follow-up appointments.  No new symptoms. Discussed signs/symptoms to monitor for.    Relevant Orders   ECHOCARDIOGRAM COMPLETE          Return in about 3 months (around 12/05/2022) for routine follow-up; 2 week nurse visit BP check .    Terrilyn Saver, NP

## 2022-09-04 NOTE — Assessment & Plan Note (Signed)
A1c previously stable. Recheck today  Continue current medications - Levemir 12 units daily (change to alternative at next refill due to Levemir discontinuation) On ARB Discussed diet and exercise. Continue monitoring glucose. F/u in 3 months

## 2022-09-05 LAB — COMPREHENSIVE METABOLIC PANEL
ALT: 20 U/L (ref 0–35)
AST: 14 U/L (ref 0–37)
Albumin: 3.9 g/dL (ref 3.5–5.2)
Alkaline Phosphatase: 62 U/L (ref 39–117)
BUN: 16 mg/dL (ref 6–23)
CO2: 28 mEq/L (ref 19–32)
Calcium: 9.1 mg/dL (ref 8.4–10.5)
Chloride: 105 mEq/L (ref 96–112)
Creatinine, Ser: 0.6 mg/dL (ref 0.40–1.20)
GFR: 102.64 mL/min (ref >60.00)
Glucose, Bld: 85 mg/dL (ref 70–99)
Potassium: 4 mEq/L (ref 3.5–5.1)
Sodium: 142 mEq/L (ref 135–145)
Total Bilirubin: 1.1 mg/dL (ref 0.2–1.2)
Total Protein: 7 g/dL (ref 6.0–8.3)

## 2022-09-05 LAB — CBC WITH DIFFERENTIAL/PLATELET
Basophils Absolute: 0.1 10*3/uL (ref 0.0–0.1)
Basophils Relative: 1 % (ref 0.0–3.0)
Eosinophils Absolute: 0.1 10*3/uL (ref 0.0–0.7)
Eosinophils Relative: 1.4 % (ref 0.0–5.0)
HCT: 35.8 % — ABNORMAL LOW (ref 36.0–46.0)
Hemoglobin: 12 g/dL (ref 12.0–15.0)
Lymphocytes Relative: 21.6 % (ref 12.0–46.0)
Lymphs Abs: 1.1 10*3/uL (ref 0.7–4.0)
MCHC: 33.4 g/dL (ref 30.0–36.0)
MCV: 91.2 fl (ref 78.0–100.0)
Monocytes Absolute: 0.2 10*3/uL (ref 0.1–1.0)
Monocytes Relative: 3.7 % (ref 3.0–12.0)
Neutro Abs: 3.7 10*3/uL (ref 1.4–7.7)
Neutrophils Relative %: 72.3 % (ref 43.0–77.0)
Platelets: 279 10*3/uL (ref 150.0–400.0)
RBC: 3.93 Mil/uL (ref 3.87–5.11)
RDW: 18.3 % — ABNORMAL HIGH (ref 11.5–15.5)
WBC: 5.2 10*3/uL (ref 4.0–10.5)

## 2022-09-05 LAB — LIPID PANEL
Cholesterol: 155 mg/dL (ref 0–200)
HDL: 45.6 mg/dL (ref >39.00)
LDL Cholesterol: 90 mg/dL (ref 0–99)
NonHDL: 109.77
Total CHOL/HDL Ratio: 3
Triglycerides: 98 mg/dL (ref 0.0–149.0)
VLDL: 19.6 mg/dL (ref 0.0–40.0)

## 2022-09-05 LAB — HEMOGLOBIN A1C: Hgb A1c MFr Bld: 5.3 % (ref 4.6–6.5)

## 2022-09-05 LAB — TSH: TSH: 0.89 u[IU]/mL (ref 0.35–5.50)

## 2022-09-28 ENCOUNTER — Other Ambulatory Visit: Payer: Self-pay | Admitting: Family Medicine

## 2022-09-28 DIAGNOSIS — F419 Anxiety disorder, unspecified: Secondary | ICD-10-CM

## 2022-10-10 ENCOUNTER — Telehealth: Payer: Self-pay | Admitting: Family Medicine

## 2022-10-10 DIAGNOSIS — E119 Type 2 diabetes mellitus without complications: Secondary | ICD-10-CM

## 2022-10-10 NOTE — Telephone Encounter (Signed)
Pt states her blood sugar has been ranging from 71-54, no lightheadedness but her legs were tingling the past couple days. Transferred to triage for nurse eval.

## 2022-10-10 NOTE — Telephone Encounter (Signed)
Per Ladona Ridgel-  Decrease lantus to 8 units daily and continue monitoring glucose levels. Make sure she is eating regular, balanced meals. Schedule a follow-up for 1 week or sooner if glucose continue to drop despite lowering insulin dose.    Spoke w/ Pt- informed of recommendations. Pt verbalized understanding. 1 week f/u scheduled. Med list updated

## 2022-10-10 NOTE — Telephone Encounter (Signed)
Spoke w/ access nurse- blood sugar is 71 this morning, this past weekend anywhere 62-69, lowest at 54. Improves with carbs/sugars for short amount of time then dips again. She did stay out of work yesterday d/t fluctuations- she does have a freestyle libre that alerts her when she drops below 69. She is currently on Lantus 12 units qd. She is requesting advice on what to do.

## 2022-10-10 NOTE — Telephone Encounter (Signed)
Initial Comment Caller states her sugar level is 71. Translation No Nurse Assessment Nurse: Roxan Hockey, RN, Alicia Pena Date/Time (Eastern Time): 10/10/2022 8:18:11 AM Confirm and document reason for call. If symptomatic, describe symptoms. ---Caller states her sugar level is 71. She states Sunday her BS level was in the 60s and dropped at as low as 54. She stayed home from work yesterday. She states despite drinking juice and a carb and it would go up for a while then it will drop back down. She has her libre 3 meter set to alert at below 69. She has been having right leg tingling as well. Does the patient have any new or worsening symptoms? ---Yes Will a triage be completed? ---Yes Related visit to physician within the last 2 weeks? ---No Does the PT have any chronic conditions? (i.e. diabetes, asthma, this includes High risk factors for pregnancy, etc.) ---Yes List chronic conditions. ---diabetes, HTN Is the patient pregnant or possibly pregnant? (Ask all females between the ages of 61-55) ---No Is this a behavioral health or substance abuse call? ---No Guidelines Guideline Title Affirmed Question Affirmed Notes Nurse Date/Time (Eastern Time) Diabetes - Low Blood Sugar Low blood sugar definition and treatment, questions about Alicia Pena, Alicia Pena 10/10/2022 8:25:04 AM PLEASE NOTE: All timestamps contained within this report are represented as [place] Standard Time. CONFIDENTIALTY NOTICE: This fax transmission is intended only for the addressee. It contains information that is legally privileged, confidential or otherwise protected from use or disclosure. If you are not the intended recipient, you are strictly prohibited from reviewing, disclosing, copying using or disseminating any of this information or taking any action in reliance on or regarding this information. If you have received this fax in error, please notify us immediately by telephone so that we can arrange for its return to  [place]. Phone: [phone], Toll-Free: [phone], Fax: [phone] Page: 2 of 2 Call Id: [id number] Disp. Time Lamount Cohen Time) Disposition Final User 10/10/2022 8:32:06 AM Home Care Yes Roxan Hockey, RN, Alicia Pena Final Disposition 10/10/2022 8:32:06 AM Home Care Yes Roxan Hockey, RN, Alicia Pena Caller Disagree/Comply Comply Caller Understands Yes PreDisposition InappropriateToAsk Care Advice Given Per Guideline HOME CARE: * You should be able to treat this at home. LOW BLOOD SUGAR (HYPOGLYCEMIA) - DEFINITION: * Low blood sugar (hypoglycemia) is defined as a blood glucose of 70 mg/dL (3.9 mmol/L) or below. * Symptoms of mild hypoglycemia: Shakiness, weakness, not thinking clearly, headache, trembling, sweating, dizziness, palpitations, and hunger. LOW BLOOD SUGAR - TREATMENT - EAT SOME (15-20 GRAMS) SUGAR NOW: * Fruit juice or non-diet soda (1/2 cup; 120 ml) LOW BLOOD SUGAR - EXPECTED COURSE: * The low blood sugar symptoms should start getting better in 5 to 10 minutes. * The symptoms should resolve in about 15 to 20 minutes. DAILY BLOOD GLUCOSE GOALS: * You and your doctor (or NP/PA) should decide upon your blood glucose goals. Typical goals for most non-pregnant adults who perform daily finger-stick blood glucose testing at home are as follows. * Pre-prandial (before meal): 80-130 mg/dL (3.2-9.1 mmol/L) * Post-prandial (1-2 hours after a meal): Less than 180 mg/dL (10 mmol/L) CALL BACK IF: * You have more questions * You become worse CARE ADVICE given per Diabetes - Low Blood Sugar (Adult) guideline. Comments User: Juel Burrow, RN Date/Time Lamount Cohen Time): 10/10/2022 8:38:50 AM Message left with office to call patient back

## 2022-10-13 ENCOUNTER — Ambulatory Visit: Payer: Managed Care, Other (non HMO)

## 2022-10-18 ENCOUNTER — Encounter: Payer: Self-pay | Admitting: Family Medicine

## 2022-10-18 ENCOUNTER — Ambulatory Visit: Payer: Managed Care, Other (non HMO) | Admitting: Family Medicine

## 2022-10-18 DIAGNOSIS — E119 Type 2 diabetes mellitus without complications: Secondary | ICD-10-CM | POA: Diagnosis not present

## 2022-10-18 MED ORDER — FREESTYLE LIBRE 3 SENSOR MISC: 5 refills | Status: DC

## 2022-10-18 NOTE — Progress Notes (Signed)
   Established Patient Office Visit  Subjective   Patient ID: Alicia Pena, female    DOB: 06-Aug-1970  Age: 52 y.o. MRN: [id number]  Chief Complaint  Patient presents with   Medical Management of Chronic Issues    HPI  Patient is here to discuss her glucose levels.   She sent [place] a call/message last week reporting some hypoglycemic events. She reports she was getting several readings in the 50s - but a day or so later she got an alert that she had a bad sensor. She checked her glucose manually once and it was in the 70s.  She had been taking Levemir 10 units nightly. Since changing out her sensor, she has been averaging glucose around 80. She has also decreased to 8 units of Levemir lately and doing well. No other symptoms.           ROS All review of systems negative except what is listed in the HPI    Objective:     BP 138/85   Pulse 87   Ht 5' (1.524 m)   SpO2 98%   BMI 33.98 kg/m    Physical Exam Vitals reviewed.  Constitutional:      Appearance: Normal appearance. She is obese.  HENT:     Head: Normocephalic and atraumatic.  Cardiovascular:     Rate and Rhythm: Normal rate and regular rhythm.     Pulses: Normal pulses.     Heart sounds: Normal heart sounds.  Pulmonary:     Effort: Pulmonary effort is normal.     Breath sounds: Normal breath sounds.  Musculoskeletal:     Cervical back: Normal range of motion and neck supple.     Right lower leg: No edema.     Left lower leg: No edema.  Skin:    General: Skin is warm and dry.  Neurological:     General: No focal deficit present.     Mental Status: She is alert and oriented to person, place, and time. Mental status is at baseline.  Psychiatric:        Thought Content: Thought content normal.        Judgment: Judgment normal.      No results found for any visits on 10/18/22.    The 10-year ASCVD risk score (Arnett DK, et al., 2019) is: 4.5%    Assessment & Plan:   Problem List  Items Addressed This Visit     Type 2 diabetes mellitus without complication, without long-term current use of insulin (Chronic) Libre sensor refill sent in  Continue tracking glucose.  Can reduce to 6 units Levemir daily and watch trends and symptoms  Follow-up in June to recheck A1c      Relevant Medications   Continuous Glucose Sensor (FREESTYLE LIBRE 3 SENSOR) MISC    Return for after December 05 2022.    Clayborne Dana, NP

## 2022-10-18 NOTE — Patient Instructions (Signed)
Schedule routine follow-up in June.  You can try cutting insulin down to 6 units nightly and monitoring fasting glucose.

## 2022-11-01 ENCOUNTER — Ambulatory Visit: Payer: Managed Care, Other (non HMO) | Admitting: Urology

## 2022-11-01 ENCOUNTER — Encounter: Payer: Self-pay | Admitting: Urology

## 2022-11-01 VITALS — BP 96/68 | HR 78 | Ht 60.0 in | Wt 173.0 lb

## 2022-11-01 DIAGNOSIS — R8290 Unspecified abnormal findings in urine: Secondary | ICD-10-CM

## 2022-11-01 DIAGNOSIS — N3941 Urge incontinence: Secondary | ICD-10-CM

## 2022-11-01 DIAGNOSIS — Z87440 Personal history of urinary (tract) infections: Secondary | ICD-10-CM

## 2022-11-01 LAB — URINALYSIS, ROUTINE W REFLEX MICROSCOPIC
Bilirubin, UA: NEGATIVE
Glucose, UA: NEGATIVE
Nitrite, UA: NEGATIVE
Protein,UA: NEGATIVE
Specific Gravity, UA: 1.025 (ref 1.005–1.030)
Urobilinogen, Ur: 1 mg/dL (ref 0.2–1.0)
pH, UA: 5 (ref 5.0–7.5)

## 2022-11-01 LAB — MICROSCOPIC EXAMINATION
Cast Type: NONE SEEN
Casts: NONE SEEN /lpf
Crystal Type: NONE SEEN
Crystals: NONE SEEN
Trichomonas, UA: NONE SEEN
WBC, UA: >30 /hpf — AB (ref 0–5)
Yeast, UA: NONE SEEN

## 2022-11-01 NOTE — Progress Notes (Signed)
Assessment: 1. Urge incontinence   2. History of UTI   3. Abnormal urine findings     Plan: Continue Gemtesa 75 mg daily. Urine culture sent today.  Will contact her with results. Return to office in 4 months.  Chief Complaint:  Chief Complaint  Patient presents with   Follow-up    History of Present Illness:  Alicia Pena is a 52 y.o. female who is seen for further evaluation of urinary incontinence.  She has noted incontinence symptoms for the past year.  Her symptoms worsened since she was diagnosed with diabetes in March 2023.  She reported symptoms of urinary frequency, urgency, nocturia 2-3 times, and urge incontinence.  She has had  large volume incontinence requiring pad use throughout the day.  She has had irritation of the vaginal area associated with her pad use.  She reported some occasional dysuria.  No problems emptying her bladder.  No gross hematuria or recent UTIs.  Her urinary symptoms and incontinence are more noticeable when she is working at home.  She does have occasional stress incontinence.  She has had some recent issues with diarrhea and had some fecal incontinence.  This is not a longstanding problem for her however.  No significant problems with constipation.  No medical therapy to date.  She has a history of cerebral palsy, diabetes, and breast cancer on Arimidex.  She was given a trial of Myrbetriq 50 mg daily in 12/23.  At her visit in 1/24, she reported some initial improvement in her symptoms with Myrbetriq.  She continued to have increased frequency, urgency, and incontinence while working from home.  Her symptoms were improved when she is away from her work duties.    She returns today for follow-up.  She is currently on Gemtesa 75 mg daily with significant improvement in her incontinence.  She has decreased frequency, urgency, and incontinence.  She is able to leave her house without significant concern for incontinence episodes.  No  dysuria or gross hematuria.  No UTIs since her visit in 1/24.  Portions of the above documentation were copied from a prior visit for review purposes only.   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   Clotting disorder (HCC)    Complication of anesthesia    Depression    History of radiation therapy 10/08/2012-11/21/2012   50.4 gray to left chest wall and supraclavicular region   Personal history of chemotherapy    Personal history of radiation therapy     Past Surgical History:  Past Surgical History:  Procedure Laterality Date   ABDOMINAL HYSTERECTOMY  2006   w/o oophorectomy   AXILLARY LYMPH NODE DISSECTION  04/04/2012   Procedure: AXILLARY LYMPH NODE DISSECTION;  Surgeon: Emelia Loron, MD;  Location: [facility];  Service: General;  Laterality: Left;  Left Axillary lymph node dissection, Port a cath placement   BREAST BIOPSY  89/07/2011   left breast uoq bx=invasive ductal ca   breast biopsy bi lateral  02/15/12   left = invasive ductal, right breast= fibroadenoma,fibrocystic changes,no atypia,hyperlasia or malignancy identified   EYE SURGERY     as a child   LEG SURGERY  LLE   MASTECTOMY COMPLETE / SIMPLE W/ SENTINEL NODE BIOPSY Left 03/21/2012   left Dr.Matthew Bethann Humble PLACEMENT  04/04/2012   Procedure: INSERTION PORT-A-CATH;  Surgeon: Emelia Loron, MD;  Location: [facility];  Service: General;  Laterality: Right;    Allergies:  Allergies  Allergen Reactions   Duricef [Cefadroxil] Hives and Nausea Only    Family History:  Family History  Problem Relation Age of Onset   Learning disabilities Mother    Hypertension Mother    Depression Mother    Cancer  Father 27       lung   Depression Sister    Diabetes Maternal Grandmother    Hyperlipidemia Maternal Grandmother    Hypertension Maternal Grandmother     Social History:  Social History   Tobacco Use   Smoking status: Never   Smokeless tobacco: Never  Vaping Use   Vaping Use: Never used  Substance Use Topics   Alcohol use: Never    Comment: used birth control pills 6 months,G)PO   Drug use: Never    ROS: Constitutional:  Negative for fever, chills, weight loss CV: Negative for chest pain, previous MI, hypertension Respiratory:  Negative for shortness of breath, wheezing, sleep apnea, frequent cough GI:  Negative for nausea, vomiting, bloody stool, GERD  Physical exam: BP 96/68   Pulse 78   Ht 5' (1.524 m)   Wt 173 lb (78.5 kg)   BMI 33.79 kg/m  GENERAL APPEARANCE:  Well appearing, well developed, well nourished, NAD HEENT:  Atraumatic, normocephalic, oropharynx clear NECK:  Supple without lymphadenopathy or thyromegaly ABDOMEN:  Soft, non-tender, no masses EXTREMITIES:  Moves all extremities well, without clubbing, cyanosis, or edema NEUROLOGIC:  Alert and oriented x 3, in wheelchair, CN II-XII grossly intact MENTAL STATUS:  appropriate BACK:  Non-tender to palpation, No CVAT SKIN:  Warm, dry, and intact  Results: U/A:  >30 WBC, many bacteria

## 2022-11-04 ENCOUNTER — Encounter: Payer: Self-pay | Admitting: Urology

## 2022-11-04 LAB — URINE CULTURE

## 2022-11-04 MED ORDER — AMOXICILLIN 500 MG PO TABS: 500.0000 mg | ORAL_TABLET | Freq: Two times a day (BID) | ORAL | 0 refills | Status: AC

## 2022-11-04 NOTE — Addendum Note (Signed)
Addended by: Milderd Meager on: 11/04/2022 11:44 AM   Modules accepted: Orders

## 2022-11-07 NOTE — Telephone Encounter (Signed)
Patient called back and said she was returning someone's call back.  Patients call back #: [phone]

## 2022-11-27 ENCOUNTER — Other Ambulatory Visit: Payer: Self-pay | Admitting: Family

## 2022-11-27 ENCOUNTER — Other Ambulatory Visit: Payer: Self-pay | Admitting: Family Medicine

## 2022-11-27 DIAGNOSIS — E119 Type 2 diabetes mellitus without complications: Secondary | ICD-10-CM

## 2022-11-27 DIAGNOSIS — I10 Essential (primary) hypertension: Secondary | ICD-10-CM

## 2022-11-27 MED ORDER — LOSARTAN POTASSIUM 25 MG PO TABS: 25.0000 mg | ORAL_TABLET | Freq: Every day | ORAL | 1 refills | Status: DC

## 2022-12-13 ENCOUNTER — Ambulatory Visit (INDEPENDENT_AMBULATORY_CARE_PROVIDER_SITE_OTHER): Payer: Managed Care, Other (non HMO) | Admitting: Family Medicine

## 2022-12-13 ENCOUNTER — Encounter: Payer: Self-pay | Admitting: Family Medicine

## 2022-12-13 VITALS — BP 140/84 | HR 86

## 2022-12-13 DIAGNOSIS — Z794 Long term (current) use of insulin: Secondary | ICD-10-CM | POA: Diagnosis not present

## 2022-12-13 DIAGNOSIS — N949 Unspecified condition associated with female genital organs and menstrual cycle: Secondary | ICD-10-CM | POA: Diagnosis not present

## 2022-12-13 DIAGNOSIS — E119 Type 2 diabetes mellitus without complications: Secondary | ICD-10-CM | POA: Diagnosis not present

## 2022-12-13 DIAGNOSIS — I10 Essential (primary) hypertension: Secondary | ICD-10-CM | POA: Diagnosis not present

## 2022-12-13 DIAGNOSIS — F411 Generalized anxiety disorder: Secondary | ICD-10-CM | POA: Diagnosis not present

## 2022-12-13 MED ORDER — FLUCONAZOLE 150 MG PO TABS
150.0000 mg | ORAL_TABLET | Freq: Every day | ORAL | 0 refills | Status: DC
Start: 2022-12-13 — End: 2023-01-17

## 2022-12-13 NOTE — Patient Instructions (Addendum)
Call your insurance to see which alternative to Lantus/Levemir is covered.

## 2022-12-13 NOTE — Assessment & Plan Note (Signed)
Blood pressure is not at goal for age and co-morbidities.   Recommendations: continue losartan at current dose (stressed/anxious today), monitor at home - BP goal <130/80 - monitor and log blood pressures at home - check around the same time each day in a relaxed setting - Limit salt to <2000 mg/day - Follow DASH eating plan (heart healthy diet) - limit alcohol to 2 standard drinks per day for men and 1 per day for women - avoid tobacco products - get at least 2 hours of regular aerobic exercise weekly Patient aware of signs/symptoms requiring further/urgent evaluation. Labs updated today.

## 2022-12-13 NOTE — Assessment & Plan Note (Signed)
A1c previously stable. Recheck today  Continue current medications - Levemir 8 units daily (change to alternative at next refill due to Levemir discontinuation - they state Lantus is not covered, but they will call insurance to see what alternatives is available) On ARB Discussed diet and exercise. Continue monitoring glucose. F/u in 3 months

## 2022-12-13 NOTE — Assessment & Plan Note (Signed)
No SI/HI Declined referral - she will check with her employer's resources Does not want to be on medication.

## 2022-12-13 NOTE — Progress Notes (Signed)
Established Patient Office Visit  Subjective   Patient ID: Alicia Pena, female    DOB: [birth year]  Age: 52 y.o. MRN: [id number]  CC: DM f/u    Patient is here for routine follow-up. Her husband is with her today for appointment.    DIABETES: Patient reports she is still decent. She was able to have her initial consult with endocrinology on 06/09/22. - Checking BG at home: yes, Libre (averaging 122 for the past 30 days) - Medications: Levemir 8 units nightly (endo previously discontinued metformin and Jardiance as A1c is well controlled and she was frequently missing doses and having GI/GU side effects). We tried to make next refill Lantus, but insurance denied - they are going to check with insurance for alternative.  - Compliance: good  - Diet: low-carb, portion control (inconsistent lately - pasta, rice, etc) - Exercise: minimal - Denies symptoms of hypoglycemia, polyuria, polydipsia, numbness extremities, foot ulcers/trauma, wounds that are not healing, medication side effects  Lab Results  Component Value Date   HGBA1C 5.3 09/04/2022    Hypertension: - Medications: losartan 25 mg daily - Compliance: good - Checking BP at home: rarely - Denies any SOB, recurrent headaches, CP, vision changes, LE edema, dizziness, palpitations, or medication side effects. - Stressors: work, health  Stress and Anxiety: - Feels very stressed/pressured regarding work. States she is overwhelmed with life, work. - No SI/HI - In March, we stared Zoloft 25 mg, which she reports made her feel to "blah." She wants to focus on lifestyle measures for now.  - She is going to check with her employer EAC program  Perineal itching: - Skin was looking "grayish-white" for awhile per husband, she is having a lot of perineal itching, especially at night - keeps her from sleeping. - This has been going on for the past several months.  - No discharge, no internal itching, all external. OTC  treatment did not help - She has been trying to keep it shaved, but that is not helping        09/04/2022    5:14 PM 05/16/2022    5:43 PM 11/29/2021    8:15 AM  PHQ9 SCORE ONLY  PHQ-9 Total Score 9 0 0      09/04/2022    5:15 PM  GAD 7 : Generalized Anxiety Score  Nervous, Anxious, on Edge 2  Control/stop worrying 1  Worry too much - different things 1  Trouble relaxing 0  Restless 0  Easily annoyed or irritable 2  Afraid - awful might happen 0  Total GAD 7 Score 6  Anxiety Difficulty Extremely difficult       ROS All review of systems negative except what is listed in the HPI    Objective:     BP (!) 140/84   Pulse 86   SpO2 98%    Physical Exam Vitals reviewed.  Constitutional:      Appearance: Normal appearance. She is obese.  HENT:     Head: Normocephalic and atraumatic.  Cardiovascular:     Rate and Rhythm: Normal rate and regular rhythm.     Pulses: Normal pulses.     Heart sounds: Normal heart sounds.  Pulmonary:     Effort: Pulmonary effort is normal.     Breath sounds: Normal breath sounds.  Genitourinary:    Comments: Perineal area with several areas of dry/flaking skin and hypopigmentation  Musculoskeletal:     Cervical back: Normal range of motion and neck supple.  Right lower leg: No edema.     Left lower leg: No edema.  Skin:    General: Skin is warm and dry.  Neurological:     General: No focal deficit present.     Mental Status: She is alert and oriented to person, place, and time. Mental status is at baseline.  Psychiatric:        Thought Content: Thought content normal.        Judgment: Judgment normal.     Comments: Tearful       No results found for any visits on 12/13/22.    The 10-year ASCVD risk score (Arnett DK, et al., 2019) is: 4.6%    Assessment & Plan:   Problem List Items Addressed This Visit     Hypertension (Chronic)    Blood pressure is not at goal for age and co-morbidities.   Recommendations:  continue losartan at current dose (stressed/anxious today), monitor at home - BP goal <130/80 - monitor and log blood pressures at home - check around the same time each day in a relaxed setting - Limit salt to <2000 mg/day - Follow DASH eating plan (heart healthy diet) - limit alcohol to 2 standard drinks per day for men and 1 per day for women - avoid tobacco products - get at least 2 hours of regular aerobic exercise weekly Patient aware of signs/symptoms requiring further/urgent evaluation. Labs updated today.       Type 2 diabetes mellitus without complication, without long-term current use of insulin (HCC) - Primary (Chronic)    A1c previously stable. Recheck today  Continue current medications - Levemir 8 units daily (change to alternative at next refill due to Levemir discontinuation - they state Lantus is not covered, but they will call insurance to see what alternatives is available) On ARB Discussed diet and exercise. Continue monitoring glucose. F/u in 3 months       Relevant Orders   HgB A1c   Microalbumin / creatinine urine ratio   Basic metabolic panel   Generalized anxiety disorder (Chronic)    No SI/HI Declined referral - she will check with her employer's resources Does not want to be on medication.       Other Visit Diagnoses     Perineal discomfort in female     Does not appear like yeast infection, but will give a trial of Diflucan Appears lichen-like in some areas - will get GYN input as well   Relevant Medications   fluconazole (DIFLUCAN) 150 MG tablet   Other Relevant Orders   Ambulatory referral to Obstetrics / Gynecology          Return in about 3 months (around 03/15/2023) for routine follow-up.    Clayborne Dana, NP

## 2022-12-14 ENCOUNTER — Telehealth: Payer: Self-pay | Admitting: Neurology

## 2022-12-14 LAB — HEMOGLOBIN A1C: Hgb A1c MFr Bld: 5.2 % (ref 4.6–6.5)

## 2022-12-14 LAB — BASIC METABOLIC PANEL
BUN: 12 mg/dL (ref 6–23)
CO2: 27 mEq/L (ref 19–32)
Calcium: 8.2 mg/dL — ABNORMAL LOW (ref 8.4–10.5)
Chloride: 108 mEq/L (ref 96–112)
Creatinine, Ser: 0.54 mg/dL (ref 0.40–1.20)
GFR: 105.08 mL/min (ref >60.00)
Glucose, Bld: 90 mg/dL (ref 70–99)
Potassium: 3.7 mEq/L (ref 3.5–5.1)
Sodium: 143 mEq/L (ref 135–145)

## 2022-12-14 LAB — MICROALBUMIN / CREATININE URINE RATIO
Creatinine,U: 109 mg/dL
Microalb Creat Ratio: 0.7 mg/g (ref 0.0–30.0)
Microalb, Ur: 0.8 mg/dL (ref 0.0–1.9)

## 2022-12-14 NOTE — Addendum Note (Signed)
Addended by: Hyman Hopes B on: 12/14/2022 05:27 PM   Modules accepted: Orders

## 2022-12-14 NOTE — Telephone Encounter (Signed)
[employer] FMLA forms completed and faxed to [phone] with confirmation received.

## 2023-01-17 ENCOUNTER — Ambulatory Visit: Payer: Managed Care, Other (non HMO) | Admitting: Family Medicine

## 2023-01-17 ENCOUNTER — Encounter: Payer: Self-pay | Admitting: Family Medicine

## 2023-01-17 VITALS — BP 126/76 | HR 80 | Ht 60.0 in | Wt 178.0 lb

## 2023-01-17 DIAGNOSIS — L700 Acne vulgaris: Secondary | ICD-10-CM

## 2023-01-17 MED ORDER — CLINDAMYCIN PHOS-BENZOYL PEROX 1-5 % EX GEL: Freq: Two times a day (BID) | CUTANEOUS | 1 refills | Status: DC

## 2023-01-17 NOTE — Progress Notes (Signed)
.t  Acute Office Visit  Subjective:     Patient ID: Alicia Pena, female    DOB: June 06, 1971, 52 y.o.   MRN: [id number]  Chief Complaint  Patient presents with   Skin Problem    HPI Patient is in today for acne.   Discussed the use of AI scribe software for clinical note transcription with the patient, who gave verbal consent to proceed.  History of Present Illness   The patient presents with worsening acne, described as a cycle of flare-ups and remissions. The condition has been particularly severe in the month of July. The patient has tried various skincare products, including toners and scrubs, but these have not been effective and have sometimes caused her skin to burn and ooze. The patient has also tried using alcohol to clean her skin, but this has not resulted in significant improvement.  The patient has a history of similar skin issues, with episodes occurring in her teens and thirties. The patient has also undergone a hysterectomy in the past. The patient is currently in her fifties and is surprised to be experiencing acne at this stage of life. The patient has been under significant stress recently, which may be contributing to the condition.  The patient's skin appears to be sensitive, reacting adversely to certain skincare products. The patient is unsure if she has used products containing benzoyl peroxide or salicylic acid, as she does not typically check the ingredients of the products she uses.            ROS All review of systems negative except what is listed in the HPI      Objective:    BP 126/76   Pulse 80   Ht 5' (1.524 m)   Wt 178 lb (80.7 kg)   SpO2 99%   BMI 34.76 kg/m    Physical Exam Vitals reviewed.  Constitutional:      General: She is not in acute distress.    Appearance: Normal appearance. She is obese. She is not ill-appearing.  Skin:    General: Skin is warm and dry.     Findings: Acne present.  Neurological:      Mental Status: She is alert and oriented to person, place, and time.  Psychiatric:        Mood and Affect: Mood normal.        Behavior: Behavior normal.        Thought Content: Thought content normal.        Judgment: Judgment normal.     No results found for any visits on 01/17/23.      Assessment & Plan:   Problem List Items Addressed This Visit   None Visit Diagnoses     Acne vulgaris    -  Primary Detailed instructions added to AVS regarding skin care routine and types of moisturizers and soaps to use or avoid Adding Benzaclin gel for now Reassess and make changes as needed Consider dermatology if failure to improve    Relevant Medications   clindamycin-benzoyl peroxide (BENZACLIN) gel       Meds ordered this encounter  Medications   clindamycin-benzoyl peroxide (BENZACLIN) gel    Sig: Apply topically 2 (two) times daily.    Dispense:  25 g    Refill:  1    Order Specific Question:   Supervising Provider    Answer:   Danise Edge A [[id number]]    Return if symptoms worsen or fail to improve.  Clayborne Dana, NP

## 2023-01-17 NOTE — Patient Instructions (Signed)
ACNE Recommendations  Products: Face Wash:  Use a gentle cleanser with, such as Cetaphil (generic version of this is fine) Moisturizer:  Use an "oil-free" moisturizer with SPF Prescription Cream(s):  Benzaclin gel I(antibiotic plus benzoyl peroxide Salicylic acid is a key ingredient in washes/creams for acne management  Morning: Wash face, then completely dry Apply Benzaclin gel, pea size amount that you massage into problem areas on the face. Apply Moisturizer to entire face  Bedtime: Wash face, then completely dry Apply Benzaclin gel, pea size amount that you massage into problem areas on the face.   Remember: Your acne will probably get worse before it gets better It takes at least 2 months for the medicines to start working Use oil free soaps and lotions; these can be over the counter or store-brand Don't use harsh scrubs or astringents, these can make skin irritation and acne worse Moisturize daily with oil free lotion because the acne medicines will dry your skin  Call your doctor if you have: Lots of skin dryness or redness that doesn't get better if you use a moisturizer or if you use the prescription cream or lotion every other day    Stop using the acne medicine immediately and see your doctor if you are or become pregnant or if you think you had an allergic reaction (itchy rash, difficulty breathing, nausea, vomiting) to your acne medication.  It will take 4 - 6 weeks before you see improvement, so don't get discouraged! Not every medicine works the same for everyone.  We will continue to pick and choose what works best for YOU. Avoid topical oils on your face such as cocoa butter, grease, other oils. Don't squeeze pimples. Exercise as able and try to reduce your stress level.

## 2023-02-01 ENCOUNTER — Ambulatory Visit: Payer: Managed Care, Other (non HMO) | Admitting: Obstetrics & Gynecology

## 2023-02-01 ENCOUNTER — Encounter: Payer: Self-pay | Admitting: Obstetrics & Gynecology

## 2023-02-01 ENCOUNTER — Other Ambulatory Visit (HOSPITAL_COMMUNITY)
Admission: RE | Admit: 2023-02-01 | Discharge: 2023-02-01 | Disposition: A | Discharge status: Home / Self Care | Payer: Managed Care, Other (non HMO) | Source: Ambulatory Visit | Attending: Obstetrics & Gynecology | Admitting: Obstetrics & Gynecology

## 2023-02-01 VITALS — BP 124/64 | HR 74

## 2023-02-01 DIAGNOSIS — N9089 Other specified noninflammatory disorders of vulva and perineum: Secondary | ICD-10-CM | POA: Diagnosis not present

## 2023-02-01 DIAGNOSIS — B372 Candidiasis of skin and nail: Secondary | ICD-10-CM | POA: Insufficient documentation

## 2023-02-01 MED ORDER — NYSTATIN 100000 UNIT/GM EX CREA: 1.0000 | TOPICAL_CREAM | Freq: Two times a day (BID) | CUTANEOUS | 2 refills | Status: DC

## 2023-02-01 MED ORDER — FLUCONAZOLE 150 MG PO TABS
150.0000 mg | ORAL_TABLET | ORAL | 3 refills | Status: DC
Start: 2023-02-01 — End: 2023-04-11

## 2023-02-01 NOTE — Progress Notes (Signed)
GYNECOLOGY OFFICE VISIT NOTE  History:   Alicia Pena is a 52 y.o. F with multiple medical issues including Type 2 DM, HTN, CP, history of breast cancer, s/p hysterectomy for benign indications here today for evaluation and management of perineal skin inflammation and irritation.  As per her chart notes and talking to the patient and her husband, this has been going on for the past several months. Skin was looking "grayish-white" for a while per husband, she is having a lot of perineal itching, especially at night - keeps her from sleeping. No discharge, no internal itching, all external. OTC treatment did not help She has been trying to keep it shaved, but that is not helping. She described washing area with a nicely scented honey washing solution.  She denies any abnormal vaginal discharge, bleeding, pelvic pain or other concerns.     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   History of breast cancer 05/11/2020   Hypocalcemia 05/11/2020   Vitamin D deficiency 05/11/2020   Morbid obesity (HCC) 05/03/2020   Lymphedema of upper extremity 05/04/2014   Hypertension 05/04/2014   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   Cerebral palsy (HCC) 02/03/2013   Edema of lower extremity 10/04/2012   Breast cancer (HCC) 03/21/2012   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) 02/16/2012    Past Medical History:  Diagnosis Date   Acute  deep vein thrombosis (DVT) of distal vein of lower extremity (HCC) 08/24/2020   Allergy    Anemia    Anxiety    does not like needles .Marland Kitchen    Arthritis    Breast cancer (HCC) 03/21/2012   left multifocal invasive, in situ ca,margins not involved,ER/PR=+,metastatic (1/1) node   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 - Encourage lifestyle modifications     Personal history of chemotherapy    Personal history of radiation therapy    Type 2 diabetes mellitus without complication, without long-term current use of insulin (HCC) 10/04/2021    Past Surgical History:  Procedure Laterality Date   ABDOMINAL HYSTERECTOMY  2006   w/o oophorectomy   AXILLARY LYMPH NODE DISSECTION  04/04/2012   Procedure: AXILLARY LYMPH NODE DISSECTION;  Surgeon: Emelia Loron, MD;  Location: [facility];  Service: General;  Laterality: Left;  Left Axillary lymph node dissection, Port a cath placement   BREAST BIOPSY  89/07/2011   left breast uoq bx=invasive ductal ca   breast biopsy bi lateral  02/15/12   left = invasive ductal, right breast= fibroadenoma,fibrocystic changes,no atypia,hyperlasia or malignancy identified   EYE SURGERY     as a child  LEG SURGERY     LLE   MASTECTOMY COMPLETE / SIMPLE W/ SENTINEL NODE BIOPSY Left 03/21/2012   left Dr.Matthew Bethann Humble PLACEMENT  04/04/2012   Procedure: INSERTION PORT-A-CATH;  Surgeon: Emelia Loron, MD;  Location: [facility];  Service: General;  Laterality: Right;    The following portions of the patient's history were reviewed and updated as appropriate: allergies,  current medications, past family history, past medical history, past social history, past surgical history and problem list.   Review of Systems:  Pertinent items noted in HPI and remainder of comprehensive ROS otherwise negative.  Physical Exam:  BP 124/64   Pulse 74  CONSTITUTIONAL: Well-developed, well-nourished female in no acute distress.  NEUROLOGIC: Alert and oriented to person, place, and time.  PSYCHIATRIC: Normal mood and affect. Normal behavior. Normal judgment and thought content. CARDIOVASCULAR: Normal heart rate noted RESPIRATORY: Effort and breath sounds normal, no problems with respiration noted ABDOMEN: No masses noted. No other overt distention noted.   PELVIC: Diffuse erythema noted involving the external genitalia, from mons pubis to bilateral inner things and perineum. No discrete lesions.  Concerning for widespread yeast skin infection.  Normal urethral meatus; normal appearing distal vaginal mucosa.  No abnormal discharge noted, testing sample obtained.  Performed in the presence of a chaperone     Assessment and Plan:     1. Yeast infection of the skin of the perineum 2. Vulvar irritation Will treat for presumptive yeast infection.  Will follow up test results and manage accordingly. She was told to call back if symptoms do not improve or worsen.  Proper vulvar hygiene emphasized: discussed avoidance of perfumed soaps, detergents, lotions and any type of douches; in addition to wearing cotton underwear and no underwear at night.   - Cervicovaginal ancillary only - fluconazole (DIFLUCAN) 150 MG tablet; Take 1 tablet (150 mg total) by mouth every 3 (three) days. For three doses  Dispense: 3 tablet; Refill: 3 - nystatin cream (MYCOSTATIN); Apply 1 Application topically 2 (two) times daily. Until symptoms resolve  Dispense: 60 g; Refill: 2  Routine preventative health maintenance measures emphasized. Please refer to After Visit Summary for other counseling  recommendations.   Return for any gynecologic concerns.    I spent 45 minutes dedicated to the care of this patient including pre-visit review of records, face to face time with the patient discussing her conditions and treatments and post visit orders.    Jaynie Collins, MD, FACOG Obstetrician & Gynecologist, [facility] for [employer], [facility]

## 2023-03-03 ENCOUNTER — Other Ambulatory Visit: Payer: Self-pay | Admitting: Family

## 2023-03-03 DIAGNOSIS — I10 Essential (primary) hypertension: Secondary | ICD-10-CM

## 2023-03-07 ENCOUNTER — Ambulatory Visit (INDEPENDENT_AMBULATORY_CARE_PROVIDER_SITE_OTHER): Payer: Managed Care, Other (non HMO) | Admitting: Urology

## 2023-03-07 ENCOUNTER — Encounter: Payer: Self-pay | Admitting: Urology

## 2023-03-07 VITALS — BP 120/83 | HR 86 | Wt 168.0 lb

## 2023-03-07 DIAGNOSIS — N3941 Urge incontinence: Secondary | ICD-10-CM | POA: Diagnosis not present

## 2023-03-07 DIAGNOSIS — Z87440 Personal history of urinary (tract) infections: Secondary | ICD-10-CM

## 2023-03-07 NOTE — Progress Notes (Signed)
Assessment: 1. Urge incontinence   2. History of UTI     Plan: She is doing well off of the [place]. Continue dietary management Return to office as needed.  Chief Complaint:  Chief Complaint  Patient presents with   Urinary Incontinence    History of Present Illness:  Alicia Pena is a 52 y.o. female who is seen for further evaluation of urinary incontinence.  She has noted incontinence symptoms for the past year.  Her symptoms worsened since she was diagnosed with diabetes in March 2023.  She reported symptoms of urinary frequency, urgency, nocturia 2-3 times, and urge incontinence.  She has had  large volume incontinence requiring pad use throughout the day.  She has had irritation of the vaginal area associated with her pad use.  She reported some occasional dysuria.  No problems emptying her bladder.  No gross hematuria or recent UTIs.  Her urinary symptoms and incontinence are more noticeable when she is working at home.  She does have occasional stress incontinence.  She has had some recent issues with diarrhea and had some fecal incontinence.  This is not a longstanding problem for her however.  No significant problems with constipation.  No medical therapy to date.  She has a history of cerebral palsy, diabetes, and breast cancer on Arimidex.  She was given a trial of Myrbetriq 50 mg daily in 12/23.  At her visit in 1/24, she reported some initial improvement in her symptoms with Myrbetriq.  She continued to have increased frequency, urgency, and incontinence while working from home.  Her symptoms were improved when she is away from her work duties.    At her visit in May 2024, she continued on Gemtesa 75 mg daily with significant improvement in her incontinence.  She reported decreased frequency, urgency, and incontinence.  She was able to leave her house without significant concern for incontinence episodes.  No dysuria or gross hematuria.  No UTI symptoms since  her visit in 1/24. Due to an abnormal appearing urinalysis, a urine culture was sent which showed 50-100 K group B strep. She was treated with amoxicillin x 7 days.  She returns today for scheduled follow-up.  She reports that she is doing well from a urinary standpoint.  She is not having any significant frequency, urgency, or incontinence at the present time.  She is no longer taking the Gemtesa as she discontinued this approximately 6 weeks ago.  She is managing her diet which has improved her voiding symptoms.  She was recently treated for vaginal candidiasis with Diflucan and nystatin cream.  No recent UTI symptoms.  No dysuria or gross hematuria.  Portions of the above documentation were copied from a prior visit for review purposes only.   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 - Encourage lifestyle modifications     Personal history of chemotherapy    Personal history of radiation therapy    Type 2 diabetes mellitus without complication, without long-term current use of insulin (HCC) 10/04/2021    Past Surgical History:  Past Surgical History:  Procedure Laterality Date   ABDOMINAL HYSTERECTOMY  2006   w/o oophorectomy   AXILLARY LYMPH NODE DISSECTION  04/04/2012   Procedure:  AXILLARY LYMPH NODE DISSECTION;  Surgeon: Emelia Loron, MD;  Location: [facility];  Service: General;  Laterality: Left;  Left Axillary lymph node dissection, Port a cath placement   BREAST BIOPSY  89/07/2011   left breast uoq bx=invasive ductal ca   breast biopsy bi lateral  02/15/12   left = invasive ductal, right breast= fibroadenoma,fibrocystic changes,no atypia,hyperlasia or malignancy identified   EYE SURGERY     as a child   LEG SURGERY     LLE   MASTECTOMY COMPLETE / SIMPLE W/ SENTINEL NODE BIOPSY Left 03/21/2012   left Dr.Matthew Dwain Sarna   PORTACATH PLACEMENT  04/04/2012   Procedure: INSERTION PORT-A-CATH;  Surgeon: Emelia Loron, MD;  Location: [facility];  Service: General;  Laterality: Right;    Allergies:  Allergies  Allergen Reactions   Duricef [Cefadroxil] Hives and Nausea Only    Family History:  Family History  Problem Relation Age of Onset   Learning disabilities Mother    Hypertension Mother    Depression Mother    Cancer Father 70       lung   Depression Sister    Diabetes Maternal Grandmother    Hyperlipidemia Maternal Grandmother    Hypertension Maternal Grandmother     Social History:  Social History   Tobacco Use   Smoking status: Never   Smokeless tobacco: Never  Vaping Use   Vaping status: Never Used  Substance Use Topics   Alcohol use: Never    Comment: used birth control pills 6 months,G)PO   Drug use: Never    ROS: Constitutional:  Negative for fever, chills, weight loss CV: Negative for chest pain, previous MI, hypertension Respiratory:  Negative for shortness of breath, wheezing, sleep apnea, frequent cough GI:  Negative for nausea, vomiting, bloody stool, GERD  Physical exam: BP 120/83   Pulse 86   Wt 168 lb (76.2 kg)   BMI 32.81 kg/m  GENERAL APPEARANCE:  Well appearing, well developed, well nourished, NAD HEENT:  Atraumatic, normocephalic, oropharynx clear NECK:  Supple without  lymphadenopathy or thyromegaly ABDOMEN:  Soft, non-tender, no masses EXTREMITIES:  Moves all extremities well, without clubbing, cyanosis, or edema NEUROLOGIC:  Alert and oriented x 3, in wheelchair, CN II-XII grossly intact MENTAL STATUS:  appropriate BACK:  Non-tender to palpation, No CVAT SKIN:  Warm, dry, and intact  Results: None

## 2023-03-07 NOTE — Addendum Note (Signed)
Addended by: Lizbeth Bark on: 03/07/2023 03:42 PM   Modules accepted: Orders

## 2023-03-29 ENCOUNTER — Telehealth: Payer: Self-pay | Admitting: Family Medicine

## 2023-03-29 NOTE — Telephone Encounter (Signed)
Pt's spouse dropped off FMLA paperwork to be filled out by provider. Pt would like to be called when document ready to pick up. Pt tel [phone]. Document put at front office tray under providers name.

## 2023-03-30 NOTE — Telephone Encounter (Signed)
Paperwork is for patient's spouse to cover him when he has to bring patient to appts. I called to see how much leave per month they are needing. Patient's husband unavailable. Patient does not know. She states she makes appts after work hours but sometimes he has mandatory overtime which messes up appt times. They will discuss and call us back. Will hold forms until we hear from them.

## 2023-04-02 NOTE — Telephone Encounter (Signed)
Pt called and stated that he needs "3 days out of the month". to be stated on the document. Please advise.

## 2023-04-04 NOTE — Telephone Encounter (Signed)
Richard (spouse) called stating that the party responsible for the FMLA had not received a particular page that had Lauralye and Richard's signatures on it. He is requesting to send again as they cannot start the process without it.

## 2023-04-04 NOTE — Telephone Encounter (Signed)
Forms completed. Faxed to Leave Management with confirmation received. LVM letting husband know. Copy for pick up at front desk.

## 2023-04-05 ENCOUNTER — Ambulatory Visit: Payer: Managed Care, Other (non HMO) | Admitting: Family Medicine

## 2023-04-05 ENCOUNTER — Encounter: Payer: Self-pay | Admitting: Family Medicine

## 2023-04-05 ENCOUNTER — Emergency Department (HOSPITAL_BASED_OUTPATIENT_CLINIC_OR_DEPARTMENT_OTHER)
Admission: EM | Admit: 2023-04-05 | Discharge: 2023-04-06 | Disposition: A | Discharge status: Home / Self Care | Payer: Managed Care, Other (non HMO) | Attending: Emergency Medicine | Admitting: Emergency Medicine

## 2023-04-05 ENCOUNTER — Other Ambulatory Visit: Payer: Self-pay

## 2023-04-05 ENCOUNTER — Encounter (HOSPITAL_BASED_OUTPATIENT_CLINIC_OR_DEPARTMENT_OTHER): Payer: Self-pay | Admitting: Emergency Medicine

## 2023-04-05 ENCOUNTER — Telehealth: Payer: Self-pay

## 2023-04-05 VITALS — BP 138/88 | Temp 98.2°F | Ht 60.0 in

## 2023-04-05 DIAGNOSIS — Z7901 Long term (current) use of anticoagulants: Secondary | ICD-10-CM | POA: Diagnosis not present

## 2023-04-05 DIAGNOSIS — R42 Dizziness and giddiness: Secondary | ICD-10-CM | POA: Insufficient documentation

## 2023-04-05 DIAGNOSIS — R202 Paresthesia of skin: Secondary | ICD-10-CM | POA: Diagnosis present

## 2023-04-05 DIAGNOSIS — R200 Anesthesia of skin: Secondary | ICD-10-CM

## 2023-04-05 DIAGNOSIS — R519 Headache, unspecified: Secondary | ICD-10-CM

## 2023-04-05 DIAGNOSIS — Z853 Personal history of malignant neoplasm of breast: Secondary | ICD-10-CM | POA: Insufficient documentation

## 2023-04-05 DIAGNOSIS — Z86718 Personal history of other venous thrombosis and embolism: Secondary | ICD-10-CM | POA: Diagnosis not present

## 2023-04-05 DIAGNOSIS — E119 Type 2 diabetes mellitus without complications: Secondary | ICD-10-CM | POA: Diagnosis not present

## 2023-04-05 DIAGNOSIS — Z20822 Contact with and (suspected) exposure to covid-19: Secondary | ICD-10-CM | POA: Diagnosis not present

## 2023-04-05 DIAGNOSIS — Z794 Long term (current) use of insulin: Secondary | ICD-10-CM | POA: Diagnosis not present

## 2023-04-05 LAB — CBC
HCT: 35.8 % — ABNORMAL LOW (ref 36.0–46.0)
Hemoglobin: 11.6 g/dL — ABNORMAL LOW (ref 12.0–15.0)
MCH: 29.3 pg (ref 26.0–34.0)
MCHC: 32.4 g/dL (ref 30.0–36.0)
MCV: 90.4 fL (ref 80.0–100.0)
Platelets: 197 10*3/uL (ref 150–400)
RBC: 3.96 MIL/uL (ref 3.87–5.11)
RDW: 17.5 % — ABNORMAL HIGH (ref 11.5–15.5)
WBC: 4.1 10*3/uL (ref 4.0–10.5)
nRBC: 0 % (ref 0.0–0.2)

## 2023-04-05 LAB — BASIC METABOLIC PANEL
Anion gap: 12 (ref 5–15)
BUN: 13 mg/dL (ref 6–20)
CO2: 25 mmol/L (ref 22–32)
Calcium: 8.6 mg/dL — ABNORMAL LOW (ref 8.9–10.3)
Chloride: 105 mmol/L (ref 98–111)
Creatinine, Ser: 0.56 mg/dL (ref 0.44–1.00)
GFR, Estimated: >60 mL/min (ref >60)
Glucose, Bld: 94 mg/dL (ref 70–99)
Potassium: 3.4 mmol/L — ABNORMAL LOW (ref 3.5–5.1)
Sodium: 142 mmol/L (ref 135–145)

## 2023-04-05 NOTE — Telephone Encounter (Signed)
Initial Comment Caller states she has not been feeling well. Caller states she feels very weak, her right leg is tingling and numb and she has been very confused for a few days. Caller states she has a welt on her bottom and when she sits on it it hurts. Translation No Nurse Assessment Nurse: Henri Medal, RN, Amy Date/Time (Eastern Time): 04/05/2023 12:24:43 PM Confirm and document reason for call. If symptomatic, describe symptoms. ---Caller states she is having trouble losing her train of thought or doesn't remember what she just said, which is not like her. Started gradually. Started feeling hot & threw up once on Monday. She has had numbness in her right leg that comes & goes. Thought it was from sleeping on it. Had a right hip replacement, & she doesn't normally sleep on that side. Over last several weeks, when she turns to her right side it goes numb or feels like it is asleep. When she got OOB to her W/C & then moves from W/C to office chair is when she felt the tingling. She has a sore on her bottom that hurts to sit on. It is in crease where thigh meets her bum. It burns when it gets urine on it. She feels tired all the time. Does the patient have any new or worsening symptoms? ---Yes Will a triage be completed? ---Yes Related visit to physician within the last 2 weeks? ---No Does the PT have any chronic conditions? (i.e. diabetes, asthma, this includes High risk factors for pregnancy, etc.) ---Yes List chronic conditions. ---DM, cancer, CP Is the patient pregnant or possibly pregnant? (Ask all females between the ages of 75-55) ---No PLEASE NOTE: All timestamps contained within this report are represented as [place] Standard Time. CONFIDENTIALTY NOTICE: This fax transmission is intended only for the addressee. It contains information that is legally privileged, confidential or otherwise protected from use or disclosure. If you are not the intended recipient, you are  strictly prohibited from reviewing, disclosing, copying using or disseminating any of this information or taking any action in reliance on or regarding this information. If you have received this fax in error, please notify us immediately by telephone so that we can arrange for its return to [place]. Phone: [phone], Toll-Free: [phone], Fax: [phone] Page: 2 of 2 Call Id: [id number] Nurse Assessment Is this a behavioral health or substance abuse call? ---No Guidelines Guideline Title Affirmed Question Affirmed Notes Nurse Date/Time Lamount Cohen Time) Neurologic Deficit [1] Numbness (i.e., loss of sensation) of the face, arm / hand, or leg / foot on one side of the body AND [2] gradual onset (e.g., days to weeks) AND [3] present now [place], RN, Amy 04/05/2023 12:31:51 PM Disp. Time Lamount Cohen Time) Disposition Final User 04/05/2023 12:21:53 PM Send to Urgent Queue Lonia Skinner 04/05/2023 12:37:30 PM See HCP within 4 Hours (or PCP triage) Yes [place], RN, Amy Final Disposition 04/05/2023 12:37:30 PM See HCP within 4 Hours (or PCP triage) Yes [place], RN, Amy Caller Disagree/Comply Comply Caller Understands Yes PreDisposition InappropriateToAsk Care Advice Given Per Guideline SEE HCP (OR PCP TRIAGE) WITHIN 4 HOURS: * IF OFFICE WILL BE OPEN: You need to be seen within the next 3 or 4 hours. Call your doctor (or NP/PA) now or as soon as the office opens. CALL BACK IF: * You become worse CARE ADVICE given per Neurologic Deficit (Adult) guideline. Referrals REFERRED TO PCP OFFICE

## 2023-04-05 NOTE — Telephone Encounter (Signed)
Appt today w/ Dr. Doreene Burke

## 2023-04-05 NOTE — ED Triage Notes (Signed)
PT to ER with c/o headache,dizziness and lightheadedness with left leg tingling starting today.  Pt reports she had vomiting on Monday.  Was sent from PCP for evaluation.

## 2023-04-05 NOTE — Progress Notes (Signed)
Established Patient Office Visit   Subjective:  Patient ID: Alicia Pena, female    DOB: 20-Mar-1971  Age: 52 y.o. MRN: [id number]  No chief complaint on file.   HPI Encounter Diagnoses  Name Primary?   Nonintractable headache, unspecified chronicity pattern, unspecified headache type Yes   Lightheadedness    Paresthesias    Presents with a 3 to 4-day history of intermittent waves of lightheadedness.  There is no spinning sensation and she has not lost consciousness.  She has experienced paresthesias in both of her legs from the knees down.  These seem to have localized to the left leg.  Today she is experienced a frontal headache.  She is also experiencing difficulty with mentation described as the inability to hold a train of thought and remember 1 thought to the next.  She denies fevers chills, arthralgias myalgias, respiratory tract symptoms abdominal pain or diarrhea.  History of breast cancer diagnosed in 2011 treated with mastectomy and adjuvant chemotherapy.  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  History of CP and is wheelchair-bound.  PET scan from base of skull to mid thighs with negative for active disease in July of this year.  She is accompanied by her husband today.   Review of Systems  Constitutional: Negative.   HENT: Negative.    Eyes:  Negative for blurred vision, discharge and redness.  Respiratory: Negative.    Cardiovascular: Negative.   Gastrointestinal:  Positive for nausea and vomiting. Negative for abdominal pain and diarrhea.  Genitourinary: Negative.   Musculoskeletal: Negative.  Negative for myalgias.  Skin:  Negative for rash.  Neurological:  Positive for tingling and headaches. Negative for loss of consciousness and weakness.  Endo/Heme/Allergies:  Negative for polydipsia.  Psychiatric/Behavioral:  Positive for memory loss.      Current Outpatient Medications:    anastrozole (ARIMIDEX) 1 MG tablet, Take 1  mg by mouth daily., Disp: , Rfl:    BAQSIMI TWO PACK 3 MG/DOSE POWD, SMARTSIG:1 Spray(s) Both Nares PRN, Disp: , Rfl:    clindamycin-benzoyl peroxide (BENZACLIN) gel, Apply topically 2 (two) times daily., Disp: 25 g, Rfl: 1   Continuous Glucose Sensor (FREESTYLE LIBRE 3 SENSOR) MISC, APPLY 1 SENSOR TO THE SKIN EVERY 14 DAYS, USE TO CHECK BLOOD GLUCOSE CONTINUOUSLY, Disp: 2 each, Rfl: 5   fluconazole (DIFLUCAN) 150 MG tablet, Take 1 tablet (150 mg total) by mouth every 3 (three) days. For three doses, Disp: 3 tablet, Rfl: 3   IBRANCE 75 MG tablet, Take by mouth., Disp: , Rfl:    insulin glargine (LANTUS SOLOSTAR) 100 UNIT/ML Solostar Pen, Inject 8 Units into the skin daily., Disp: 15 mL, Rfl: PRN   Insulin Pen Needle (PEN NEEDLES) 32G X 4 MM MISC, 1 each by Does not apply route daily., Disp: 100 each, Rfl: 4   KLOR-CON M10 10 MEQ tablet, Take by mouth., Disp: , Rfl:    losartan (COZAAR) 25 MG tablet, Take 1 tablet (25 mg total) by mouth daily., Disp: 90 tablet, Rfl: 1   Nystatin (GERHARDT'S BUTT CREAM) CREA, Apply 1 Application topically 2 (two) times daily., Disp: 1 each, Rfl: 2   nystatin cream (MYCOSTATIN), Apply 1 Application topically 2 (two) times daily. Until symptoms resolve, Disp: 60 g, Rfl: 2   ondansetron (ZOFRAN) 8 MG tablet, Take by mouth., Disp: , Rfl:    Vibegron (GEMTESA) 75 MG TABS, Take 1 tablet (75 mg total) by mouth daily., Disp: 30 tablet, Rfl: 11  XARELTO 20 MG TABS tablet, Take 20 mg by mouth daily., Disp: , Rfl:    Objective:     BP 138/88   Temp 98.2 F (36.8 C)   Ht 5' (1.524 m)   SpO2 98%   BMI 32.81 kg/m    Physical Exam Constitutional:      General: She is not in acute distress.    Appearance: Normal appearance. She is not ill-appearing, toxic-appearing or diaphoretic.  HENT:     Head: Normocephalic and atraumatic.     Right Ear: External ear normal.     Left Ear: External ear normal.     Mouth/Throat:     Mouth: Mucous membranes are moist.      Pharynx: Oropharynx is clear. No oropharyngeal exudate or posterior oropharyngeal erythema.  Eyes:     General: No scleral icterus.       Right eye: No discharge.        Left eye: No discharge.     Extraocular Movements: Extraocular movements intact.     Conjunctiva/sclera: Conjunctivae normal.     Comments: Pupils are pinpoint and nonreactive.  Cardiovascular:     Rate and Rhythm: Normal rate and regular rhythm.  Pulmonary:     Effort: Pulmonary effort is normal. No respiratory distress.     Breath sounds: Normal breath sounds.  Abdominal:     General: Bowel sounds are normal.  Musculoskeletal:     Cervical back: No rigidity or tenderness.  Skin:    General: Skin is warm and dry.  Neurological:     Mental Status: She is alert and oriented to person, place, and time.  Psychiatric:        Mood and Affect: Mood normal.        Behavior: Behavior normal.      No results found for any visits on 04/05/23.    The 10-year ASCVD risk score (Arnett DK, et al., 2019) is: 4.5%    Assessment & Plan:   Nonintractable headache, unspecified chronicity pattern, unspecified headache type  Lightheadedness  Paresthesias    Return Please go to emergency room.Mliss Sax, MD

## 2023-04-06 ENCOUNTER — Emergency Department (HOSPITAL_BASED_OUTPATIENT_CLINIC_OR_DEPARTMENT_OTHER): Payer: Managed Care, Other (non HMO)

## 2023-04-06 ENCOUNTER — Emergency Department (HOSPITAL_COMMUNITY): Payer: Managed Care, Other (non HMO)

## 2023-04-06 LAB — RESP PANEL BY RT-PCR (RSV, FLU A&B, COVID)  RVPGX2
Influenza A by PCR: NEGATIVE
Influenza B by PCR: NEGATIVE
Resp Syncytial Virus by PCR: NEGATIVE
SARS Coronavirus 2 by RT PCR: NEGATIVE

## 2023-04-06 LAB — URINALYSIS, ROUTINE W REFLEX MICROSCOPIC
Bilirubin Urine: NEGATIVE
Glucose, UA: NEGATIVE mg/dL
Hgb urine dipstick: NEGATIVE
Ketones, ur: NEGATIVE mg/dL
Leukocytes,Ua: NEGATIVE
Nitrite: NEGATIVE
Protein, ur: NEGATIVE mg/dL
Specific Gravity, Urine: 1.02 (ref 1.005–1.030)
pH: 5 (ref 5.0–8.0)

## 2023-04-06 LAB — CBG MONITORING, ED: Glucose-Capillary: 104 mg/dL — ABNORMAL HIGH (ref 70–99)

## 2023-04-06 MED ORDER — IOHEXOL 350 MG/ML SOLN
75.0000 mL | Freq: Once | INTRAVENOUS | Status: AC | PRN
  Administered 2023-04-06: 75 mL via INTRAVENOUS

## 2023-04-06 NOTE — ED Provider Notes (Signed)
[place] [facility] Provider Note   CSN: [id number] Arrival date & time: 04/05/23  1735     History  Chief Complaint  Patient presents with   Dizziness    Alicia Pena is a 52 y.o. female.  The history is provided by the patient.  Dizziness Quality:  Lightheadedness Severity:  Severe Onset quality:  Sudden Duration:  12 hours Timing:  Constant Progression:  Waxing and waning Chronicity:  New Context comment:  Moving from one chair to another for work Relieved by:  Nothing Ineffective treatments:  None tried Associated symptoms: no chest pain, no shortness of breath, no tinnitus and no vomiting   Associated symptoms comment:  RLE numbness  Risk factors: no hx of vertigo   Patient with a h/o DVT on Xarelto, Diabetes as well as cerebral palsy presents with lightheadedness and numbness in the RLE that was sudden onset this after at 12 pm.      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 - Encourage lifestyle modifications     Personal history of chemotherapy    Personal history of radiation therapy    Type 2 diabetes mellitus without complication, without long-term current use of insulin (HCC) 10/04/2021     Home Medications Prior to Admission  medications   Medication Sig Start Date End Date Taking? Authorizing Provider  anastrozole (ARIMIDEX) 1 MG tablet Take 1 mg by mouth daily. 07/01/21   [provider]  BAQSIMI TWO PACK 3 MG/DOSE POWD SMARTSIG:1 Spray(s) Both Nares PRN    [provider]  clindamycin-benzoyl peroxide (BENZACLIN) gel Apply topically 2 (two) times daily. 01/17/23   Clayborne Dana, NP  Continuous Glucose Sensor (FREESTYLE LIBRE 3 SENSOR) MISC APPLY 1 SENSOR TO THE SKIN EVERY 14 DAYS, USE TO CHECK BLOOD GLUCOSE CONTINUOUSLY 10/18/22   Clayborne Dana, NP  fluconazole (DIFLUCAN) 150 MG tablet Take 1 tablet (150 mg total) by mouth every 3 (three) days. For three doses 02/01/23   Anyanwu, Jethro Bastos, MD  IBRANCE 75 MG tablet Take by mouth. 09/01/21   [provider]  insulin glargine (LANTUS SOLOSTAR) 100 UNIT/ML Solostar Pen Inject 8 Units into the skin daily. 10/10/22   Clayborne Dana, NP  Insulin Pen Needle (PEN NEEDLES) 32G X 4 MM MISC 1 each by Does not apply route daily. 11/01/21   Clayborne Dana, NP  KLOR-CON M10 10 MEQ tablet Take by mouth. 08/16/22   [provider]  losartan (COZAAR) 25 MG tablet Take 1 tablet (25 mg total) by mouth daily. 11/27/22   Eulis Foster, FNP  Nystatin (GERHARDT'S BUTT CREAM) CREA Apply 1 Application topically 2 (two) times daily. 06/21/22   Clayborne Dana, NP  nystatin cream (  MYCOSTATIN) Apply 1 Application topically 2 (two) times daily. Until symptoms resolve 02/01/23   Anyanwu, Jethro Bastos, MD  ondansetron (ZOFRAN) 8 MG tablet Take by mouth. 09/02/20   [provider]  Vibegron (GEMTESA) 75 MG TABS Take 1 tablet (75 mg total) by mouth daily. 07/31/22   Stoneking, Danford Bad., MD  XARELTO 20 MG TABS tablet Take 20 mg by mouth daily. 09/15/21   [provider]      Allergies    Duricef [cefadroxil]    Review of Systems   Review of Systems  Constitutional:  Negative for fever.  HENT:  Negative for tinnitus.   Respiratory:  Negative for shortness of  breath, wheezing and stridor.   Cardiovascular:  Negative for chest pain.  Gastrointestinal:  Negative for vomiting.  Neurological:  Positive for light-headedness and numbness. Negative for facial asymmetry.  All other systems reviewed and are negative.   Physical Exam Updated Vital Signs BP (!) 143/93   Pulse 76   Temp 98.3 F (36.8 C)   Resp 17   Ht 5' (1.524 m)   Wt 77 kg   SpO2 99%   BMI 33.15 kg/m  Physical Exam Vitals and nursing note reviewed.  Constitutional:      General: She is not in acute distress.    Appearance: Normal appearance. She is well-developed.  HENT:     Head: Normocephalic and atraumatic.     Nose: Nose normal.     Mouth/Throat:     Mouth: Mucous membranes are moist.     Pharynx: Oropharynx is clear.  Eyes:     Extraocular Movements: Extraocular movements intact.     Pupils: Pupils are equal, round, and reactive to light.  Cardiovascular:     Rate and Rhythm: Normal rate and regular rhythm.     Pulses: Normal pulses.     Heart sounds: Normal heart sounds.  Pulmonary:     Effort: Pulmonary effort is normal. No respiratory distress.     Breath sounds: Normal breath sounds.  Abdominal:     General: Bowel sounds are normal. There is no distension.     Palpations: Abdomen is soft.     Tenderness: There is no abdominal tenderness. There is no guarding or rebound.  Genitourinary:    Vagina: No vaginal discharge.  Musculoskeletal:        General: Normal range of motion.     Cervical back: Normal range of motion and neck supple.  Skin:    General: Skin is warm and dry.     Capillary Refill: Capillary refill takes less than 2 seconds.     Findings: No erythema or rash.  Neurological:     General: No focal deficit present.     Mental Status: She is alert and oriented to person, place, and time.     Cranial Nerves: Cranial nerve deficit present.     Deep Tendon Reflexes: Reflexes normal.  Psychiatric:        Mood and Affect: Mood normal.      ED Results / Procedures / Treatments   Labs (all labs ordered are listed, but only abnormal results are displayed) Results for orders placed or performed during the hospital encounter of 04/05/23  Resp panel by RT-PCR (RSV, Flu A&B, Covid) Anterior Nasal Swab   Specimen: Anterior Nasal Swab  Result Value Ref Range   SARS Coronavirus 2 by RT PCR NEGATIVE NEGATIVE   Influenza A by PCR NEGATIVE NEGATIVE   Influenza B by PCR NEGATIVE  NEGATIVE   Resp Syncytial Virus by PCR NEGATIVE NEGATIVE  Basic metabolic panel  Result Value Ref Range   Sodium 142 135 - 145 mmol/L   Potassium 3.4 (L) 3.5 - 5.1 mmol/L   Chloride 105 98 - 111 mmol/L   CO2 25 22 - 32 mmol/L   Glucose, Bld 94 70 - 99 mg/dL   BUN 13 6 - 20 mg/dL   Creatinine, Ser 1.61 0.44 - 1.00 mg/dL   Calcium 8.6 (L) 8.9 - 10.3 mg/dL   GFR, Estimated >09 >60 mL/min   Anion gap 12 5 - 15  CBC  Result Value Ref Range   WBC 4.1 4.0 - 10.5 K/uL   RBC 3.96 3.87 - 5.11 MIL/uL   Hemoglobin 11.6 (L) 12.0 - 15.0 g/dL   HCT 45.4 (L) 09.8 - 11.9 %   MCV 90.4 80.0 - 100.0 fL   MCH 29.3 26.0 - 34.0 pg   MCHC 32.4 30.0 - 36.0 g/dL   RDW 14.7 (H) 82.9 - 56.2 %   Platelets 197 150 - 400 K/uL   nRBC 0.0 0.0 - 0.2 %  CBG monitoring, ED  Result Value Ref Range   Glucose-Capillary 104 (H) 70 - 99 mg/dL   CT ANGIO HEAD NECK W WO CM  Result Date: 04/06/2023 CLINICAL DATA:  Vertigo EXAM: CT ANGIOGRAPHY HEAD AND NECK WITH AND WITHOUT CONTRAST TECHNIQUE: Multidetector CT imaging of the head and neck was performed using the standard protocol during bolus administration of intravenous contrast. Multiplanar CT image reconstructions and MIPs were obtained to evaluate the vascular anatomy. Carotid stenosis measurements (when applicable) are obtained utilizing NASCET criteria, using the distal internal carotid diameter as the denominator. RADIATION DOSE REDUCTION: This exam was performed according to the departmental dose-optimization program which  includes automated exposure control, adjustment of the mA and/or kV according to patient size and/or use of iterative reconstruction technique. CONTRAST:  75mL OMNIPAQUE IOHEXOL 350 MG/ML SOLN COMPARISON:  None Available. FINDINGS: CT HEAD FINDINGS Brain: There is no mass, hemorrhage or extra-axial collection. The size and configuration of the ventricles and extra-axial CSF spaces are normal. There is hypoattenuation of the periventricular white matter, most commonly indicating chronic ischemic microangiopathy. Skull: The visualized skull base, calvarium and extracranial soft tissues are normal. Sinuses/Orbits: No fluid levels or advanced mucosal thickening of the visualized paranasal sinuses. No mastoid or middle ear effusion. The orbits are normal. CTA NECK FINDINGS SKELETON: No acute abnormality or high grade bony spinal canal stenosis. OTHER NECK: 2.5 cm right thyroid nodule. UPPER CHEST: No pneumothorax or pleural effusion. No nodules or masses. AORTIC ARCH: There is no calcific atherosclerosis of the aortic arch. Normal variant aortic arch branching pattern with the brachiocephalic and left common carotid arteries sharing a common origin. RIGHT CAROTID SYSTEM:  Normal LEFT CAROTID SYSTEM: Normal VERTEBRAL ARTERIES: Codominant configuration. There is no dissection, occlusion or flow-limiting stenosis to the skull base (V1-V3 segments). CTA HEAD FINDINGS POSTERIOR CIRCULATION: --Vertebral arteries: Normal V4 segments. --Inferior cerebellar arteries: Normal. --Basilar artery: Normal. --Superior cerebellar arteries: Normal. --Posterior cerebral arteries (PCA): Normal. ANTERIOR CIRCULATION: --Intracranial internal carotid arteries: Normal. --Anterior cerebral arteries (ACA): Normal. --Middle cerebral arteries (MCA): Normal. VENOUS SINUSES: As permitted by contrast timing, patent. ANATOMIC VARIANTS: Fetal origin of the right posterior cerebral artery. Review of the MIP images confirms the above findings. IMPRESSION:  1. No emergent large vessel occlusion or flow-limiting stenosis of the head or neck. 2. Incidental right thyroid nodule measuring 2.5 cm. Recommend non-emergent thyroid ultrasound. Reference: J  Am Coll Radiol. 2015 Feb;12(2): 143-50 Electronically Signed   By: Deatra Robinson M.D.   On: 04/06/2023 02:22    EKG EKG Interpretation Date/Time:  Thursday April 05 2023 18:11:23 EDT Ventricular Rate:  84 PR Interval:  151 QRS Duration:  98 QT Interval:  406 QTC Calculation: 480 R Axis:   62  Text Interpretation: Sinus rhythm Probable left ventricular hypertrophy Confirmed by Nicanor Alcon, Cephas Revard ([id number]) on 04/05/2023 11:31:08 PM  Radiology CT ANGIO HEAD NECK W WO CM  Result Date: 04/06/2023 CLINICAL DATA:  Vertigo EXAM: CT ANGIOGRAPHY HEAD AND NECK WITH AND WITHOUT CONTRAST TECHNIQUE: Multidetector CT imaging of the head and neck was performed using the standard protocol during bolus administration of intravenous contrast. Multiplanar CT image reconstructions and MIPs were obtained to evaluate the vascular anatomy. Carotid stenosis measurements (when applicable) are obtained utilizing NASCET criteria, using the distal internal carotid diameter as the denominator. RADIATION DOSE REDUCTION: This exam was performed according to the departmental dose-optimization program which includes automated exposure control, adjustment of the mA and/or kV according to patient size and/or use of iterative reconstruction technique. CONTRAST:  75mL OMNIPAQUE IOHEXOL 350 MG/ML SOLN COMPARISON:  None Available. FINDINGS: CT HEAD FINDINGS Brain: There is no mass, hemorrhage or extra-axial collection. The size and configuration of the ventricles and extra-axial CSF spaces are normal. There is hypoattenuation of the periventricular white matter, most commonly indicating chronic ischemic microangiopathy. Skull: The visualized skull base, calvarium and extracranial soft tissues are normal. Sinuses/Orbits: No fluid levels or advanced  mucosal thickening of the visualized paranasal sinuses. No mastoid or middle ear effusion. The orbits are normal. CTA NECK FINDINGS SKELETON: No acute abnormality or high grade bony spinal canal stenosis. OTHER NECK: 2.5 cm right thyroid nodule. UPPER CHEST: No pneumothorax or pleural effusion. No nodules or masses. AORTIC ARCH: There is no calcific atherosclerosis of the aortic arch. Normal variant aortic arch branching pattern with the brachiocephalic and left common carotid arteries sharing a common origin. RIGHT CAROTID SYSTEM:  Normal LEFT CAROTID SYSTEM: Normal VERTEBRAL ARTERIES: Codominant configuration. There is no dissection, occlusion or flow-limiting stenosis to the skull base (V1-V3 segments). CTA HEAD FINDINGS POSTERIOR CIRCULATION: --Vertebral arteries: Normal V4 segments. --Inferior cerebellar arteries: Normal. --Basilar artery: Normal. --Superior cerebellar arteries: Normal. --Posterior cerebral arteries (PCA): Normal. ANTERIOR CIRCULATION: --Intracranial internal carotid arteries: Normal. --Anterior cerebral arteries (ACA): Normal. --Middle cerebral arteries (MCA): Normal. VENOUS SINUSES: As permitted by contrast timing, patent. ANATOMIC VARIANTS: Fetal origin of the right posterior cerebral artery. Review of the MIP images confirms the above findings. IMPRESSION: 1. No emergent large vessel occlusion or flow-limiting stenosis of the head or neck. 2. Incidental right thyroid nodule measuring 2.5 cm. Recommend non-emergent thyroid ultrasound. Reference: J Am Coll Radiol. 2015 Feb;12(2): 143-50 Electronically Signed   By: Deatra Robinson M.D.   On: 04/06/2023 02:22    Procedures Procedures    Medications Ordered in ED Medications  iohexol (OMNIPAQUE) 350 MG/ML injection 75 mL (75 mLs Intravenous Contrast Given 04/06/23 0152)    ED Course/ Medical Decision Making/ A&P                                 Medical Decision Making Patient with lightheadedness and numbness since this afternoon saw  PMD who directed patient here.    Amount and/or Complexity of Data Reviewed Independent Historian: spouse    Details: See above  External Data Reviewed: notes.    Details: Previous  notes reviewed  Labs: ordered.    Details: Negative covid and flu.  Normal sodium 136, potassium slight low 3.4, normal creatinine 0.56.  normal white count 4.1, hemoglobin slight low 11.6, normal platelets  Radiology: ordered and independent interpretation performed.    Details: NO occlusion by me on CTA head ECG/medicine tests: ordered and independent interpretation performed. Decision-making details documented in ED Course. Discussion of management or test interpretation with external provider(s): Accepted by Dr. Adela Lank  Risk Prescription drug management. Risk Details: Differential is stroke, TIA, paresthesias.  WIll transfer to [facility] for MRI of the brain to exclude stroke.  Stable for discharge.      Final Clinical Impression(s) / ED Diagnoses Final diagnoses:  Numbness   Transfer to [facility] for MRI brain.  EMTALA complete  Rx / DC Orders ED Discharge Orders     None         Sharday Michl, MD 04/06/23 1610

## 2023-04-06 NOTE — ED Provider Notes (Signed)
MRI here negative.  No evidence of acute stroke or abnormalities.  Patient with right lower extremity tingling that occurred around 12 noon yesterday.  Now all resolved.  Patient's lightheadedness no true vertigo also resolved.  Discussed with patient possibility of this being a TIA.  But symptoms were very minimal.  Patient really wants to go home.  Will give a referral to outpatient neurology.  Have her follow back up with her primary care doctor.  No evidence of acute stroke or abnormality on MRI brain.  Patient's symptoms all resolved.   Vanetta Mulders, MD 04/06/23 [phone]

## 2023-04-06 NOTE — ED Notes (Signed)
Unable to do the 3 min orthostatic due to pt having balance issues

## 2023-04-06 NOTE — Discharge Instructions (Signed)
MRI without any acute findings to suggest stroke or other abnormalities.  Follow-up with your primary care doctor.  Return for any new or worse symptoms.  Referral information provided to neurology here locally as well.

## 2023-04-11 ENCOUNTER — Encounter: Payer: Self-pay | Admitting: Family Medicine

## 2023-04-11 ENCOUNTER — Ambulatory Visit: Payer: Managed Care, Other (non HMO) | Admitting: Family Medicine

## 2023-04-11 VITALS — BP 134/84 | HR 84 | Ht 60.0 in

## 2023-04-11 DIAGNOSIS — R5383 Other fatigue: Secondary | ICD-10-CM | POA: Diagnosis not present

## 2023-04-11 DIAGNOSIS — R350 Frequency of micturition: Secondary | ICD-10-CM

## 2023-04-11 DIAGNOSIS — N898 Other specified noninflammatory disorders of vagina: Secondary | ICD-10-CM

## 2023-04-11 DIAGNOSIS — E559 Vitamin D deficiency, unspecified: Secondary | ICD-10-CM

## 2023-04-11 DIAGNOSIS — R200 Anesthesia of skin: Secondary | ICD-10-CM

## 2023-04-11 MED ORDER — FLUCONAZOLE 150 MG PO TABS
150.0000 mg | ORAL_TABLET | ORAL | 0 refills | Status: DC
Start: 2023-04-11 — End: 2024-01-09

## 2023-04-11 NOTE — Progress Notes (Signed)
Acute Office Visit  Subjective:     Patient ID: Alicia Pena, female    DOB: January 14, 1971, 52 y.o.   MRN: [id number]  Chief Complaint  Patient presents with   Follow-up    Pt states she woke up this morning sweating, odor, diarrhea, fatigued  04/06/2023 pt states she felt lightheaded after sitting and both legs were tingling  Pt is still having trouble with a yeast infection, pt states she has a welt "on her bottom"    HPI Patient is in today for hospital follow-up.   Discussed the use of AI scribe software for clinical note transcription with the patient, who gave verbal consent to proceed.  History of Present Illness   The patient, with a history of diabetes, cancer, and recent hip replacement, presented for a follow-up after an emergency department (ED) visit due to severe headaches, dizziness, lightheadedness, muscle trouble, and numbness in the legs. The patient reported that the numbness, which was initially in both legs, later localized to the right leg, which had undergone hip replacement. The numbness and weakness have since resolved. Stroke was ruled out and she was told to follow-up with neuro outpatient.   The patient also reported a persistent yeast infection in the groin area, which has been causing significant discomfort and itching. Despite using prescribed creams (Nystatin) and maintaining good hygiene, the patient has not seen significant improvement. The patient also reported a sensitive area under right buttocks becomes irritated and burns during urination.  The patient has been experiencing extreme fatigue and frequent urination, particularly in the mornings before work. This has been occurring two to three times a week and has led to missed days of work. The patient also reported an unusual body odor described as a cross between sweet and musty.  The patient has been managing diabetes with insulin and has been monitoring blood glucose levels regularly. The  patient reported good control of blood glucose levels and has been working with an endocrinologist to adjust insulin doses.   The patient has a history of breast cancer and has been under regular surveillance for recurrence or spread. During the recent ED visit, a thyroid nodule was identified, which the patient believes may have been previously evaluated and found to be benign. She declined further [place] right now, but will check with her oncologist. We can also ask radiology to compare the images if possible. She will let me know.             ROS All review of systems negative except what is listed in the HPI      Objective:    BP 134/84   Pulse 84   Ht 5' (1.524 m)   SpO2 99%   BMI 33.15 kg/m    Physical Exam Vitals reviewed.  Constitutional:      Appearance: Normal appearance. She is obese.  HENT:     Head: Normocephalic and atraumatic.  Cardiovascular:     Rate and Rhythm: Normal rate and regular rhythm.     Pulses: Normal pulses.     Heart sounds: Normal heart sounds.  Pulmonary:     Effort: Pulmonary effort is normal.     Breath sounds: Normal breath sounds.  Genitourinary:    Comments: Perineal area with several areas of dry/flaking skin and mild erythema Small abrasion to right buttocks  Musculoskeletal:     Cervical back: Normal range of motion and neck supple.     Right lower leg: No edema.  Left lower leg: No edema.  Skin:    General: Skin is warm and dry.  Neurological:     General: No focal deficit present.     Mental Status: She is alert and oriented to person, place, and time. Mental status is at baseline.     Cranial Nerves: No cranial nerve deficit.  Psychiatric:        Thought Content: Thought content normal.        Judgment: Judgment normal.     No results found for any visits on 04/11/23.      Assessment & Plan:   Problem List Items Addressed This Visit       Active Problems   Hypocalcemia   Relevant Orders   VITAMIN D 25  Hydroxy (Vit-D Deficiency, Fractures)   Other Visit Diagnoses     Urinary frequency    -  Primary   Relevant Orders   Ambulatory referral to Urogynecology   Urinalysis w microscopic + reflex cultur   Basic metabolic panel   Vaginal itching       Relevant Medications   fluconazole (DIFLUCAN) 150 MG tablet   Other Relevant Orders   Ambulatory referral to Urogynecology   Fatigue, unspecified type       Relevant Orders   CBC with Differential/Platelet   IBC + Ferritin   B12 and Folate Panel   Numbness            Recurrent Yeast Infection Persistent perineal itching and irritation despite previous treatment. Discussed the use of barrier cream and probiotics to prevent recurrence. -Start probiotic daily. -Apply diaper cream to sore area on buttocks after cleaning after each bathroom use. -Continue antifungal cream as previously prescribed. -Start Fluconazole 1 pill weekly for 4 weeks.  Urinary Frequency Morning episodes of frequent urination causing distress and impacting work. No nocturia. Previous treatment for urinary frequency discontinued due to improvement. -Referral to Urogynecology for further evaluation and management.  Numbness and Weakness Recent episode of bilateral leg numbness and tingling, more pronounced in the right leg (post hip replacement). No recurrence since the episode. -ED referred to neuro. Patient to call to schedule.  -Aware of signs/symptoms requiring urgent evaluation.   Extreme Fatigue Ongoing issue, potentially exacerbated by itching and disrupted sleep. -Order comprehensive lab work to evaluate for potential causes. -Address itching and sleep disruption as part of management.      Meds ordered this encounter  Medications   fluconazole (DIFLUCAN) 150 MG tablet    Sig: Take 1 tablet (150 mg total) by mouth once a week. May repeat in 3 days if needed.    Dispense:  4 tablet    Refill:  0    Order Specific Question:   Supervising Provider     Answer:   Danise Edge A [[id number]]    Return if symptoms worsen or fail to improve, for (routine follow-up 3 months).  Clayborne Dana, NP

## 2023-04-12 ENCOUNTER — Encounter: Payer: Self-pay | Admitting: Neurology

## 2023-04-12 LAB — CBC WITH DIFFERENTIAL/PLATELET
Basophils Absolute: 0 10*3/uL (ref 0.0–0.1)
Basophils Relative: 1.1 % (ref 0.0–3.0)
Eosinophils Absolute: 0.1 10*3/uL (ref 0.0–0.7)
Eosinophils Relative: 1.7 % (ref 0.0–5.0)
HCT: 35.1 % — ABNORMAL LOW (ref 36.0–46.0)
Hemoglobin: 11.4 g/dL — ABNORMAL LOW (ref 12.0–15.0)
Lymphocytes Relative: 26.6 % (ref 12.0–46.0)
Lymphs Abs: 0.9 10*3/uL (ref 0.7–4.0)
MCHC: 32.5 g/dL (ref 30.0–36.0)
MCV: 91.1 fL (ref 78.0–100.0)
Monocytes Absolute: 0.3 10*3/uL (ref 0.1–1.0)
Monocytes Relative: 7.3 % (ref 3.0–12.0)
Neutro Abs: 2.2 10*3/uL (ref 1.4–7.7)
Neutrophils Relative %: 63.3 % (ref 43.0–77.0)
Platelets: 196 10*3/uL (ref 150.0–400.0)
RBC: 3.85 Mil/uL — ABNORMAL LOW (ref 3.87–5.11)
RDW: 19.8 % — ABNORMAL HIGH (ref 11.5–15.5)
WBC: 3.5 10*3/uL — ABNORMAL LOW (ref 4.0–10.5)

## 2023-04-12 LAB — IBC + FERRITIN
Ferritin: 12.4 ng/mL (ref 10.0–291.0)
Iron: 52 ug/dL (ref 42–145)
Saturation Ratios: 13 % — ABNORMAL LOW (ref 20.0–50.0)
TIBC: 399 ug/dL (ref 250.0–450.0)
Transferrin: 285 mg/dL (ref 212.0–360.0)

## 2023-04-12 LAB — BASIC METABOLIC PANEL
BUN: 15 mg/dL (ref 6–23)
CO2: 25 meq/L (ref 19–32)
Calcium: 8.3 mg/dL — ABNORMAL LOW (ref 8.4–10.5)
Chloride: 109 meq/L (ref 96–112)
Creatinine, Ser: 0.53 mg/dL (ref 0.40–1.20)
GFR: 105.31 mL/min (ref >60.00)
Glucose, Bld: 97 mg/dL (ref 70–99)
Potassium: 3.7 meq/L (ref 3.5–5.1)
Sodium: 143 meq/L (ref 135–145)

## 2023-04-12 LAB — B12 AND FOLATE PANEL
Folate: 7.5 ng/mL (ref >5.9)
Vitamin B-12: 190 pg/mL — ABNORMAL LOW (ref 211–911)

## 2023-04-12 LAB — VITAMIN D 25 HYDROXY (VIT D DEFICIENCY, FRACTURES): VITD: <7 ng/mL — ABNORMAL LOW (ref 30.00–100.00)

## 2023-04-12 NOTE — Addendum Note (Signed)
Addended by: Hyman Hopes B on: 04/12/2023 01:22 PM   Modules accepted: Orders

## 2023-04-13 ENCOUNTER — Ambulatory Visit: Payer: Managed Care, Other (non HMO)

## 2023-04-13 ENCOUNTER — Other Ambulatory Visit: Payer: Self-pay | Admitting: *Deleted

## 2023-04-13 DIAGNOSIS — E559 Vitamin D deficiency, unspecified: Secondary | ICD-10-CM

## 2023-04-13 LAB — MAGNESIUM: Magnesium: 1.9 mg/dL (ref 1.5–2.5)

## 2023-04-13 LAB — PHOSPHORUS: Phosphorus: 2.4 mg/dL (ref 2.3–4.6)

## 2023-04-13 MED ORDER — VITAMIN D (ERGOCALCIFEROL) 1.25 MG (50000 UNIT) PO CAPS
50000.0000 [IU] | ORAL_CAPSULE | ORAL | 0 refills | Status: DC
Start: 2023-04-13 — End: 2023-07-13

## 2023-04-13 NOTE — Progress Notes (Signed)
Urine with some white blood cells, but few bacteria, we will see what the culture says.  Iron saturation is low. Please start an over-the-counter iron supplement every other day if not already taking.  B12 is slightly low. Start taking over-the-counter B12 1,000 mcg daily Calcium and Vitamin D are low so I am sending in vitamin D supplement for you to take once a week, but I also want you to take 2,000 units over-the-counter every day. Adding some labs to further einvestigate. Please schedule a lab appointment.  3 month follow-up with me and we will recheck some labs at that time

## 2023-04-13 NOTE — Addendum Note (Signed)
Addended by: Hyman Hopes B on: 04/13/2023 01:27 PM   Modules accepted: Orders

## 2023-04-13 NOTE — Telephone Encounter (Signed)
Called and spoke to the [facility] company and  was verified that the Certification of Healthcare Provider Form was received and no other forms are needed.

## 2023-04-15 LAB — URINALYSIS W MICROSCOPIC + REFLEX CULTURE
Bilirubin Urine: NEGATIVE
Glucose, UA: NEGATIVE
Hgb urine dipstick: NEGATIVE
Ketones, ur: NEGATIVE
Nitrites, Initial: NEGATIVE
Specific Gravity, Urine: 1.027 (ref 1.001–1.035)
WBC, UA: >=60 /[HPF] — AB (ref 0–5)
pH: 5.5 (ref 5.0–8.0)

## 2023-04-15 LAB — URINE CULTURE

## 2023-04-15 LAB — EXTRA URINE SPECIMEN

## 2023-04-15 LAB — CULTURE INDICATED

## 2023-04-16 ENCOUNTER — Telehealth: Payer: Self-pay | Admitting: *Deleted

## 2023-04-16 DIAGNOSIS — R82998 Other abnormal findings in urine: Secondary | ICD-10-CM

## 2023-04-16 DIAGNOSIS — R7881 Bacteremia: Secondary | ICD-10-CM

## 2023-04-16 DIAGNOSIS — R350 Frequency of micturition: Secondary | ICD-10-CM

## 2023-04-16 NOTE — Telephone Encounter (Signed)
Received fax from [facility] stating they are unable to run Urine culture from the specimen they received on 04/11/23 but culture is still advised.  Attempted to reach pt by phone and received voicemail. Will send mychart message to see if pt can recollect specimen.

## 2023-04-16 NOTE — Telephone Encounter (Signed)
Notified pt by phone. She voices understanding and states she is dependant on husband for transportation and will try to come by tomorrow before 6pm to provide sample. Apologized to pt for the inconvenience. I have placed future ucx order and placed pt on lab schedule.

## 2023-04-17 ENCOUNTER — Other Ambulatory Visit (INDEPENDENT_AMBULATORY_CARE_PROVIDER_SITE_OTHER): Payer: Managed Care, Other (non HMO)

## 2023-04-17 DIAGNOSIS — R350 Frequency of micturition: Secondary | ICD-10-CM

## 2023-04-17 DIAGNOSIS — R7881 Bacteremia: Secondary | ICD-10-CM

## 2023-04-17 DIAGNOSIS — E559 Vitamin D deficiency, unspecified: Secondary | ICD-10-CM

## 2023-04-17 DIAGNOSIS — R82998 Other abnormal findings in urine: Secondary | ICD-10-CM

## 2023-04-18 LAB — URINE CULTURE
MICRO NUMBER:: 15596919
SPECIMEN QUALITY:: ADEQUATE

## 2023-04-18 LAB — PARATHYROID HORMONE, INTACT (NO CA): PTH: 101 pg/mL — ABNORMAL HIGH (ref 16–77)

## 2023-05-09 ENCOUNTER — Other Ambulatory Visit: Payer: Self-pay | Admitting: Family Medicine

## 2023-05-09 DIAGNOSIS — E119 Type 2 diabetes mellitus without complications: Secondary | ICD-10-CM

## 2023-05-15 ENCOUNTER — Ambulatory Visit: Payer: Managed Care, Other (non HMO) | Admitting: Neurology

## 2023-05-15 ENCOUNTER — Encounter: Payer: Self-pay | Admitting: Neurology

## 2023-07-01 ENCOUNTER — Other Ambulatory Visit: Payer: Self-pay | Admitting: Family Medicine

## 2023-07-01 DIAGNOSIS — F419 Anxiety disorder, unspecified: Secondary | ICD-10-CM

## 2023-07-10 DIAGNOSIS — G8929 Other chronic pain: Secondary | ICD-10-CM | POA: Insufficient documentation

## 2023-07-11 ENCOUNTER — Ambulatory Visit: Payer: Managed Care, Other (non HMO) | Admitting: Family Medicine

## 2023-07-11 ENCOUNTER — Encounter: Payer: Self-pay | Admitting: Family Medicine

## 2023-07-11 VITALS — BP 139/89 | HR 78 | Ht 60.0 in | Wt 185.0 lb

## 2023-07-11 DIAGNOSIS — E119 Type 2 diabetes mellitus without complications: Secondary | ICD-10-CM | POA: Diagnosis not present

## 2023-07-11 DIAGNOSIS — I10 Essential (primary) hypertension: Secondary | ICD-10-CM

## 2023-07-11 DIAGNOSIS — E611 Iron deficiency: Secondary | ICD-10-CM | POA: Insufficient documentation

## 2023-07-11 DIAGNOSIS — E538 Deficiency of other specified B group vitamins: Secondary | ICD-10-CM | POA: Diagnosis not present

## 2023-07-11 DIAGNOSIS — E041 Nontoxic single thyroid nodule: Secondary | ICD-10-CM

## 2023-07-11 DIAGNOSIS — Z794 Long term (current) use of insulin: Secondary | ICD-10-CM | POA: Diagnosis not present

## 2023-07-11 DIAGNOSIS — E559 Vitamin D deficiency, unspecified: Secondary | ICD-10-CM

## 2023-07-11 NOTE — Assessment & Plan Note (Signed)
 Supplement and monitor

## 2023-07-11 NOTE — Assessment & Plan Note (Signed)
 A1c previously stable. Recheck today  Continue current medications - Lantus 5 units daily  On ARB Discussed diet and exercise. Continue monitoring glucose. Following with endocrinology  F/u in 6 months

## 2023-07-11 NOTE — Assessment & Plan Note (Signed)
 Blood pressure is at goal for age and co-morbidities.   Recommendations: continue losartan  at current dose - BP goal <130/80 - monitor and log blood pressures at home - check around the same time each day in a relaxed setting - Limit salt to <2000 mg/day - Follow DASH eating plan (heart healthy diet) - limit alcohol to 2 standard drinks per day for men and 1 per day for women - avoid tobacco products - get at least 2 hours of regular aerobic exercise weekly Patient aware of signs/symptoms requiring further/urgent evaluation. Labs updated today.

## 2023-07-11 NOTE — Progress Notes (Signed)
 Established Patient Office Visit  Subjective   Patient ID: Alicia Pena, female    DOB: [birth year]  Age: 53 y.o. MRN: [id number]  CC: DM f/u    Patient is here for routine follow-up. Her husband is with her today for appointment.  Discussed the use of AI scribe software for clinical note transcription with the patient, who gave verbal consent to proceed.  History of Present Illness   The patient, with a history of diabetes, cancer, and orthopedic issues, presents with multiple complaints. Since October, she has been experiencing a decline in her health, with an unidentified illness causing her to miss work. In November, she developed a sore throat that lasted for two and a half weeks, which was treated by her oncologist.  The patient also reports severe pain in her right hip. The pain is so intense that it causes her leg to go numb. She received a steroid pack from an orthopedic specialist, which provided some relief. However, towards the end of the steroid course, she developed a new issue with her right shoulder. She sought care at an urgent care center, where an x-ray was performed and physical therapy was recommended. She also saw an orthopedic specialist who administered a corticosteroid injection into the shoulder, which provided temporary relief. They have ordered home health.  The patient expresses frustration with her current condition, as the combination of hip and shoulder pain severely limits her mobility. She reports difficulty getting out of bed, changing clothes, washing her hair, and transferring in and out of the tub without assistance. She is currently awaiting the initiation of home health services.  Regarding her diabetes management, the patient has been using a continuous glucose monitor (CGM) but had to remove it due to severe shoulder pain. She has been managing her diabetes with Lantus , recently reducing the dose from 8 units to 5 units. She also takes  losartan , vitamin D , B12, and iron supplements. The patient reports no new medications and denies needing refills at this time.       DIABETES: Patient reports she is still decent.  - Checking BG at home: yes, Alicia Pena - stopped wearing recently due to arm pain, needs to apply new sensor; previously averaging 121 mg/dL over the past 3 months - Medications: Lantus  5 (endo previously discontinued metformin  and Jardiance  as A1c is well controlled and she was frequently missing doses and having GI/GU side effects).  - Compliance: good  - Diet: low-carb, portion control (inconsistent lately - pasta, rice, etc) - Exercise: minimal - Denies symptoms of hypoglycemia, polyuria, polydipsia, numbness extremities, foot ulcers/trauma, wounds that are not healing, medication side effects  Lab Results  Component Value Date   HGBA1C 5.2 12/13/2022    Hypertension: - Medications: losartan  25 mg daily - Compliance: good - Checking BP at home: rarely - Denies any SOB, recurrent headaches, CP, vision changes, LE edema, dizziness, palpitations, or medication side effects. - Stressors: work, health   Vitamin D  deficiency: - Previously treated with high dose weekly supplement along with daily recommended supplement    B12/Iron deficiency: - daily supplements       ROS All review of systems negative except what is listed in the HPI    Objective:     BP 139/89   Pulse 78   Ht 5' (1.524 m)   Wt 185 lb (83.9 kg)   SpO2 98%   BMI 36.13 kg/m    Physical Exam Vitals reviewed.  Constitutional:  Appearance: Normal appearance. She is obese.  HENT:     Head: Normocephalic and atraumatic.  Cardiovascular:     Rate and Rhythm: Normal rate and regular rhythm.     Pulses: Normal pulses.     Heart sounds: Normal heart sounds.  Pulmonary:     Effort: Pulmonary effort is normal.     Breath sounds: Normal breath sounds.  Musculoskeletal:     Cervical back: Normal range of motion and neck  supple.     Right lower leg: No edema.     Left lower leg: No edema.  Skin:    General: Skin is warm and dry.  Neurological:     General: No focal deficit present.     Mental Status: She is alert and oriented to person, place, and time. Mental status is at baseline.  Psychiatric:        Thought Content: Thought content normal.        Judgment: Judgment normal.      No results found for any visits on 07/11/23.    The 10-year ASCVD risk score (Arnett DK, et al., 2019) is: 5%    Assessment & Plan:    Problem List Items Addressed This Visit       Active Problems   Hypertension (Chronic)   Blood pressure is at goal for age and co-morbidities.   Recommendations: continue losartan  at current dose - BP goal <130/80 - monitor and log blood pressures at home - check around the same time each day in a relaxed setting - Limit salt to <2000 mg/day - Follow DASH eating plan (heart healthy diet) - limit alcohol to 2 standard drinks per day for men and 1 per day for women - avoid tobacco products - get at least 2 hours of regular aerobic exercise weekly Patient aware of signs/symptoms requiring further/urgent evaluation. Labs updated today.       Relevant Orders   TSH   T4, free   Type 2 diabetes mellitus without complication, without long-term current use of insulin  (HCC) - Primary (Chronic)   A1c previously stable. Recheck today  Continue current medications - Lantus  5 units daily  On ARB Discussed diet and exercise. Continue monitoring glucose. Following with endocrinology  F/u in 6 months       Relevant Orders   Hemoglobin A1c   CBC with Differential/Platelet   Comprehensive metabolic panel   Vitamin D  deficiency   Completed high dose supplement. Continue daily supplement Labs today        Relevant Orders   VITAMIN D  25 Hydroxy (Vit-D Deficiency, Fractures)   PTH, intact (no Ca)   Phosphorus   B12 deficiency   Supplement and monitor      Relevant Orders    CBC with Differential/Platelet   B12 and Folate Panel   Iron deficiency   Supplement and monitor      Relevant Orders   CBC with Differential/Platelet   IBC + Ferritin   Thyroid  nodule   Labs today. Will order thyroid  US .      Relevant Orders   TSH   T4, free      Return in about 6 months (around 01/08/2024) for routine follow-up.    Waddell KATHEE Mon, NP

## 2023-07-11 NOTE — Assessment & Plan Note (Signed)
 Completed high dose supplement. Continue daily supplement Labs today

## 2023-07-11 NOTE — Assessment & Plan Note (Signed)
 Labs today. Will order thyroid US.

## 2023-07-12 LAB — COMPREHENSIVE METABOLIC PANEL
ALT: 13 U/L (ref 0–35)
AST: 10 U/L (ref 0–37)
Albumin: 4 g/dL (ref 3.5–5.2)
Alkaline Phosphatase: 53 U/L (ref 39–117)
BUN: 15 mg/dL (ref 6–23)
CO2: 27 meq/L (ref 19–32)
Calcium: 8.3 mg/dL — ABNORMAL LOW (ref 8.4–10.5)
Chloride: 106 meq/L (ref 96–112)
Creatinine, Ser: 0.56 mg/dL (ref 0.40–1.20)
GFR: 103.75 mL/min (ref >60.00)
Glucose, Bld: 88 mg/dL (ref 70–99)
Potassium: 3.9 meq/L (ref 3.5–5.1)
Sodium: 142 meq/L (ref 135–145)
Total Bilirubin: 1.2 mg/dL (ref 0.2–1.2)
Total Protein: 6.7 g/dL (ref 6.0–8.3)

## 2023-07-12 LAB — CBC WITH DIFFERENTIAL/PLATELET
Basophils Absolute: 0 10*3/uL (ref 0.0–0.1)
Basophils Relative: 0.9 % (ref 0.0–3.0)
Eosinophils Absolute: 0.1 10*3/uL (ref 0.0–0.7)
Eosinophils Relative: 2.5 % (ref 0.0–5.0)
HCT: 35.8 % — ABNORMAL LOW (ref 36.0–46.0)
Hemoglobin: 11.8 g/dL — ABNORMAL LOW (ref 12.0–15.0)
Lymphocytes Relative: 25.5 % (ref 12.0–46.0)
Lymphs Abs: 1 10*3/uL (ref 0.7–4.0)
MCHC: 33 g/dL (ref 30.0–36.0)
MCV: 92.7 fL (ref 78.0–100.0)
Monocytes Absolute: 0.3 10*3/uL (ref 0.1–1.0)
Monocytes Relative: 6.7 % (ref 3.0–12.0)
Neutro Abs: 2.5 10*3/uL (ref 1.4–7.7)
Neutrophils Relative %: 64.4 % (ref 43.0–77.0)
Platelets: 260 10*3/uL (ref 150.0–400.0)
RBC: 3.86 Mil/uL — ABNORMAL LOW (ref 3.87–5.11)
RDW: 20.6 % — ABNORMAL HIGH (ref 11.5–15.5)
WBC: 3.8 10*3/uL — ABNORMAL LOW (ref 4.0–10.5)

## 2023-07-12 LAB — VITAMIN D 25 HYDROXY (VIT D DEFICIENCY, FRACTURES): VITD: 20.83 ng/mL — ABNORMAL LOW (ref 30.00–100.00)

## 2023-07-12 LAB — IBC + FERRITIN
Ferritin: 10.2 ng/mL (ref 10.0–291.0)
Iron: 31 ug/dL — ABNORMAL LOW (ref 42–145)
Saturation Ratios: 7.2 % — ABNORMAL LOW (ref 20.0–50.0)
TIBC: 429.8 ug/dL (ref 250.0–450.0)
Transferrin: 307 mg/dL (ref 212.0–360.0)

## 2023-07-12 LAB — B12 AND FOLATE PANEL
Folate: 10 ng/mL (ref >5.9)
Vitamin B-12: 202 pg/mL — ABNORMAL LOW (ref 211–911)

## 2023-07-12 LAB — PHOSPHORUS: Phosphorus: 2.9 mg/dL (ref 2.3–4.6)

## 2023-07-12 LAB — T4, FREE: Free T4: 1.22 ng/dL (ref 0.60–1.60)

## 2023-07-12 LAB — HEMOGLOBIN A1C: Hgb A1c MFr Bld: 5.7 % (ref 4.6–6.5)

## 2023-07-12 LAB — TSH: TSH: 0.43 u[IU]/mL (ref 0.35–5.50)

## 2023-07-12 LAB — PARATHYROID HORMONE, INTACT (NO CA): PTH: 215 pg/mL — ABNORMAL HIGH (ref 16–77)

## 2023-07-13 MED ORDER — VITAMIN D (ERGOCALCIFEROL) 1.25 MG (50000 UNIT) PO CAPS
50000.0000 [IU] | ORAL_CAPSULE | ORAL | 0 refills | Status: DC
Start: 2023-07-13 — End: 2024-01-17

## 2023-07-13 NOTE — Progress Notes (Signed)
**  send a copy to [facility]inologist   B12 is low - increase your daily B12 supplement to 2,000 mcg daily Iron is low - continue iron supplement. Sending a copy to your oncology/hematology provider as you may need iron infusions if oral replacement isn't adequate.  Vitamin D  is still low, but improving - continue daily supplement, but adding high-dose supplement for another 12 weeks.  Calcium and PTH are still abnormal - we will continue to trend as we correct your Vitamin D , but I will also let your endocrinologist take a look and further investigate if needed. Based on a CT I see you had done a few months ago, we need to get an ultrasound of your thyroid  to check a nodule. I will order this.   Follow-up with me in 3 months

## 2023-07-13 NOTE — Addendum Note (Signed)
 Addended by: Hyman Hopes B on: 07/13/2023 01:25 PM   Modules accepted: Orders

## 2023-07-23 ENCOUNTER — Ambulatory Visit (HOSPITAL_BASED_OUTPATIENT_CLINIC_OR_DEPARTMENT_OTHER): Payer: Managed Care, Other (non HMO)

## 2023-09-07 ENCOUNTER — Ambulatory Visit: Payer: Self-pay | Admitting: Family Medicine

## 2023-09-07 ENCOUNTER — Telehealth: Admitting: Nurse Practitioner

## 2023-09-07 DIAGNOSIS — T148XXA Other injury of unspecified body region, initial encounter: Secondary | ICD-10-CM | POA: Diagnosis not present

## 2023-09-07 MED ORDER — MUPIROCIN 2 % EX OINT: 1.0000 | TOPICAL_OINTMENT | Freq: Two times a day (BID) | CUTANEOUS | 1 refills | Status: DC

## 2023-09-07 NOTE — Progress Notes (Signed)
   Established Patient Office Visit  An audio/visual tele-health visit was completed today for this patient. I connected with  Nathen May Tucker-Magnante on 09/07/23 utilizing audio/visual technology and verified that I am speaking with the correct person using two identifiers. The patient was located at their home, and I was located at the office of [facility] at [facility] during the encounter. I discussed the limitations of evaluation and management by telemedicine. The patient expressed understanding and agreed to proceed.    Subjective   Patient ID: RAMEEN GOHLKE, female    DOB: [birth year]  Age: 53 y.o. MRN: [id number]  Chief Complaint  Patient presents with   Blister   Erupted to her inner thigh about 2 days ago after she rubbed her leg on a ramp while trying get into her home. The blister has deroofed and is draining a thin yellow drainage. Painful to touch.  She has been applying Neosporin and a over-the-counter topical pain reliever with gauze and tape.  Unfortunately it seems that she is allergic to the gauze because she is having new blisters forming where tape is touching her skin.  No fever.  Per chart review last Tdap administered in 2021.     ROS: see HPI    Objective:     There were no vitals taken for this visit.   Physical Exam Comprehensive physical exam not completed today as office visit was conducted remotely.  Video clarity is somewhat limited but I am able to see her wound, it appears to be about 1 to 1-1/2 inches in diameter, surrounding skin appears within normal limits no edema noted.  Patient was alert and oriented, and appeared to have appropriate judgment.   No results found for any visits on 09/07/23.    The 10-year ASCVD risk score (Arnett DK, et al., 2019) is: 5.9%    Assessment & Plan:   Problem List Items Addressed This Visit       Other   Blister - Primary   Acute Likely due to friction rub Tdap up-to-date Per  shared decision making we discussed topical versus oral therapy as she does have a history of type 2 diabetes as well as cancer making her likely immunocompromise.  Due to her mobility impairment secondary to cerebral palsy she would prefer to try topical treatment first.  Will order mupirocin ointment that she can apply twice a day, recommend use of nonadherent gauze, and use of Coban to prevent further irritation of skin exposed to any tape.  She reports understanding.  Will also refer to wound clinic in the event wound does not heal in a timely fashion.  She was encouraged to follow-up in person next week if symptoms worsen or if she experiences signs of infection such as swelling, thick purulent drainage, fever, worsening redness.  She reports her understanding.      Relevant Medications   mupirocin ointment (BACTROBAN) 2 %   Other Relevant Orders   Ambulatory referral to Wound Clinic    Return if symptoms worsen or fail to improve.    Elenore Paddy, NP

## 2023-09-07 NOTE — Assessment & Plan Note (Signed)
 Acute Likely due to friction rub Tdap up-to-date Per shared decision making we discussed topical versus oral therapy as she does have a history of type 2 diabetes as well as cancer making her likely immunocompromise.  Due to her mobility impairment secondary to cerebral palsy she would prefer to try topical treatment first.  Will order mupirocin ointment that she can apply twice a day, recommend use of nonadherent gauze, and use of Coban to prevent further irritation of skin exposed to any tape.  She reports understanding.  Will also refer to wound clinic in the event wound does not heal in a timely fashion.  She was encouraged to follow-up in person next week if symptoms worsen or if she experiences signs of infection such as swelling, thick purulent drainage, fever, worsening redness.  She reports her understanding.

## 2023-09-07 NOTE — Telephone Encounter (Signed)
 Noted.

## 2023-09-07 NOTE — Telephone Encounter (Signed)
 Chief Complaint: wound, "burn" Symptoms: right inner thigh wound with itching Frequency: since Wednesday Pertinent Negatives: Patient denies fever, pus or drainage Disposition: [] ED /[] Urgent Care (no appt availability in office) / [x] Appointment(In office/virtual)/ []  [facility]/ [] Home Care/ [] Refused Recommended Disposition /[] [facility]/ []  Follow-up with PCP Additional Notes: Patient calling in stating she has a burn to right inner thigh about 3 inches from genitals. She states she does not know what burned her and states she thinks it may be the heater she uses while at work, although that is on her left side. She states Wednesday she was in a chair lift and it caused friction that caused the burn blister to rupture. She states she was able to place a clean bandage to the wound to try to reduce rubbing/friction. Patient states she was experiencing itching and states she thinks she is allergic to the tape they were using for the bandage. She states they have a friend who suggested since she is diabetic she needs an antibiotic. Patient requested to send in pictures of the wound and have PCP prescribe an antibiotic. Advised patient to schedule an office visit or virtual appointment. Patient states she does not have transportation until her husband gets off work and that she is unable to remove the bandage herself  either. Scheduled virtual appointment for patient his evening when her husband will be home and patient is stating she is concerned about a virtual appointment due to showing the area of the burn. Reconfirmed with patient that the wound is not involving her buttocks or genitals and to keep those areas covered during the appt.  Copied from CRM [id number]. Topic: Clinical - Red Word Triage >> Sep 07, 2023  7:42 AM Alphonzo Lemmings O wrote: [facility] that prompted transfer to Nurse Triage: have a burn on right leg on the inside of my thigh . Wants antibiotics and has pictures Reason  for Disposition  Minor thermal burn  Answer Assessment - Initial Assessment Questions 1. ONSET: "When did it happen?" If happened < 3 hours ago, ask: "Did you apply cold water?" If not, give First Aid Advice immediately.      Wednesday.  2. LOCATION: "Where is the burn located?"      Right inner thigh about 3 inches from genitals.  3. BURN SIZE: "How large is the burn?"  The palm is roughly 1% of the total body surface area (BSA).     The width of a dime but the length is about an 1.5 inch.  4. SEVERITY OF THE BURN: "Are there any blisters?"      Yes, patient states it ruptured on Wednesday.  5. MECHANISM: "Tell me how it happened."     Patient is unsure, poor historian. She states she is unsure how it happened.  6. PAIN: "Are you having any pain?" "How bad is the pain?" (Scale 1-10; or mild, moderate, severe)   - MILD (1-3): doesn't interfere with normal activities    - MODERATE (4-7): interferes with normal activities or awakens from sleep    - SEVERE (8-10): excruciating pain, unable to do any normal activities      Mild.  7. INHALATION INJURY: "Were you exposed to any smoke or fumes?" If Yes, ask: "Do you have any cough or difficulty breathing?"     Denies any fire or flames.  8. OTHER SYMPTOMS: "Do you have any other symptoms?" (e.g., headache, nausea)     Itching, 3 hives at dressing site (patient  states she thinks this is due to the tapes she used for the bandage).  Protocols used: Lawer[facility]

## 2023-09-10 ENCOUNTER — Encounter: Payer: Self-pay | Admitting: Nurse Practitioner

## 2023-09-10 ENCOUNTER — Telehealth: Payer: Self-pay | Admitting: Neurology

## 2023-09-10 ENCOUNTER — Encounter: Payer: Self-pay | Admitting: Family Medicine

## 2023-09-10 ENCOUNTER — Ambulatory Visit: Payer: Self-pay | Admitting: Family Medicine

## 2023-09-10 DIAGNOSIS — T148XXA Other injury of unspecified body region, initial encounter: Secondary | ICD-10-CM

## 2023-09-10 MED ORDER — DOXYCYCLINE HYCLATE 100 MG PO TABS: 100.0000 mg | ORAL_TABLET | Freq: Two times a day (BID) | ORAL | 0 refills | Status: DC

## 2023-09-10 MED ORDER — DOXYCYCLINE HYCLATE 100 MG PO TABS: 100.0000 mg | ORAL_TABLET | Freq: Two times a day (BID) | ORAL | 0 refills | Status: AC

## 2023-09-10 NOTE — Addendum Note (Signed)
 Addended by: Hyman Hopes B on: 09/10/2023 05:00 PM   Modules accepted: Orders

## 2023-09-10 NOTE — Telephone Encounter (Signed)
 Copied from CRM [id number]. Topic: Clinical - Medication Question >> Sep 10, 2023  8:18 AM [place] E wrote: Reason for CRM: Patient had a blister on Friday, and got prescribed an antibiotic and she just has some questions to make sure her wound is healing. Today she is experiencing some yellow pus that is coming out of her wound, patient just wants to know more about the stages of healing.  The patient reported a blister that bursted Wednesday and she sought treatment Friday.  She has been applying Bactroban twice daily since Saturday. However, the blister is more painful and it now has a yellow discharge. She is unsure if her husband has been applying the antibiotic to wound correctly. She has been keeping it covered but is concerned about the worsening pain.  She requested an oral antibiotic to resolve the possible infection.  She stated that a wound care referral was being placed and inquired about the status.  She requested the prescription be sent in today so that she can start it as soon as possible.  She reported some bleeding from the wound Saturday.  She was unable to visualize it while on the triage call.  She had to end the call due to a work call.  She stated that she was unable to call out of work today. Reason for Disposition  [1] Taking antibiotic > 72 hours (3 days) AND [2] symptoms (other than fever) not improved  Answer Assessment - Initial Assessment Questions 1. INFECTION: "What infection is the antibiotic being given for?"     Blister on right thigh  2. ANTIBIOTIC: "What antibiotic are you taking" "How many times per day?"     Bactroban twice daily  3. DURATION: "When was the antibiotic started?"     Saturday  4. MAIN CONCERN OR SYMPTOM:  "What is your main concern right now?"     Yellow pus  5. BETTER-SAME-WORSE: "Are you getting better, staying the same, or getting worse compared to when you first started the antibiotics?" If getting worse, ask: "In what way?"      Worse  6.  FEVER: "Do you have a fever?" If Yes, ask: "What is your temperature, how was it measured, and when did it start?"     No  7. SYMPTOMS: "Are there any other symptoms you're concerned about?" If Yes, ask: "When did it start?"     Yellow discharge  More painful than Friday  8. FOLLOW-UP APPOINTMENT: "Do you have a follow-up appointment with your doctor?" Start the process of referring to wound care  Protocols used: Infection on Antibiotic Follow-up Call-[facility]

## 2023-09-10 NOTE — Telephone Encounter (Signed)
 Patient had a telehealth appt on Friday. Per their note:   Acute Likely due to friction rub Tdap up-to-date Per shared decision making we discussed topical versus oral therapy as she does have a history of type 2 diabetes as well as cancer making her likely immunocompromise.  Due to her mobility impairment secondary to cerebral palsy she would prefer to try topical treatment first.  Will order mupirocin ointment that she can apply twice a day, recommend use of nonadherent gauze, and use of Coban to prevent further irritation of skin exposed to any tape.  She reports understanding.  Will also refer to wound clinic in the event wound does not heal in a timely fashion.  She was encouraged to follow-up in person next week if symptoms worsen or if she experiences signs of infection such as swelling, thick purulent drainage, fever, worsening redness.  She reports her understanding.

## 2023-09-10 NOTE — Telephone Encounter (Signed)
 Patient saw another provider and she did not want antibiotics. Sounds like she does want them now. Please advise.    Copied from CRM [id number]. Topic: Clinical - Prescription Issue >> Sep 10, 2023  4:40 PM Eunice Blase wrote: Reason for CRM: Pt called stated that per her and providers conversation on Friday if treatment is not working let provider know to call in oral antibiotics send to: [id number] - [place], [address] - [address] AT [facility] [address] [place] [address] Phone: [phone] Fax: [phone]. Pt says condition is getting worst and needs oral antibiotics. Please call pt at  [phone].

## 2023-11-07 ENCOUNTER — Encounter: Payer: Self-pay | Admitting: Neurology

## 2023-11-07 NOTE — Telephone Encounter (Signed)
 Forms completed, copy sent to scan, copy to hold. Faxed forms with confirmation received. Patient made aware.

## 2024-01-09 ENCOUNTER — Encounter: Payer: Self-pay | Admitting: Family Medicine

## 2024-01-09 ENCOUNTER — Ambulatory Visit: Payer: Managed Care, Other (non HMO) | Admitting: Family Medicine

## 2024-01-09 VITALS — BP 127/74 | HR 98 | Ht 60.0 in | Wt 191.0 lb

## 2024-01-09 DIAGNOSIS — E538 Deficiency of other specified B group vitamins: Secondary | ICD-10-CM

## 2024-01-09 DIAGNOSIS — E119 Type 2 diabetes mellitus without complications: Secondary | ICD-10-CM

## 2024-01-09 DIAGNOSIS — Z794 Long term (current) use of insulin: Secondary | ICD-10-CM

## 2024-01-09 DIAGNOSIS — I10 Essential (primary) hypertension: Secondary | ICD-10-CM

## 2024-01-09 DIAGNOSIS — E559 Vitamin D deficiency, unspecified: Secondary | ICD-10-CM | POA: Diagnosis not present

## 2024-01-09 DIAGNOSIS — E611 Iron deficiency: Secondary | ICD-10-CM | POA: Diagnosis not present

## 2024-01-09 MED ORDER — FREESTYLE LIBRE 3 SENSOR MISC: 5 refills | Status: AC

## 2024-01-09 NOTE — Assessment & Plan Note (Signed)
 Inconsistent supplement use. Labs today.

## 2024-01-09 NOTE — Assessment & Plan Note (Signed)
 Blood pressure is at goal for age and co-morbidities.   Recommendations: continue losartan  at current dose - can half tablet if getting low readings at home, has not been monitoring or taking meds consistently - BP goal <130/80 - monitor and log blood pressures at home - check around the same time each day in a relaxed setting - Limit salt to <2000 mg/day - Follow DASH eating plan (heart healthy diet) - limit alcohol to 2 standard drinks per day for men and 1 per day for women - avoid tobacco products - get at least 2 hours of regular aerobic exercise weekly Patient aware of signs/symptoms requiring further/urgent evaluation. Labs updated today.

## 2024-01-09 NOTE — Assessment & Plan Note (Signed)
 A1c previously stable. Recheck today  Continue current medications - Lantus  5 units daily  On ARB Discussed diet and exercise. Continue monitoring glucose. Following with endocrinology - sees them next week F/u in 6 months

## 2024-01-09 NOTE — Progress Notes (Signed)
 Established Patient Office Visit  Subjective   Patient ID: Alicia Pena, female    DOB: [birth year]  Age: 53 y.o. MRN: [id number]  CC: chronic disease f/u    Patient is here for routine follow-up. Her husband is with her today for appointment.   Discussed the use of AI scribe software for clinical note transcription with the patient, who gave verbal consent to proceed.  History of Present Illness Alicia Pena is a 53 year old female with diabetes who presents with concerns about stress-related urinary incontinence and elevated blood sugar levels.  She experiences stress-related urinary incontinence, which she attributes to elevated cortisol levels due to ongoing stressors. She was previously on medication for this issue about a year ago, which resolved the symptoms at that time. Not sure she wants to go back to urology, she is more concerned about checking cortisol levels.  She has been experiencing fluctuations in her blood sugar levels- it was elevated during a doctor's visit last month. She attributes this to dietary changes during a recent vacation. She is currently using 5 units of Lantus  once a day and has been monitoring her blood sugar levels with CGM, but inconsistent use lately for various reasons.  She notes increased puffiness in ankles. She wonders if this could be due to sitting longer in the chair, needing more liquids, or a combination of factors. She has not been using compression socks.  She has been inconsistent with her vitamin D , B12, and iron supplements, which have been low in the past. She has not been consistently taking her losartan  25 mg for blood pressure.   She has been trying to improve her diet by eating vegetables first and incorporating olive oil before consuming carbohydrates.    DIABETES: - Checking BG at home: yes, Libre - none this month, but had been doing well  - Medications: Lantus  5 units daily (endo previously  discontinued metformin  and Jardiance  as A1c is well controlled and she was frequently missing doses and having GI/GU side effects).  - Compliance: good  - Diet: low-carb, portion control  - Exercise: minimal - Denies symptoms of hypoglycemia, polyuria, polydipsia, numbness extremities, foot ulcers/trauma, wounds that are not healing, medication side effects  Lab Results  Component Value Date   HGBA1C 5.7 07/11/2023    Hypertension: - Medications: losartan  25 mg daily - Compliance: not consistent  - Checking BP at home: rarely - Denies any SOB, recurrent headaches, CP, vision changes, LE edema, dizziness, palpitations, or medication side effects. - Stressors: work, health   Vitamin D  deficiency: - Previously treated with high dose weekly supplement along with daily recommended supplement  - inconsistent supplementation recently   B12/Iron deficiency: - daily supplements, inconsistent lately     CMP and CBC recently done at specialist 12/26/23: Cr 0.55 BUN 16 eGFR >90 Glucose 107 Na 141 K 3.7  WBC 4.69 Hgb 11.8    ROS All review of systems negative except what is listed in the HPI    Objective:     BP 127/74   Pulse 98   Ht 5' (1.524 m)   Wt 191 lb (86.6 kg) Comment: per patient report  SpO2 99%   BMI 37.30 kg/m    Physical Exam Vitals reviewed.  Constitutional:      Appearance: Normal appearance. She is obese.  HENT:     Head: Normocephalic and atraumatic.  Cardiovascular:     Rate and Rhythm: Normal rate and regular rhythm.  Pulses: Normal pulses.     Heart sounds: Normal heart sounds.  Pulmonary:     Effort: Pulmonary effort is normal.     Breath sounds: Normal breath sounds.  Musculoskeletal:     Cervical back: Normal range of motion and neck supple.     Right lower leg: No edema.     Left lower leg: No edema.     Comments: Right ankle mildly puffy but no pitting edema, no erythema or duskiness   Skin:    General: Skin is warm and dry.   Neurological:     General: No focal deficit present.     Mental Status: She is alert and oriented to person, place, and time. Mental status is at baseline.  Psychiatric:        Thought Content: Thought content normal.        Judgment: Judgment normal.      No results found for any visits on 01/09/24.    The 10-year ASCVD risk score (Arnett DK, et al., 2019) is: 4.2%    Assessment & Plan:    Problem List Items Addressed This Visit       Active Problems   Hypertension (Chronic)   Blood pressure is at goal for age and co-morbidities.   Recommendations: continue losartan  at current dose - can half tablet if getting low readings at home, has not been monitoring or taking meds consistently - BP goal <130/80 - monitor and log blood pressures at home - check around the same time each day in a relaxed setting - Limit salt to <2000 mg/day - Follow DASH eating plan (heart healthy diet) - limit alcohol to 2 standard drinks per day for men and 1 per day for women - avoid tobacco products - get at least 2 hours of regular aerobic exercise weekly Patient aware of signs/symptoms requiring further/urgent evaluation. Labs updated today.       Relevant Orders   TSH   Type 2 diabetes mellitus without complication, without long-term current use of insulin  (HCC) - Primary (Chronic)   A1c previously stable. Recheck today  Continue current medications - Lantus  5 units daily  On ARB Discussed diet and exercise. Continue monitoring glucose. Following with endocrinology - sees them next week F/u in 6 months       Relevant Medications   Continuous Glucose Sensor (FREESTYLE LIBRE 3 SENSOR) MISC   Other Relevant Orders   Hemoglobin A1c   Microalbumin / creatinine urine ratio   Vitamin D  deficiency   Inconsistent supplement use. Labs today.       Relevant Orders   VITAMIN D  25 Hydroxy (Vit-D Deficiency, Fractures)   B12 deficiency   Inconsistent supplement use. Labs today.       Relevant Orders   B12 and Folate Panel   Iron deficiency   Inconsistent supplement use. Labs today.      Relevant Orders   IBC + Ferritin    She is interested in getting cortisol levels checked. Discussed with patient. She will mention to her endocrinologist at appointment next week.       Return in about 6 months (around 07/11/2024) for chronic disease management, physical.    Waddell KATHEE Mon, NP

## 2024-01-10 LAB — TSH: TSH: 0.76 u[IU]/mL (ref 0.35–5.50)

## 2024-01-10 LAB — B12 AND FOLATE PANEL
Folate: 17.8 ng/mL (ref >5.9)
Vitamin B-12: 239 pg/mL (ref 211–911)

## 2024-01-10 LAB — HEMOGLOBIN A1C: Hgb A1c MFr Bld: 6 % (ref 4.6–6.5)

## 2024-01-10 LAB — IBC + FERRITIN
Ferritin: 14.9 ng/mL (ref 10.0–291.0)
Iron: 55 ug/dL (ref 42–145)
Saturation Ratios: 13.6 % — ABNORMAL LOW (ref 20.0–50.0)
TIBC: 404.6 ug/dL (ref 250.0–450.0)
Transferrin: 289 mg/dL (ref 212.0–360.0)

## 2024-01-10 LAB — VITAMIN D 25 HYDROXY (VIT D DEFICIENCY, FRACTURES): VITD: 15.78 ng/mL — ABNORMAL LOW (ref 30.00–100.00)

## 2024-01-17 ENCOUNTER — Other Ambulatory Visit: Payer: Self-pay | Admitting: Family Medicine

## 2024-01-17 ENCOUNTER — Other Ambulatory Visit

## 2024-01-17 ENCOUNTER — Ambulatory Visit: Payer: Self-pay | Admitting: Family Medicine

## 2024-01-17 DIAGNOSIS — E559 Vitamin D deficiency, unspecified: Secondary | ICD-10-CM

## 2024-01-17 MED ORDER — VITAMIN D (ERGOCALCIFEROL) 1.25 MG (50000 UNIT) PO CAPS: 50000.0000 [IU] | ORAL_CAPSULE | ORAL | 0 refills | Status: DC

## 2024-01-18 LAB — MICROALBUMIN / CREATININE URINE RATIO
Creatinine,U: 147.4 mg/dL
Microalb Creat Ratio: 21 mg/g (ref 0.0–30.0)
Microalb, Ur: 3.1 mg/dL — ABNORMAL HIGH (ref 0.0–1.9)

## 2024-01-21 ENCOUNTER — Telehealth: Payer: Self-pay | Admitting: Neurology

## 2024-01-21 NOTE — Telephone Encounter (Signed)
 Copied from CRM [id number]. Topic: General - Other >> Jan 21, 2024 11:52 AM Thersia BROCKS wrote: Reason for CRM: Patient called in regarding the lab results that NP Waddell Mon has referred her to Endocrinology, patient stated that she will be getting an cortisol test, wanted to know if there was anyway she could get the labs done at this office because the provider at the Endo stated she could, I informed her per [facility] that We cannot schedule labs from another office other than [facility]. Patient stated she would like for NP Waddell Mon to give her a callback regarding this on what she needs to do

## 2024-01-21 NOTE — Telephone Encounter (Signed)
 Patient made aware that this would need done in a stand alone lab, that our lab is only available for our providers. She will contact their office back about which lab they can send her to that can accept their orders.

## 2024-01-21 NOTE — Telephone Encounter (Signed)
 Do you want me to tell her labs need to be done at [facility] or did you want to reorder Cortisone and Dexamethasone  levels to have her do this at our lab?

## 2024-02-05 NOTE — Telephone Encounter (Signed)
 We did not draw Cortisol test on patient.   Copied from CRM #[id number]. Topic: Clinical - Lab/Test Results >> Feb 05, 2024  9:13 AM [name] I wrote: Reason for CRM: Patient would like a call to discuss  results from cortisol test from 7/18, Patient would like a call between 1pm and 2pm, if possible.

## 2024-02-05 NOTE — Telephone Encounter (Signed)
 Looks like patient has called and left messages with Endocrinology to call with results. Since they are the provider who ordered this testing and patient has also reached out to them, will let Endocrinology manage their test results/communication.

## 2024-06-10 ENCOUNTER — Other Ambulatory Visit: Payer: Self-pay

## 2024-06-10 DIAGNOSIS — I10 Essential (primary) hypertension: Secondary | ICD-10-CM

## 2024-06-10 MED ORDER — LOSARTAN POTASSIUM 25 MG PO TABS: 25.0000 mg | ORAL_TABLET | Freq: Every day | ORAL | 0 refills | Status: AC

## 2024-07-16 ENCOUNTER — Ambulatory Visit: Admitting: Family Medicine

## 2024-07-18 NOTE — Progress Notes (Signed)
 "  Established Patient Office Visit   Subjective:  Patient ID: Alicia Pena, female    DOB: September 07, 1970  Age: 54 y.o. MRN: [id number]  CC:  Chief Complaint  Patient presents with   Medical Management of Chronic Issues      HPI Alicia Pena is here for chronic disease follow up.   Discussed the use of AI scribe software for clinical note transcription with the patient, who gave verbal consent to proceed.  History of Present Illness Alicia Pena is a 54 year old female with diabetes and hypertension who presents with fatigue and urinary incontinence.  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 She feels exhausted and unable to care for herself, often needing assistance with basic self-care activities like bathing.  She has urinary incontinence, particularly at work, which has been persistent. She manages this by using cushions and pads, but her skin has become sore and red. She has not seen a urologist recently for this issue.  She describes significant mental health struggles, expressing feelings of wanting to be dead, though she clarifies she does not want to die but rather not live in her current state. She feels overwhelmed by her inability to care for herself and the mental load of managing her life and household responsibilities.  She also notes difficulty with mobility and balance, stating she cannot stand up straight or balance herself.  She has diabetes, managed with insulin  at 8 units per day. Her weekly average blood sugar has been 155, though it was 96 at the time of the visit.  She is on losartan  25 mg daily for hypertension, though she is unsure if she is currently taking it as her partner manages her medications.        DIABETES: - Checking BG at home: yes, Libre   - Medications: Lantus  8 units daily  (endo previously discontinued metformin  and Jardiance  as A1c is well controlled and she was frequently missing doses and having GI/GU side effects).  - Compliance: good  - Diet: low-carb, portion control  - Exercise: minimal - Denies symptoms of hypoglycemia, polyuria, polydipsia, numbness extremities, foot ulcers/trauma, wounds that are not healing, medication side effects  Lab Results  Component Value Date   HGBA1C 6.0 01/09/2024   Wt Readings from Last 3 Encounters:  07/21/24 195 lb (88.5 kg)  01/09/24 191 lb (86.6 kg)  07/11/23 185 lb (83.9 kg)     Hypertension: - Medications: losartan  25 mg daily - Compliance: not consistent  - Checking BP at home: rarely - Denies any SOB, recurrent headaches, CP, vision changes, LE edema, dizziness, palpitations, or medication side effects. - Stressors: work, health  BP Readings from Last 3 Encounters:  07/21/24 (!) 149/87  01/09/24 127/74  07/11/23 139/89     Vitamin D  deficiency: - Previously treated with high dose weekly supplement along with daily recommended supplement. Not taking currently.  Lab Results  Component Value Date   VD25OH 15.78 (L) 01/09/2024     B12/Iron deficiency: - daily supplements, inconsistent lately    Latest Ref Rng & Units 07/11/2023    4:10 PM 04/11/2023    3:52 PM 04/05/2023    6:09 PM  CBC  WBC 4.0 - 10.5 K/uL 3.8  3.5  4.1   Hemoglobin 12.0 - 15.0 g/dL 88.1  88.5  88.3   Hematocrit 36.0 - 46.0 % 35.8  35.1  35.8   Platelets 150.0 - 400.0 K/uL 260.0  196.0  197    Lab Results  Component Value Date   VITAMINB12 239 01/09/2024   FOLATE 17.8 01/09/2024    Breast cancer, history of DVT: - Following with [facility] - Medications: Ibrance, Xarelto, anastrozole        07/21/2024    4:06 PM 09/04/2022    5:14 PM 05/16/2022    5:43 PM  PHQ9 SCORE ONLY  PHQ-9 Total Score 9 9  0      Data saved with a previous flowsheet row definition      07/21/2024    4:09 PM 09/04/2022    5:15 PM  GAD 7  : Generalized Anxiety Score  Nervous, Anxious, on Edge 0 2   Control/stop worrying 0 1   Worry too much - different things 0 1   Trouble relaxing 0 0   Restless 0 0   Easily annoyed or irritable 3 2   Afraid - awful might happen 0 0   Total GAD 7 Score 3 6  Anxiety Difficulty Very difficult Extremely difficult     Data saved with a previous flowsheet row definition      Past Medical History:  Diagnosis Date   Acute deep vein thrombosis (DVT) of distal vein of lower extremity (HCC) 08/24/2020   Allergy    Anemia    Anxiety    does not like needles .SABRA    Arthritis    Breast cancer (HCC) 03/21/2012   left multifocal invasive, in situ ca,margins not involved,ER/PR=+,metastatic (1/1) node   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 - Encourage lifestyle modifications     Personal history of chemotherapy    Personal history of radiation therapy    Type 2 diabetes mellitus without complication, without long-term current use of insulin  (HCC) 10/04/2021    Past Surgical History:  Procedure Laterality Date   ABDOMINAL HYSTERECTOMY  2006   w/o oophorectomy   AXILLARY LYMPH NODE DISSECTION  04/04/2012   Procedure: AXILLARY LYMPH NODE DISSECTION;  Surgeon: Donnice Bury, MD;  Location: [facility];  Service: General;  Laterality: Left;  Left Axillary lymph node dissection, Port a cath placement   BREAST BIOPSY  89/07/2011   left breast uoq bx=invasive ductal ca   breast biopsy bi lateral  02/15/12   left = invasive ductal, right breast= fibroadenoma,fibrocystic changes,no atypia,hyperlasia or malignancy identified   EYE SURGERY      as a child   LEG SURGERY     LLE   MASTECTOMY COMPLETE / SIMPLE W/ SENTINEL NODE BIOPSY Left 03/21/2012   left Dr.Matthew Bury   PORTACATH PLACEMENT  04/04/2012   Procedure: INSERTION PORT-A-CATH;  Surgeon: Donnice Bury, MD;  Location: [facility];  Service: General;  Laterality: Right;    Family History  Problem Relation Age of Onset   Learning disabilities Mother    Hypertension Mother    Depression Mother    Cancer Father 16       lung   Depression Sister    Diabetes Maternal Grandmother    Hyperlipidemia Maternal Grandmother  Hypertension Maternal Grandmother     Social History   Socioeconomic History   Marital status: Married    Spouse name: Not on file   Number of children: Not on file   Years of education: Not on file   Highest education level: Not on file  Occupational History   Not on file  Tobacco Use   Smoking status: Never   Smokeless tobacco: Never  Vaping Use   Vaping status: Never Used  Substance and Sexual Activity   Alcohol use: Never    Comment: used birth control pills 6 months,G)PO   Drug use: Never   Sexual activity: Yes  Other Topics Concern   Not on file  Social History Narrative   Not on file   Social Drivers of Health   Tobacco Use: Low Risk (07/21/2024)   Patient History    Smoking Tobacco Use: Never    Smokeless Tobacco Use: Never    Passive Exposure: Not on file  Financial Resource Strain: Not on file  Food Insecurity: Low Risk (01/18/2024)   Received from [facility]   Epic    Within the past 12 months, you worried that your food would run out before you got money to buy more: Never true    Within the past 12 months, the food you bought just didn't last and you didn't have money to get more. : Never true  Transportation Needs: No Transportation Needs (01/18/2024)   Received from [facility]    In the past 12 months, has lack of reliable transportation kept you from medical  appointments, meetings, work or from getting things needed for daily living? : No  Physical Activity: Not on file  Stress: Not on file  Social Connections: Not on file  Intimate Partner Violence: Not on file  Depression (PHQ2-9): Medium Risk (07/21/2024)   Depression (PHQ2-9)    PHQ-2 Score: 9  Alcohol Screen: Not on file  Housing: Low Risk (01/18/2024)   Received from [facility]   Epic    What is your living situation today?: I have a steady place to live    Think about the place you live. Do you have problems with any of the following? Choose all that apply:: None/None on this list  Utilities: Low Risk (01/18/2024)   Received from [facility]   Utilities    In the past 12 months has the electric, gas, oil, or water company threatened to shut off services in your home? : No  Health Literacy: Not on file    ROS All ROS negative except what is listed in the HPI.   Objective:   Today's Vitals: BP (!) 149/87   Pulse 98   Ht 5' (1.524 m)   Wt 195 lb (88.5 kg)   SpO2 96%   BMI 38.08 kg/m   Physical Exam Vitals reviewed.  Constitutional:      Appearance: Normal appearance. She is obese.  Cardiovascular:     Rate and Rhythm: Normal rate and regular rhythm.     Heart sounds: Normal heart sounds.  Pulmonary:     Effort: Pulmonary effort is normal.     Breath sounds: Normal breath sounds.  Musculoskeletal:     Comments: wheelchair  Skin:    General: Skin is warm and dry.  Neurological:     Mental Status: She is alert and oriented to person, place, and time.  Psychiatric:        Mood and Affect: Mood normal.  Behavior: Behavior normal.        Thought Content: Thought content normal.        Judgment: Judgment normal.     Comments: Frustrated, tearful        Assessment & Plan:   Problem List Items Addressed This Visit       Active Problems   Hypertension - Primary (Chronic)   Blood pressure is NOT at goal for age and co-morbidities.   Recommendations:  continue losartan  - discussed compliance; nurse visit recheck in 2 weeks  - BP goal <130/80 - monitor and log blood pressures at home - check around the same time each day in a relaxed setting - Limit salt to <2000 mg/day - Follow DASH eating plan (heart healthy diet) - limit alcohol to 2 standard drinks per day for men and 1 per day for women - avoid tobacco products - get at least 2 hours of regular aerobic exercise weekly Patient aware of signs/symptoms requiring further/urgent evaluation. Labs updated today.       Relevant Orders   Basic metabolic panel with GFR   Type 2 diabetes mellitus without complication, without long-term current use of insulin  (HCC) (Chronic)   A1c previously stable. Recheck today  Continue current medications - Lantus  8 units daily  Discussed diet and exercise. Continue monitoring glucose.       Relevant Medications   insulin  glargine (LANTUS  SOLOSTAR) 100 UNIT/ML Solostar Pen   Other Relevant Orders   Hemoglobin A1c   Microalbumin / creatinine urine ratio   AMB Referral VBCI Care Management   Urinary incontinence (Chronic)   Experiencing incontinence and frequency, especially during work. No recent urology follow-up. - Check urine - Referred to urology for further evaluation and management.      Relevant Orders   Ambulatory referral to Urology   Ambulatory referral to Home Health   Urinalysis w microscopic + reflex cultur   Generalized anxiety disorder (Chronic)   PHQ9/GAD7 reviewed. No SI/HI - Declines additional BH help at this time; feels like the majority of her frustrations and mood fluctuations are related to physical needs. Referrals placed to get her some extra help. Consider BH referral if needed.  Patient aware of signs/symptoms requiring further/urgent evaluation.       Cerebral palsy (HCC)   Chronic condition with mobility and balance difficulties affecting daily activities. - Refer for home health PT      Relevant Orders    AMB Referral VBCI Care Management   Vitamin D  deficiency   Supplement and monitor. Unsure if supplementing currently.       Relevant Orders   VITAMIN D  25 Hydroxy (Vit-D Deficiency, Fractures)   B12 deficiency   Inconsistent supplement use. Labs today.      Relevant Orders   B12 and Folate Panel   Iron deficiency   Inconsistent supplement use. Labs today.      Relevant Orders   IBC + Ferritin   Other Visit Diagnoses       Urinary frequency       Relevant Orders   Ambulatory referral to Urology   Ambulatory referral to Home Health   Urinalysis w microscopic + reflex cultur     Generalized weakness       Relevant Orders   Ambulatory referral to Home Health   AMB Referral VBCI Care Management         Follow-up: Return in about 6 months (around 01/18/2025) for chronic disease management, sooner pending results or if needed; BP check  with nurse 2-4 weeks.   Waddell FURY Almarie, DNP, FNP-C  I,Emily Lagle,acting as a neurosurgeon for Waddell KATHEE Almarie, NP.,have documented all relevant documentation on the behalf of Waddell KATHEE Almarie, NP.  I, Waddell KATHEE Almarie, NP, have reviewed all documentation for this visit. The documentation on 07/21/2024 for the exam, diagnosis, procedures, and orders are all accurate and complete. "

## 2024-07-21 ENCOUNTER — Encounter: Payer: Self-pay | Admitting: Family Medicine

## 2024-07-21 ENCOUNTER — Ambulatory Visit: Admitting: Family Medicine

## 2024-07-21 VITALS — BP 149/87 | HR 98 | Ht 60.0 in | Wt 195.0 lb

## 2024-07-21 DIAGNOSIS — R531 Weakness: Secondary | ICD-10-CM | POA: Diagnosis not present

## 2024-07-21 DIAGNOSIS — F411 Generalized anxiety disorder: Secondary | ICD-10-CM

## 2024-07-21 DIAGNOSIS — E611 Iron deficiency: Secondary | ICD-10-CM | POA: Diagnosis not present

## 2024-07-21 DIAGNOSIS — R32 Unspecified urinary incontinence: Secondary | ICD-10-CM

## 2024-07-21 DIAGNOSIS — I10 Essential (primary) hypertension: Secondary | ICD-10-CM

## 2024-07-21 DIAGNOSIS — E119 Type 2 diabetes mellitus without complications: Secondary | ICD-10-CM

## 2024-07-21 DIAGNOSIS — R350 Frequency of micturition: Secondary | ICD-10-CM

## 2024-07-21 DIAGNOSIS — E538 Deficiency of other specified B group vitamins: Secondary | ICD-10-CM | POA: Diagnosis not present

## 2024-07-21 DIAGNOSIS — Z794 Long term (current) use of insulin: Secondary | ICD-10-CM | POA: Diagnosis not present

## 2024-07-21 DIAGNOSIS — G809 Cerebral palsy, unspecified: Secondary | ICD-10-CM

## 2024-07-21 DIAGNOSIS — E559 Vitamin D deficiency, unspecified: Secondary | ICD-10-CM

## 2024-07-21 MED ORDER — LANTUS SOLOSTAR 100 UNIT/ML ~~LOC~~ SOPN: 8.0000 [IU] | PEN_INJECTOR | Freq: Every day | SUBCUTANEOUS | 99 refills | Status: AC

## 2024-07-21 NOTE — Assessment & Plan Note (Signed)
 Chronic condition with mobility and balance difficulties affecting daily activities. - Refer for home health PT

## 2024-07-21 NOTE — Assessment & Plan Note (Signed)
 A1c previously stable. Recheck today  Continue current medications - Lantus  8 units daily  Discussed diet and exercise. Continue monitoring glucose.

## 2024-07-21 NOTE — Assessment & Plan Note (Signed)
 Inconsistent supplement use. Labs today.

## 2024-07-21 NOTE — Assessment & Plan Note (Signed)
 Supplement and monitor. Unsure if supplementing currently.

## 2024-07-21 NOTE — Assessment & Plan Note (Signed)
 Experiencing incontinence and frequency, especially during work. No recent urology follow-up. - Check urine - Referred to urology for further evaluation and management.

## 2024-07-21 NOTE — Assessment & Plan Note (Signed)
 PHQ9/GAD7 reviewed. No SI/HI - Declines additional BH help at this time; feels like the majority of her frustrations and mood fluctuations are related to physical needs. Referrals placed to get her some extra help. Consider BH referral if needed.  Patient aware of signs/symptoms requiring further/urgent evaluation.

## 2024-07-21 NOTE — Assessment & Plan Note (Signed)
 Blood pressure is NOT at goal for age and co-morbidities.   Recommendations: continue losartan  - discussed compliance; nurse visit recheck in 2 weeks  - BP goal <130/80 - monitor and log blood pressures at home - check around the same time each day in a relaxed setting - Limit salt to <2000 mg/day - Follow DASH eating plan (heart healthy diet) - limit alcohol to 2 standard drinks per day for men and 1 per day for women - avoid tobacco products - get at least 2 hours of regular aerobic exercise weekly Patient aware of signs/symptoms requiring further/urgent evaluation. Labs updated today.

## 2024-07-22 LAB — BASIC METABOLIC PANEL WITH GFR
BUN: 14 mg/dL (ref 6–23)
CO2: 26 meq/L (ref 19–32)
Calcium: 9.3 mg/dL (ref 8.4–10.5)
Chloride: 106 meq/L (ref 96–112)
Creatinine, Ser: 0.51 mg/dL (ref 0.40–1.20)
GFR: 105.35 mL/min
Glucose, Bld: 95 mg/dL (ref 70–99)
Potassium: 3.7 meq/L (ref 3.5–5.1)
Sodium: 141 meq/L (ref 135–145)

## 2024-07-22 LAB — MICROALBUMIN / CREATININE URINE RATIO
Creatinine,U: 86.9 mg/dL
Microalb Creat Ratio: 24.5 mg/g (ref 0.0–30.0)
Microalb, Ur: 2.1 mg/dL — ABNORMAL HIGH (ref 0.7–1.9)

## 2024-07-22 LAB — HEMOGLOBIN A1C: Hgb A1c MFr Bld: 6.5 % (ref 4.6–6.5)

## 2024-07-22 LAB — IBC + FERRITIN
Ferritin: 19.5 ng/mL (ref 10.0–291.0)
Iron: 43 ug/dL (ref 42–145)
Saturation Ratios: 11.5 % — ABNORMAL LOW (ref 20.0–50.0)
TIBC: 375.2 ug/dL (ref 250.0–450.0)
Transferrin: 268 mg/dL (ref 212.0–360.0)

## 2024-07-22 LAB — B12 AND FOLATE PANEL
Folate: 16.4 ng/mL
Vitamin B-12: 243 pg/mL (ref 211–911)

## 2024-07-22 LAB — VITAMIN D 25 HYDROXY (VIT D DEFICIENCY, FRACTURES): VITD: 16.5 ng/mL — ABNORMAL LOW (ref 30.00–100.00)

## 2024-07-23 ENCOUNTER — Ambulatory Visit: Payer: Self-pay | Admitting: Family Medicine

## 2024-07-23 DIAGNOSIS — E559 Vitamin D deficiency, unspecified: Secondary | ICD-10-CM

## 2024-07-23 DIAGNOSIS — N3000 Acute cystitis without hematuria: Secondary | ICD-10-CM

## 2024-07-23 LAB — URINALYSIS W MICROSCOPIC + REFLEX CULTURE
Bilirubin Urine: NEGATIVE
Glucose, UA: NEGATIVE
Hgb urine dipstick: NEGATIVE
Ketones, ur: NEGATIVE
Nitrites, Initial: NEGATIVE
Protein, ur: NEGATIVE
Specific Gravity, Urine: 1.019 (ref 1.001–1.035)
pH: <=5 — AB (ref 5.0–8.0)

## 2024-07-23 LAB — CULTURE INDICATED

## 2024-07-23 LAB — URINE CULTURE
MICRO NUMBER:: 17488702
SPECIMEN QUALITY:: ADEQUATE

## 2024-07-23 MED ORDER — VITAMIN D (ERGOCALCIFEROL) 1.25 MG (50000 UNIT) PO CAPS: 50000.0000 [IU] | ORAL_CAPSULE | ORAL | 0 refills | Status: AC

## 2024-07-24 ENCOUNTER — Telehealth: Payer: Self-pay

## 2024-07-24 NOTE — Progress Notes (Signed)
 Complex Care Management Note Care Guide Note  07/24/2024 Name: Alicia Pena MRN: [id number] DOB: June 17, 1971   Complex Care Management Outreach Attempts: An unsuccessful telephone outreach was attempted today to offer the patient information about available complex care management services.  Follow Up Plan:  Additional outreach attempts will be made to offer the patient complex care management information and services.   Encounter Outcome:  No Answer  Dreama [facility]  [facility], [facility] VBCI Assistant Direct Dial: [phone]  Fax: [phone]

## 2024-07-25 MED ORDER — NITROFURANTOIN MONOHYD MACRO 100 MG PO CAPS: 100.0000 mg | ORAL_CAPSULE | Freq: Two times a day (BID) | ORAL | 0 refills | Status: AC

## 2024-07-28 NOTE — Progress Notes (Unsigned)
 Complex Care Management Note Care Guide Note  07/28/2024 Name: Alicia Pena MRN: [id number] DOB: [birth year]   Complex Care Management Outreach Attempts: A second unsuccessful outreach was attempted today to offer the patient with information about available complex care management services.  Follow Up Plan:  Additional outreach attempts will be made to offer the patient complex care management information and services.   Encounter Outcome:  No Answer  Dreama [facility]  [facility], [facility] VBCI Assistant Direct Dial: [phone]  Fax: [phone]

## 2024-07-30 NOTE — Progress Notes (Signed)
 Complex Care Management Note Care Guide Note  07/30/2024 Name: Alicia Pena MRN: [id number] DOB: Jun 10, 1971   Complex Care Management Outreach Attempts: A third unsuccessful outreach was attempted today to offer the patient with information about available complex care management services.  Follow Up Plan:  No further outreach attempts will be made at this time. We have been unable to contact the patient to offer or enroll patient in complex care management services.  Encounter Outcome:  No Answer  Dream[facility]  [facility], [facility]BCI Assistant Direct Dial: [phone]  Fax: [phone]

## 2024-08-04 ENCOUNTER — Ambulatory Visit

## 2024-08-11 ENCOUNTER — Ambulatory Visit

## 2024-09-01 ENCOUNTER — Ambulatory Visit: Admitting: Urology

## 2025-01-19 ENCOUNTER — Ambulatory Visit: Admitting: Family Medicine
